# Patient Record
Sex: Female | Born: 1935 | Race: White | Hispanic: No | Smoking: Never smoker
Health system: Southern US, Community
[De-identification: ages and names within clinical notes are randomized; demographics above are authoritative.]

## PROBLEM LIST (undated history)

## (undated) DIAGNOSIS — N189 Chronic kidney disease, unspecified: Secondary | ICD-10-CM

## (undated) DIAGNOSIS — I4821 Permanent atrial fibrillation: Secondary | ICD-10-CM

## (undated) DIAGNOSIS — N184 Chronic kidney disease, stage 4 (severe): Secondary | ICD-10-CM

## (undated) DIAGNOSIS — F32A Depression, unspecified: Secondary | ICD-10-CM

## (undated) DIAGNOSIS — C439 Malignant melanoma of skin, unspecified: Secondary | ICD-10-CM

## (undated) DIAGNOSIS — J189 Pneumonia, unspecified organism: Secondary | ICD-10-CM

## (undated) DIAGNOSIS — I209 Angina pectoris, unspecified: Secondary | ICD-10-CM

## (undated) DIAGNOSIS — I10 Essential (primary) hypertension: Secondary | ICD-10-CM

## (undated) DIAGNOSIS — M109 Gout, unspecified: Secondary | ICD-10-CM

## (undated) DIAGNOSIS — I639 Cerebral infarction, unspecified: Secondary | ICD-10-CM

## (undated) DIAGNOSIS — I872 Venous insufficiency (chronic) (peripheral): Secondary | ICD-10-CM

## (undated) DIAGNOSIS — E1140 Type 2 diabetes mellitus with diabetic neuropathy, unspecified: Secondary | ICD-10-CM

## (undated) DIAGNOSIS — R32 Unspecified urinary incontinence: Secondary | ICD-10-CM

## (undated) DIAGNOSIS — I2510 Atherosclerotic heart disease of native coronary artery without angina pectoris: Secondary | ICD-10-CM

## (undated) DIAGNOSIS — R296 Repeated falls: Secondary | ICD-10-CM

## (undated) DIAGNOSIS — Z9289 Personal history of other medical treatment: Secondary | ICD-10-CM

## (undated) DIAGNOSIS — N179 Acute kidney failure, unspecified: Secondary | ICD-10-CM

## (undated) DIAGNOSIS — Z950 Presence of cardiac pacemaker: Secondary | ICD-10-CM

## (undated) DIAGNOSIS — H35342 Macular cyst, hole, or pseudohole, left eye: Secondary | ICD-10-CM

## (undated) DIAGNOSIS — I428 Other cardiomyopathies: Secondary | ICD-10-CM

## (undated) DIAGNOSIS — I509 Heart failure, unspecified: Secondary | ICD-10-CM

## (undated) DIAGNOSIS — I219 Acute myocardial infarction, unspecified: Secondary | ICD-10-CM

## (undated) DIAGNOSIS — D649 Anemia, unspecified: Secondary | ICD-10-CM

## (undated) DIAGNOSIS — R011 Cardiac murmur, unspecified: Secondary | ICD-10-CM

## (undated) DIAGNOSIS — I4891 Unspecified atrial fibrillation: Secondary | ICD-10-CM

## (undated) DIAGNOSIS — M1990 Unspecified osteoarthritis, unspecified site: Secondary | ICD-10-CM

## (undated) DIAGNOSIS — M419 Scoliosis, unspecified: Secondary | ICD-10-CM

## (undated) DIAGNOSIS — E119 Type 2 diabetes mellitus without complications: Secondary | ICD-10-CM

## (undated) DIAGNOSIS — E785 Hyperlipidemia, unspecified: Secondary | ICD-10-CM

## (undated) DIAGNOSIS — R001 Bradycardia, unspecified: Secondary | ICD-10-CM

## (undated) DIAGNOSIS — F329 Major depressive disorder, single episode, unspecified: Secondary | ICD-10-CM

## (undated) HISTORY — DX: Essential (primary) hypertension: I10

## (undated) HISTORY — DX: Major depressive disorder, single episode, unspecified: F32.9

## (undated) HISTORY — PX: TUBAL LIGATION: SHX77

## (undated) HISTORY — DX: Repeated falls: R29.6

## (undated) HISTORY — DX: Anemia, unspecified: D64.9

## (undated) HISTORY — PX: TONSILLECTOMY: SUR1361

## (undated) HISTORY — DX: Atherosclerotic heart disease of native coronary artery without angina pectoris: I25.10

## (undated) HISTORY — PX: CARDIAC CATHETERIZATION: SHX172

## (undated) HISTORY — DX: Unspecified atrial fibrillation: I48.91

## (undated) HISTORY — DX: Bradycardia, unspecified: R00.1

## (undated) HISTORY — DX: Unspecified urinary incontinence: R32

## (undated) HISTORY — DX: Venous insufficiency (chronic) (peripheral): I87.2

## (undated) HISTORY — PX: BLADDER SUSPENSION: SHX72

## (undated) HISTORY — DX: Heart failure, unspecified: I50.9

## (undated) HISTORY — DX: Chronic kidney disease, unspecified: N18.9

## (undated) HISTORY — DX: Scoliosis, unspecified: M41.9

## (undated) HISTORY — DX: Other cardiomyopathies: I42.8

## (undated) HISTORY — PX: PARS PLANA VITRECTOMY W/ REPAIR OF MACULAR HOLE: SHX2170

## (undated) HISTORY — DX: Unspecified osteoarthritis, unspecified site: M19.90

## (undated) HISTORY — DX: Hyperlipidemia, unspecified: E78.5

## (undated) HISTORY — DX: Depression, unspecified: F32.A

## (undated) HISTORY — DX: Permanent atrial fibrillation: I48.21

## (undated) HISTORY — PX: INSERT / REPLACE / REMOVE PACEMAKER: SUR710

## (undated) HISTORY — PX: CATARACT EXTRACTION W/ INTRAOCULAR LENS  IMPLANT, BILATERAL: SHX1307

## (undated) HISTORY — PX: TOTAL ABDOMINAL HYSTERECTOMY: SHX209

## (undated) HISTORY — DX: Macular cyst, hole, or pseudohole, left eye: H35.342

## (undated) HISTORY — PX: MELANOMA EXCISION: SHX5266

## (undated) HISTORY — PX: APPENDECTOMY: SHX54

## (undated) HISTORY — PX: ANKLE FRACTURE SURGERY: SHX122

## (undated) HISTORY — DX: Type 2 diabetes mellitus without complications: E11.9

---

## 2001-07-28 ENCOUNTER — Encounter: Admission: RE | Admit: 2001-07-28 | Discharge: 2001-07-28 | Payer: Self-pay | Admitting: Family Medicine

## 2001-08-12 ENCOUNTER — Encounter: Admission: RE | Admit: 2001-08-12 | Discharge: 2001-08-12 | Payer: Self-pay | Admitting: Family Medicine

## 2001-08-18 ENCOUNTER — Encounter: Admission: RE | Admit: 2001-08-18 | Discharge: 2001-08-18 | Payer: Self-pay | Admitting: Sports Medicine

## 2001-08-26 ENCOUNTER — Encounter: Admission: RE | Admit: 2001-08-26 | Discharge: 2001-08-26 | Payer: Self-pay | Admitting: Family Medicine

## 2001-08-26 ENCOUNTER — Encounter: Payer: Self-pay | Admitting: Family Medicine

## 2001-09-08 ENCOUNTER — Encounter: Admission: RE | Admit: 2001-09-08 | Discharge: 2001-09-08 | Payer: Self-pay | Admitting: Family Medicine

## 2001-09-08 ENCOUNTER — Ambulatory Visit (HOSPITAL_COMMUNITY): Admission: RE | Admit: 2001-09-08 | Discharge: 2001-09-08 | Payer: Self-pay | Admitting: Family Medicine

## 2001-09-29 ENCOUNTER — Encounter: Admission: RE | Admit: 2001-09-29 | Discharge: 2001-09-29 | Payer: Self-pay | Admitting: Family Medicine

## 2001-11-07 ENCOUNTER — Encounter: Admission: RE | Admit: 2001-11-07 | Discharge: 2001-11-07 | Payer: Self-pay | Admitting: Family Medicine

## 2001-11-14 ENCOUNTER — Encounter: Admission: RE | Admit: 2001-11-14 | Discharge: 2001-11-14 | Payer: Self-pay | Admitting: Family Medicine

## 2001-11-19 ENCOUNTER — Inpatient Hospital Stay (HOSPITAL_COMMUNITY): Admission: EM | Admit: 2001-11-19 | Discharge: 2001-11-20 | Payer: Self-pay | Admitting: Emergency Medicine

## 2001-11-19 ENCOUNTER — Encounter: Payer: Self-pay | Admitting: Emergency Medicine

## 2001-11-20 ENCOUNTER — Encounter: Payer: Self-pay | Admitting: Internal Medicine

## 2001-11-24 ENCOUNTER — Encounter: Admission: RE | Admit: 2001-11-24 | Discharge: 2001-11-24 | Payer: Self-pay | Admitting: Family Medicine

## 2001-12-02 ENCOUNTER — Encounter: Admission: RE | Admit: 2001-12-02 | Discharge: 2001-12-02 | Payer: Self-pay | Admitting: Family Medicine

## 2001-12-08 ENCOUNTER — Encounter: Admission: RE | Admit: 2001-12-08 | Discharge: 2001-12-08 | Payer: Self-pay | Admitting: Family Medicine

## 2001-12-22 ENCOUNTER — Encounter: Admission: RE | Admit: 2001-12-22 | Discharge: 2001-12-22 | Payer: Self-pay | Admitting: Family Medicine

## 2002-01-06 ENCOUNTER — Encounter: Admission: RE | Admit: 2002-01-06 | Discharge: 2002-01-06 | Payer: Self-pay | Admitting: Family Medicine

## 2002-01-19 ENCOUNTER — Encounter: Admission: RE | Admit: 2002-01-19 | Discharge: 2002-01-19 | Payer: Self-pay | Admitting: Family Medicine

## 2002-02-16 ENCOUNTER — Encounter: Admission: RE | Admit: 2002-02-16 | Discharge: 2002-02-16 | Payer: Self-pay | Admitting: Family Medicine

## 2002-03-19 ENCOUNTER — Encounter: Admission: RE | Admit: 2002-03-19 | Discharge: 2002-03-19 | Payer: Self-pay | Admitting: Family Medicine

## 2002-03-27 ENCOUNTER — Encounter: Admission: RE | Admit: 2002-03-27 | Discharge: 2002-03-27 | Payer: Self-pay | Admitting: Family Medicine

## 2002-04-09 ENCOUNTER — Encounter: Admission: RE | Admit: 2002-04-09 | Discharge: 2002-04-09 | Payer: Self-pay | Admitting: Family Medicine

## 2002-04-20 ENCOUNTER — Encounter: Admission: RE | Admit: 2002-04-20 | Discharge: 2002-04-20 | Payer: Self-pay | Admitting: Family Medicine

## 2002-05-08 ENCOUNTER — Encounter: Admission: RE | Admit: 2002-05-08 | Discharge: 2002-05-08 | Payer: Self-pay | Admitting: Family Medicine

## 2002-06-08 ENCOUNTER — Encounter: Admission: RE | Admit: 2002-06-08 | Discharge: 2002-06-08 | Payer: Self-pay | Admitting: Family Medicine

## 2002-06-24 ENCOUNTER — Encounter: Admission: RE | Admit: 2002-06-24 | Discharge: 2002-06-24 | Payer: Self-pay | Admitting: Family Medicine

## 2002-07-09 ENCOUNTER — Encounter: Admission: RE | Admit: 2002-07-09 | Discharge: 2002-07-09 | Payer: Self-pay | Admitting: Family Medicine

## 2002-07-14 ENCOUNTER — Encounter: Payer: Self-pay | Admitting: Emergency Medicine

## 2002-07-14 ENCOUNTER — Inpatient Hospital Stay (HOSPITAL_COMMUNITY): Admission: EM | Admit: 2002-07-14 | Discharge: 2002-07-15 | Payer: Self-pay | Admitting: Emergency Medicine

## 2002-07-14 ENCOUNTER — Encounter: Payer: Self-pay | Admitting: Cardiology

## 2002-07-23 ENCOUNTER — Encounter: Admission: RE | Admit: 2002-07-23 | Discharge: 2002-07-23 | Payer: Self-pay | Admitting: Family Medicine

## 2002-07-30 ENCOUNTER — Encounter: Admission: RE | Admit: 2002-07-30 | Discharge: 2002-07-30 | Payer: Self-pay | Admitting: Family Medicine

## 2002-08-28 ENCOUNTER — Encounter: Admission: RE | Admit: 2002-08-28 | Discharge: 2002-08-28 | Payer: Self-pay | Admitting: Family Medicine

## 2002-09-11 ENCOUNTER — Encounter: Admission: RE | Admit: 2002-09-11 | Discharge: 2002-09-11 | Payer: Self-pay | Admitting: Family Medicine

## 2002-09-28 ENCOUNTER — Encounter: Admission: RE | Admit: 2002-09-28 | Discharge: 2002-09-28 | Payer: Self-pay | Admitting: Family Medicine

## 2002-10-13 ENCOUNTER — Encounter: Payer: Self-pay | Admitting: Sports Medicine

## 2002-10-13 ENCOUNTER — Encounter: Admission: RE | Admit: 2002-10-13 | Discharge: 2002-10-13 | Payer: Self-pay | Admitting: Family Medicine

## 2002-10-13 ENCOUNTER — Encounter: Admission: RE | Admit: 2002-10-13 | Discharge: 2002-10-13 | Payer: Self-pay | Admitting: Sports Medicine

## 2002-10-26 ENCOUNTER — Encounter: Admission: RE | Admit: 2002-10-26 | Discharge: 2002-10-26 | Payer: Self-pay | Admitting: Family Medicine

## 2002-11-02 ENCOUNTER — Encounter: Admission: RE | Admit: 2002-11-02 | Discharge: 2002-11-02 | Payer: Self-pay | Admitting: Family Medicine

## 2002-11-12 ENCOUNTER — Encounter: Admission: RE | Admit: 2002-11-12 | Discharge: 2002-11-12 | Payer: Self-pay | Admitting: Sports Medicine

## 2002-12-21 ENCOUNTER — Encounter: Admission: RE | Admit: 2002-12-21 | Discharge: 2002-12-21 | Payer: Self-pay | Admitting: Sports Medicine

## 2002-12-28 ENCOUNTER — Encounter: Admission: RE | Admit: 2002-12-28 | Discharge: 2002-12-28 | Payer: Self-pay | Admitting: Family Medicine

## 2003-01-04 ENCOUNTER — Encounter: Admission: RE | Admit: 2003-01-04 | Discharge: 2003-01-04 | Payer: Self-pay | Admitting: Family Medicine

## 2003-01-18 ENCOUNTER — Encounter: Admission: RE | Admit: 2003-01-18 | Discharge: 2003-01-18 | Payer: Self-pay | Admitting: Family Medicine

## 2003-02-03 ENCOUNTER — Encounter: Admission: RE | Admit: 2003-02-03 | Discharge: 2003-02-03 | Payer: Self-pay | Admitting: Sports Medicine

## 2003-02-03 ENCOUNTER — Encounter: Admission: RE | Admit: 2003-02-03 | Discharge: 2003-02-03 | Payer: Self-pay | Admitting: Family Medicine

## 2003-02-17 ENCOUNTER — Encounter: Admission: RE | Admit: 2003-02-17 | Discharge: 2003-02-17 | Payer: Self-pay | Admitting: Family Medicine

## 2003-03-01 ENCOUNTER — Encounter: Admission: RE | Admit: 2003-03-01 | Discharge: 2003-03-01 | Payer: Self-pay | Admitting: Family Medicine

## 2003-03-03 ENCOUNTER — Encounter: Admission: RE | Admit: 2003-03-03 | Discharge: 2003-03-03 | Payer: Self-pay | Admitting: Family Medicine

## 2003-03-12 ENCOUNTER — Encounter: Admission: RE | Admit: 2003-03-12 | Discharge: 2003-03-12 | Payer: Self-pay | Admitting: Family Medicine

## 2003-03-19 ENCOUNTER — Encounter: Admission: RE | Admit: 2003-03-19 | Discharge: 2003-03-19 | Payer: Self-pay | Admitting: Family Medicine

## 2003-04-02 ENCOUNTER — Encounter: Admission: RE | Admit: 2003-04-02 | Discharge: 2003-04-02 | Payer: Self-pay | Admitting: Family Medicine

## 2003-04-16 ENCOUNTER — Encounter: Admission: RE | Admit: 2003-04-16 | Discharge: 2003-04-16 | Payer: Self-pay | Admitting: Family Medicine

## 2003-04-30 ENCOUNTER — Encounter: Admission: RE | Admit: 2003-04-30 | Discharge: 2003-04-30 | Payer: Self-pay | Admitting: Sports Medicine

## 2003-05-14 ENCOUNTER — Encounter: Admission: RE | Admit: 2003-05-14 | Discharge: 2003-05-14 | Payer: Self-pay | Admitting: Family Medicine

## 2003-06-14 ENCOUNTER — Encounter: Admission: RE | Admit: 2003-06-14 | Discharge: 2003-06-14 | Payer: Self-pay | Admitting: Family Medicine

## 2003-06-18 ENCOUNTER — Encounter: Admission: RE | Admit: 2003-06-18 | Discharge: 2003-06-18 | Payer: Self-pay | Admitting: Family Medicine

## 2003-07-07 ENCOUNTER — Inpatient Hospital Stay (HOSPITAL_COMMUNITY): Admission: EM | Admit: 2003-07-07 | Discharge: 2003-07-08 | Payer: Self-pay | Admitting: Emergency Medicine

## 2003-07-19 ENCOUNTER — Encounter: Admission: RE | Admit: 2003-07-19 | Discharge: 2003-07-19 | Payer: Self-pay | Admitting: Sports Medicine

## 2003-08-02 ENCOUNTER — Encounter: Admission: RE | Admit: 2003-08-02 | Discharge: 2003-08-02 | Payer: Self-pay | Admitting: Family Medicine

## 2003-08-17 ENCOUNTER — Encounter: Admission: RE | Admit: 2003-08-17 | Discharge: 2003-08-17 | Payer: Self-pay | Admitting: Sports Medicine

## 2003-08-27 ENCOUNTER — Encounter: Admission: RE | Admit: 2003-08-27 | Discharge: 2003-08-27 | Payer: Self-pay | Admitting: Family Medicine

## 2003-09-10 ENCOUNTER — Encounter: Admission: RE | Admit: 2003-09-10 | Discharge: 2003-09-10 | Payer: Self-pay | Admitting: Family Medicine

## 2003-09-24 ENCOUNTER — Encounter: Admission: RE | Admit: 2003-09-24 | Discharge: 2003-09-24 | Payer: Self-pay | Admitting: Family Medicine

## 2003-10-08 ENCOUNTER — Encounter: Admission: RE | Admit: 2003-10-08 | Discharge: 2003-10-08 | Payer: Self-pay | Admitting: Family Medicine

## 2003-10-22 ENCOUNTER — Ambulatory Visit: Payer: Self-pay | Admitting: Family Medicine

## 2003-11-01 ENCOUNTER — Ambulatory Visit: Payer: Self-pay | Admitting: Family Medicine

## 2003-11-22 ENCOUNTER — Ambulatory Visit: Payer: Self-pay | Admitting: Sports Medicine

## 2003-11-29 ENCOUNTER — Ambulatory Visit: Payer: Self-pay | Admitting: Family Medicine

## 2003-12-06 ENCOUNTER — Ambulatory Visit: Payer: Self-pay | Admitting: Family Medicine

## 2003-12-13 ENCOUNTER — Ambulatory Visit: Payer: Self-pay | Admitting: Family Medicine

## 2003-12-27 ENCOUNTER — Ambulatory Visit: Payer: Self-pay | Admitting: Family Medicine

## 2004-01-10 ENCOUNTER — Ambulatory Visit: Payer: Self-pay | Admitting: Family Medicine

## 2004-01-12 ENCOUNTER — Ambulatory Visit: Payer: Self-pay

## 2004-01-19 ENCOUNTER — Ambulatory Visit: Payer: Self-pay | Admitting: Family Medicine

## 2004-01-19 ENCOUNTER — Ambulatory Visit: Payer: Self-pay | Admitting: Internal Medicine

## 2004-02-01 ENCOUNTER — Ambulatory Visit: Payer: Self-pay | Admitting: Family Medicine

## 2004-02-15 ENCOUNTER — Ambulatory Visit: Payer: Self-pay | Admitting: Family Medicine

## 2004-03-03 ENCOUNTER — Ambulatory Visit: Payer: Self-pay | Admitting: Sports Medicine

## 2004-03-21 ENCOUNTER — Ambulatory Visit: Payer: Self-pay | Admitting: Cardiology

## 2004-03-22 ENCOUNTER — Ambulatory Visit: Payer: Self-pay | Admitting: Cardiology

## 2004-04-05 ENCOUNTER — Ambulatory Visit: Payer: Self-pay | Admitting: Sports Medicine

## 2004-05-05 ENCOUNTER — Ambulatory Visit: Payer: Self-pay | Admitting: Family Medicine

## 2004-05-08 ENCOUNTER — Ambulatory Visit: Payer: Self-pay | Admitting: Cardiology

## 2004-05-23 ENCOUNTER — Encounter: Admission: RE | Admit: 2004-05-23 | Discharge: 2004-05-23 | Payer: Self-pay | Admitting: Family Medicine

## 2004-06-02 ENCOUNTER — Ambulatory Visit: Payer: Self-pay | Admitting: Cardiology

## 2004-06-05 ENCOUNTER — Ambulatory Visit: Payer: Self-pay | Admitting: Family Medicine

## 2004-06-19 ENCOUNTER — Ambulatory Visit: Payer: Self-pay | Admitting: Family Medicine

## 2004-07-03 ENCOUNTER — Ambulatory Visit: Payer: Self-pay | Admitting: Family Medicine

## 2004-07-17 ENCOUNTER — Ambulatory Visit: Payer: Self-pay | Admitting: Sports Medicine

## 2004-07-18 ENCOUNTER — Ambulatory Visit: Payer: Self-pay | Admitting: Cardiology

## 2004-07-31 ENCOUNTER — Ambulatory Visit: Payer: Self-pay | Admitting: Sports Medicine

## 2004-08-11 ENCOUNTER — Ambulatory Visit: Payer: Self-pay | Admitting: Cardiology

## 2004-08-14 ENCOUNTER — Encounter: Admission: RE | Admit: 2004-08-14 | Discharge: 2004-08-14 | Payer: Self-pay | Admitting: Family Medicine

## 2004-08-14 ENCOUNTER — Ambulatory Visit: Payer: Self-pay | Admitting: Family Medicine

## 2004-08-21 ENCOUNTER — Ambulatory Visit: Payer: Self-pay

## 2004-09-11 ENCOUNTER — Ambulatory Visit: Payer: Self-pay | Admitting: Sports Medicine

## 2004-09-13 ENCOUNTER — Ambulatory Visit: Payer: Self-pay | Admitting: Cardiology

## 2004-09-25 ENCOUNTER — Ambulatory Visit: Payer: Self-pay | Admitting: Family Medicine

## 2004-10-09 ENCOUNTER — Ambulatory Visit: Payer: Self-pay | Admitting: Sports Medicine

## 2004-10-18 ENCOUNTER — Ambulatory Visit: Payer: Self-pay | Admitting: Cardiology

## 2004-10-30 ENCOUNTER — Ambulatory Visit: Payer: Self-pay | Admitting: Sports Medicine

## 2004-11-10 ENCOUNTER — Ambulatory Visit: Payer: Self-pay | Admitting: Family Medicine

## 2004-11-15 ENCOUNTER — Ambulatory Visit: Payer: Self-pay | Admitting: Cardiology

## 2004-12-07 ENCOUNTER — Ambulatory Visit: Payer: Self-pay | Admitting: Family Medicine

## 2004-12-14 ENCOUNTER — Ambulatory Visit: Payer: Self-pay | Admitting: Cardiology

## 2004-12-22 ENCOUNTER — Ambulatory Visit: Payer: Self-pay | Admitting: Family Medicine

## 2005-01-16 ENCOUNTER — Ambulatory Visit: Payer: Self-pay | Admitting: Cardiology

## 2005-01-16 ENCOUNTER — Ambulatory Visit: Payer: Self-pay | Admitting: Sports Medicine

## 2005-01-30 ENCOUNTER — Ambulatory Visit: Payer: Self-pay | Admitting: Sports Medicine

## 2005-02-19 ENCOUNTER — Ambulatory Visit: Payer: Self-pay | Admitting: Cardiology

## 2005-02-27 ENCOUNTER — Ambulatory Visit: Payer: Self-pay | Admitting: Family Medicine

## 2005-03-13 ENCOUNTER — Ambulatory Visit: Payer: Self-pay | Admitting: Sports Medicine

## 2005-03-27 ENCOUNTER — Ambulatory Visit: Payer: Self-pay | Admitting: Cardiology

## 2005-04-10 ENCOUNTER — Ambulatory Visit: Payer: Self-pay | Admitting: Sports Medicine

## 2005-04-25 ENCOUNTER — Ambulatory Visit: Payer: Self-pay | Admitting: Cardiology

## 2005-04-25 ENCOUNTER — Ambulatory Visit: Payer: Self-pay

## 2005-04-25 ENCOUNTER — Inpatient Hospital Stay (HOSPITAL_COMMUNITY): Admission: AD | Admit: 2005-04-25 | Discharge: 2005-04-28 | Payer: Self-pay | Admitting: Cardiology

## 2005-05-09 ENCOUNTER — Ambulatory Visit: Payer: Self-pay

## 2005-05-09 ENCOUNTER — Ambulatory Visit: Payer: Self-pay | Admitting: Cardiology

## 2005-05-21 ENCOUNTER — Ambulatory Visit: Payer: Self-pay | Admitting: Family Medicine

## 2005-06-25 ENCOUNTER — Ambulatory Visit: Payer: Self-pay | Admitting: Sports Medicine

## 2005-07-10 ENCOUNTER — Ambulatory Visit: Payer: Self-pay | Admitting: Sports Medicine

## 2005-07-24 ENCOUNTER — Ambulatory Visit: Payer: Self-pay | Admitting: Sports Medicine

## 2005-07-27 ENCOUNTER — Inpatient Hospital Stay (HOSPITAL_COMMUNITY): Admission: EM | Admit: 2005-07-27 | Discharge: 2005-08-03 | Payer: Self-pay | Admitting: *Deleted

## 2005-07-27 ENCOUNTER — Ambulatory Visit: Payer: Self-pay | Admitting: Cardiovascular Disease

## 2005-07-31 ENCOUNTER — Encounter (INDEPENDENT_AMBULATORY_CARE_PROVIDER_SITE_OTHER): Payer: Self-pay | Admitting: *Deleted

## 2005-08-06 ENCOUNTER — Ambulatory Visit: Payer: Self-pay | Admitting: Cardiology

## 2005-08-08 ENCOUNTER — Ambulatory Visit: Payer: Self-pay | Admitting: Cardiology

## 2005-08-10 ENCOUNTER — Ambulatory Visit: Payer: Self-pay | Admitting: Family Medicine

## 2005-08-14 ENCOUNTER — Ambulatory Visit: Payer: Self-pay | Admitting: Sports Medicine

## 2005-08-17 ENCOUNTER — Ambulatory Visit: Payer: Self-pay | Admitting: Family Medicine

## 2005-08-24 ENCOUNTER — Ambulatory Visit: Payer: Self-pay | Admitting: Family Medicine

## 2005-09-07 ENCOUNTER — Ambulatory Visit: Payer: Self-pay | Admitting: Cardiology

## 2005-09-14 ENCOUNTER — Ambulatory Visit: Payer: Self-pay | Admitting: Internal Medicine

## 2005-09-14 ENCOUNTER — Ambulatory Visit: Payer: Self-pay | Admitting: Cardiology

## 2005-09-14 ENCOUNTER — Ambulatory Visit: Payer: Self-pay | Admitting: Sports Medicine

## 2005-09-17 ENCOUNTER — Ambulatory Visit: Payer: Self-pay | Admitting: Family Medicine

## 2005-09-24 ENCOUNTER — Ambulatory Visit: Payer: Self-pay | Admitting: Family Medicine

## 2005-10-02 ENCOUNTER — Ambulatory Visit: Payer: Self-pay | Admitting: Sports Medicine

## 2005-10-03 ENCOUNTER — Ambulatory Visit: Payer: Self-pay | Admitting: Family Medicine

## 2005-10-03 ENCOUNTER — Encounter: Admission: RE | Admit: 2005-10-03 | Discharge: 2005-10-03 | Payer: Self-pay | Admitting: Sports Medicine

## 2005-10-05 ENCOUNTER — Ambulatory Visit: Payer: Self-pay | Admitting: Cardiology

## 2005-10-26 ENCOUNTER — Ambulatory Visit: Payer: Self-pay | Admitting: Family Medicine

## 2005-11-09 ENCOUNTER — Ambulatory Visit: Payer: Self-pay | Admitting: Family Medicine

## 2005-11-23 ENCOUNTER — Ambulatory Visit: Payer: Self-pay | Admitting: Family Medicine

## 2005-11-30 ENCOUNTER — Ambulatory Visit: Payer: Self-pay | Admitting: Family Medicine

## 2005-12-10 ENCOUNTER — Ambulatory Visit: Payer: Self-pay | Admitting: Cardiology

## 2005-12-10 ENCOUNTER — Encounter: Admission: RE | Admit: 2005-12-10 | Discharge: 2005-12-10 | Payer: Self-pay | Admitting: Sports Medicine

## 2005-12-10 ENCOUNTER — Ambulatory Visit: Payer: Self-pay | Admitting: Family Medicine

## 2005-12-11 ENCOUNTER — Ambulatory Visit: Payer: Self-pay | Admitting: Family Medicine

## 2005-12-24 ENCOUNTER — Ambulatory Visit: Payer: Self-pay | Admitting: Family Medicine

## 2005-12-27 ENCOUNTER — Ambulatory Visit: Payer: Self-pay | Admitting: Sports Medicine

## 2006-01-02 ENCOUNTER — Ambulatory Visit: Payer: Self-pay | Admitting: Family Medicine

## 2006-01-07 ENCOUNTER — Ambulatory Visit: Payer: Self-pay | Admitting: Family Medicine

## 2006-01-11 ENCOUNTER — Ambulatory Visit: Payer: Self-pay | Admitting: Internal Medicine

## 2006-01-16 ENCOUNTER — Ambulatory Visit: Payer: Self-pay | Admitting: Family Medicine

## 2006-01-30 ENCOUNTER — Ambulatory Visit: Payer: Self-pay | Admitting: Family Medicine

## 2006-02-13 ENCOUNTER — Ambulatory Visit: Payer: Self-pay | Admitting: Sports Medicine

## 2006-02-27 ENCOUNTER — Ambulatory Visit: Payer: Self-pay | Admitting: Sports Medicine

## 2006-03-04 ENCOUNTER — Ambulatory Visit: Payer: Self-pay | Admitting: Cardiology

## 2006-03-13 ENCOUNTER — Ambulatory Visit: Payer: Self-pay | Admitting: Family Medicine

## 2006-03-27 ENCOUNTER — Ambulatory Visit: Payer: Self-pay | Admitting: Family Medicine

## 2006-04-03 ENCOUNTER — Ambulatory Visit: Payer: Self-pay | Admitting: Family Medicine

## 2006-04-11 DIAGNOSIS — I8310 Varicose veins of unspecified lower extremity with inflammation: Secondary | ICD-10-CM

## 2006-04-11 DIAGNOSIS — R32 Unspecified urinary incontinence: Secondary | ICD-10-CM

## 2006-04-11 DIAGNOSIS — E7800 Pure hypercholesterolemia, unspecified: Secondary | ICD-10-CM

## 2006-04-11 DIAGNOSIS — I252 Old myocardial infarction: Secondary | ICD-10-CM

## 2006-04-11 DIAGNOSIS — I5022 Chronic systolic (congestive) heart failure: Secondary | ICD-10-CM

## 2006-04-11 DIAGNOSIS — I6789 Other cerebrovascular disease: Secondary | ICD-10-CM

## 2006-04-11 DIAGNOSIS — M1990 Unspecified osteoarthritis, unspecified site: Secondary | ICD-10-CM

## 2006-04-11 DIAGNOSIS — I10 Essential (primary) hypertension: Secondary | ICD-10-CM | POA: Insufficient documentation

## 2006-04-11 DIAGNOSIS — I2510 Atherosclerotic heart disease of native coronary artery without angina pectoris: Secondary | ICD-10-CM | POA: Insufficient documentation

## 2006-04-11 DIAGNOSIS — E118 Type 2 diabetes mellitus with unspecified complications: Secondary | ICD-10-CM | POA: Insufficient documentation

## 2006-04-11 DIAGNOSIS — E781 Pure hyperglyceridemia: Secondary | ICD-10-CM

## 2006-04-11 DIAGNOSIS — F329 Major depressive disorder, single episode, unspecified: Secondary | ICD-10-CM

## 2006-04-18 ENCOUNTER — Ambulatory Visit: Payer: Self-pay | Admitting: Family Medicine

## 2006-04-18 DIAGNOSIS — I4891 Unspecified atrial fibrillation: Secondary | ICD-10-CM | POA: Insufficient documentation

## 2006-05-02 ENCOUNTER — Ambulatory Visit: Payer: Self-pay | Admitting: Family Medicine

## 2006-05-02 LAB — CONVERTED CEMR LAB
INR: 2.8
Prothrombin Time: 34.1 s

## 2006-05-17 ENCOUNTER — Ambulatory Visit: Payer: Self-pay | Admitting: Family Medicine

## 2006-05-17 DIAGNOSIS — M5137 Other intervertebral disc degeneration, lumbosacral region: Secondary | ICD-10-CM

## 2006-05-17 DIAGNOSIS — M4120 Other idiopathic scoliosis, site unspecified: Secondary | ICD-10-CM | POA: Insufficient documentation

## 2006-05-17 LAB — CONVERTED CEMR LAB
BUN: 20 mg/dL (ref 6–23)
Calcium: 9.4 mg/dL (ref 8.4–10.5)
Glucose, Bld: 88 mg/dL (ref 70–99)
Hgb A1c MFr Bld: 6.2 %
INR: 2.9
Potassium: 3.4 meq/L — ABNORMAL LOW (ref 3.5–5.3)
Sodium: 145 meq/L (ref 135–145)

## 2006-05-27 ENCOUNTER — Ambulatory Visit: Payer: Self-pay | Admitting: Cardiology

## 2006-05-28 ENCOUNTER — Telehealth (INDEPENDENT_AMBULATORY_CARE_PROVIDER_SITE_OTHER): Payer: Self-pay | Admitting: *Deleted

## 2006-06-13 ENCOUNTER — Encounter: Payer: Self-pay | Admitting: Family Medicine

## 2006-06-13 ENCOUNTER — Ambulatory Visit: Payer: Self-pay | Admitting: Sports Medicine

## 2006-06-13 DIAGNOSIS — D638 Anemia in other chronic diseases classified elsewhere: Secondary | ICD-10-CM

## 2006-06-14 ENCOUNTER — Ambulatory Visit: Payer: Self-pay | Admitting: Cardiology

## 2006-06-14 ENCOUNTER — Ambulatory Visit (HOSPITAL_COMMUNITY): Admission: RE | Admit: 2006-06-14 | Discharge: 2006-06-14 | Payer: Self-pay | Admitting: Family Medicine

## 2006-06-14 ENCOUNTER — Telehealth: Payer: Self-pay | Admitting: Family Medicine

## 2006-06-14 ENCOUNTER — Encounter (INDEPENDENT_AMBULATORY_CARE_PROVIDER_SITE_OTHER): Payer: Self-pay | Admitting: *Deleted

## 2006-06-14 LAB — CONVERTED CEMR LAB
BUN: 21 mg/dL (ref 6–23)
CO2: 19 meq/L (ref 19–32)
Calcium: 8.8 mg/dL (ref 8.4–10.5)
Digitoxin Lvl: 1.5 ng/mL (ref 0.8–2.0)
Hemoglobin: 8.6 g/dL — ABNORMAL LOW (ref 12.0–15.0)
MCHC: 30 g/dL (ref 30.0–36.0)
MCV: 88.3 fL (ref 78.0–100.0)
Platelets: 200 10*3/uL (ref 150–400)
Potassium: 3.9 meq/L (ref 3.5–5.3)
RBC: 3.25 M/uL — ABNORMAL LOW (ref 3.87–5.11)
RDW: 16.1 % — ABNORMAL HIGH (ref 11.5–14.0)
Sodium: 144 meq/L (ref 135–145)

## 2006-06-18 ENCOUNTER — Ambulatory Visit: Payer: Self-pay

## 2006-06-18 ENCOUNTER — Ambulatory Visit: Payer: Self-pay | Admitting: Cardiology

## 2006-06-18 ENCOUNTER — Encounter: Payer: Self-pay | Admitting: Internal Medicine

## 2006-06-18 LAB — CONVERTED CEMR LAB
CO2: 27 meq/L (ref 19–32)
Creatinine, Ser: 1.5 mg/dL — ABNORMAL HIGH (ref 0.4–1.2)
GFR calc non Af Amer: 36 mL/min
Sodium: 142 meq/L (ref 135–145)

## 2006-06-24 ENCOUNTER — Encounter: Payer: Self-pay | Admitting: Family Medicine

## 2006-06-24 ENCOUNTER — Ambulatory Visit: Payer: Self-pay | Admitting: Family Medicine

## 2006-07-01 ENCOUNTER — Telehealth: Payer: Self-pay | Admitting: Family Medicine

## 2006-07-10 ENCOUNTER — Ambulatory Visit: Payer: Self-pay | Admitting: Cardiology

## 2006-07-10 ENCOUNTER — Telehealth: Payer: Self-pay | Admitting: Family Medicine

## 2006-07-15 ENCOUNTER — Telehealth: Payer: Self-pay | Admitting: Family Medicine

## 2006-07-17 ENCOUNTER — Ambulatory Visit: Payer: Self-pay | Admitting: Family Medicine

## 2006-07-17 LAB — CONVERTED CEMR LAB
Chloride: 101 meq/L (ref 96–112)
Creatinine, Ser: 1.5 mg/dL — ABNORMAL HIGH (ref 0.40–1.20)
Hemoglobin: 9.9 g/dL
Sodium: 140 meq/L (ref 135–145)

## 2006-07-22 ENCOUNTER — Telehealth: Payer: Self-pay | Admitting: *Deleted

## 2006-07-31 ENCOUNTER — Ambulatory Visit: Payer: Self-pay | Admitting: Family Medicine

## 2006-08-07 ENCOUNTER — Ambulatory Visit: Payer: Self-pay | Admitting: Family Medicine

## 2006-08-07 LAB — CONVERTED CEMR LAB: INR: 3

## 2006-08-21 ENCOUNTER — Ambulatory Visit: Payer: Self-pay | Admitting: Family Medicine

## 2006-09-04 ENCOUNTER — Ambulatory Visit: Payer: Self-pay | Admitting: Family Medicine

## 2006-09-04 LAB — CONVERTED CEMR LAB: INR: 2

## 2006-09-18 ENCOUNTER — Ambulatory Visit: Payer: Self-pay | Admitting: Family Medicine

## 2006-09-18 LAB — CONVERTED CEMR LAB: INR: 2.1

## 2006-10-07 ENCOUNTER — Ambulatory Visit: Payer: Self-pay | Admitting: Cardiology

## 2006-10-21 ENCOUNTER — Ambulatory Visit: Payer: Self-pay | Admitting: Family Medicine

## 2006-10-21 DIAGNOSIS — Z85820 Personal history of malignant melanoma of skin: Secondary | ICD-10-CM

## 2006-10-21 LAB — CONVERTED CEMR LAB
Hgb A1c MFr Bld: 6.3 %
INR: 3.1

## 2006-10-22 LAB — CONVERTED CEMR LAB
ALT: 13 units/L (ref 0–35)
Albumin: 4.2 g/dL (ref 3.5–5.2)
CO2: 23 meq/L (ref 19–32)
Chloride: 102 meq/L (ref 96–112)
Cholesterol: 218 mg/dL — ABNORMAL HIGH (ref 0–200)
MCV: 89.3 fL (ref 78.0–100.0)
Platelets: 266 10*3/uL (ref 150–400)
Potassium: 3.7 meq/L (ref 3.5–5.3)
RBC: 3.83 M/uL — ABNORMAL LOW (ref 3.87–5.11)
Sodium: 140 meq/L (ref 135–145)
Total Bilirubin: 0.5 mg/dL (ref 0.3–1.2)
Total Protein: 7.4 g/dL (ref 6.0–8.3)
WBC: 6.6 10*3/uL (ref 4.0–10.5)

## 2006-11-06 ENCOUNTER — Ambulatory Visit: Payer: Self-pay | Admitting: Family Medicine

## 2006-11-13 ENCOUNTER — Encounter: Payer: Self-pay | Admitting: Family Medicine

## 2006-11-14 ENCOUNTER — Telehealth: Payer: Self-pay | Admitting: *Deleted

## 2006-11-20 ENCOUNTER — Ambulatory Visit: Payer: Self-pay | Admitting: Cardiology

## 2006-11-29 ENCOUNTER — Encounter (HOSPITAL_BASED_OUTPATIENT_CLINIC_OR_DEPARTMENT_OTHER): Payer: Self-pay | Admitting: General Surgery

## 2006-11-29 ENCOUNTER — Ambulatory Visit (HOSPITAL_BASED_OUTPATIENT_CLINIC_OR_DEPARTMENT_OTHER): Admission: RE | Admit: 2006-11-29 | Discharge: 2006-11-29 | Payer: Self-pay | Admitting: General Surgery

## 2006-12-09 ENCOUNTER — Ambulatory Visit: Payer: Self-pay | Admitting: Sports Medicine

## 2006-12-09 LAB — CONVERTED CEMR LAB: INR: 1.7

## 2006-12-13 ENCOUNTER — Encounter: Payer: Self-pay | Admitting: Family Medicine

## 2006-12-30 ENCOUNTER — Ambulatory Visit: Payer: Self-pay | Admitting: Cardiology

## 2007-01-02 ENCOUNTER — Telehealth: Payer: Self-pay | Admitting: Family Medicine

## 2007-01-20 ENCOUNTER — Ambulatory Visit: Payer: Self-pay | Admitting: Family Medicine

## 2007-01-20 DIAGNOSIS — H9190 Unspecified hearing loss, unspecified ear: Secondary | ICD-10-CM | POA: Insufficient documentation

## 2007-01-20 LAB — CONVERTED CEMR LAB
Hgb A1c MFr Bld: 6.5 %
INR: 3.4

## 2007-02-04 ENCOUNTER — Ambulatory Visit: Payer: Self-pay | Admitting: Sports Medicine

## 2007-02-04 ENCOUNTER — Encounter: Payer: Self-pay | Admitting: Family Medicine

## 2007-02-04 LAB — CONVERTED CEMR LAB
BUN: 22 mg/dL (ref 6–23)
Calcium: 8.8 mg/dL (ref 8.4–10.5)
Glucose, Bld: 109 mg/dL — ABNORMAL HIGH (ref 70–99)
Sodium: 145 meq/L (ref 135–145)
Triglycerides: 217 mg/dL — ABNORMAL HIGH (ref <150)

## 2007-02-07 ENCOUNTER — Telehealth: Payer: Self-pay | Admitting: Family Medicine

## 2007-02-26 ENCOUNTER — Ambulatory Visit: Payer: Self-pay | Admitting: Family Medicine

## 2007-03-10 ENCOUNTER — Ambulatory Visit: Payer: Self-pay | Admitting: Family Medicine

## 2007-03-10 ENCOUNTER — Telehealth: Payer: Self-pay | Admitting: Family Medicine

## 2007-03-10 DIAGNOSIS — E1149 Type 2 diabetes mellitus with other diabetic neurological complication: Secondary | ICD-10-CM

## 2007-03-10 LAB — CONVERTED CEMR LAB: INR: 2.1

## 2007-03-24 ENCOUNTER — Encounter: Payer: Self-pay | Admitting: Family Medicine

## 2007-03-27 ENCOUNTER — Ambulatory Visit: Payer: Self-pay | Admitting: Family Medicine

## 2007-04-01 ENCOUNTER — Ambulatory Visit: Payer: Self-pay | Admitting: Cardiology

## 2007-04-02 ENCOUNTER — Telehealth: Payer: Self-pay | Admitting: *Deleted

## 2007-04-03 ENCOUNTER — Encounter: Admission: RE | Admit: 2007-04-03 | Discharge: 2007-04-03 | Payer: Self-pay | Admitting: Family Medicine

## 2007-04-03 ENCOUNTER — Ambulatory Visit: Payer: Self-pay | Admitting: Family Medicine

## 2007-04-10 ENCOUNTER — Encounter: Payer: Self-pay | Admitting: Family Medicine

## 2007-04-28 ENCOUNTER — Ambulatory Visit: Payer: Self-pay | Admitting: Family Medicine

## 2007-04-28 LAB — CONVERTED CEMR LAB
BUN: 23 mg/dL (ref 6–23)
Calcium: 9.4 mg/dL (ref 8.4–10.5)
Creatinine, Ser: 1.66 mg/dL — ABNORMAL HIGH (ref 0.40–1.20)
HCT: 29.4 % — ABNORMAL LOW (ref 36.0–46.0)
Hemoglobin: 8.8 g/dL — ABNORMAL LOW (ref 12.0–15.0)
MCHC: 29.9 g/dL — ABNORMAL LOW (ref 30.0–36.0)
RDW: 16.1 % — ABNORMAL HIGH (ref 11.5–15.5)

## 2007-04-29 ENCOUNTER — Telehealth: Payer: Self-pay | Admitting: Family Medicine

## 2007-05-26 ENCOUNTER — Encounter: Payer: Self-pay | Admitting: Family Medicine

## 2007-05-27 ENCOUNTER — Encounter: Payer: Self-pay | Admitting: Family Medicine

## 2007-05-27 ENCOUNTER — Encounter: Admission: RE | Admit: 2007-05-27 | Discharge: 2007-05-27 | Payer: Self-pay | Admitting: *Deleted

## 2007-05-27 ENCOUNTER — Encounter (INDEPENDENT_AMBULATORY_CARE_PROVIDER_SITE_OTHER): Payer: Self-pay | Admitting: *Deleted

## 2007-05-27 ENCOUNTER — Ambulatory Visit: Payer: Self-pay | Admitting: Family Medicine

## 2007-05-27 LAB — CONVERTED CEMR LAB
Albumin: 3.9 g/dL (ref 3.5–5.2)
Alkaline Phosphatase: 95 units/L (ref 39–117)
CO2: 22 meq/L (ref 19–32)
Glucose, Bld: 144 mg/dL — ABNORMAL HIGH (ref 70–99)
Potassium: 4.1 meq/L (ref 3.5–5.3)
Pro B Natriuretic peptide (BNP): 939 pg/mL — ABNORMAL HIGH (ref 0.0–100.0)
RBC: 2.98 M/uL — ABNORMAL LOW (ref 3.87–5.11)
Sodium: 143 meq/L (ref 135–145)
Total Protein: 7.4 g/dL (ref 6.0–8.3)
WBC: 12.1 10*3/uL — ABNORMAL HIGH (ref 4.0–10.5)

## 2007-05-29 ENCOUNTER — Telehealth: Payer: Self-pay | Admitting: *Deleted

## 2007-05-30 ENCOUNTER — Inpatient Hospital Stay (HOSPITAL_COMMUNITY): Admission: AD | Admit: 2007-05-30 | Discharge: 2007-06-04 | Payer: Self-pay | Admitting: Family Medicine

## 2007-05-30 ENCOUNTER — Ambulatory Visit: Payer: Self-pay | Admitting: Family Medicine

## 2007-06-06 ENCOUNTER — Ambulatory Visit: Payer: Self-pay | Admitting: Family Medicine

## 2007-06-06 DIAGNOSIS — N183 Chronic kidney disease, stage 3 (moderate): Secondary | ICD-10-CM

## 2007-06-06 LAB — CONVERTED CEMR LAB
CO2: 25 meq/L (ref 19–32)
Calcium: 9.2 mg/dL (ref 8.4–10.5)
MCV: 87.2 fL (ref 78.0–100.0)
Platelets: 236 10*3/uL (ref 150–400)
Sodium: 143 meq/L (ref 135–145)
WBC: 7.6 10*3/uL (ref 4.0–10.5)

## 2007-07-01 ENCOUNTER — Ambulatory Visit: Payer: Self-pay | Admitting: Cardiology

## 2007-07-04 ENCOUNTER — Ambulatory Visit: Payer: Self-pay | Admitting: Family Medicine

## 2007-07-11 ENCOUNTER — Ambulatory Visit: Payer: Self-pay | Admitting: Family Medicine

## 2007-07-11 LAB — CONVERTED CEMR LAB
BUN: 25 mg/dL — ABNORMAL HIGH (ref 6–23)
CO2: 25 meq/L (ref 19–32)
Chloride: 105 meq/L (ref 96–112)
Glucose, Bld: 113 mg/dL — ABNORMAL HIGH (ref 70–99)
Hemoglobin: 10.7 g/dL — ABNORMAL LOW (ref 12.0–15.0)
MCHC: 31.7 g/dL (ref 30.0–36.0)
Potassium: 4.6 meq/L (ref 3.5–5.3)
RBC: 3.95 M/uL (ref 3.87–5.11)
WBC: 8.4 10*3/uL (ref 4.0–10.5)

## 2007-09-17 ENCOUNTER — Ambulatory Visit: Payer: Self-pay | Admitting: Family Medicine

## 2007-09-17 LAB — CONVERTED CEMR LAB
BUN: 26 mg/dL — ABNORMAL HIGH (ref 6–23)
Calcium: 9.1 mg/dL (ref 8.4–10.5)
Creatinine, Ser: 1.96 mg/dL — ABNORMAL HIGH (ref 0.40–1.20)
Direct LDL: 61 mg/dL
HCT: 30.6 % — ABNORMAL LOW (ref 36.0–46.0)
Hgb A1c MFr Bld: 6 %
MCHC: 31 g/dL (ref 30.0–36.0)
MCV: 95 fL (ref 78.0–100.0)
Platelets: 254 10*3/uL (ref 150–400)
RDW: 15.8 % — ABNORMAL HIGH (ref 11.5–15.5)

## 2007-10-13 ENCOUNTER — Ambulatory Visit: Payer: Self-pay | Admitting: Cardiology

## 2007-10-14 ENCOUNTER — Ambulatory Visit: Payer: Self-pay | Admitting: Cardiology

## 2007-11-03 ENCOUNTER — Ambulatory Visit: Payer: Self-pay | Admitting: Cardiology

## 2007-11-12 ENCOUNTER — Ambulatory Visit: Payer: Self-pay | Admitting: Family Medicine

## 2007-12-01 ENCOUNTER — Encounter: Payer: Self-pay | Admitting: *Deleted

## 2007-12-01 ENCOUNTER — Ambulatory Visit: Payer: Self-pay | Admitting: Family Medicine

## 2007-12-02 LAB — CONVERTED CEMR LAB
ALT: 10 units/L (ref 0–35)
AST: 14 units/L (ref 0–37)
Albumin: 4.2 g/dL (ref 3.5–5.2)
Alkaline Phosphatase: 82 units/L (ref 39–117)
Calcium: 9.6 mg/dL (ref 8.4–10.5)
Chloride: 97 meq/L (ref 96–112)
Creatinine, Ser: 1.76 mg/dL — ABNORMAL HIGH (ref 0.40–1.20)
HDL: 35 mg/dL — ABNORMAL LOW (ref >39)
MCHC: 31.3 g/dL (ref 30.0–36.0)
Platelets: 213 10*3/uL (ref 150–400)
Potassium: 4.1 meq/L (ref 3.5–5.3)
RDW: 15.3 % (ref 11.5–15.5)

## 2007-12-04 ENCOUNTER — Ambulatory Visit: Payer: Self-pay | Admitting: Internal Medicine

## 2007-12-10 ENCOUNTER — Telehealth: Payer: Self-pay | Admitting: Family Medicine

## 2007-12-17 ENCOUNTER — Telehealth: Payer: Self-pay | Admitting: Internal Medicine

## 2007-12-18 ENCOUNTER — Ambulatory Visit: Payer: Self-pay | Admitting: Internal Medicine

## 2007-12-18 DIAGNOSIS — K5730 Diverticulosis of large intestine without perforation or abscess without bleeding: Secondary | ICD-10-CM | POA: Insufficient documentation

## 2008-02-02 ENCOUNTER — Ambulatory Visit: Payer: Self-pay | Admitting: Cardiology

## 2008-04-12 ENCOUNTER — Telehealth: Payer: Self-pay | Admitting: Family Medicine

## 2008-04-30 ENCOUNTER — Encounter: Payer: Self-pay | Admitting: Family Medicine

## 2008-05-03 ENCOUNTER — Ambulatory Visit: Payer: Self-pay | Admitting: Cardiology

## 2008-05-07 ENCOUNTER — Ambulatory Visit: Payer: Self-pay | Admitting: Family Medicine

## 2008-05-07 LAB — CONVERTED CEMR LAB
Alkaline Phosphatase: 87 units/L (ref 39–117)
BUN: 49 mg/dL — ABNORMAL HIGH (ref 6–23)
Glucose, Bld: 149 mg/dL — ABNORMAL HIGH (ref 70–99)
Hemoglobin: 12.2 g/dL (ref 12.0–15.0)
MCHC: 34 g/dL (ref 30.0–36.0)
MCV: 90.9 fL (ref 78.0–100.0)
RBC: 3.95 M/uL (ref 3.87–5.11)
Sodium: 138 meq/L (ref 135–145)
Total Bilirubin: 0.5 mg/dL (ref 0.3–1.2)

## 2008-05-17 ENCOUNTER — Telehealth: Payer: Self-pay | Admitting: Family Medicine

## 2008-06-04 ENCOUNTER — Encounter (INDEPENDENT_AMBULATORY_CARE_PROVIDER_SITE_OTHER): Payer: Self-pay | Admitting: Radiology

## 2008-06-10 ENCOUNTER — Encounter: Payer: Self-pay | Admitting: Family Medicine

## 2008-07-27 ENCOUNTER — Telehealth: Payer: Self-pay | Admitting: Family Medicine

## 2008-07-29 ENCOUNTER — Ambulatory Visit: Payer: Self-pay | Admitting: Family Medicine

## 2008-07-29 ENCOUNTER — Encounter: Admission: RE | Admit: 2008-07-29 | Discharge: 2008-07-29 | Payer: Self-pay | Admitting: Family Medicine

## 2008-07-29 ENCOUNTER — Encounter: Payer: Self-pay | Admitting: Family Medicine

## 2008-07-29 DIAGNOSIS — M542 Cervicalgia: Secondary | ICD-10-CM

## 2008-07-29 DIAGNOSIS — R252 Cramp and spasm: Secondary | ICD-10-CM | POA: Insufficient documentation

## 2008-07-29 LAB — CONVERTED CEMR LAB
CO2: 30 meq/L (ref 19–32)
Glucose, Bld: 208 mg/dL — ABNORMAL HIGH (ref 70–99)
Potassium: 3.8 meq/L (ref 3.5–5.3)
Sodium: 142 meq/L (ref 135–145)

## 2008-07-30 ENCOUNTER — Encounter: Payer: Self-pay | Admitting: Family Medicine

## 2008-08-02 ENCOUNTER — Ambulatory Visit: Payer: Self-pay | Admitting: Cardiology

## 2008-08-09 ENCOUNTER — Telehealth (INDEPENDENT_AMBULATORY_CARE_PROVIDER_SITE_OTHER): Payer: Self-pay | Admitting: *Deleted

## 2008-08-09 ENCOUNTER — Telehealth: Payer: Self-pay | Admitting: Cardiology

## 2008-08-10 ENCOUNTER — Telehealth (INDEPENDENT_AMBULATORY_CARE_PROVIDER_SITE_OTHER): Payer: Self-pay | Admitting: *Deleted

## 2008-08-11 ENCOUNTER — Encounter: Payer: Self-pay | Admitting: Cardiology

## 2008-08-11 ENCOUNTER — Ambulatory Visit: Payer: Self-pay

## 2008-08-11 ENCOUNTER — Ambulatory Visit: Payer: Self-pay | Admitting: Internal Medicine

## 2008-08-18 ENCOUNTER — Ambulatory Visit: Payer: Self-pay | Admitting: Family Medicine

## 2008-08-18 LAB — CONVERTED CEMR LAB
BUN: 80 mg/dL — ABNORMAL HIGH (ref 6–23)
Chloride: 94 meq/L — ABNORMAL LOW (ref 96–112)
Glucose, Bld: 274 mg/dL — ABNORMAL HIGH (ref 70–99)
Potassium: 3.8 meq/L (ref 3.5–5.3)

## 2008-09-22 ENCOUNTER — Ambulatory Visit: Payer: Self-pay | Admitting: Family Medicine

## 2008-09-22 DIAGNOSIS — H3530 Unspecified macular degeneration: Secondary | ICD-10-CM | POA: Insufficient documentation

## 2008-09-23 LAB — CONVERTED CEMR LAB
BUN: 38 mg/dL — ABNORMAL HIGH (ref 6–23)
Calcium: 9.5 mg/dL (ref 8.4–10.5)
Glucose, Bld: 170 mg/dL — ABNORMAL HIGH (ref 70–99)
Magnesium: 2.2 mg/dL (ref 1.5–2.5)

## 2008-10-14 DIAGNOSIS — Z950 Presence of cardiac pacemaker: Secondary | ICD-10-CM

## 2008-10-14 DIAGNOSIS — I5032 Chronic diastolic (congestive) heart failure: Secondary | ICD-10-CM | POA: Insufficient documentation

## 2008-10-15 ENCOUNTER — Ambulatory Visit: Payer: Self-pay | Admitting: Cardiology

## 2008-10-15 ENCOUNTER — Encounter: Payer: Self-pay | Admitting: Family Medicine

## 2008-10-22 ENCOUNTER — Ambulatory Visit (HOSPITAL_COMMUNITY): Admission: RE | Admit: 2008-10-22 | Discharge: 2008-10-22 | Payer: Self-pay | Admitting: Family Medicine

## 2008-10-22 ENCOUNTER — Ambulatory Visit: Payer: Self-pay | Admitting: Internal Medicine

## 2008-10-22 ENCOUNTER — Ambulatory Visit: Payer: Self-pay | Admitting: Family Medicine

## 2008-10-22 ENCOUNTER — Inpatient Hospital Stay (HOSPITAL_COMMUNITY): Admission: AD | Admit: 2008-10-22 | Discharge: 2008-10-26 | Payer: Self-pay | Admitting: Family Medicine

## 2008-10-23 ENCOUNTER — Encounter: Payer: Self-pay | Admitting: Internal Medicine

## 2008-10-23 ENCOUNTER — Encounter: Payer: Self-pay | Admitting: Family Medicine

## 2008-10-23 ENCOUNTER — Ambulatory Visit: Payer: Self-pay | Admitting: Family Medicine

## 2008-10-25 ENCOUNTER — Encounter: Payer: Self-pay | Admitting: Family Medicine

## 2008-10-25 ENCOUNTER — Ambulatory Visit: Payer: Self-pay | Admitting: Vascular Surgery

## 2008-10-29 ENCOUNTER — Ambulatory Visit: Payer: Self-pay | Admitting: Family Medicine

## 2008-11-01 ENCOUNTER — Ambulatory Visit: Payer: Self-pay | Admitting: Cardiology

## 2008-11-01 LAB — CONVERTED CEMR LAB
BUN: 57 mg/dL — ABNORMAL HIGH (ref 6–23)
Calcium: 9.1 mg/dL (ref 8.4–10.5)
Hemoglobin: 11.3 g/dL — ABNORMAL LOW (ref 12.0–15.0)
MCHC: 31.5 g/dL (ref 30.0–36.0)
Potassium: 4.1 meq/L (ref 3.5–5.3)
RDW: 14.4 % (ref 11.5–15.5)

## 2008-11-04 ENCOUNTER — Telehealth: Payer: Self-pay | Admitting: Family Medicine

## 2008-11-15 ENCOUNTER — Telehealth: Payer: Self-pay | Admitting: Family Medicine

## 2008-11-22 ENCOUNTER — Telehealth: Payer: Self-pay | Admitting: Family Medicine

## 2008-11-25 ENCOUNTER — Ambulatory Visit: Payer: Self-pay | Admitting: Family Medicine

## 2008-11-25 LAB — CONVERTED CEMR LAB: INR: 1.7

## 2008-12-08 ENCOUNTER — Encounter: Payer: Self-pay | Admitting: Family Medicine

## 2008-12-08 ENCOUNTER — Ambulatory Visit: Payer: Self-pay | Admitting: Family Medicine

## 2008-12-08 LAB — CONVERTED CEMR LAB: INR: 3

## 2008-12-17 ENCOUNTER — Ambulatory Visit: Payer: Self-pay | Admitting: Family Medicine

## 2008-12-20 LAB — CONVERTED CEMR LAB
BUN: 58 mg/dL — ABNORMAL HIGH (ref 6–23)
Cholesterol: 254 mg/dL — ABNORMAL HIGH (ref 0–200)
HDL: 37 mg/dL — ABNORMAL LOW (ref >39)
Potassium: 4.5 meq/L (ref 3.5–5.3)
Triglycerides: 715 mg/dL — ABNORMAL HIGH (ref <150)

## 2009-01-12 ENCOUNTER — Ambulatory Visit: Payer: Self-pay | Admitting: Family Medicine

## 2009-01-20 ENCOUNTER — Ambulatory Visit: Payer: Self-pay | Admitting: Family Medicine

## 2009-01-20 LAB — CONVERTED CEMR LAB: INR: 2.3

## 2009-01-31 ENCOUNTER — Ambulatory Visit: Payer: Self-pay | Admitting: Cardiology

## 2009-02-03 ENCOUNTER — Ambulatory Visit: Payer: Self-pay | Admitting: Family Medicine

## 2009-02-03 LAB — CONVERTED CEMR LAB: INR: 4

## 2009-02-10 ENCOUNTER — Ambulatory Visit: Payer: Self-pay | Admitting: Family Medicine

## 2009-03-18 ENCOUNTER — Encounter: Payer: Self-pay | Admitting: Family Medicine

## 2009-03-28 ENCOUNTER — Telehealth: Payer: Self-pay | Admitting: Family Medicine

## 2009-04-06 ENCOUNTER — Ambulatory Visit: Payer: Self-pay | Admitting: Family Medicine

## 2009-04-06 LAB — CONVERTED CEMR LAB: INR: 2.6

## 2009-04-13 ENCOUNTER — Telehealth: Payer: Self-pay | Admitting: Family Medicine

## 2009-05-02 ENCOUNTER — Ambulatory Visit: Payer: Self-pay | Admitting: Cardiology

## 2009-05-18 ENCOUNTER — Telehealth: Payer: Self-pay | Admitting: Family Medicine

## 2009-06-01 ENCOUNTER — Ambulatory Visit: Payer: Self-pay | Admitting: Cardiology

## 2009-06-01 DIAGNOSIS — R55 Syncope and collapse: Secondary | ICD-10-CM

## 2009-06-01 LAB — CONVERTED CEMR LAB
Basophils Relative: 0.2 % (ref 0.0–3.0)
CO2: 29 meq/L (ref 19–32)
Calcium: 9.2 mg/dL (ref 8.4–10.5)
GFR calc non Af Amer: 17.42 mL/min (ref >60)
HCT: 33.7 % — ABNORMAL LOW (ref 36.0–46.0)
Hemoglobin: 11.4 g/dL — ABNORMAL LOW (ref 12.0–15.0)
INR: 1.2 — ABNORMAL HIGH (ref 0.8–1.0)
Lymphocytes Relative: 15.2 % (ref 12.0–46.0)
Monocytes Relative: 5.3 % (ref 3.0–12.0)
Neutro Abs: 6.7 10*3/uL (ref 1.4–7.7)
Potassium: 3.2 meq/L — ABNORMAL LOW (ref 3.5–5.1)
RBC: 3.77 M/uL — ABNORMAL LOW (ref 3.87–5.11)
Sodium: 144 meq/L (ref 135–145)

## 2009-07-23 ENCOUNTER — Emergency Department (HOSPITAL_COMMUNITY): Admission: EM | Admit: 2009-07-23 | Discharge: 2009-07-23 | Payer: Self-pay | Admitting: Emergency Medicine

## 2009-07-27 ENCOUNTER — Ambulatory Visit: Payer: Self-pay | Admitting: Family Medicine

## 2009-07-27 LAB — CONVERTED CEMR LAB
Hgb A1c MFr Bld: 9.4 %
INR: 1.1

## 2009-07-28 LAB — CONVERTED CEMR LAB
Albumin: 3.8 g/dL (ref 3.5–5.2)
Alkaline Phosphatase: 81 units/L (ref 39–117)
CO2: 27 meq/L (ref 19–32)
Chloride: 92 meq/L — ABNORMAL LOW (ref 96–112)
Glucose, Bld: 411 mg/dL — ABNORMAL HIGH (ref 70–99)
MCHC: 32.8 g/dL (ref 30.0–36.0)
Potassium: 4.3 meq/L (ref 3.5–5.3)
RBC: 3.56 M/uL — ABNORMAL LOW (ref 3.87–5.11)
Sodium: 136 meq/L (ref 135–145)
Total Protein: 7.5 g/dL (ref 6.0–8.3)
WBC: 8.8 10*3/uL (ref 4.0–10.5)

## 2009-08-01 ENCOUNTER — Ambulatory Visit: Payer: Self-pay | Admitting: Cardiology

## 2009-08-01 ENCOUNTER — Telehealth: Payer: Self-pay | Admitting: Family Medicine

## 2009-08-10 ENCOUNTER — Ambulatory Visit: Payer: Self-pay | Admitting: Family Medicine

## 2009-08-10 LAB — CONVERTED CEMR LAB: INR: 1.4

## 2009-08-31 ENCOUNTER — Ambulatory Visit: Payer: Self-pay | Admitting: Family Medicine

## 2009-09-21 ENCOUNTER — Ambulatory Visit: Payer: Self-pay | Admitting: Family Medicine

## 2009-09-28 ENCOUNTER — Encounter: Payer: Self-pay | Admitting: Family Medicine

## 2009-09-28 ENCOUNTER — Ambulatory Visit: Payer: Self-pay | Admitting: Family Medicine

## 2009-09-28 LAB — CONVERTED CEMR LAB: Prothrombin Time: 36.9 s — ABNORMAL HIGH (ref 11.6–15.2)

## 2009-10-05 ENCOUNTER — Inpatient Hospital Stay (HOSPITAL_COMMUNITY)
Admission: EM | Admit: 2009-10-05 | Discharge: 2009-10-10 | Payer: Self-pay | Source: Home / Self Care | Admitting: Emergency Medicine

## 2009-10-05 ENCOUNTER — Encounter: Payer: Self-pay | Admitting: Family Medicine

## 2009-10-06 ENCOUNTER — Ambulatory Visit: Payer: Self-pay | Admitting: Cardiology

## 2009-10-06 ENCOUNTER — Ambulatory Visit: Payer: Self-pay | Admitting: Family Medicine

## 2009-10-18 ENCOUNTER — Ambulatory Visit: Payer: Self-pay | Admitting: Family Medicine

## 2009-10-18 ENCOUNTER — Encounter: Payer: Self-pay | Admitting: Family Medicine

## 2009-10-19 LAB — CONVERTED CEMR LAB
Glucose, Bld: 99 mg/dL (ref 70–99)
Potassium: 5.7 meq/L — ABNORMAL HIGH (ref 3.5–5.3)
Sodium: 140 meq/L (ref 135–145)

## 2009-10-25 ENCOUNTER — Ambulatory Visit: Payer: Self-pay | Admitting: Family Medicine

## 2009-10-25 ENCOUNTER — Encounter: Payer: Self-pay | Admitting: Family Medicine

## 2009-10-25 LAB — CONVERTED CEMR LAB: INR: 1.8

## 2009-10-28 LAB — CONVERTED CEMR LAB
CO2: 20 meq/L (ref 19–32)
Chloride: 108 meq/L (ref 96–112)
Creatinine, Ser: 1.78 mg/dL — ABNORMAL HIGH (ref 0.40–1.20)
Sodium: 140 meq/L (ref 135–145)

## 2009-10-31 ENCOUNTER — Ambulatory Visit: Payer: Self-pay | Admitting: Cardiology

## 2009-11-15 ENCOUNTER — Ambulatory Visit: Payer: Self-pay | Admitting: Family Medicine

## 2009-12-08 ENCOUNTER — Encounter: Payer: Self-pay | Admitting: Cardiology

## 2009-12-08 ENCOUNTER — Ambulatory Visit: Payer: Self-pay | Admitting: Family Medicine

## 2009-12-08 ENCOUNTER — Encounter: Payer: Self-pay | Admitting: Family Medicine

## 2009-12-08 ENCOUNTER — Ambulatory Visit: Payer: Self-pay | Admitting: Cardiology

## 2009-12-09 ENCOUNTER — Encounter (INDEPENDENT_AMBULATORY_CARE_PROVIDER_SITE_OTHER): Payer: Self-pay | Admitting: *Deleted

## 2009-12-09 LAB — CONVERTED CEMR LAB
CO2: 25 meq/L (ref 19–32)
Chloride: 98 meq/L (ref 96–112)
Glucose, Bld: 309 mg/dL — ABNORMAL HIGH (ref 70–99)
Potassium: 3.7 meq/L (ref 3.5–5.3)
Sodium: 135 meq/L (ref 135–145)

## 2009-12-28 ENCOUNTER — Ambulatory Visit: Payer: Self-pay | Admitting: Family Medicine

## 2010-01-11 ENCOUNTER — Ambulatory Visit: Payer: Self-pay | Admitting: Family Medicine

## 2010-01-27 ENCOUNTER — Ambulatory Visit: Payer: Self-pay | Admitting: Cardiology

## 2010-01-30 ENCOUNTER — Encounter: Payer: Self-pay | Admitting: Cardiology

## 2010-02-01 ENCOUNTER — Ambulatory Visit: Payer: Self-pay | Admitting: Family Medicine

## 2010-02-01 LAB — CONVERTED CEMR LAB: INR: 2.6

## 2010-02-22 ENCOUNTER — Ambulatory Visit: Admit: 2010-02-22 | Payer: Self-pay

## 2010-02-22 ENCOUNTER — Ambulatory Visit
Admission: RE | Admit: 2010-02-22 | Discharge: 2010-02-22 | Payer: Self-pay | Source: Home / Self Care | Attending: Family Medicine | Admitting: Family Medicine

## 2010-02-22 ENCOUNTER — Encounter: Payer: Self-pay | Admitting: Family Medicine

## 2010-02-22 ENCOUNTER — Inpatient Hospital Stay (HOSPITAL_COMMUNITY)
Admission: AD | Admit: 2010-02-22 | Discharge: 2010-02-27 | Payer: Self-pay | Source: Home / Self Care | Attending: Family Medicine | Admitting: Family Medicine

## 2010-02-27 LAB — BASIC METABOLIC PANEL
BUN: 29 mg/dL — ABNORMAL HIGH (ref 6–23)
BUN: 32 mg/dL — ABNORMAL HIGH (ref 6–23)
BUN: 34 mg/dL — ABNORMAL HIGH (ref 6–23)
BUN: 39 mg/dL — ABNORMAL HIGH (ref 6–23)
BUN: 44 mg/dL — ABNORMAL HIGH (ref 6–23)
CO2: 28 mEq/L (ref 19–32)
CO2: 29 mEq/L (ref 19–32)
CO2: 29 mEq/L (ref 19–32)
CO2: 31 mEq/L (ref 19–32)
CO2: 29 mEq/L (ref 19–32)
Calcium: 8.8 mg/dL (ref 8.4–10.5)
Calcium: 8.6 mg/dL (ref 8.4–10.5)
Calcium: 9.2 mg/dL (ref 8.4–10.5)
Calcium: 9.2 mg/dL (ref 8.4–10.5)
Calcium: 9.2 mg/dL (ref 8.4–10.5)
Chloride: 101 mEq/L (ref 96–112)
Chloride: 103 mEq/L (ref 96–112)
Chloride: 104 mEq/L (ref 96–112)
Chloride: 102 mEq/L (ref 96–112)
Chloride: 101 mEq/L (ref 96–112)
Creatinine, Ser: 1.8 mg/dL — ABNORMAL HIGH (ref 0.4–1.2)
Creatinine, Ser: 1.89 mg/dL — ABNORMAL HIGH (ref 0.4–1.2)
Creatinine, Ser: 2 mg/dL — ABNORMAL HIGH (ref 0.4–1.2)
Creatinine, Ser: 2.19 mg/dL — ABNORMAL HIGH (ref 0.4–1.2)
Creatinine, Ser: 2.2 mg/dL — ABNORMAL HIGH (ref 0.4–1.2)
GFR calc Af Amer: 33 mL/min — ABNORMAL LOW (ref >60)
GFR calc Af Amer: 31 mL/min — ABNORMAL LOW (ref >60)
GFR calc Af Amer: 29 mL/min — ABNORMAL LOW (ref >60)
GFR calc Af Amer: 26 mL/min — ABNORMAL LOW (ref >60)
GFR calc Af Amer: 26 mL/min — ABNORMAL LOW (ref >60)
GFR calc non Af Amer: 27 mL/min — ABNORMAL LOW (ref >60)
GFR calc non Af Amer: 26 mL/min — ABNORMAL LOW (ref >60)
GFR calc non Af Amer: 24 mL/min — ABNORMAL LOW (ref >60)
GFR calc non Af Amer: 22 mL/min — ABNORMAL LOW (ref >60)
GFR calc non Af Amer: 22 mL/min — ABNORMAL LOW (ref >60)
Glucose, Bld: 79 mg/dL (ref 70–99)
Glucose, Bld: 105 mg/dL — ABNORMAL HIGH (ref 70–99)
Glucose, Bld: 129 mg/dL — ABNORMAL HIGH (ref 70–99)
Glucose, Bld: 160 mg/dL — ABNORMAL HIGH (ref 70–99)
Glucose, Bld: 195 mg/dL — ABNORMAL HIGH (ref 70–99)
Potassium: 3 mEq/L — ABNORMAL LOW (ref 3.5–5.1)
Potassium: 3.2 mEq/L — ABNORMAL LOW (ref 3.5–5.1)
Potassium: 4.6 mEq/L (ref 3.5–5.1)
Potassium: 3.9 mEq/L (ref 3.5–5.1)
Potassium: 4.1 mEq/L (ref 3.5–5.1)
Sodium: 140 mEq/L (ref 135–145)
Sodium: 142 mEq/L (ref 135–145)
Sodium: 143 mEq/L (ref 135–145)
Sodium: 142 mEq/L (ref 135–145)
Sodium: 141 mEq/L (ref 135–145)

## 2010-02-27 LAB — CBC
HCT: 28.7 % — ABNORMAL LOW (ref 36.0–46.0)
HCT: 28.1 % — ABNORMAL LOW (ref 36.0–46.0)
HCT: 31.7 % — ABNORMAL LOW (ref 36.0–46.0)
Hemoglobin: 9 g/dL — ABNORMAL LOW (ref 12.0–15.0)
Hemoglobin: 8.7 g/dL — ABNORMAL LOW (ref 12.0–15.0)
Hemoglobin: 9.6 g/dL — ABNORMAL LOW (ref 12.0–15.0)
MCH: 28.2 pg (ref 26.0–34.0)
MCH: 27.8 pg (ref 26.0–34.0)
MCH: 27.9 pg (ref 26.0–34.0)
MCHC: 31.4 g/dL (ref 30.0–36.0)
MCHC: 31 g/dL (ref 30.0–36.0)
MCHC: 30.3 g/dL (ref 30.0–36.0)
MCV: 90 fL (ref 78.0–100.0)
MCV: 89.8 fL (ref 78.0–100.0)
MCV: 92.2 fL (ref 78.0–100.0)
Platelets: 202 10*3/uL (ref 150–400)
Platelets: 171 10*3/uL (ref 150–400)
Platelets: 196 10*3/uL (ref 150–400)
RBC: 3.19 MIL/uL — ABNORMAL LOW (ref 3.87–5.11)
RBC: 3.13 MIL/uL — ABNORMAL LOW (ref 3.87–5.11)
RBC: 3.44 MIL/uL — ABNORMAL LOW (ref 3.87–5.11)
RDW: 16.3 % — ABNORMAL HIGH (ref 11.5–15.5)
RDW: 16.5 % — ABNORMAL HIGH (ref 11.5–15.5)
RDW: 16.7 % — ABNORMAL HIGH (ref 11.5–15.5)
WBC: 7.8 10*3/uL (ref 4.0–10.5)
WBC: 5.7 10*3/uL (ref 4.0–10.5)
WBC: 7.2 10*3/uL (ref 4.0–10.5)

## 2010-02-27 LAB — DIFFERENTIAL
Basophils Absolute: 0 10*3/uL (ref 0.0–0.1)
Basophils Absolute: 0 10*3/uL (ref 0.0–0.1)
Basophils Relative: 0 % (ref 0–1)
Basophils Relative: 0 % (ref 0–1)
Eosinophils Absolute: 0.3 10*3/uL (ref 0.0–0.7)
Eosinophils Absolute: 0.3 10*3/uL (ref 0.0–0.7)
Eosinophils Relative: 4 % (ref 0–5)
Eosinophils Relative: 5 % (ref 0–5)
Lymphocytes Relative: 12 % (ref 12–46)
Lymphocytes Relative: 14 % (ref 12–46)
Lymphs Abs: 0.9 10*3/uL (ref 0.7–4.0)
Lymphs Abs: 0.8 10*3/uL (ref 0.7–4.0)
Monocytes Absolute: 0.5 10*3/uL (ref 0.1–1.0)
Monocytes Absolute: 0.4 10*3/uL (ref 0.1–1.0)
Monocytes Relative: 6 % (ref 3–12)
Monocytes Relative: 6 % (ref 3–12)
Neutro Abs: 6.1 10*3/uL (ref 1.7–7.7)
Neutro Abs: 4.3 10*3/uL (ref 1.7–7.7)
Neutrophils Relative %: 78 % — ABNORMAL HIGH (ref 43–77)
Neutrophils Relative %: 75 % (ref 43–77)

## 2010-02-27 LAB — URINALYSIS, MICROSCOPIC ONLY
Bilirubin Urine: NEGATIVE
Ketones, ur: NEGATIVE mg/dL
Nitrite: NEGATIVE
Protein, ur: NEGATIVE mg/dL
Specific Gravity, Urine: 1.007 (ref 1.005–1.030)
Urine Glucose, Fasting: NEGATIVE mg/dL
Urobilinogen, UA: 0.2 mg/dL (ref 0.0–1.0)
pH: 7 (ref 5.0–8.0)

## 2010-02-27 LAB — PROTIME-INR
INR: 2.14 — ABNORMAL HIGH (ref 0.00–1.49)
INR: 2.23 — ABNORMAL HIGH (ref 0.00–1.49)
INR: 2.03 — ABNORMAL HIGH (ref 0.00–1.49)
INR: 2.01 — ABNORMAL HIGH (ref 0.00–1.49)
INR: 1.96 — ABNORMAL HIGH (ref 0.00–1.49)
Prothrombin Time: 24.1 seconds — ABNORMAL HIGH (ref 11.6–15.2)
Prothrombin Time: 24.8 seconds — ABNORMAL HIGH (ref 11.6–15.2)
Prothrombin Time: 23.1 seconds — ABNORMAL HIGH (ref 11.6–15.2)
Prothrombin Time: 22.9 seconds — ABNORMAL HIGH (ref 11.6–15.2)
Prothrombin Time: 22.5 seconds — ABNORMAL HIGH (ref 11.6–15.2)

## 2010-02-27 LAB — URINALYSIS, DIPSTICK ONLY
Bilirubin Urine: NEGATIVE
Ketones, ur: NEGATIVE mg/dL
Nitrite: NEGATIVE
Protein, ur: NEGATIVE mg/dL
Specific Gravity, Urine: 1.01 (ref 1.005–1.030)
Urine Glucose, Fasting: NEGATIVE mg/dL
Urobilinogen, UA: 0.2 mg/dL (ref 0.0–1.0)
pH: 7 (ref 5.0–8.0)

## 2010-02-27 LAB — CARDIAC PANEL(CRET KIN+CKTOT+MB+TROPI)
CK, MB: 3 ng/mL (ref 0.3–4.0)
CK, MB: 2.4 ng/mL (ref 0.3–4.0)
CK, MB: 2.1 ng/mL (ref 0.3–4.0)
Relative Index: INVALID (ref 0.0–2.5)
Relative Index: INVALID (ref 0.0–2.5)
Relative Index: INVALID (ref 0.0–2.5)
Total CK: 73 U/L (ref 7–177)
Total CK: 61 U/L (ref 7–177)
Total CK: 54 U/L (ref 7–177)
Troponin I: 0.03 ng/mL (ref 0.00–0.06)
Troponin I: 0.03 ng/mL (ref 0.00–0.06)
Troponin I: 0.01 ng/mL (ref 0.00–0.06)

## 2010-02-27 LAB — URINE CULTURE
Colony Count: >=100000
Culture  Setup Time: 201201120022

## 2010-02-27 LAB — GLUCOSE, CAPILLARY
Glucose-Capillary: 91 mg/dL (ref 70–99)
Glucose-Capillary: 167 mg/dL — ABNORMAL HIGH (ref 70–99)
Glucose-Capillary: 137 mg/dL — ABNORMAL HIGH (ref 70–99)

## 2010-02-27 LAB — COMPREHENSIVE METABOLIC PANEL
ALT: 16 U/L (ref 0–35)
AST: 15 U/L (ref 0–37)
Albumin: 3.3 g/dL — ABNORMAL LOW (ref 3.5–5.2)
Alkaline Phosphatase: 93 U/L (ref 39–117)
BUN: 32 mg/dL — ABNORMAL HIGH (ref 6–23)
CO2: 27 mEq/L (ref 19–32)
Calcium: 9.2 mg/dL (ref 8.4–10.5)
Chloride: 105 mEq/L (ref 96–112)
Creatinine, Ser: 1.91 mg/dL — ABNORMAL HIGH (ref 0.4–1.2)
GFR calc Af Amer: 31 mL/min — ABNORMAL LOW (ref >60)
GFR calc non Af Amer: 25 mL/min — ABNORMAL LOW (ref >60)
Glucose, Bld: 86 mg/dL (ref 70–99)
Potassium: 3.4 mEq/L — ABNORMAL LOW (ref 3.5–5.1)
Sodium: 144 mEq/L (ref 135–145)
Total Bilirubin: 0.9 mg/dL (ref 0.3–1.2)
Total Protein: 7.4 g/dL (ref 6.0–8.3)

## 2010-02-27 LAB — HEMOGLOBIN A1C
Hgb A1c MFr Bld: 7.2 % — ABNORMAL HIGH (ref <5.7)
Mean Plasma Glucose: 160 mg/dL — ABNORMAL HIGH (ref <117)

## 2010-02-27 LAB — MAGNESIUM: Magnesium: 2.2 mg/dL (ref 1.5–2.5)

## 2010-02-27 LAB — BRAIN NATRIURETIC PEPTIDE: Pro B Natriuretic peptide (BNP): 489 pg/mL — ABNORMAL HIGH (ref 0.0–100.0)

## 2010-03-01 LAB — CULTURE, BLOOD (SINGLE)
Culture  Setup Time: 201201112238
Culture: NO GROWTH

## 2010-03-01 LAB — BASIC METABOLIC PANEL
BUN: 49 mg/dL — ABNORMAL HIGH (ref 6–23)
CO2: 30 mEq/L (ref 19–32)
Calcium: 9.2 mg/dL (ref 8.4–10.5)
Chloride: 101 mEq/L (ref 96–112)
Creatinine, Ser: 2.04 mg/dL — ABNORMAL HIGH (ref 0.4–1.2)
GFR calc Af Amer: 29 mL/min — ABNORMAL LOW (ref >60)
GFR calc non Af Amer: 24 mL/min — ABNORMAL LOW (ref >60)
Glucose, Bld: 108 mg/dL — ABNORMAL HIGH (ref 70–99)
Potassium: 4.1 mEq/L (ref 3.5–5.1)
Sodium: 140 mEq/L (ref 135–145)

## 2010-03-01 LAB — PROTIME-INR
INR: 1.74 — ABNORMAL HIGH (ref 0.00–1.49)
Prothrombin Time: 20.5 seconds — ABNORMAL HIGH (ref 11.6–15.2)

## 2010-03-01 LAB — CULTURE, BLOOD (ROUTINE X 2)
Culture  Setup Time: 201201112238
Culture: NO GROWTH

## 2010-03-03 NOTE — Discharge Summary (Signed)
[name], [name]                 ACCOUNT NO.:  [id number]  MEDICAL RECORD NO.:  [id number]          PATIENT TYPE:  INP  LOCATION:  6739                         FACILITY:  [facility]  PHYSICIAN:  Wayne A. Sheffield Slider, M.D.    DATE OF BIRTH:  March 02, 1930  DATE OF ADMISSION:  02/22/2010 DATE OF DISCHARGE:  02/27/2010                              DISCHARGE SUMMARY   PRIMARY CARE PROVIDER:  [name] A. [name], MD, [facility]  REASON FOR HOSPITALIZATION:  Dyspnea, orthopnea.  DISCHARGE DIAGNOSES: 1. Congestive heart failure exacerbation. 2. Pneumonia.  MEDICATIONS: NEW MEDICATIONS: Amoxicillin 500mg  po BID x7days Diltiazem 180mg  CD/ER 180mg  po daily Metoprolol 75mg  po twice daily Miralax 17mg  po twice daily Torsemide 100mg  po daily  CONTINUE HOME MEDICATIONS: Aspirin 81mg  po every evening Benazepril 20mg  po twice daily Ferrous sulfate 325mg  po at bedtime Glimepiride 4mg  po every morning Omeprazole 20mg  po every morning Simvastatin 28mg  po at bedtime Warfarin 2mg  po daily   DISCONTINUED MEDICATIONS: Coreg 12.5mg  po twice daily Torsemide 50mg  po every morning  CONSULTATIONS:  None.  PROCEDURES:  None.  IMAGES:  Chest x-ray on admission showed: 1. An increased patchy and streaky right basilar opacity. 2. No definite pulmonary edema. 3. Small bilateral pleural effusions versus chronic scarring.  PERTINENT LABORATORY DATA:  White blood count on admission 7.8, creatinine on admission 1.91, and creatinine on the day of discharge 2.04.  INR on admission 2.14.  INR on the day of discharge 1.74.  BNP 489.  A1c 7.2.  Cardiac enzymes negative x3.  Urinalysis positive for greater than 100,000 E-coli, pansensitive.  Weight on admission 70.1 kg. Weight at the time of discharge 69.6 kg.  Estimated dry weight 67.3 kg.  BRIEF HOSPITAL COURSE:  This is a 77 year old female with a history of chronic diastolic congestive heart failure thought to be secondary  to hypertrophic cardiomyopathy, chronic atrial fibrillation status post pacemaker on warfarin, and hypertension who was admitted for CHF exacerbation and community-acquired pneumonia. 1. CHF exacerbation.  The patient was complaining of dyspnea and     orthopnea on admission and she had a recent 10-pound weight gain.     Her BMP on admission was moderately elevated at 489.  Chest x-ray     showed possible small pleural effusions.  The patient was admitted     to the [facility] Medicine Teaching Service and observed on telemetry.     On the first day, the patient received 120 mg of IV Lasix.  Her     home torsemide 50 mg daily was held.  On hospital day #2, the Lasix     was discontinued and the patient was given torsemide 100 b.i.d.     The patient lost fluid weight appropriately on the diuretic     regimen.  She was around her estimated dry weight of 67.3 kg at the     time of discharge weight when she was 69.6 kg.  The patient was     discharged home with torsemide 100 mg daily which was twice her     previous home dose.  The patient was still  requiring oxygen at the     time of discharge.  The patient was saturating well on 2 L of     oxygen.  The patient was discharged home with home oxygen. 2. Right lower lobe community-acquired pneumonia.  Chest x-ray showed     a right basilar infiltrate.  The patient was started on     azithromycin and ceftriaxone initially.  The patient was kept on     these antibiotics for 5 days and finished her course of antibiotics     while in the hospital.  The patient remained afebrile through her     hospitalization and had decreased dyspnea through her course.  At     the time of discharge, the patient was saturating in the upper 90s     on 2 L, had no increased work of breathing, and was not complaining     of any dyspnea.  The patient was discharged home on the 2 L oxygen. 3. E-coli urinary tract infection.  The patient was complaining of     dysuria at  the time of admission and her urine culture was positive     for greater than 100,000 E-coli and was pansensitive.  The UTI was     treated with ceftriaxone in the hospital.  The patient was     discharged home with a 7-day course of amoxicillin.  Her creatinine     on admission was 1.91.  Her baseline creatinine is about 1.7-1.8.     Her creatinine did elevate slightly with the increased diuretics.     Her creatinine at the time of discharge was 2.0. 4. History of atrial fibrillation status post pacemaker, on warfarin.     The patient was therapeutic on admission.  At the time of     discharge, the patient's INR was on the low side of 1.74.  The     patient was treated with warfarin per Pharmacy and an appointment     was made for the patient to get her INR checked as an outpatient at     the Warfarin Clinic on March 01, 2010.  FOLLOWUP APPOINTMENTS:  An appointment was made for the patient to follow up with Dr. [name] (patient's PCP) this week. She will also follow-up at the Coumadin clinic at [facility] on 01/18.   FOLLOWUP ISSUES: 1. INR check. 2. The patient was sent home on oxygen.   DISCHARGE CONDITION:  The patient was discharged home in stable medical condition.    ______________________________ Priscella Mann, MD   ______________________________ Arnette Norris Sheffield Slider, M.D.    AO/MEDQ  D:  02/27/2010  T:  02/28/2010  Job:  [id number]  Electronically Signed by Priscella Mann MD on 02/28/2010 01:47:22 PM Electronically Signed by [name] M.D. on 03/03/2010 09:08:48 PM

## 2010-03-04 ENCOUNTER — Encounter: Payer: Self-pay | Admitting: Internal Medicine

## 2010-03-09 ENCOUNTER — Ambulatory Visit: Admission: RE | Admit: 2010-03-09 | Discharge: 2010-03-09 | Payer: Self-pay | Source: Home / Self Care

## 2010-03-09 ENCOUNTER — Encounter: Payer: Self-pay | Admitting: Family Medicine

## 2010-03-09 LAB — CONVERTED CEMR LAB
BUN: 47 mg/dL — ABNORMAL HIGH (ref 6–23)
Calcium: 9.4 mg/dL (ref 8.4–10.5)
Direct LDL: 79 mg/dL
Glucose, Bld: 144 mg/dL — ABNORMAL HIGH (ref 70–99)
INR: 2.8
Potassium: 3.7 meq/L (ref 3.5–5.3)

## 2010-03-14 NOTE — Assessment & Plan Note (Signed)
Summary: pacer check   Medications Added VICODIN 5-500 MG TABS (HYDROCODONE-ACETAMINOPHEN) as needed      Allergies Added:   Visit Type:  Follow-up Primary Provider:  Doralee Albino MD  CC:  pt feel at home yesterday-pt having sob and chest pain.  History of Present Illness: The patient is 77 years old and return for management of hypertrophic heart myopathy in atrial fibrillation and her pacemaker. She has a severe hypertrophic cardiopathy with a septal thickness of 17 mm. 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  Yesterday while she was at home she got up and went to the kitchen and then she apparently blacked out. Doesn't remember feeling bad prior to blacking out. She hit her eye and has a bruised from the fall.  She has had no recent chest pain or palpitations. She does have difficulty ambulating and is unsteady with her gait and has had previous falls.  She is [place]  Score 5. Dr. [name] has her on low-dose Coumadin as a compromise and she is a borderline on Coumadin candidate.  Preventive Screening-Counseling & Management  Alcohol-Tobacco     Smoking Status: always  Current Medications (verified): 1)  Bayer Childrens Aspirin 81 Mg Chew (Aspirin) .... Take 1 Tablet By Mouth Once A Day 2)  Benazepril Hcl 20 Mg  Tabs (Benazepril Hcl) .Marland Kitchen.. 1 By Mouth Daily 3)  Glimepiride 4 Mg  Tabs (Glimepiride) .... One Tab By Mouth Daily 4)  Lanoxin 0.125 Mg Tabs (Digoxin) .... Take 1 Tablet By Mouth Once A Day 5)  Simvastatin 20 Mg Tabs (Simvastatin) .Marland Kitchen.. 1 By Mouth At Bedtime 6)  Norvasc 10 Mg Tabs (Amlodipine Besylate) .... One Tab Daily 7)  Klor-Con M20 20 Meq  Tbcr (Potassium Chloride Crys Cr) .... One By Mouth Daily 8)  Ferrous Sulfate 325 (65 Fe) Mg  Tbec (Ferrous Sulfate) .... One Tab Daily 9)  Carvedilol 25 Mg  Tabs (Carvedilol) .... 1/2 Pill By Mouth 2 Times Daily 10)  Fluticasone Propionate 50 Mcg/act  Susp (Fluticasone Propionate)  .... 2 Sprays Each Nostril Once Daily Disp One Device 11)  Torsemide 100 Mg Tabs (Torsemide) .... Once Daily 12)  Omeprazole 20 Mg Cpdr (Omeprazole) .... Once Daily 13)  Hydrochlorothiazide 25 Mg  Tabs (Hydrochlorothiazide) .... Take 1 Tab By Mouth Every Morning 14)  Warfarin Sodium 2 Mg Tabs (Warfarin Sodium) .Marland Kitchen.. 1 By Mouth Once Daily As Directed 15)  Lofibra 134 Mg Caps (Fenofibrate Micronized) .... One By Mouth Daily For High Triglycerides 16)  Miralax   Powd (Polyethylene Glycol 3350) .Marland Kitchen.. 17g By Mouth Once Daily For Constipation Disp One Month Supply 17)  Vicodin 5-500 Mg Tabs (Hydrocodone-Acetaminophen) .... As Needed  Allergies (verified): 1)  ! Tramadol Hcl (Tramadol Hcl) 2)  Codeine Phosphate (Codeine Phosphate) 3)  Paxil (Paroxetine Hcl) 4)  Hydrocodone  Past History:  Past Medical History: Reviewed history from 10/14/2008 and no changes required. encephalomalcia (old CVA on CT scan) 11/07  cardiac pacemaker, - Dr. Audree Camel cards macular hole left eye - told will lose sight needs endocarditis prophylaxis nonischemic cardiomyopathy Afib -OFF  anticoagulation presumed 2 to anemia Coronary atherosclerosis and old MI and hx CVA  Depression Dysphagia DM - neuropathy CRI - Cr baseline 1.5-1.6 HTN, HLD Incontinence 1. Status post [employer] VVI pacemaker implantation. 2. Chronic atrial fibrillation. 3. Hypertrophic cardiomyopathy with normal left ventricular systolic     function. septal thickness 1.4cm 2009 4. History of congestive heart failure related  to diastolic     dysfunction 5. Nonobstructive coronary artery disease by catheterization. 6. Hypertension. 7. Hyperlipidemia. 8. History of previous stroke. 9. Chronic anemia.  Osteoarthritis, degeneration lumbar sacral area scolosis Hx falls Stasis dermatitis  Hearing loss anemia CHF: Cardiac Cath EF=25-30% - 08/10/2005, cardiolyte, EF=51% - 07/27/2002, echocardiogram EF=35-40% - 08/23/2004,  May 2008 EF 60%  mod to severe MR, LA and RA markedly dilated, RV sys pressure reduced, dilated IVC, moderate TR  Social History: Smoking Status:  always  Review of Systems       ROS is negative except as outlined in HPI.   Vital Signs:  Patient profile:   77 year old female Height:      65 inches Weight:      145 pounds BMI:     24.22 Pulse rate:   50 / minute Pulse (ortho):   60 / minute BP sitting:   119 / 69  (left arm) BP standing:   110 / 58 Cuff size:   regular  Vitals Entered By: Burnett Kanaris, CNA (June 01, 2009 10:11 AM)  Serial Vital Signs/Assessments:  Time      Position  BP       Pulse  Resp  Temp     By 10:30     Lying LA  132/58   60                    Sherri Rad, RN, BSN 10:30     Sitting   122/58   60                    Sherri Rad, Charity fundraiser, BSN 10:30     Standing  110/58   60                    Sherri Rad, RN, BSN  Comments: 10:30 Standing (2 min)- 110/60 HR- 60 Standing (5 min)- 118/60 HR- 60 By: Sherri Rad, RN, BSN    Physical Exam  Additional Exam:  Gen. Well-nourished, in no distress   Neck: No JVD, thyroid not enlarged, no carotid bruits Lungs: No tachypnea, clear without rales, rhonchi or wheezes Cardiovascular: Rhythm regular, PMI not displaced,  heart sounds  normal, no murmurs or gallops, no peripheral edema, pulses normal in all 4 extremities. Abdomen: BS normal, abdomen soft and non-tender without masses or organomegaly, no hepatosplenomegaly. MS: No deformities, no cyanosis or clubbing   Neuro:  No focal sns, her gait was somewhat wide-based and unsteady   Skin:  no lesions    PPM Specifications Following MD:  Everardo Beals. Juanda Chance, MD     PPM Vendor:  [employer] Jude     PPM Model Number:  [id number]     PPM Serial Number:  [id number] PPM DOI:  04/27/2005     PPM Implanting MD:  Everardo Beals. Juanda Chance, MD  Lead 1    Location: RA     DOI: 09/17/1995     Model #: [id number]     Serial #: [id number]     Status: active Lead 2    Location: RV     DOI: 09/17/1995     Model #:  [id number]     Serial #: [id number]     Status: active  Magnet Response Rate:  BOL 98.6 ERI 86.3  Indications:  HOCM   PPM Follow Up Remote Check?  No Battery Voltage:  2.78 V     Battery Est. Longevity:  10 years  Pacer Dependent:  No     Right Ventricle  Amplitude: 5.2 mV, Impedance: 410 ohms, Threshold: 0.625 V at 0.5 msec  Episodes Coumadin:  No Ventricular Pacing:  88%  Parameters Mode:  VVIR     Lower Rate Limit:  50     Upper Rate Limit:  120 Tech Comments:  No parameter changes.  Device function normal.  TTM's with [employer].  ROV 6 months.  Ms. Regner had a fall recently and she's not sure if she passed out but doesn't remember falling.   Altha Harm, LPN  June 01, 2009 10:31 AM   Impression & Recommendations:  Problem # 1:  SYNCOPE AND COLLAPSE (ICD-780.2)  She had a syncopal episode yesterday. On interrogation of her pacemaker there are no high rate episodes and the pacer function is good so it does not appear that this is related to an arrhythmia. It could be related to postural hypotension and we will plan to get lying sitting and standing blood pressures today. It could be that this was a fall and not true syncope and the memory of the events may be clouded by hitting her head. She is on Coumadin. We'll check an INR today. If her INR is elevated then we may want to consider a CT scan since he had trauma to the head. Her updated medication list for this problem includes:    Bayer Childrens Aspirin 81 Mg Chew (Aspirin) .Marland Kitchen... Take 1 tablet by mouth once a day    Benazepril Hcl 20 Mg Tabs (Benazepril hcl) .Marland Kitchen... 1 by mouth daily    Norvasc 10 Mg Tabs (Amlodipine besylate) ..... One tab daily    Carvedilol 25 Mg Tabs (Carvedilol) .Marland Kitchen... 1/2 pill by mouth 2 times daily    Warfarin Sodium 2 Mg Tabs (Warfarin sodium) .Marland Kitchen... 1 by mouth once daily as directed  Orders: TLB-BMP (Basic Metabolic Panel-BMET) (80048-METABOL) TLB-CBC Platelet - w/Differential (85025-CBCD) TLB-TSH (Thyroid  Stimulating Hormone) (84443-TSH) TLB-PT (Protime) (85610-PTP)  Problem # 2:  HOCM / IHSS (ICD-425.1) She has a severe hypertrophic cardio myopathy with a thick septum. She also has atrial fibrillation. She is not very active and I think her exercise tolerance is reduced for many reasons. It is possible that she could be hypovolemic and this might contribute to her recent fall. The hypertrophic heart myopathy could aggravate that. Plan evaluate that as above. Her updated medication list for this problem includes:    Bayer Childrens Aspirin 81 Mg Chew (Aspirin) .Marland Kitchen... Take 1 tablet by mouth once a day    Benazepril Hcl 20 Mg Tabs (Benazepril hcl) .Marland Kitchen... 1 by mouth daily    Lanoxin 0.125 Mg Tabs (Digoxin) .Marland Kitchen... Take 1 tablet by mouth once a day    Norvasc 10 Mg Tabs (Amlodipine besylate) ..... One tab daily    Carvedilol 25 Mg Tabs (Carvedilol) .Marland Kitchen... 1/2 pill by mouth 2 times daily    Torsemide 100 Mg Tabs (Torsemide) ..... Once daily    Hydrochlorothiazide 25 Mg Tabs (Hydrochlorothiazide) .Marland Kitchen... Take 1 tab by mouth every morning    Warfarin Sodium 2 Mg Tabs (Warfarin sodium) .Marland Kitchen... 1 by mouth once daily as directed  Problem # 3:  PACEMAKER (ICD-V45.Marland Kitchen01) We interrogated her pacemaker today. She is in chronic fibrillation and she is sensing about 10% of time and pacing about 90% of the time. There were no high rate episodes. Her updated medication list for this problem includes:    Bayer Childrens Aspirin 81 Mg Chew (Aspirin) .Marland Kitchen... Take 1 tablet  by mouth once a day    Benazepril Hcl 20 Mg Tabs (Benazepril hcl) .Marland Kitchen... 1 by mouth daily    Lanoxin 0.125 Mg Tabs (Digoxin) .Marland Kitchen... Take 1 tablet by mouth once a day    Norvasc 10 Mg Tabs (Amlodipine besylate) ..... One tab daily    Carvedilol 25 Mg Tabs (Carvedilol) .Marland Kitchen... 1/2 pill by mouth 2 times daily    Torsemide 100 Mg Tabs (Torsemide) ..... Once daily    Hydrochlorothiazide 25 Mg Tabs (Hydrochlorothiazide) .Marland Kitchen... Take 1 tab by mouth every morning     Warfarin Sodium 2 Mg Tabs (Warfarin sodium) .Marland Kitchen... 1 by mouth once daily as directed  Her updated medication list for this problem includes:    Bayer Childrens Aspirin 81 Mg Chew (Aspirin) .Marland Kitchen... Take 1 tablet by mouth once a day    Benazepril Hcl 20 Mg Tabs (Benazepril hcl) .Marland Kitchen... 1 by mouth daily    Lanoxin 0.125 Mg Tabs (Digoxin) .Marland Kitchen... Take 1 tablet by mouth once a day    Norvasc 10 Mg Tabs (Amlodipine besylate) ..... One tab daily    Carvedilol 25 Mg Tabs (Carvedilol) .Marland Kitchen... 1/2 pill by mouth 2 times daily    Torsemide 100 Mg Tabs (Torsemide) ..... Once daily    Hydrochlorothiazide 25 Mg Tabs (Hydrochlorothiazide) .Marland Kitchen... Take 1 tab by mouth every morning    Warfarin Sodium 2 Mg Tabs (Warfarin sodium) .Marland Kitchen... 1 by mouth once daily as directed  Problem # 4:  ATRIAL FIBRILLATION (ICD-427.31) She has chronic atrial fibrillation. The rate appears well controlled. She is not a very good Coumadin candidate because of frequent falls even though she is at high risk for systemic emboli. Dr. [name] has her on low dose Coumadin. Her updated medication list for this problem includes:    Bayer Childrens Aspirin 81 Mg Chew (Aspirin) .Marland Kitchen... Take 1 tablet by mouth once a day    Lanoxin 0.125 Mg Tabs (Digoxin) .Marland Kitchen... Take 1 tablet by mouth once a day    Carvedilol 25 Mg Tabs (Carvedilol) .Marland Kitchen... 1/2 pill by mouth 2 times daily    Warfarin Sodium 2 Mg Tabs (Warfarin sodium) .Marland Kitchen... 1 by mouth once daily as directed  Orders: TLB-BMP (Basic Metabolic Panel-BMET) (80048-METABOL) TLB-CBC Platelet - w/Differential (85025-CBCD) TLB-TSH (Thyroid Stimulating Hormone) (84443-TSH) TLB-PT (Protime) (85610-PTP) EKG w/ Interpretation (93000)  Patient Instructions: 1)  Your physician recommends that you have lab work today: bmet/cbc/pt/tsh (427.31) 2)  Your physician recommends that you continue on your current medications as directed. Please refer to the Current Medication list given to you today. 3)  Your physician wants you  to follow-up in: 6 months.  You will receive a reminder letter in the mail two months in advance. If you don't receive a letter, please call our office to schedule the follow-up appointment.

## 2010-03-14 NOTE — Progress Notes (Signed)
Summary: meds prob   Phone Note Call from Patient Call back at Home Phone [phone]   Caller: Patient Summary of Call: pt states that she cannot take the Hydrocodone - makes her sick on her stomach and needs something different  Dr [name] is out of town Initial call taken by: De Nurse,  April 13, 2009 9:39 AM  Follow-up for Phone Call        [facility] in [place]. she took one pill & was nauseous. states she will not take another. says she is allergic to codiene. to md to change meds. has never tried tramadol before  pcp is away this week. sent to Dr. [place] to change. pt states she is going to [facility] tomorrow & would like to be able to pick it up then Follow-up by: Golden Circle RN,  April 13, 2009 9:54 AM  Additional Follow-up for Phone Call Additional follow up Details #1::        Pt could try taking hydrocodone with food - this may help with the nausea.  Per her allergy list, she gets a rash and itching with tramadol.  She could try the tylenol again,  1000 mg every 8 hours for pain.  I know that he switched her to the hydrocodone because the tylenol was not working, but it may help her some.  She should NOT take the tylenol with the hydrocodone pills as they have tylenol in them, too. Additional Follow-up by: Sarah Swaziland MD,  April 13, 2009 10:54 AM   New Allergies: HYDROCODONE Additional Follow-up for Phone Call Additional follow up Details #2::    gave her above options. she will take they tylenol only every 8 hours. told her I will pass this to pcp who may have other options when he returns Follow-up by: Golden Circle RN,  April 13, 2009 10:58 AM  Additional Follow-up for Phone Call Additional follow up Details #3:: Details for Additional Follow-up Action Taken: Called.  Using aleve for pain with some relief.  Told needs coumadin check with this new med.  She will see me in about one month to follow up depression Additional Follow-up by: Doralee Albino MD,  April 18, 2009 10:10 AM  New Allergies: HYDROCODONE

## 2010-03-14 NOTE — Progress Notes (Signed)
  Phone Note Outgoing Call   Call placed by: Doralee Albino MD,  February 07, 2007 9:44 AM Summary of Call: Called to give lab work.  Double checked diuretic dose.  She is on toresemide 100 mg daily.  States having some back abd/pain.  No fever.  Eating OK.  No change in stools.  Not peeing as much as normal.  Marked increase in leg swelling.  Will increase toersimide to 200 mg daily for three days only.  If still pain and swelling, needs to be seen next week.  Patient agrees to plan.  Initial call taken by: Doralee Albino MD,  February 07, 2007 9:46 AM

## 2010-03-14 NOTE — Cardiovascular Report (Signed)
Summary: TTM  TTM   Imported By: Roderic Ovens 09/01/2008 13:52:06  _____________________________________________________________________  External Attachment:    Type:   Image     Comment:   External Document

## 2010-03-14 NOTE — Miscellaneous (Signed)
Summary: dx correction  Clinical Lists Changes  Problems: Changed problem from PACEMAKER (ICD-V45..01) to PACEMAKER, PERMANENT (ICD-V45.01)  changed the incorrect dx code to correct dx code 

## 2010-03-14 NOTE — Cardiovascular Report (Signed)
Summary: TTM   TTM   Imported By: Roderic Ovens 08/26/2009 15:56:05  _____________________________________________________________________  External Attachment:    Type:   Image     Comment:   External Document

## 2010-03-14 NOTE — Miscellaneous (Signed)
Summary: med changes  Clinical Lists Changes  Medications: Changed medication from BENAZEPRIL HCL 20 MG  TABS (BENAZEPRIL HCL) 1 by mouth daily to BENAZEPRIL HCL 20 MG  TABS (BENAZEPRIL HCL) take 1/2 tab by mouth once daily Changed medication from TORSEMIDE 100 MG TABS (TORSEMIDE) once daily to TORSEMIDE 100 MG TABS (TORSEMIDE) take 1/2 tab by mouth once daily Removed medication of HYDROCHLOROTHIAZIDE 25 MG  TABS (HYDROCHLOROTHIAZIDE) Take 1 tab by mouth every morning
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
## 2010-03-14 NOTE — Progress Notes (Signed)
Summary: triage   Phone Note Call from Patient Call back at Home Phone [phone]   Caller: Patient Summary of Call: Sugar has been over 500 and have eaten in 2 days. Initial call taken by: Clydell Hakim,  March 28, 2009 1:38 PM  Follow-up for Phone Call        397 after eating banana.  had jello yesterday & oatmeal the day before.  c/o nausea & HA. off balance x 3 days.  sounded confused.  lives with spouse. states she takes cbgs daily. unable to tell me any numbers. told her to go to ED now. we have no appt & she needs prompt attention. she agreed. made appt with pcp to f/u on her diabetes 04/06/09 Follow-up by: Golden Circle RN,  March 28, 2009 1:57 PM  Additional Follow-up for Phone Call Additional follow up Details #1::        noted and agree. Additional Follow-up by: Doralee Albino MD,  March 28, 2009 2:08 PM

## 2010-03-14 NOTE — Cardiovascular Report (Signed)
Summary: Office Visit   Office Visit   Imported By: Roderic Ovens 06/01/2009 16:42:11  _____________________________________________________________________  External Attachment:    Type:   Image     Comment:   External Document

## 2010-03-14 NOTE — Assessment & Plan Note (Signed)
Summary: f/u eo   Vital Signs:  Patient profile:   77 year old female Height:      68 inches Weight:      151 pounds BMI:     23.04 Temp:     99.1 degrees F oral Pulse rate:   71 / minute Pulse rhythm:   regular BP sitting:   147 / 70  (left arm) Cuff size:   regular  Vitals Entered By: Loralee Pacas CMA (August 31, 2009 1:43 PM)  Serial Vital Signs/Assessments:  Time      Position  BP       Pulse  Resp  Temp     By 1:46 PM                      56                    Loralee Pacas CMA    Primary Care Provider:  Doralee Albino MD   History of Present Illness: C/O chronic cough with some mucous production.  No fever, no change in color.  Still has lightheadedness on standing despite BP being better.  She actually feels worse than last month.  Forgot and has been taking lanoxin daily rather than 3 days a week.  She will make that change.  Habits & Providers  Alcohol-Tobacco-Diet     Alcohol drinks/day: <1  Current Medications (verified): 1)  Bayer Childrens Aspirin 81 Mg Chew (Aspirin) .... Take 1 Tablet By Mouth Once A Day 2)  Benazepril Hcl 20 Mg  Tabs (Benazepril Hcl) .... Take 1/2 Tab By Mouth Once Daily 3)  Glimepiride 4 Mg  Tabs (Glimepiride) .... One Tab By Mouth Daily 4)  Lanoxin 0.125 Mg Tabs (Digoxin) .... Take 1 Tablet By Mouth Every Monday, Weds and Friday (Three Days Per Week.) 5)  Simvastatin 20 Mg Tabs (Simvastatin) .Marland Kitchen.. 1 By Mouth At Bedtime 6)  Klor-Con M20 20 Meq  Tbcr (Potassium Chloride Crys Cr) .... One By Mouth Daily 7)  Ferrous Sulfate 325 (65 Fe) Mg  Tbec (Ferrous Sulfate) .... One Tab Daily 8)  Carvedilol 25 Mg  Tabs (Carvedilol) .... 1/2 Pill By Mouth 2 Times Daily 9)  Fluticasone Propionate 50 Mcg/act  Susp (Fluticasone Propionate) .... 2 Sprays Each Nostril Once Daily Disp One Device 10)  Torsemide 100 Mg Tabs (Torsemide) .... Take 1/2 Tab By Mouth Once Daily 11)  Omeprazole 20 Mg Cpdr (Omeprazole) .... Once Daily 12)  Warfarin Sodium 2 Mg Tabs  (Warfarin Sodium) .Marland Kitchen.. 1 By Mouth Once Daily As Directed 13)  Lofibra 134 Mg Caps (Fenofibrate Micronized) .... One By Mouth Daily For High Triglycerides 14)  Miralax   Powd (Polyethylene Glycol 3350) .Marland Kitchen.. 17g By Mouth Once Daily For Constipation Disp One Month Supply  Allergies (verified): 1)  ! Tramadol Hcl (Tramadol Hcl) 2)  ! Hydrocodone 3)  Codeine Phosphate (Codeine Phosphate) 4)  Paxil (Paroxetine Hcl)  Past History:  Past medical, surgical, family and social histories (including risk factors) reviewed, and no changes noted (except as noted below).  Past Medical History: Reviewed history from 10/14/2008 and no changes required. encephalomalcia (old CVA on CT scan) 11/07  cardiac pacemaker, - Dr. Audree Camel cards macular hole left eye - told will lose sight needs endocarditis prophylaxis nonischemic cardiomyopathy Afib -OFF  anticoagulation presumed 2 to anemia Coronary atherosclerosis and old MI and hx CVA  Depression Dysphagia DM - neuropathy CRI - Cr baseline 1.5-1.6 HTN, HLD Incontinence  1. Status post [employer]. Jude VVI pacemaker implantation. 2. Chronic atrial fibrillation. 3. Hypertrophic cardiomyopathy with normal left ventricular systolic     function. septal thickness 1.4cm 2009 4. History of congestive heart failure related to diastolic     dysfunction 5. Nonobstructive coronary artery disease by catheterization. 6. Hypertension. 7. Hyperlipidemia. 8. History of previous stroke. 9. Chronic anemia.  Osteoarthritis, degeneration lumbar sacral area scolosis Hx falls Stasis dermatitis  Hearing loss anemia CHF: Cardiac Cath EF=25-30% - 08/10/2005, cardiolyte, EF=51% - 07/27/2002, echocardiogram EF=35-40% - 08/23/2004,  May 2008 EF 60% mod to severe MR, LA and RA markedly dilated, RV sys pressure reduced, dilated IVC, moderate TR  Past Surgical History: Reviewed history from 05/30/2007 and no changes required. bilateral foot surg - 08/19/2001 bladder tack -  08/19/2001,   Hysterectomy & BSO - 08/19/2001,  melanoma removed - 08/19/2001,  S/P - 09/09/2001 Pacemaker placement x 2   Family History: Reviewed history from 04/11/2006 and no changes required. MI, Ca, (colon) CVA, DM  Social History: Reviewed history from 05/30/2007 and no changes required. Lives in [place]; non smoker; non drinker. Lives with husband of 55 years. 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   Review of Systems  The patient denies chest pain, syncope, and dyspnea on exertion.    Physical Exam  General:  Well-developed,well-nourished,in no acute distress; alert,appropriate and cooperative throughout examination Lungs:  Normal respiratory effort, chest expands symmetrically. Lungs are clear to auscultation, no crackles or wheezes. Heart:  Normal rate and regular rhythm. S1 and S2 normal without gallop, murmur, click, rub or other extra sounds. Extremities:  trace bilateral edema  Diabetes Management Exam:    Foot Exam (with socks and/or shoes not present):       Sensory-Pinprick/Light touch:          Left medial foot (L-4): normal          Left dorsal foot (L-5): normal          Left lateral foot (S-1): normal          Right medial foot (L-4): normal          Right dorsal foot (L-5): normal          Right lateral foot (S-1): normal       Sensory-Monofilament:          Left foot: normal          Right foot: normal       Inspection:          Left foot: normal          Right foot: normal       Nails:          Left foot: normal          Right foot: normal   Impression & Recommendations:  Problem # 1:  HYPERTENSION, BENIGN SYSTEMIC (ICD-401.1)  Given her dizziness, I want her BP to drift a bit higher. Her updated medication list for this problem includes:    Benazepril Hcl 20 Mg Tabs (Benazepril hcl) .Marland Kitchen... Take 1/2 tab by mouth once daily    Carvedilol 25 Mg Tabs (Carvedilol) .Marland Kitchen... 1/2 pill by mouth 2 times daily    Torsemide 100 Mg Tabs (Torsemide) .Marland Kitchen... Take 1/2  tab by mouth once daily  Orders: [facility]- Est  Level 4 (99214)  BP today: 147/70 Prior BP: 85/45 (07/27/2009)  Labs Reviewed: K+: 4.3 (07/27/2009) Creat: : 2.22 (07/27/2009)   Chol: 254 (12/17/2008)   HDL: 37 (12/17/2008)  LDL: See Comment mg/dL (81/19/1478)   TG: 295 (12/17/2008)  Problem # 2:  DIABETES MELLITUS, II, COMPLICATIONS (ICD-250.92)  If her A1C does not improve over next three months, begin on insulin Her updated medication list for this problem includes:    Bayer Childrens Aspirin 81 Mg Chew (Aspirin) .Marland Kitchen... Take 1 tablet by mouth once a day    Benazepril Hcl 20 Mg Tabs (Benazepril hcl) .Marland Kitchen... Take 1/2 tab by mouth once daily    Glimepiride 4 Mg Tabs (Glimepiride) ..... One tab by mouth daily  Orders: [facility]- Est  Level 4 (62130)  Labs Reviewed: Creat: 2.22 (07/27/2009)     Last Eye Exam: Results: Normal.  Date is arbitrary.  Patient reports seen around this time and normal exam.  (12/08/2008) Reviewed HgBA1c results: 9.4 (07/27/2009)  8.1 (01/12/2009)  Problem # 3:  AFTERCARE, LONG-TERM USE, ANTICOAGULANTS (ICD-V58.61)  Orders: [facility]R/PT-[facility] (86578) [facility]- Est  Level 4 (46962)  Problem # 4:  CHF - EJECTION FRACTION < 50% (ICD-428.22) Assessment: Unchanged  Her updated medication list for this problem includes:    Bayer Childrens Aspirin 81 Mg Chew (Aspirin) .Marland Kitchen... Take 1 tablet by mouth once a day    Benazepril Hcl 20 Mg Tabs (Benazepril hcl) .Marland Kitchen... Take 1/2 tab by mouth once daily    Lanoxin 0.125 Mg Tabs (Digoxin) .Marland Kitchen... Take 1 tablet by mouth every monday, weds and friday (three days per week.)    Carvedilol 25 Mg Tabs (Carvedilol) .Marland Kitchen... 1/2 pill by mouth 2 times daily    Torsemide 100 Mg Tabs (Torsemide) .Marland Kitchen... Take 1/2 tab by mouth once daily    Warfarin Sodium 2 Mg Tabs (Warfarin sodium) .Marland Kitchen... 1 by mouth once daily as directed  Echocardiogram:   SUMMARY   -  Overall left ventricular systolic function was normal. Left         ventricular ejection fraction was  estimated to be 60 %. There         were no left ventricular regional wall motion abnormalities.   -  The aortic valve was mildly to moderately calcified.   -  There was moderate to severe mitral valvular regurgitation.   -  The left atrium was markedly dilated.   -  The pulmonary veins were grossly normal.   -  Right ventricular systolic function was mild to moderately         reduced. There was the appearance of a catheter or pacing         wire in the right ventricle.   -  The estimated peak pulmonary artery systolic pressure was         moderately increased.   -  There was moderate tricuspid valvular regurgitation.   -  The right atrium was markedly dilated.   -  The inferior vena cava was dilated. Respirophasic inferior vena         cava changes were blunted (less than 50% variation).    ---------------------------------------------------------------   Prepared and Electronically Authenticated by   Nicholes Mango MD (06/18/2006)  Orders: [facility]- Est  Level 4 (95284)  Complete Medication List: 1)  Bayer Childrens Aspirin 81 Mg Chew (Aspirin) .... Take 1 tablet by mouth once a day 2)  Benazepril Hcl 20 Mg Tabs (Benazepril hcl) .... Take 1/2 tab by mouth once daily 3)  Glimepiride 4 Mg Tabs (Glimepiride) .... One tab by mouth daily 4)  Lanoxin 0.125 Mg Tabs (Digoxin) .... Take 1 tablet by mouth every monday, weds and friday (three days  per week.) 5)  Simvastatin 20 Mg Tabs (Simvastatin) .Marland Kitchen.. 1 by mouth at bedtime 6)  Klor-con M20 20 Meq Tbcr (Potassium chloride crys cr) .... One by mouth daily 7)  Ferrous Sulfate 325 (65 Fe) Mg Tbec (Ferrous sulfate) .... One tab daily 8)  Carvedilol 25 Mg Tabs (Carvedilol) .... 1/2 pill by mouth 2 times daily 9)  Fluticasone Propionate 50 Mcg/act Susp (Fluticasone propionate) .... 2 sprays each nostril once daily disp one device 10)  Torsemide 100 Mg Tabs (Torsemide) .... Take 1/2 tab by mouth once daily 11)  Omeprazole 20 Mg Cpdr (Omeprazole) ....  Once daily 12)  Warfarin Sodium 2 Mg Tabs (Warfarin sodium) .Marland Kitchen.. 1 by mouth once daily as directed 13)  Lofibra 134 Mg Caps (Fenofibrate micronized) .... One by mouth daily for high triglycerides 14)  Miralax Powd (Polyethylene glycol 3350) .Marland Kitchen.. 17g by mouth once daily for constipation disp one month supply  Patient Instructions: 1)  Make sure you take you digoxin every other day, not daily. 2)  See me in two months.  Unless diabetes is better, I will put you on insulin.     ANTICOAGULATION RECORD PREVIOUS REGIMEN & LAB RESULTS Anticoagulation Diagnosis:  Deep venous thrombosis,arteriosclerosis on  05/17/2006 Previous INR Goal Range:  2 - 3 on  05/17/2006 Previous INR:  1.4 on  08/10/2009 Previous Coumadin Dose(mg):  2 mg tablets on  04/06/2009 Previous Regimen:  2mg  M,W,F; 5mg  other days on  08/10/2009 Previous Coagulation Comments:  Pt was given two options for taking her warfarin based on tablet size (subject to getting new prescription for 2 mg tablets).  Called pt on 02-15-09 to find out which protocol she was using in order to make her return appt.  Pt's husband had not been able to get warfarin from the pharmacy - not sure why.  Pt has not taken any warfarin since leaving here on 02-10-09.  Restarted 2.5 mg on 02-15-09 and told pt I would call again after discussing with Dr. [name].   Per Dr. [name], pt to take 2.5 mg every day  except Tues & Fri take none.  Recheck next week.  on  02/10/2009  NEW REGIMEN & LAB RESULTS Current INR: 2.9 Regimen: continue same:  2 mg - M, W, F;  5 mg - other days  Provider: Hensel Repeat testing in: 3 weeks  09-21-09 Other Comments: ...............test performed by......Marland KitchenBonnie A. [place], MLS (ASCP)cm   Dose has been reviewed with patient or caretaker during this visit. Reviewed by: Dr. [name]  Anticoagulation Visit Questionnaire Coumadin dose missed/changed:  No Abnormal Bleeding Symptoms:  No  Any diet changes including alcohol intake,  vegetables or greens since the last visit:  No Any illnesses or hospitalizations since the last visit:  No Any signs of clotting since the last visit (including chest discomfort, dizziness, shortness of breath, arm tingling, slurred speech, swelling or redness in leg):  No  MEDICATIONS [employer] CHIL[employer]ENS ASPIRIN 81 MG CHEW (ASPIRIN) Take 1 tablet by mouth once a day BENAZEPRIL HCL 20 MG  TABS (BENAZEPRIL HCL) take 1/2 tab by mouth once daily GLIMEPIRIDE 4 MG  TABS (GLIMEPIRIDE) one tab by mouth daily LANOXIN 0.125 MG TABS (DIGOXIN) Take 1 tablet by mouth every Monday, Weds and Friday (three days per week.) SIMVASTATIN 20 MG TABS (SIMVASTATIN) 1 by mouth at bedtime KLOR-CON M20 20 MEQ  TBCR (POTASSIUM CHLORIDE CRYS CR) one by mouth daily FERROUS SULFATE 325 (65 FE) MG  TBEC (FERROUS SULFATE) one tab daily CARVEDILOL 25  MG  TABS (CARVEDILOL) 1/2 pill by mouth 2 times daily FLUTICASONE PROPIONATE 50 MCG/ACT  SUSP (FLUTICASONE PROPIONATE) 2 sprays each nostril once daily Disp one device TORSEMIDE 100 MG TABS (TORSEMIDE) take 1/2 tab by mouth once daily OMEPRAZOLE 20 MG CPDR (OMEPRAZOLE) once daily WARFARIN SODIUM 2 MG TABS (WARFARIN SODIUM) 1 by mouth once daily as directed LOFIBRA 134 MG CAPS (FENOFIBRATE MICRONIZED) one by mouth daily for high triglycerides MIRALAX   POWD (POLYETHYLENE GLYCOL 3350) 17g by mouth once daily for constipation Disp one month supply    Prevention & Chronic Care Immunizations   Influenza vaccine: Fluvax 3+  (12/17/2008)   Influenza vaccine due: 11/11/2008    Tetanus booster: 08/12/2001: Done.   Tetanus booster due: 08/13/2011    Pneumococcal vaccine: Done.  (12/14/1999)   Pneumococcal vaccine due: None    H. zoster vaccine: Not documented  Colorectal Screening   Hemoccult: Done.  (10/13/2005)   Hemoccult due: Not Indicated    Colonoscopy: Location:  [facility].    (12/18/2007)   Colonoscopy due: 12/17/2017  Other Screening   Pap smear:  Not documented   Pap smear due: Not Indicated    Mammogram: Done.  (05/13/2004)   Mammogram action/deferral: Not indicated  (10/29/2008)   Mammogram due: 05/13/2005    DXA bone density scan: Done.  (09/13/2002)   DXA bone density action/deferral: Not indicated  (10/29/2008)   DXA scan due: None    Smoking status: never  (07/27/2009)  Diabetes Mellitus   HgbA1C: 9.4  (07/27/2009)   HgbA1C action/deferral: Ordered  (12/17/2008)   Hemoglobin A1C due: 03/02/2008    Eye exam: Results: Normal.  Date is arbitrary.  Patient reports seen around this time and normal exam.   (12/08/2008)   Diabetic eye exam action/deferral: Ophthalmology referral  (10/29/2008)   Eye exam due: 12/2009    Foot exam: yes  (08/31/2009)   Foot exam action/deferral: Do today   High risk foot: No  (09/22/2008)   Foot care education: Done  (09/22/2008)    Urine microalbumin/creatinine ratio: Not documented   Urine microalbumin action/deferral: Not indicated    Diabetes flowsheet reviewed?: Yes   Progress toward A1C goal: Unchanged  Lipids   Total Cholesterol: 254  (12/17/2008)   Lipid panel action/deferral: Lipid Panel ordered   LDL: See Comment mg/dL  (04/54/0981)   LDL Direct: 61  (09/17/2007)   HDL: 37  (12/17/2008)   Triglycerides: 715  (12/17/2008)    SGOT (AST): 16  (07/27/2009)   SGPT (ALT): 16  (07/27/2009)   Alkaline phosphatase: 81  (07/27/2009)   Total bilirubin: 0.4  (07/27/2009)    Lipid flowsheet reviewed?: Yes   Progress toward LDL goal: Unchanged  Hypertension   Last Blood Pressure: 147 / 70  (08/31/2009)   Serum creatinine: 2.22  (07/27/2009)   Serum potassium 4.3  (07/27/2009)    Hypertension flowsheet reviewed?: Yes   Progress toward BP goal: At goal  Self-Management Support :   Personal Goals (by the next clinic visit) :     Personal A1C goal: 7  (10/29/2008)     Personal blood pressure goal: 130/80  (10/29/2008)     Personal LDL goal: 100  (10/29/2008)    Diabetes  self-management support: Written self-care plan  (04/06/2009)    Hypertension self-management support: Written self-care plan  (04/06/2009)    Lipid self-management support: Written self-care plan  (04/06/2009)

## 2010-03-14 NOTE — Cardiovascular Report (Signed)
Summary: TTM   TTM   Imported By: Roderic Ovens 03/22/2009 16:03:14  _____________________________________________________________________  External Attachment:    Type:   Image     Comment:   External Document

## 2010-03-14 NOTE — Cardiovascular Report (Signed)
Summary: TTM   TTM   Imported By: Roderic Ovens 08/26/2009 15:53:20  _____________________________________________________________________  External Attachment:    Type:   Image     Comment:   External Document

## 2010-03-14 NOTE — Assessment & Plan Note (Signed)
Summary: flu shot,df   Nurse Visit   Vital Signs:  Patient profile:   77 year old female Temp:     98.2 degrees F  Vitals Entered By: Theresia Lo RN (December 08, 2009 9:47 AM)  Allergies: 1)  ! Tramadol Hcl (Tramadol Hcl) 2)  ! Hydrocodone 3)  Codeine Phosphate (Codeine Phosphate) 4)  Paxil (Paroxetine Hcl)  Immunizations Administered:  Influenza Vaccine # 1:    Vaccine Type: Fluvax MCR    Site: right deltoid    Mfr: GlaxoSmithKline    Dose: 0.5 ml    Route: IM    Given by: Theresia Lo RN    Exp. Date: 08/09/2010    Lot #: [id number]    VIS given: 09/06/09 version given December 08, 2009.  Flu Vaccine Consent Questions:    Do you have a history of severe allergic reactions to this vaccine? no    Any prior history of allergic reactions to egg and/or gelatin? no    Do you have a sensitivity to the preservative Thimersol? no    Do you have a past history of Guillan-Barre Syndrome? no    Do you currently have an acute febrile illness? no    Have you ever had a severe reaction to latex? no    Vaccine information given and explained to patient? yes    Are you currently pregnant? no  Orders Added: 1)  Influenza Vaccine MCR [00025] 2)  Administration Flu vaccine - MCR [G0008]     Vital Signs:  Patient profile:   77 year old female Temp:     98.2 degrees F  Vitals Entered By: Theresia Lo RN (December 08, 2009 9:47 AM)

## 2010-03-14 NOTE — Progress Notes (Signed)
Summary: Rx Prob  Medications Added TRAMADOL HCL 50 MG  TABS (TRAMADOL HCL) one by mouth q8h as needed pain       Phone Note Call from Patient Call back at Home Phone [phone]   Caller: Patient Summary of Call: pt states that she can not take the pain meds Dr. [name] perscribed due to it making her itch.  Not sure if there is anything else she can take. Initial call taken by: Clydell Hakim,  August 01, 2009 10:49 AM  Follow-up for Phone Call        spoke with patient and she took pain med Friday and itched all day Sat and Sunday. has not taken any more. will send message to MD. Follow-up by: Patricia Smith RN,  August 01, 2009 2:04 PM  Additional Follow-up for Phone Call Additional follow up Details #1::        Called and discussed.  Itching with hydrocodone.  Will try tramadol. Additional Follow-up by: William Hensel MD,  August 01, 2009 2:40 PM   New Allergies: ! HYDROCODONE New/Updated Medications: TRAMADOL HCL 50 MG  TABS (TRAMADOL HCL) one by mouth q8h as needed pain New Allergies: ! HYDROCODONEPrescriptions: TRAMADOL HCL 50 MG  TABS (TRAMADOL HCL) one by mouth q8h as needed pain  #60 x 3   Entered and Authorized by:   William Hensel MD   Signed by:   William Hensel MD on 08/01/2009   Method used:   Electronically to        [facility]  [address] (retail)       [address]       [place], [place]  [address]       Ph: [phone]       Fax: [phone]   RxID:   [id number]

## 2010-03-14 NOTE — Progress Notes (Signed)
Summary: triage   Phone Note Call from Patient Call back at Home Phone [phone]   Reason for Call: Talk to Nurse Summary of Call: pain in ankle, thinks its from the stent that was put in Initial call taken by: Knox Royalty,  May 18, 2009 1:52 PM  Follow-up for Phone Call        L ankle hurts. started this am. has not taken any meds. thinks she broke her toes months ago. md said it was not broken. Cannot walk on it. no color changes. still warm. states children do not help. no ride. states she will either call back when she can get a ride or call 911 & go to ED.  Follow-up by: Golden Circle RN,  May 18, 2009 2:38 PM  Additional Follow-up for Phone Call Additional follow up Details #1::        noted and agree Additional Follow-up by: Doralee Albino MD,  May 18, 2009 3:32 PM

## 2010-03-14 NOTE — Cardiovascular Report (Signed)
Summary: TTM   TTM   Imported By: Roderic Ovens 08/26/2009 15:52:12  _____________________________________________________________________  External Attachment:    Type:   Image     Comment:   External Document

## 2010-03-14 NOTE — Initial Assessments (Signed)
Summary: History and Physical   Vital Signs:  Patient profile:   77 year old female O2 Sat:      94 % on 2 L/min Temp:     103.3 degrees F Pulse rate:   75 / minute Resp:     20 per minute BP supine:   141 / 76  Primary Provider:  Doralee Albino MD   History of Present Illness: CC: Shortness of Breath HPI: Pt. says she has been feeling short of breath for three weeks and that today she couldn't breathe and just could not take it anymore.  She says that lying still makes it better and nothing makes it worse.  She says she is able to lay flat but she has had difficulty sleeping because of her breathing.  She says she has been coughing up black stuff.  She denies and chest pain, numbness or tingling.  She also complains of abdominal pain that has been going on for 3 weeks as well.  She says it is in the right lower quadrant, and that it has been steady pain, not getting better or worse.  She says she has had diarrhea for two days, and that it is dark but she take iron tablets.  She denies any bright red blood in her stool.  she also says she has had some vomiting, and describes post-tussive vomiting.  She says her vomit has been black looking.  She denies any fever or chills.    She says she has had urinary frequency but when she goes to the bathroom only four drops of urine come out.  Denies pain with urination and back pain.   Allergies: 1)  ! Tramadol Hcl (Tramadol Hcl) 2)  ! Hydrocodone 3)  Codeine Phosphate (Codeine Phosphate) 4)  Paxil (Paroxetine Hcl)  Past History:  Past Medical History: Last updated: 10/14/2008 encephalomalcia (old CVA on CT scan) 11/07  cardiac pacemaker, - Dr. Audree Camel cards macular hole left eye - told will lose sight needs endocarditis prophylaxis nonischemic cardiomyopathy Afib -OFF  anticoagulation presumed 2 to anemia Coronary atherosclerosis and old MI and hx CVA  Depression Dysphagia DM - neuropathy CRI - Cr baseline 1.5-1.6 HTN,  HLD Incontinence 1. Status post St. Jude VVI pacemaker implantation. 2. Chronic atrial fibrillation. 3. Hypertrophic cardiomyopathy with normal left ventricular systolic     function. septal thickness 1.4cm 2009 4. History of congestive heart failure related to diastolic     dysfunction 5. Nonobstructive coronary artery disease by catheterization. 6. Hypertension. 7. Hyperlipidemia. 8. History of previous stroke. 9. Chronic anemia.  Osteoarthritis, degeneration lumbar sacral area scolosis Hx falls Stasis dermatitis  Hearing loss anemia CHF: Cardiac Cath EF=25-30% - 08/10/2005, cardiolyte, EF=51% - 07/27/2002, echocardiogram EF=35-40% - 08/23/2004,  May 2008 EF 60% mod to severe MR, LA and RA markedly dilated, RV sys pressure reduced, dilated IVC, moderate TR  Past Surgical History: Last updated: 05/30/2007 bilateral foot surg - 08/19/2001 bladder tack - 08/19/2001,   Hysterectomy & BSO - 08/19/2001,  melanoma removed - 08/19/2001,  S/P - 09/09/2001 Pacemaker placement x 2   Family History: Last updated: 04/11/2006 MI, Ca, (colon) CVA, DM  Social History: Last updated: 05/30/2007 Lives in [place]; non smoker; non drinker. Lives with husband of 55 years. 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   Physical Exam  General:  Well-developed,well-nourished,in no acute distress; alert,appropriate and cooperative throughout examination Head:  Normocephalic and atraumatic without obvious abnormalities. Eyes:  No corneal or conjunctival inflammation noted. EOMI. Perrla.  Nose:  External nasal examination shows no deformity or inflammation.  Mouth:  Oral mucosa and oropharynx without lesions or exudates.   Neck:  no JVD and no cervical lymphadenopathy.   Lungs:  Bibasilar crackles.   Heart:  irregularly irregular rhythm, normal rate.  Abdomen:  soft, Right lower quadrant tenderness to palpation.  + BS Pulses:  1+ radial pulses, 1+ dorsalis pedis pulses.  Extremities:  No LE edema.      Complete Medication List: 1)  Bayer Childrens Aspirin 81 Mg Chew (Aspirin) .... Take 1 tablet by mouth once a day 2)  Benazepril Hcl 20 Mg Tabs (Benazepril hcl) .... Take 1/2 tab by mouth once daily 3)  Glimepiride 4 Mg Tabs (Glimepiride) .... One tab by mouth daily 4)  Lanoxin 0.125 Mg Tabs (Digoxin) .... Take 1 tablet by mouth every monday, weds and friday (three days per week.) 5)  Simvastatin 20 Mg Tabs (Simvastatin) .Marland Kitchen.. 1 by mouth at bedtime 6)  Klor-con M20 20 Meq Tbcr (Potassium chloride crys cr) .... One by mouth daily 7)  Ferrous Sulfate 325 (65 Fe) Mg Tbec (Ferrous sulfate) .... One tab daily 8)  Carvedilol 25 Mg Tabs (Carvedilol) .... 1/2 pill by mouth 2 times daily 9)  Fluticasone Propionate 50 Mcg/act Susp (Fluticasone propionate) .... 2 sprays each nostril once daily disp one device 10)  Torsemide 100 Mg Tabs (Torsemide) .... Take 1/2 tab by mouth once daily 11)  Omeprazole 20 Mg Cpdr (Omeprazole) .... Once daily 12)  Warfarin Sodium 2 Mg Tabs (Warfarin sodium) .Marland Kitchen.. 1 by mouth once daily as directed 13)  Lofibra 134 Mg Caps (Fenofibrate micronized) .... One by mouth daily for high triglycerides 14)  Miralax Powd (Polyethylene glycol 3350) .Marland Kitchen.. 17g by mouth once daily for constipation disp one month supply  Labs:  CBC: 15.2>9.5/29.5<126 CMP: 135/4.2/106/19/23/2.01<261, T bili2.0, Alk P 105, AST 93, ALT 47, T pro 7.0, Alb 3.1, Ca 8.8 Troponin 0.16 CK 83, CK, MB 1.9 BNP 1441 Pro Time 19.6 INR 1.64 Digoxin Level 1.2 UA: Cloudy, glucose 250, protein >300, Small leukocytes Micro: 21-50 WBC, 3-6 RBC, rare bacteria.   Radiology: CXR Pa & La:  Cardiac enlargement with mild vascular congestion.  Small pleural   effusion.  Negative for edema. CT Abd/Pelvis wo:  1.  No clear acute abdominal or pelvic process.   2.  Small bilateral pleural effusions.   3.  Enlarged heart.   4.  Stable hypodensity in the liver.   5.  Gas within the bladder presumably related to the  catheterization.   6.  Right-sided bladder diverticulum.   7.  Sigmoid diverticulosis without acute diverticulitis  77 year old female with MMP who presents with SOB and abdominal pain, likely with CHF and right heart strain and a UTI: 1) SOB- CXR shows vascular congestion and BNP 1441, this is most likely a CHF exacerbation. Pt. recieved 40 mg of IV Lasix in ED with adequate response, we will place her in a telemetry bed, on oxygen and home Toresemide 50 mg by mouth daily. Monitor In's and Outs.   2) Troponin of .16- likely due to right heart strain from CHF exacerbation and chronic renal insufficiency.  Will start Heparin drip per pharmacy and cycle cardiac enzymes.   3) A-fib- pt. with pacer in place and currently rate controlled.  Continue Coreg and Digoxin. Pt on coumadin but INR 1.64, however pt. will be anticoagulated with heparin drip.    4)Leukocytosis and Fever, most likely due to UTI- Pt  recieved one dose of CTX in the ED.  Urine Culture sent. Continue Ceftriaxone until sensitivities come back on Urine culture.   5) Abdominal pain- unclear etiology at this time, but CT negative for acute abnormality.  Possibly due to UTI, however her LFT's are mildly elevated.  We will obtain an abdominal US to look for gall-bladder disease.   6) DM- SSI, continue home glimiprizide.   7) HLD- continue Lofibra, LFT's mildly elevated, will hold simvistatin. 8)HTN- continue Coreg and Digoxin.    9) Chronic Renal Failure- creatinine actually improved since last labs in June, we will continue to monitor.   10) FEN/GI- Will make pt. NPO until morning and SLIV.  11)Prophylaxis- on heparin drip.   12) Dispo- pending clinical improvement.    Appended Document: R2 addendum CC:  trouble breathing and abd pain  HPI:  77 yo w A. Fib s/p pacemaker, HTN, DM, Hx of CVA and MI presentes with 3-4 weeks of dyspnea and abdominal pain and general malaise.  Abdominal pain moderate located in left and right lower  abdomen.  Not asociated with meals or activity.  patient unable to describe abd pain well.   Nonproductive cough, no fever chills, n/v.  Does note diarrhea times 2 days black but this is chronic due to iron supplement.  Also notes some post-tussive emesis, dysuria.  Denies PND, edema, chest pain  In ER was 85% on RA.  Received Lasix 40 mg with good UOP.    PE:  Gen:  chronically ill appearing female in NAD HEENT:  OM dry.  EOMI, PERRLA, no LAD CV:  Irr irr, no m/r/g.  no JVD lungs:  crackles bases bilaterally abd:  + BS, soft.  tender to palpation in all 4 quadrants ext: no LE edema, Dp/PT pulses appreciated Neuro:  CN 2-12 grossly intact.  No focal findings.    Labs:  See Dr. Melina Modena notes above for labs and CT/xray findings.  EKG:  A fib with paced rhythm.    A/P: 77 yo w A. Fib s/p pacemaker, HTN, DM, Hx of CVA and MI presentes with 3-4 weeks of dyspnea and abdominal pain and general malaise.    1.)  Dyspnea:  Lung exam and CXR suggests some pulmonary edema consistant with elevated BNP with likely etiology being CHF with history of diastolic dysfunction.  got 40 mg lasix in ER with good response.  Will continue with Torsemide 50 mg daily at home dose and follow cr carefully- usually takes three days a week. Will admit to telemetry, cycle cardiac enzymes. Consider ECHO in AM.  2.  Elevated cardiac enzymes:  likely due to strain from CHF and reduced clearance given acute on chronic CKD.  EKG showing paced rhythm.  No chest pain.  Given risk factors will start  heparin drip and will follow CE.  If further elevations, will call cardiology.  3.  Dysuria:  U/A suggests urinary tract infection. Will continue ctx given in ER until cultures return.    4.  leukocytosis:  likely due to UTI.  She is also having diarrhea.   Will test for C. Diff  and fecal lactoferrin.  Will follow trend.    5.  CV:  A. fib with pace, HTN, CAD, Hx of MI.  Will hold benazepril in setting of acute renal failure.   Will continue lanoxin,  carvedilol   5.  DM:  will continue glipizide and add SSI.  A1C 9.5 July 27, 2009.  6. HLD:   hold fibrate  and statin for now, monitor LFT's.  obtain FLP  7. subtherapeutic INR:  On for A. fib.  Subtherapeutic on coumadin.  8.  Chronic renal failure:  Today 2.01.  This appears to be new baseline.  Cr 2.22 Jun2 2011.  2.8 in April 2011 .  9.  FEN/GI:  SLIV, NPO overnight, continue home omperazole  10.  Prophylaxis:  Heparin gtt  11.  Anemia:  of chronic disease 9.5.  10.4 in June 2011 at last check,  Will follow.  Continue iron supplements.  12.  Elevated LFT's:  physical exam not consistant with biliary etiology.  Will consider ordering RUQ [place] in AM.  Hold Statin for now.  13.  Dispo:  DNR/DNI.  Pending clinical improvement.    Appended Document: History and Physical     Clinical Lists Changes  Orders: Added new Test order of Basic Met-[facility] [phone]) - Signed Added new Test order of INR/PT-[facility] (14782) - Signed

## 2010-03-14 NOTE — Cardiovascular Report (Signed)
Summary: Office Visit   Office Visit   Imported By: Roderic Ovens 12/14/2009 16:19:02  _____________________________________________________________________  External Attachment:    Type:   Image     Comment:   External Document

## 2010-03-14 NOTE — Cardiovascular Report (Signed)
Summary: TTM   TTM   Imported By: Roderic Ovens 11/10/2009 16:17:49  _____________________________________________________________________  External Attachment:    Type:   Image     Comment:   External Document

## 2010-03-14 NOTE — Assessment & Plan Note (Signed)
Summary: f/u,df   Vital Signs:  Patient profile:   77 year old female Height:      65 inches Weight:      147.5 pounds BMI:     24.63 Temp:     97.8 degrees F oral Pulse rate:   56 / minute BP sitting:   85 / 45  (left arm) Cuff size:   regular  Vitals Entered By: Gladstone Pih (July 27, 2009 1:37 PM) CC: F/U Is Patient Diabetic? Yes Did you bring your meter with you today? No Pain Assessment Patient in pain? no        Primary Care Provider:  Doralee Albino MD  CC:  F/U.  History of Present Illness: C/O nocturia multiple times each night. Wants a walker CO lightheadedness on standing. States never got phone call from Dr.Brodie Still on HCTZ and full dose toresemide.  I changed as requested.  Habits & Providers  Alcohol-Tobacco-Diet     Tobacco Status: never  Current Medications (verified): 1)  Bayer Childrens Aspirin 81 Mg Chew (Aspirin) .... Take 1 Tablet By Mouth Once A Day 2)  Benazepril Hcl 20 Mg  Tabs (Benazepril Hcl) .... Take 1/2 Tab By Mouth Once Daily 3)  Glimepiride 4 Mg  Tabs (Glimepiride) .... One Tab By Mouth Daily 4)  Lanoxin 0.125 Mg Tabs (Digoxin) .... Take 1 Tablet By Mouth Once A Day 5)  Simvastatin 20 Mg Tabs (Simvastatin) .Marland Kitchen.. 1 By Mouth At Bedtime 6)  Klor-Con M20 20 Meq  Tbcr (Potassium Chloride Crys Cr) .... One By Mouth Daily 7)  Ferrous Sulfate 325 (65 Fe) Mg  Tbec (Ferrous Sulfate) .... One Tab Daily 8)  Carvedilol 25 Mg  Tabs (Carvedilol) .... 1/2 Pill By Mouth 2 Times Daily 9)  Fluticasone Propionate 50 Mcg/act  Susp (Fluticasone Propionate) .... 2 Sprays Each Nostril Once Daily Disp One Device 10)  Torsemide 100 Mg Tabs (Torsemide) .... Take 1/2 Tab By Mouth Once Daily 11)  Omeprazole 20 Mg Cpdr (Omeprazole) .... Once Daily 12)  Warfarin Sodium 2 Mg Tabs (Warfarin Sodium) .Marland Kitchen.. 1 By Mouth Once Daily As Directed 13)  Lofibra 134 Mg Caps (Fenofibrate Micronized) .... One By Mouth Daily For High Triglycerides 14)  Miralax   Powd  (Polyethylene Glycol 3350) .Marland Kitchen.. 17g By Mouth Once Daily For Constipation Disp One Month Supply 15)  Vicodin 5-500 Mg Tabs (Hydrocodone-Acetaminophen) .... One Po Bid As Needed Arthritis Pain  Allergies (verified): 1)  ! Tramadol Hcl (Tramadol Hcl) 2)  Codeine Phosphate (Codeine Phosphate) 3)  Paxil (Paroxetine Hcl) 4)  Hydrocodone  Past History:  Past medical, surgical, family and social histories (including risk factors) reviewed, and no changes noted (except as noted below).  Past Medical History: Reviewed history from 10/14/2008 and no changes required. encephalomalcia (old CVA on CT scan) 11/07  cardiac pacemaker, - Dr. Audree Camel cards macular hole left eye - told will lose sight needs endocarditis prophylaxis nonischemic cardiomyopathy Afib -OFF  anticoagulation presumed 2 to anemia Coronary atherosclerosis and old MI and hx CVA  Depression Dysphagia DM - neuropathy CRI - Cr baseline 1.5-1.6 HTN, HLD Incontinence 1. Status post St. Jude VVI pacemaker implantation. 2. Chronic atrial fibrillation. 3. Hypertrophic cardiomyopathy with normal left ventricular systolic     function. septal thickness 1.4cm 2009 4. History of congestive heart failure related to diastolic     dysfunction 5. Nonobstructive coronary artery disease by catheterization. 6. Hypertension. 7. Hyperlipidemia. 8. History of previous stroke. 9. Chronic anemia.  Osteoarthritis, degeneration lumbar sacral  area scolosis Hx falls Stasis dermatitis  Hearing loss anemia CHF: Cardiac Cath EF=25-30% - 08/10/2005, cardiolyte, EF=51% - 07/27/2002, echocardiogram EF=35-40% - 08/23/2004,  May 2008 EF 60% mod to severe MR, LA and RA markedly dilated, RV sys pressure reduced, dilated IVC, moderate TR  Past Surgical History: Reviewed history from 05/30/2007 and no changes required. bilateral foot surg - 08/19/2001 bladder tack - 08/19/2001,   Hysterectomy & BSO - 08/19/2001,  melanoma removed - 08/19/2001,  S/P -  09/09/2001 Pacemaker placement x 2   Family History: Reviewed history from 04/11/2006 and no changes required. MI, Ca, (colon) CVA, DM  Social History: Reviewed history from 05/30/2007 and no changes required. Lives in [place]; non smoker; non drinker. Lives with husband of 55 years. 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 Lungs:  Normal respiratory effort, chest expands symmetrically. Lungs are clear to auscultation, no crackles or wheezes. Heart:  Normal rate and regular rhythm. S1 and S2 normal without gallop, murmur, click, rub or other extra sounds. Extremities:  no edema   Impression & Recommendations:  Problem # 1:  CHRONIC KIDNEY DISEASE STAGE III (MODERATE) (ICD-585.3)  Decrease diuretic   Orders: [facility]- Est  Level 4 (16109)  Problem # 2:  SYNCOPE AND COLLAPSE (ICD-780.2) Concerned about overmedication.  Will discontinue norvasc  Problem # 3:  HYPERTENSION, BENIGN SYSTEMIC (ICD-401.1)  overtreated The following medications were removed from the medication list:    Norvasc 10 Mg Tabs (Amlodipine besylate) ..... One tab daily Her updated medication list for this problem includes:    Benazepril Hcl 20 Mg Tabs (Benazepril hcl) .Marland Kitchen... Take 1/2 tab by mouth once daily    Carvedilol 25 Mg Tabs (Carvedilol) .Marland Kitchen... 1/2 pill by mouth 2 times daily    Torsemide 100 Mg Tabs (Torsemide) .Marland Kitchen... Take 1/2 tab by mouth once daily  Orders: Comp Met-[facility] (60454-09811) [facility]- Est  Level 4 (91478)  Problem # 4:  ANEMIA NEC (ICD-285.8)  Her updated medication list for this problem includes:    Ferrous Sulfate 325 (65 Fe) Mg Tbec (Ferrous sulfate) ..... One tab daily  Orders: CBC-[facility] (29562) [facility]- Est  Level 4 (13086)  Problem # 5:  ATRIAL FIBRILLATION (ICD-427.31)  The following  medications were removed from the medication list:    Norvasc 10 Mg Tabs (Amlodipine besylate) ..... One tab daily Her updated medication list for this problem includes:    Bayer Childrens Aspirin 81 Mg Chew (Aspirin) .Marland Kitchen... Take 1 tablet by mouth once a day    Lanoxin 0.125 Mg Tabs (Digoxin) .Marland Kitchen... Take 1 tablet by mouth once a day    Carvedilol 25 Mg Tabs (Carvedilol) .Marland Kitchen... 1/2 pill by mouth 2 times daily    Warfarin Sodium 2 Mg Tabs (Warfarin sodium) .Marland Kitchen... 1 by mouth once daily as directed  Orders: Digoxin-[facility] (57846-96295) INR/PT-[facility] (28413) [facility]- Est  Level 4 (24401)  Problem # 6:  DIABETES MELLITUS, II, COMPLICATIONS (ICD-250.92)  Not at goal.  Can't take metformen.  Next step would be insulin.  Hold off for now. Her updated medication list for this problem includes:    Bayer Childrens Aspirin 81 Mg Chew (Aspirin) .Marland Kitchen... Take 1 tablet by mouth once a day    Benazepril Hcl 20 Mg Tabs (Benazepril hcl) .Marland Kitchen... Take 1/2 tab by mouth once daily    Glimepiride 4 Mg Tabs (Glimepiride) ..... One  tab by mouth daily  Orders: A1C-[facility] (16109) [facility]- Est  Level 4 (60454)  Labs Reviewed: Creat: 2.8 (06/01/2009)     Last Eye Exam: Results: Normal.  Date is arbitrary.  Patient reports seen around this time and normal exam.  (12/08/2008) Reviewed HgBA1c results: 9.4 (07/27/2009)  8.1 (01/12/2009)  Complete Medication List: 1)  Bayer Childrens Aspirin 81 Mg Chew (Aspirin) .... Take 1 tablet by mouth once a day 2)  Benazepril Hcl 20 Mg Tabs (Benazepril hcl) .... Take 1/2 tab by mouth once daily 3)  Glimepiride 4 Mg Tabs (Glimepiride) .... One tab by mouth daily 4)  Lanoxin 0.125 Mg Tabs (Digoxin) .... Take 1 tablet by mouth once a day 5)  Simvastatin 20 Mg Tabs (Simvastatin) .Marland Kitchen.. 1 by mouth at bedtime 6)  Klor-con M20 20 Meq Tbcr (Potassium chloride crys cr) .... One by mouth daily 7)  Ferrous Sulfate 325 (65 Fe) Mg Tbec (Ferrous sulfate) .... One tab daily 8)  Carvedilol 25 Mg Tabs (Carvedilol)  .... 1/2 pill by mouth 2 times daily 9)  Fluticasone Propionate 50 Mcg/act Susp (Fluticasone propionate) .... 2 sprays each nostril once daily disp one device 10)  Torsemide 100 Mg Tabs (Torsemide) .... Take 1/2 tab by mouth once daily 11)  Omeprazole 20 Mg Cpdr (Omeprazole) .... Once daily 12)  Warfarin Sodium 2 Mg Tabs (Warfarin sodium) .Marland Kitchen.. 1 by mouth once daily as directed 13)  Lofibra 134 Mg Caps (Fenofibrate micronized) .... One by mouth daily for high triglycerides 14)  Miralax Powd (Polyethylene glycol 3350) .Marland Kitchen.. 17g by mouth once daily for constipation disp one month supply 15)  Vicodin 5-500 Mg Tabs (Hydrocodone-acetaminophen) .... One po bid as needed arthritis pain  Patient Instructions: 1)  See me in one month.   2)  Get PT done in 2 weeks.  New dose of warfarin is 5 mg on Monday and Thursday and 2 mg other days. 3)  I will call with lab results Prescriptions: VICODIN 5-500 MG TABS (HYDROCODONE-ACETAMINOPHEN) one po bid as needed arthritis pain  #60 x 3   Entered and Authorized by:   Doralee Albino MD   Signed by:   Doralee Albino MD on 07/27/2009   Method used:   Handwritten   RxID:   [id number]   Laboratory Results   Blood Tests   Date/Time Received: July 27, 2009 1:45 PM  Date/Time Reported: July 27, 2009 2:23 PM   HGBA1C: 9.4%   (Normal Range: Non-Diabetic - 3-6%   Control Diabetic - 6-8%)  INR: 1.1   (Normal Range: 0.88-1.12   Therap INR: 2.0-3.5) Comments: ...........test performed by...........Marland KitchenTerese Door, CMA        ANTICOAGULATION RECORD PREVIOUS REGIMEN & LAB RESULTS Anticoagulation Diagnosis:  Deep venous thrombosis,arteriosclerosis on  05/17/2006 Previous INR Goal Range:  2 - 3 on  05/17/2006 Previous INR:  1.2 ratio on  06/01/2009 Previous Coumadin Dose(mg):  2 mg tablets on  04/06/2009 Previous Regimen:  continue:  2 mg daily on  04/06/2009 Previous Coagulation Comments:  Pt was given two options for taking her warfarin based on tablet  size (subject to getting new prescription for 2 mg tablets).  Called pt on 02-15-09 to find out which protocol she was using in order to make her return appt.  Pt's husband had not been able to get warfarin from the pharmacy - not sure why.  Pt has not taken any warfarin since leaving here on 02-10-09.  Restarted 2.5 mg on 02-15-09 and told pt I would  call again after discussing with Dr. [name].   Per Dr. [name], pt to take 2.5 mg every day  except Tues & Fri take none.  Recheck next week.  on  02/10/2009  NEW REGIMEN & LAB RESULTS Current INR: 1.1 Regimen: 5mg  Mon & Thurs; 2mg  other days  Provider: Dr. [name] Repeat testing in: 2 weeks Other Comments: ...........test performed by...........Marland KitchenTerese Door, CMA    Dose has been reviewed with patient or caretaker during this visit. Reviewed by: Dr. [name]

## 2010-03-14 NOTE — Assessment & Plan Note (Signed)
Summary: rov  Medications Added DEMADEX 100 MG TABS (TORSEMIDE) 1/2 by mouth daily DILTIAZEM HCL ER BEADS 180 MG XR24H-CAP (DILTIAZEM HCL ER BEADS) Take one capsule by mouth daily      Allergies Added:   Primary Provider:  Doralee Albino MD   History of Present Illness: The patient is 77 years old and return for management of hypertrophic heart myopathy in atrial fibrillation and her pacemaker. She has a severe hypertrophic cardiopathy with a septal thickness of 17 mm. 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  She has had no recent chest pain or palpitations. no complaints of SOB of edema since her hospital dischrge in 8/11 with CHF.   She is [place]  Score 5. Dr. [name] has her on low-dose Coumadin as a compromise and she is a borderline on Coumadin candidate. her INR is 1.8 today.   Current Medications (verified): 1)  Bayer Childrens Aspirin 81 Mg Chew (Aspirin) .... Take 1 Tablet By Mouth Once A Day 2)  Benazepril Hcl 20 Mg  Tabs (Benazepril Hcl) .... Take 1/2 Tab By Mouth Once Daily 3)  Glimepiride 4 Mg  Tabs (Glimepiride) .... One Tab By Mouth Daily 4)  Simvastatin 20 Mg Tabs (Simvastatin) .Marland Kitchen.. 1 By Mouth At Bedtime 5)  Ferrous Sulfate 325 (65 Fe) Mg  Tbec (Ferrous Sulfate) .... One Tab Daily 6)  Carvedilol 12.5 Mg Tabs (Carvedilol) .... One By Mouth Two Times A Day 7)  Demadex 100 Mg Tabs (Torsemide) .... 1/2 By Mouth Daily 8)  Omeprazole 20 Mg Cpdr (Omeprazole) .... Once Daily 9)  Warfarin Sodium 2 Mg Tabs (Warfarin Sodium) .Marland Kitchen.. 1 By Mouth Once Daily As Directed 10)  Lofibra 134 Mg Caps (Fenofibrate Micronized) .... One By Mouth Daily For High Triglycerides 11)  Miralax   Powd (Polyethylene Glycol 3350) .Marland Kitchen.. 17g By Mouth Once Daily For Constipation Disp One Month Supply 12)  Diltiazem Hcl Er Beads 180 Mg Xr24h-Cap (Diltiazem Hcl Er Beads) .... Take One Capsule By Mouth Daily  Allergies (verified): 1)  ! Tramadol Hcl (Tramadol  Hcl) 2)  ! Hydrocodone 3)  Codeine Phosphate (Codeine Phosphate) 4)  Paxil (Paroxetine Hcl)  Past History:  Past Medical History: Last updated: 10/14/2008 encephalomalcia (old CVA on CT scan) 11/07  cardiac pacemaker, - Dr. Audree Camel cards macular hole left eye - told will lose sight needs endocarditis prophylaxis nonischemic cardiomyopathy Afib -OFF  anticoagulation presumed 2 to anemia Coronary atherosclerosis and old MI and hx CVA  Depression Dysphagia DM - neuropathy CRI - Cr baseline 1.5-1.6 HTN, HLD Incontinence 1. Status post [employer] Jude VVI pacemaker implantation. 2. Chronic atrial fibrillation. 3. Hypertrophic cardiomyopathy with normal left ventricular systolic     function. septal thickness 1.4cm 2009 4. History of congestive heart failure related to diastolic     dysfunction 5. Nonobstructive coronary artery disease by catheterization. 6. Hypertension. 7. Hyperlipidemia. 8. History of previous stroke. 9. Chronic anemia.  Osteoarthritis, degeneration lumbar sacral area scolosis Hx falls Stasis dermatitis  Hearing loss anemia CHF: Cardiac Cath EF=25-30% - 08/10/2005, cardiolyte, EF=51% - 07/27/2002, echocardiogram EF=35-40% - 08/23/2004,  May 2008 EF 60% mod to severe MR, LA and RA markedly dilated, RV sys pressure reduced, dilated IVC, moderate TR  Review of Systems       As per HPI  Vital Signs:  Patient profile:   77 year old female Height:      68 inches Weight:      148 pounds BMI:  22.58 Pulse rate:   61 / minute Resp:     16 per minute BP sitting:   192 / 96  (left arm)  Vitals Entered By: Marrion Coy, CNA (December 08, 2009 10:51 AM)  Physical Exam  General:  Well-developed,well-nourished,in no acute distress; alert,appropriate and cooperative throughout examination Neck:  no JVD and no cervical lymphadenopathy.   Lungs:  Bibasilar crackles.   Heart:  irregularly irregular rhythm, normal rate. SEM at LLSB Abdomen:  soft, Right lower  quadrant tenderness to palpation.  + BS Pulses:  1+ radial pulses, 1+ dorsalis pedis pulses.  Extremities:  No LE edema.   Neurologic:  non focal   PPM Specifications Following MD:  Everardo Beals. Juanda Chance, MD     PPM Vendor:  [employer] Jude     PPM Model Number:  [id number]     PPM Serial Number:  [id number] PPM DOI:  04/27/2005     PPM Implanting MD:  Everardo Beals. Juanda Chance, MD  Lead 1    Location: RA     DOI: 09/17/1995     Model #: [id number]     Serial #: [id number]     Status: active Lead 2    Location: RV     DOI: 09/17/1995     Model #: [id number]     Serial #: [id number]     Status: active  Magnet Response Rate:  BOL 98.6 ERI 86.3  Indications:  HOCM   PPM Follow Up Battery Voltage:  2.78 V     Battery Est. Longevity:  5.25-7 yrs     Pacer Dependent:  No     Right Ventricle  Amplitude: 9.7 mV, Impedance: 403 ohms, Threshold: 0.625 V at 0.5 msec  Episodes MS Episodes:  0     Coumadin:  No Ventricular High Rate:  0     Ventricular Pacing:  35%  Parameters Mode:  VVIR     Lower Rate Limit:  50     Upper Rate Limit:  120 Next Cardiology Appt Due:  06/13/2010 Tech Comments:  NORMAL DEVICE FUNCTION.  NO EPISODES RECORDED.  NO CHANGES MADE. ROV IN 6 MTHS W/DEVICE CLINIC. Vella Kohler  December 08, 2009 11:20 AM  Impression & Recommendations:  Problem # 1:  CHF - EJECTION FRACTION < 50% (ICD-428.22) Assessment Comment Only Fluid status at baseline.    The following medications were removed from the medication list:    Lanoxin 0.125 Mg Tabs (Digoxin) .Marland Kitchen... Take 1 tablet by mouth every monday, weds and friday (three days per week.) Her updated medication list for this problem includes:    Bayer Childrens Aspirin 81 Mg Chew (Aspirin) .Marland Kitchen... Take 1 tablet by mouth once a day    Benazepril Hcl 20 Mg Tabs (Benazepril hcl) .Marland Kitchen... Take 1/2 tab by mouth once daily    Carvedilol 12.5 Mg Tabs (Carvedilol) ..... One by mouth two times a day    Demadex 100 Mg Tabs (Torsemide) .Marland Kitchen... 1/2 by mouth daily    Warfarin Sodium 2 Mg Tabs  (Warfarin sodium) .Marland Kitchen... 1 by mouth once daily as directed    Diltiazem Hcl Er Beads 180 Mg Xr24h-cap (Diltiazem hcl er beads) .Marland Kitchen... Take one capsule by mouth daily  Problem # 2:  HYPERTENSION, BENIGN SYSTEMIC (ICD-401.1) Assessment: Deteriorated BP elevated today, will d/c dig, and start cardizem at 180mg , and will titrate up for optimal BP control.   Her updated medication list for this problem includes:    Bayer Childrens Aspirin 81 Mg Chew (Aspirin) .Marland KitchenMarland KitchenMarland KitchenMarland Kitchen  Take 1 tablet by mouth once a day    Benazepril Hcl 20 Mg Tabs (Benazepril hcl) .Marland Kitchen... Take 1/2 tab by mouth once daily    Carvedilol 12.5 Mg Tabs (Carvedilol) ..... One by mouth two times a day    Demadex 100 Mg Tabs (Torsemide) .Marland Kitchen... 1/2 by mouth daily  Her updated medication list for this problem includes:    Bayer Childrens Aspirin 81 Mg Chew (Aspirin) .Marland Kitchen... Take 1 tablet by mouth once a day    Benazepril Hcl 20 Mg Tabs (Benazepril hcl) .Marland Kitchen... Take 1/2 tab by mouth once daily    Carvedilol 12.5 Mg Tabs (Carvedilol) ..... One by mouth two times a day    Demadex 100 Mg Tabs (Torsemide) .Marland Kitchen... 1/2 by mouth daily  Problem # 3:  PACEMAKER (ICD-V45.Marland Kitchen01) Assessment: Comment Only We interrogated her pacemaker today. She is in chronic fibrillation her rate is mainly in 50-70 and she is sensing about 10% of time and pacing about 30% of the time. There were no high rate episodes.  Her updated medication list for this problem includes:    Bayer Childrens Aspirin 81 Mg Chew (Aspirin) .Marland Kitchen... Take 1 tablet by mouth once a day    Benazepril Hcl 20 Mg Tabs (Benazepril hcl) .Marland Kitchen... 1 by mouth daily    Lanoxin 0.125 Mg Tabs (Digoxin) .Marland Kitchen... Take 1 tablet by mouth once a day    Norvasc 10 Mg Tabs (Amlodipine besylate) ..... One tab daily    Carvedilol 25 Mg Tabs (Carvedilol) .Marland Kitchen... 1/2 pill by mouth 2 times daily    Torsemide 100 Mg Tabs (Torsemide) ..... Once daily    Hydrochlorothiazide 25 Mg Tabs (Hydrochlorothiazide) .Marland Kitchen... Take 1 tab by mouth every  morning    Warfarin Sodium 2 Mg Tabs (Warfarin sodium) .Marland Kitchen... 1 by mouth once daily as directed  Problem # 4:  DIABETES MELLITUS, II, COMPLICATIONS (ICD-250.92) Assessment: Comment Only managed by pcp.  Her updated medication list for this problem includes:    Bayer Childrens Aspirin 81 Mg Chew (Aspirin) .Marland Kitchen... Take 1 tablet by mouth once a day    Benazepril Hcl 20 Mg Tabs (Benazepril hcl) .Marland Kitchen... Take 1/2 tab by mouth once daily    Glimepiride 4 Mg Tabs (Glimepiride) ..... One tab by mouth daily  Problem # 5:  ATRIAL FIBRILLATION (ICD-427.31) Assessment: Comment Only on coumadin, inr 1.8, she is rate controlled, will change dig with dilt today for better BP control.  The following medications were removed from the medication list:    Lanoxin 0.125 Mg Tabs (Digoxin) .Marland Kitchen... Take 1 tablet by mouth every monday, weds and friday (three days per week.) Her updated medication list for this problem includes:    Bayer Childrens Aspirin 81 Mg Chew (Aspirin) .Marland Kitchen... Take 1 tablet by mouth once a day    Carvedilol 12.5 Mg Tabs (Carvedilol) ..... One by mouth two times a day    Warfarin Sodium 2 Mg Tabs (Warfarin sodium) .Marland Kitchen... 1 by mouth once daily as directed  Patient Instructions: 1)  Stop lanoxin. 2)  Start Diltiazem ER (cardizem) 180mg  once daily. 3)  Your physician wants you to follow-up in: 6 months with Dr. Shirlee Latch.  You will receive a reminder letter in the mail two months in advance. If you don't receive a letter, please call our office to schedule the follow-up appointment. Prescriptions: DILTIAZEM HCL ER BEADS 180 MG XR24H-CAP (DILTIAZEM HCL ER BEADS) Take one capsule by mouth daily  #30 x 6   Entered by:   Sherri Rad,  RN, BSN   Authorized by:   Lenoria Farrier, MD, [facility]   Signed by:   Sherri Rad, RN, BSN on 12/08/2009   Method used:   Electronically to        [facility] (retail)       [address]       [place], [place]  [address]        Ph: [phone]       Fax: [phone]   RxID:   [id number]

## 2010-03-14 NOTE — Miscellaneous (Signed)
Summary: Eye doctor   Clinical Lists Changes Called patient to follow up optho referral.  Was seen several months ago and reportedly OK.  Will try to get release of information next visit.  Entered into [facility] form. Observations: Added new observation of DMEYEEXAMNXT: 12/2009 (03/18/2009 11:09) Added new observation of DIAB EYE EX: Results: Normal.  Date is arbitrary.  Patient reports seen around this time and normal exam.  (12/08/2008 11:12)      Prevention & Chronic Care Immunizations   Influenza vaccine: Fluvax 3+  (12/17/2008)   Influenza vaccine due: 11/11/2008    Tetanus booster: 08/12/2001: Done.   Tetanus booster due: 08/13/2011    Pneumococcal vaccine: Done.  (12/14/1999)   Pneumococcal vaccine due: None    H. zoster vaccine: Not documented  Colorectal Screening   Hemoccult: Done.  (10/13/2005)   Hemoccult due: Not Indicated    Colonoscopy: Location:  [facility].    (12/18/2007)   Colonoscopy due: 12/17/2017  Other Screening   Pap smear: Not documented   Pap smear due: Not Indicated    Mammogram: Done.  (05/13/2004)   Mammogram action/deferral: Not indicated  (10/29/2008)   Mammogram due: 05/13/2005    DXA bone density scan: Done.  (09/13/2002)   DXA bone density action/deferral: Not indicated  (10/29/2008)   DXA scan due: None    Smoking status: never  (12/08/2008)  Diabetes Mellitus   HgbA1C: 8.1  (01/12/2009)   HgbA1C action/deferral: Ordered  (12/17/2008)   Hemoglobin A1C due: 03/02/2008    Eye exam: Results: Normal.  Date is arbitrary.  Patient reports seen around this time and normal exam.   (12/08/2008)   Diabetic eye exam action/deferral: Ophthalmology referral  (10/29/2008)   Eye exam due: 12/2009    Foot exam: yes  (09/22/2008)   Foot exam action/deferral: Do today   High risk foot: No  (09/22/2008)   Foot care education: Done  (09/22/2008)    Urine microalbumin/creatinine ratio: Not documented   Urine microalbumin  action/deferral: Not indicated  Lipids   Total Cholesterol: 254  (12/17/2008)   Lipid panel action/deferral: Lipid Panel ordered   LDL: See Comment mg/dL  (04/54/0981)   LDL Direct: 61  (09/17/2007)   HDL: 37  (12/17/2008)   Triglycerides: 715  (12/17/2008)    SGOT (AST): 19  (05/07/2008)   SGPT (ALT): 17  (05/07/2008)   Alkaline phosphatase: 87  (05/07/2008)   Total bilirubin: 0.5  (05/07/2008)  Hypertension   Last Blood Pressure: 108 / 64  (12/17/2008)   Serum creatinine: 1.77  (12/17/2008)   Serum potassium 4.5  (12/17/2008)  Self-Management Support :   Personal Goals (by the next clinic visit) :     Personal A1C goal: 7  (10/29/2008)     Personal blood pressure goal: 130/80  (10/29/2008)     Personal LDL goal: 100  (10/29/2008)    Diabetes self-management support: Written self-care plan  (12/17/2008)    Hypertension self-management support: Written self-care plan  (12/17/2008)    Lipid self-management support: Written self-care plan  (12/17/2008)     Allergies: 1)  ! Tramadol Hcl (Tramadol Hcl) 2)  Codeine Phosphate (Codeine Phosphate) 3)  Paxil (Paroxetine Hcl)    Ophthalmology Exam  Procedure date:  12/08/2008  Findings:      Results: Normal.  Date is arbitrary.  Patient reports seen around this time and normal exam.   Procedures Next Due Date:    Ophthalmology Exam: 12/2009   Ophthalmology Exam  Procedure date:  12/08/2008  Findings:      Results: Normal.  Date is arbitrary.  Patient reports seen around this time and normal exam.   Procedures Next Due Date:    Ophthalmology Exam: 12/2009

## 2010-03-16 NOTE — Assessment & Plan Note (Addendum)
Summary: hfu,df   Vital Signs:  Patient profile:   77 year old female Height:      68 inches Weight:      152 pounds BMI:     23.20 O2 Sat:      96 % on 2 L/min Temp:     98.6 degrees F oral Pulse rate:   66 / minute BP sitting:   142 / 68  Vitals Entered By: Tessie Fass CMA (March 09, 2010 10:22 AM)  Serial Vital Signs/Assessments:  Time      Position  BP       Pulse  Resp  Temp     By                     142/68                         Majel Homer MD                                PEF    PreRx  PostRx Time      O2 Sat  O2 Type     L/min  L/min  L/min   By           86  %   Room air                          Majel Homer MD   History of Present Illness: Pt presents today for hospital followup for CHF, UTI, and PNA.  CHF:  Pt reports weighing herself daily.  Thinks she has gained about 5lbs, although her weight today is about the same as at hospital discharge.  Dry weight is felt to be 67.3kg.  No leg swelling, some cough that is nonproductive.  Still on 2l O2 [place].  UTI:  No dysuria, fevers, or chills.  Pt finished abx yesterday.  Does have problems with frequency, however this is a longstanding problem and has not changed recently.  PNA:  Continued SOB with exertion, continues on o2.  Cough as previously noted.  No fevers/chills, cough is nonproductive.  Current Medications (verified): 1)  Bayer Childrens Aspirin 81 Mg Chew (Aspirin) .... Take 1 Tablet By Mouth Once A Day 2)  Benazepril Hcl 20 Mg  Tabs (Benazepril Hcl) .... Take 1/2 Tab By Mouth Once Daily 3)  Glimepiride 4 Mg  Tabs (Glimepiride) .... One Tab By Mouth Daily 4)  Simvastatin 20 Mg Tabs (Simvastatin) .Marland Kitchen.. 1 By Mouth At Bedtime 5)  Ferrous Sulfate 325 (65 Fe) Mg  Tbec (Ferrous Sulfate) .... One Tab Daily 6)  Demadex 100 Mg Tabs (Torsemide) .Marland Kitchen.. 1 Tab By Mouth Daily For Fluid Retention 7)  Omeprazole 20 Mg Cpdr (Omeprazole) .... Once Daily 8)  Warfarin Sodium 2 Mg Tabs (Warfarin Sodium) .Marland Kitchen.. 1 By Mouth Once Daily  As Directed 9)  Lofibra 134 Mg Caps (Fenofibrate Micronized) .... One By Mouth Daily For High Triglycerides 10)  Miralax   Powd (Polyethylene Glycol 3350) .Marland Kitchen.. 17g By Mouth Once Daily For Constipation Disp One Month Supply 11)  Diltiazem Hcl Er Beads 180 Mg Xr24h-Cap (Diltiazem Hcl Er Beads) .... Take One Capsule By Mouth Daily 12)  Metoprolol Tartrate 25 Mg Tabs (Metoprolol Tartrate) .... Take 75mg  By Mouth Two Times A Day For Blood Pressure 13)  Colace 100 Mg Caps (Docusate Sodium) .Marland KitchenMarland KitchenMarland Kitchen  1 By Mouth Two Times A Day As Needed Constipation 14)  Small Portable Oxygen .... 2 Liters, Dx 428.22 86% On Ra 15)  Oxygen Concentrator .... Dx 428.22  Allergies (verified): 1)  ! Tramadol Hcl (Tramadol Hcl) 2)  ! Hydrocodone 3)  Codeine Phosphate (Codeine Phosphate) 4)  Paxil (Paroxetine Hcl)  Past History:  Past medical, surgical, family and social histories (including risk factors) reviewed for relevance to current acute and chronic problems.  Past Medical History: Reviewed history from 10/14/2008 and no changes required. encephalomalcia (old CVA on CT scan) 11/07  cardiac pacemaker, - Dr. Audree Camel cards macular hole left eye - told will lose sight needs endocarditis prophylaxis nonischemic cardiomyopathy Afib -OFF  anticoagulation presumed 2 to anemia Coronary atherosclerosis and old MI and hx CVA  Depression Dysphagia DM - neuropathy CRI - Cr baseline 1.5-1.6 HTN, HLD Incontinence 1. Status post St. Jude VVI pacemaker implantation. 2. Chronic atrial fibrillation. 3. Hypertrophic cardiomyopathy with normal left ventricular systolic     function. septal thickness 1.4cm 2009 4. History of congestive heart failure related to diastolic     dysfunction 5. Nonobstructive coronary artery disease by catheterization. 6. Hypertension. 7. Hyperlipidemia. 8. History of previous stroke. 9. Chronic anemia.  Osteoarthritis, degeneration lumbar sacral area scolosis Hx falls Stasis  dermatitis  Hearing loss anemia CHF: Cardiac Cath EF=25-30% - 08/10/2005, cardiolyte, EF=51% - 07/27/2002, echocardiogram EF=35-40% - 08/23/2004,  May 2008 EF 60% mod to severe MR, LA and RA markedly dilated, RV sys pressure reduced, dilated IVC, moderate TR  Past Surgical History: Reviewed history from 05/30/2007 and no changes required. bilateral foot surg - 08/19/2001 bladder tack - 08/19/2001,   Hysterectomy & BSO - 08/19/2001,  melanoma removed - 08/19/2001,  S/P - 09/09/2001 Pacemaker placement x 2   Family History: Reviewed history from 04/11/2006 and no changes required. MI, Ca, (colon) CVA, DM  Social History: Reviewed history from 05/30/2007 and no changes required. Lives in [place]; non smoker; non drinker. Lives with husband of 55 years. 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   Review of Systems       The patient complains of weight gain and dyspnea on exertion.  The patient denies anorexia, fever, weight loss, vision loss, decreased hearing, hoarseness, chest pain, syncope, peripheral edema, prolonged cough, headaches, hemoptysis, abdominal pain, melena, hematochezia, severe indigestion/heartburn, hematuria, and incontinence.    Physical Exam  General:  Vitals reviewed, NAD, on o2 by [place], alert and cooperative Head:  Normocephalic and atraumatic without obvious abnormalities. Neck:  large neck girth, no JVD, no visible JVD Lungs:  CTAB, no wheezes or crackles.  No accessory muscle use.  Heart:  irregularly irregular rhythm, no murmur Abdomen:  Obese, NT, + bowels sounds Pulses:  2+ radial and posterior tibial bilaterally Extremities:  trace left pedal edema, left pretibial edema, trace right pedal edema, and right pretibial edema.   Neurologic:  alert & oriented X3 and cranial nerves II-XII intact.   Psych:  Oriented X3, memory intact for recent and remote, normally interactive, and good eye contact.     Impression & Recommendations:  Problem # 1:  CHF - EJECTION FRACTION <  50% (ICD-428.22) Weight at baseline.  Will continue daily weights with patient instructed to call for change greater than 5lbs.  Dry weight at discharge felt to be 67.3kg, weight today is 69kg.  It is unclear why the patient was changed from coreg to metoprolol while in the hospital.  At some point in the future it would  probably be appropriate to switch back, especially considering that the patient has a new o2 requirement.  The following medications were removed from the medication list:    Carvedilol 12.5 Mg Tabs (Carvedilol) ..... One by mouth two times a day Her updated medication list for this problem includes:    Bayer Childrens Aspirin 81 Mg Chew (Aspirin) .Marland Kitchen... Take 1 tablet by mouth once a day    Benazepril Hcl 20 Mg Tabs (Benazepril hcl) .Marland Kitchen... Take 1/2 tab by mouth once daily    Demadex 100 Mg Tabs (Torsemide) .Marland Kitchen... 1 tab by mouth daily for fluid retention    Warfarin Sodium 2 Mg Tabs (Warfarin sodium) .Marland Kitchen... 1 by mouth once daily as directed    Metoprolol Tartrate 25 Mg Tabs (Metoprolol tartrate) .Marland Kitchen... Take 75mg  by mouth two times a day for blood pressure  Problem # 2:  DIABETES MELLITUS, II, COMPLICATIONS (ICD-250.92) Assessment: Improved A1C 6.7% continue current meds.  Her updated medication list for this problem includes:    Bayer Childrens Aspirin 81 Mg Chew (Aspirin) .Marland Kitchen... Take 1 tablet by mouth once a day    Benazepril Hcl 20 Mg Tabs (Benazepril hcl) .Marland Kitchen... Take 1/2 tab by mouth once daily    Glimepiride 4 Mg Tabs (Glimepiride) ..... One tab by mouth daily  Labs Reviewed: Creat: 1.72 (12/08/2009)     Last Eye Exam: Results: Normal.  Date is arbitrary.  Patient reports seen around this time and normal exam.  (12/08/2008) Reviewed HgBA1c results: 9.4 (07/27/2009)  8.1 (01/12/2009)  Problem # 3:  HYPERTENSION, BENIGN SYSTEMIC (ICD-401.1) BP OK today.  Will continue to monitor and continue all home medications.  The following medications were removed from the medication  list:    Carvedilol 12.5 Mg Tabs (Carvedilol) ..... One by mouth two times a day Her updated medication list for this problem includes:    Benazepril Hcl 20 Mg Tabs (Benazepril hcl) .Marland Kitchen... Take 1/2 tab by mouth once daily    Demadex 100 Mg Tabs (Torsemide) .Marland Kitchen... 1 tab by mouth daily for fluid retention    Diltiazem Hcl Er Beads 180 Mg Xr24h-cap (Diltiazem hcl er beads) .Marland Kitchen... Take one capsule by mouth daily    Metoprolol Tartrate 25 Mg Tabs (Metoprolol tartrate) .Marland Kitchen... Take 75mg  by mouth two times a day for blood pressure  Problem # 4:  ATRIAL FIBRILLATION (ICD-427.31) In afib today.  INR is therapeutic.  Will continue current medications.  The following medications were removed from the medication list:    Carvedilol 12.5 Mg Tabs (Carvedilol) ..... One by mouth two times a day Her updated medication list for this problem includes:    Bayer Childrens Aspirin 81 Mg Chew (Aspirin) .Marland Kitchen... Take 1 tablet by mouth once a day    Warfarin Sodium 2 Mg Tabs (Warfarin sodium) .Marland Kitchen... 1 by mouth once daily as directed    Diltiazem Hcl Er Beads 180 Mg Xr24h-cap (Diltiazem hcl er beads) .Marland Kitchen... Take one capsule by mouth daily    Metoprolol Tartrate 25 Mg Tabs (Metoprolol tartrate) .Marland Kitchen... Take 75mg  by mouth two times a day for blood pressure  Problem # 5:  BACTERIAL PNEUMONIA (ICD-482.9) Pt still has an o2 requirement when exerted.  Will write for a more portable o2 source and recheck need at next visit.  Pt did not have any o2 requirement prior to hospitalization so we are hopeful that the patient will recover lung function with time.  Complete Medication List: 1)  Bayer Childrens Aspirin 81 Mg Chew (Aspirin) .... Take 1  tablet by mouth once a day 2)  Benazepril Hcl 20 Mg Tabs (Benazepril hcl) .... Take 1/2 tab by mouth once daily 3)  Glimepiride 4 Mg Tabs (Glimepiride) .... One tab by mouth daily 4)  Simvastatin 20 Mg Tabs (Simvastatin) .Marland Kitchen.. 1 by mouth at bedtime 5)  Ferrous Sulfate 325 (65 Fe) Mg Tbec (Ferrous  sulfate) .... One tab daily 6)  Demadex 100 Mg Tabs (Torsemide) .Marland Kitchen.. 1 tab by mouth daily for fluid retention 7)  Omeprazole 20 Mg Cpdr (Omeprazole) .... Once daily 8)  Warfarin Sodium 2 Mg Tabs (Warfarin sodium) .Marland Kitchen.. 1 by mouth once daily as directed 9)  Lofibra 134 Mg Caps (Fenofibrate micronized) .... One by mouth daily for high triglycerides 10)  Miralax Powd (Polyethylene glycol 3350) .Marland Kitchen.. 17g by mouth once daily for constipation disp one month supply 11)  Diltiazem Hcl Er Beads 180 Mg Xr24h-cap (Diltiazem hcl er beads) .... Take one capsule by mouth daily 12)  Metoprolol Tartrate 25 Mg Tabs (Metoprolol tartrate) .... Take 75mg  by mouth two times a day for blood pressure 13)  Colace 100 Mg Caps (Docusate sodium) .Marland Kitchen.. 1 by mouth two times a day as needed constipation 14)  Small Portable Oxygen  .... 2 liters, dx 428.22 86% on ra 15)  Oxygen Concentrator  .... Dx 045.40  Patient Instructions: 1)  It was great to see you today! 2)  We will try to get you a smaller oxygen container so it will be easier to get around.  Keep on using the oxygen to avoid having problems breathing when you are walking. 3)  Keep on weighing yourself daily.  If your weight changes by more than 5 lbs, give [place] a call.  We may need to adjust your fluid pill or get you in to check your weight again. 4)  We are not making any changes to your medications. 5)  Make an appointment to see Dr. [name] in about 3 weeks. Prescriptions: OXYGEN CONCENTRATOR Dx 428.22  #1 x 0   Entered and Authorized by:   Majel Homer MD   Signed by:   Majel Homer MD on 03/09/2010   Method used:   Print then Give to Patient   RxID:   [id number] SMALL PORTABLE OXYGEN 2 liters, Dx 428.22 86% on RA  #1 x 3   Entered and Authorized by:   Majel Homer MD   Signed by:   Majel Homer MD on 03/09/2010   Method used:   Print then Give to Patient   RxID:   [id number]   Appended Document: Orders Update     Clinical Lists  Changes  Orders: Added new Test order of [facility]  Level 4 (13244) - Signed

## 2010-03-16 NOTE — Assessment & Plan Note (Signed)
Summary: cough/congestion,df   Vital Signs:  Patient profile:   77 year old female Weight:      160 pounds O2 Sat:      80 % on Room air Temp:     98.5 degrees F oral Pulse rate:   90 / minute Pulse rhythm:   regular BP sitting:   156 / 74  (left arm) Cuff size:   regular  Vitals Entered By: Loralee Pacas CMA (February 22, 2010 2:53 PM)  O2 Flow:  Room air CC: cough and congestion   Primary Care Provider:  Doralee Albino MD  CC:  cough and congestion.  History of Present Illness: Patient with 2 weeks of productive cough, congestion, subjective fever.  Unable to lay flat.  Spouse is also ill with a respiratory illness.  Current Medications (verified): 1)  Bayer Childrens Aspirin 81 Mg Chew (Aspirin) .... Take 1 Tablet By Mouth Once A Day 2)  Benazepril Hcl 20 Mg  Tabs (Benazepril Hcl) .... Take 1/2 Tab By Mouth Once Daily 3)  Glimepiride 4 Mg  Tabs (Glimepiride) .... One Tab By Mouth Daily 4)  Simvastatin 20 Mg Tabs (Simvastatin) .Marland Kitchen.. 1 By Mouth At Bedtime 5)  Ferrous Sulfate 325 (65 Fe) Mg  Tbec (Ferrous Sulfate) .... One Tab Daily 6)  Carvedilol 12.5 Mg Tabs (Carvedilol) .... One By Mouth Two Times A Day 7)  Demadex 100 Mg Tabs (Torsemide) .... 1/2 By Mouth Daily 8)  Omeprazole 20 Mg Cpdr (Omeprazole) .... Once Daily 9)  Warfarin Sodium 2 Mg Tabs (Warfarin Sodium) .Marland Kitchen.. 1 By Mouth Once Daily As Directed 10)  Lofibra 134 Mg Caps (Fenofibrate Micronized) .... One By Mouth Daily For High Triglycerides 11)  Miralax   Powd (Polyethylene Glycol 3350) .Marland Kitchen.. 17g By Mouth Once Daily For Constipation Disp One Month Supply 12)  Diltiazem Hcl Er Beads 180 Mg Xr24h-Cap (Diltiazem Hcl Er Beads) .... Take One Capsule By Mouth Daily  Allergies (verified): 1)  ! Tramadol Hcl (Tramadol Hcl) 2)  ! Hydrocodone 3)  Codeine Phosphate (Codeine Phosphate) 4)  Paxil (Paroxetine Hcl)  Past History:  Past medical, surgical, family and social histories (including risk factors) reviewed, and no  changes noted (except as noted below).  Past Medical History: Reviewed history from 10/14/2008 and no changes required. encephalomalcia (old CVA on CT scan) 11/07  cardiac pacemaker, - Dr. Audree Camel cards macular hole left eye - told will lose sight needs endocarditis prophylaxis nonischemic cardiomyopathy Afib -OFF  anticoagulation presumed 2 to anemia Coronary atherosclerosis and old MI and hx CVA  Depression Dysphagia DM - neuropathy CRI - Cr baseline 1.5-1.6 HTN, HLD Incontinence 1. Status post St. Jude VVI pacemaker implantation. 2. Chronic atrial fibrillation. 3. Hypertrophic cardiomyopathy with normal left ventricular systolic     function. septal thickness 1.4cm 2009 4. History of congestive heart failure related to diastolic     dysfunction 5. Nonobstructive coronary artery disease by catheterization. 6. Hypertension. 7. Hyperlipidemia. 8. History of previous stroke. 9. Chronic anemia.  Osteoarthritis, degeneration lumbar sacral area scolosis Hx falls Stasis dermatitis  Hearing loss anemia CHF: Cardiac Cath EF=25-30% - 08/10/2005, cardiolyte, EF=51% - 07/27/2002, echocardiogram EF=35-40% - 08/23/2004,  May 2008 EF 60% mod to severe MR, LA and RA markedly dilated, RV sys pressure reduced, dilated IVC, moderate TR  Past Surgical History: Reviewed history from 05/30/2007 and no changes required. bilateral foot surg - 08/19/2001 bladder tack - 08/19/2001,   Hysterectomy & BSO - 08/19/2001,  melanoma removed - 08/19/2001,  S/P -  09/09/2001 Pacemaker placement x 2   Family History: Reviewed history from 04/11/2006 and no changes required. MI, Ca, (colon) CVA, DM  Social History: Reviewed history from 05/30/2007 and no changes required. Lives in [place]; non smoker; non drinker. Lives with husband of 55 years. 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   Physical Exam  General:  Mild resp distress at rest.  Vitals including 12 lb wt gain noted.  Pulse ox = 80% improved to  86% with deep breaths. Neck:  No deformities, masses, or tenderness noted. Lungs:  Bibasilar crackles. Heart:  Irreg rhythym, normal rate Extremities:  2+ edema   Impression & Recommendations:  Problem # 1:  CHF - EJECTION FRACTION < 50% (ICD-428.22)  Acute on chronic CHF with likely superimposed pneumonia. Her updated medication list for this problem includes:    Bayer Childrens Aspirin 81 Mg Chew (Aspirin) .Marland Kitchen... Take 1 tablet by mouth once a day    Benazepril Hcl 20 Mg Tabs (Benazepril hcl) .Marland Kitchen... Take 1/2 tab by mouth once daily    Carvedilol 12.5 Mg Tabs (Carvedilol) ..... One by mouth two times a day    Demadex 100 Mg Tabs (Torsemide) .Marland Kitchen... 1/2 by mouth daily    Warfarin Sodium 2 Mg Tabs (Warfarin sodium) .Marland Kitchen... 1 by mouth once daily as directed  Orders: Hospital Admit-[facility] (00000)  Echocardiogram:  Study Conclusions    1. Left ventricle: Wall thickness was increased in a pattern of      severe LVH. There was severe asymmetric hypertrophy of the      septum. Systolic function was normal. The estimated ejection      fraction was in the range of 55% to 60%.   2. Mitral valve: Mild regurgitation.   3. Left atrium: The atrium was moderately dilated.   4. Right atrium: The atrium was mildly dilated.   5. Pulmonary arteries: PA peak pressure: 53mm Hg (S).   6. Pericardium, extracardiac: A trivial pericardial effusion was      identified.   Transthoracic echocardiography. M-mode, complete 2D, spectral   Doppler, and color Doppler. Height: Height: 165.1cm. Height: 65in.   Weight: Weight: 75.5kg. Weight: 166.1lb. Body mass index: BMI:   27.7kg/m^2. Body surface area: BSA: 1.64m^2. Patient status:   Inpatient. Location: Echo laboratory. (10/25/2008)  Complete Medication List: 1)  Bayer Childrens Aspirin 81 Mg Chew (Aspirin) .... Take 1 tablet by mouth once a day 2)  Benazepril Hcl 20 Mg Tabs (Benazepril hcl) .... Take 1/2 tab by mouth once daily 3)  Glimepiride 4 Mg Tabs  (Glimepiride) .... One tab by mouth daily 4)  Simvastatin 20 Mg Tabs (Simvastatin) .Marland Kitchen.. 1 by mouth at bedtime 5)  Ferrous Sulfate 325 (65 Fe) Mg Tbec (Ferrous sulfate) .... One tab daily 6)  Carvedilol 12.5 Mg Tabs (Carvedilol) .... One by mouth two times a day 7)  Demadex 100 Mg Tabs (Torsemide) .... 1/2 by mouth daily 8)  Omeprazole 20 Mg Cpdr (Omeprazole) .... Once daily 9)  Warfarin Sodium 2 Mg Tabs (Warfarin sodium) .Marland Kitchen.. 1 by mouth once daily as directed 10)  Lofibra 134 Mg Caps (Fenofibrate micronized) .... One by mouth daily for high triglycerides 11)  Miralax Powd (Polyethylene glycol 3350) .Marland Kitchen.. 17g by mouth once daily for constipation disp one month supply 12)  Diltiazem Hcl Er Beads 180 Mg Xr24h-cap (Diltiazem hcl er beads) .... Take one capsule by mouth daily   Orders Added: 1)  Hospital Admit-[facility] [00000]

## 2010-03-16 NOTE — Miscellaneous (Signed)
Summary: Labs   Clinical Lists Changes  Problems: Added new problem of COUMADIN THERAPY (ICD-V58.61) Orders: Added new Test order of Basic Met-[facility] [phone]) - Signed Added new Test order of INR/PT-[facility] (29562) - Signed Added new Test order of A1C-[facility] (13086) - Signed Added new Test order of Direct LDL-[facility] ([id number]) - Signed

## 2010-03-16 NOTE — Cardiovascular Report (Signed)
Summary: TTM   TTM   Imported By: Roderic Ovens 02/28/2010 12:12:36  _____________________________________________________________________  External Attachment:    Type:   Image     Comment:   External Document

## 2010-03-16 NOTE — Assessment & Plan Note (Signed)
Summary: Admission H&P: Increased WOB   Vital Signs:  Patient profile:   77 year old female O2 Sat:      94 % on 2 L/min Temp:     97.7 degrees F Pulse rate:   94 / minute BP supine:   190 / 70  O2 Flow:  2 L/min  Primary Care Provider:  Doralee Albino MD   History of Present Illness: 6 YOF with PMhx/o CHF s/p pacemaker with 2-3 week hx/o progressive dyspnea and increased WOB. Pt states that she was in baseline state of health prior this. Pt denies any increased sodium intake or missing medication. Pt states that she has noticed increased swelling in legs as well as worsening cough. No sputum production. Pt states that she usually sleeps in a chair, however has noticed increased WOB even while sitting. No CP per pt. Pt has reported intermittent chest pressure over this time period. Pt also reports dysuria over this time period. No increased frequency. No abd/flank pain. No N/V/D. No fevers per pt. No blood in urine. Pt reports hx/o UTIs in the past.   Allergies: 1)  ! Tramadol Hcl (Tramadol Hcl) 2)  ! Hydrocodone 3)  Codeine Phosphate (Codeine Phosphate) 4)  Paxil (Paroxetine Hcl)  Past History:  Past Medical History: Last updated: 10/14/2008 encephalomalcia (old CVA on CT scan) 11/07  cardiac pacemaker, - Dr. Audree Camel cards macular hole left eye - told will lose sight needs endocarditis prophylaxis nonischemic cardiomyopathy Afib -OFF  anticoagulation presumed 2 to anemia Coronary atherosclerosis and old MI and hx CVA  Depression Dysphagia DM - neuropathy CRI - Cr baseline 1.5-1.6 HTN, HLD Incontinence 1. Status post St. Jude VVI pacemaker implantation. 2. Chronic atrial fibrillation. 3. Hypertrophic cardiomyopathy with normal left ventricular systolic     function. septal thickness 1.4cm 2009 4. History of congestive heart failure related to diastolic     dysfunction 5. Nonobstructive coronary artery disease by catheterization. 6. Hypertension. 7.  Hyperlipidemia. 8. History of previous stroke. 9. Chronic anemia.  Osteoarthritis, degeneration lumbar sacral area scolosis Hx falls Stasis dermatitis  Hearing loss anemia CHF: Cardiac Cath EF=25-30% - 08/10/2005, cardiolyte, EF=51% - 07/27/2002, echocardiogram EF=35-40% - 08/23/2004,  May 2008 EF 60% mod to severe MR, LA and RA markedly dilated, RV sys pressure reduced, dilated IVC, moderate TR  Past Surgical History: Last updated: 05/30/2007 bilateral foot surg - 08/19/2001 bladder tack - 08/19/2001,   Hysterectomy & BSO - 08/19/2001,  melanoma removed - 08/19/2001,  S/P - 09/09/2001 Pacemaker placement x 2   Family History: Last updated: 04/11/2006 MI, Ca, (colon) CVA, DM  Social History: Last updated: 05/30/2007 Lives in [place]; non smoker; non drinker. Lives with husband of 55 years. 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   Risk Factors: Alcohol Use: <1 (08/31/2009)  Risk Factors: Smoking Status: never (07/27/2009)  Review of Systems       Negative except as noted above in HPI  Physical Exam  General:  In bed, in minimal distress secondary to increased WOB,  Eyes:  PERRL Mouth:  moist mucus membranes  Neck:  large neck girth, no JVD, no visible JVD Lungs:  CTAB, diffuse expiratory wheezes, no crackles  Heart:  irregularly irregular rhythm, no murmur Abdomen:  Obese, NT, + bowels sounds, no suprapubic tenderness.  Extremities:  2-3+ pitting edema bilaterally  Neurologic:  alert & oriented X3.  grossly normal neuro exam.    Impression & Recommendations:  Problem # 1:  Cardiopulmonary Pt with likely CHF exacerbation.  Noted approx 10 lb weight gain since last clinical visit. Will obtain BNP, CXR, strict I&Os, daily weights. Will place pt on lasix IV for diuresis. Will obtain EKG. Will consider transition to IV demadex if there is minimal diuresis over the next 24 hours. Will cycle cardiac enzymes. Will obtain CXR to rule out PNA. Will continue coumadin in setting of hx/o  afib. WIll place on telemetry bed. Will hold ACEis/ARBs and NSAIDs while diuresing. Will continue home coreg and diltiazem.   Problem # 2:  Infectious Disease WIll check UA as well as CXR to rule out PNA and UTI. Will obtain CBC. Afebrile on presentation. Will hold on starting abx pending labs and imaging.   Problem # 3:  FEN/GI HLIVF. low sodium diet.  PPI  Problem # 4:  ENDO Cont home glimeperide. Will place on SSI. Will also obtain A1C.   Problem # 5:  PPX On coumadin. Will check INR. PPI  Problem # 6:  DISPO Pending further evaluation.   Complete Medication List: 1)  Bayer Childrens Aspirin 81 Mg Chew (Aspirin) .... Take 1 tablet by mouth once a day 2)  Benazepril Hcl 20 Mg Tabs (Benazepril hcl) .... Take 1/2 tab by mouth once daily 3)  Glimepiride 4 Mg Tabs (Glimepiride) .... One tab by mouth daily 4)  Simvastatin 20 Mg Tabs (Simvastatin) .Marland Kitchen.. 1 by mouth at bedtime 5)  Ferrous Sulfate 325 (65 Fe) Mg Tbec (Ferrous sulfate) .... One tab daily 6)  Carvedilol 12.5 Mg Tabs (Carvedilol) .... One by mouth two times a day 7)  Demadex 100 Mg Tabs (Torsemide) .... 1/2 by mouth daily 8)  Omeprazole 20 Mg Cpdr (Omeprazole) .... Once daily 9)  Warfarin Sodium 2 Mg Tabs (Warfarin sodium) .Marland Kitchen.. 1 by mouth once daily as directed 10)  Lofibra 134 Mg Caps (Fenofibrate micronized) .... One by mouth daily for high triglycerides 11)  Miralax Powd (Polyethylene glycol 3350) .Marland Kitchen.. 17g by mouth once daily for constipation disp one month supply 12)  Diltiazem Hcl Er Beads 180 Mg Xr24h-cap (Diltiazem hcl er beads) .... Take one capsule by mouth daily  Appended Document: Admission H&P: Increased WOB BP Correction: 170/90 Heme: Baseline hgb 10-11. WIll check CBC  Appended Document: Admission H&P: Increased WOB Dict # [id number]

## 2010-03-23 ENCOUNTER — Telehealth: Payer: Self-pay | Admitting: Family Medicine

## 2010-03-24 ENCOUNTER — Telehealth: Payer: Self-pay | Admitting: Family Medicine

## 2010-03-24 NOTE — Telephone Encounter (Signed)
I am not available on 2/16.  If she can change to Friday 2/17, I would be willing to see her.  OK to double book.

## 2010-03-24 NOTE — Telephone Encounter (Signed)
Created encounter in error

## 2010-03-24 NOTE — Telephone Encounter (Signed)
Erin Dr. [name] wrote:  I am not available on 2/16.  If she can change to Friday 2/17, I would be willing to see her.  OK to double book.  Can you please schedule this pt :-)  ~T

## 2010-03-25 ENCOUNTER — Encounter: Payer: Self-pay | Admitting: Family Medicine

## 2010-03-25 DIAGNOSIS — I2510 Atherosclerotic heart disease of native coronary artery without angina pectoris: Secondary | ICD-10-CM

## 2010-03-25 DIAGNOSIS — Z7901 Long term (current) use of anticoagulants: Secondary | ICD-10-CM | POA: Insufficient documentation

## 2010-03-25 DIAGNOSIS — I4891 Unspecified atrial fibrillation: Secondary | ICD-10-CM

## 2010-03-28 NOTE — Telephone Encounter (Signed)
Pt could not come on the 17th but did schedule for the 29th.

## 2010-03-29 NOTE — H&P (Signed)
[name], [name]                 ACCOUNT NO.:  [id number]  MEDICAL RECORD NO.:  [id number]          PATIENT TYPE:  INP  LOCATION:  6739                         FACILITY:  [facility]  PHYSICIAN:  Pearlean Brownie, M.D.DATE OF BIRTH:  [birth year]  DATE OF ADMISSION:  02/22/2010 DATE OF DISCHARGE:                             HISTORY & PHYSICAL   PRIMARY CARE PROIVDER:  Santiago Bumpers. [name], M.D. at [facility].  CHIEF COMPLAINT:  Increased work of breathing.  HISTORY OF PRESENT ILLNESS:  This is a 77 year old female with past medical history of CHF, status post pacemaker with 2 to 3-week history of progressive dyspnea and increased work of breathing.  The patient states she was in otherwise baseline state of health prior to this episode, being able to ambulate up to 50 feet with minimal exertion, dyspnea.  Prior to this, the patient denies any increased sodium intake or missing out medications.  The patient states that she has increased swelling in legs as well as worsening cough, no sputum production. Patient states she usually sits upright in chair; however, the patient is also noticing of increased work of breathing even while sitting upright.  The patient denies any chest pain.  The patient has reported intermittent chest pressure at this time.  The patient also reports dysuria over this time.  No increased frequency.  No abdominal or flank pain.  No nausea, vomiting, or diarrhea.  No fevers per the patient.  No blood in urine.  The patient does report history of UTI in the past.  Of note, the patient did come in to clinic today to be evaluated at [facility].  The patient is having a needle oxygen requirement.  The patient is satting in the mid 80s on room air.  The patient being placed on 2 liters of nasal cannula to increase O2 saturations to the mid 90s.  The patient does not require oxygen now.  ALLERGIES: 1. TRAMADOL. 2.  HYDROCODONE. 3. CODEINE. 4. PAXIL.  PAST MEDICAL HISTORY: 1. Atrial fibrillation. 2. CAD, status post MI. 3. CVA. 4. Depression. 5. Dysphagia. 6. Diabetic neuropathy. 7. CKD with baseline creatinine 1.5-1.6. 8. Hypertension. 9. Hyperlipidemia. 10.History of incontinence. 11.Hypertrophic cardiomyopathy, status post St. Jude VVI pacemaker in     the left phase. 12.Chronic anemia with baseline hemoglobin around 10-11.  MEDICATIONS: 1. Aspirin 81 mg p.o. every day. 2. Lisinopril 10 mg p.o. every day. 3. Glimepiride 4 mg p.o. every day. 4. Simvastatin 20 mg p.o. every day. 5. Iron 325 mg p.o. every day. 6. Coreg 12.5 mg p.o. b.i.d. 7. Demadex 15 mg p.o. every day. 8. Omeprazole once daily. 9. Coumadin 2 mg once daily. 10.Lofibra 134 mg p.o. every day for high triglycerides. 11.MiraLax 1 cap p.o. daily. 12.Diltiazem XL 180 mg p.o. every day.  SURGERIES: 1. Bilateral foot surgery in 2003, bladder tack 2003, hysterectomy and     BSO in 2003. 2. Melanoma removal in 2003. 3. Pacemaker placements x2 in 2003.  FAMILY HISTORY:  Positive for MI, colon cancer, CVA and diabetes.  SOCIAL HISTORY:  The patient lives with her  husband of over 55 years. Nonsmoker, nondrinker.  Denies illicit drug use per the patient.  REVIEW OF SYSTEMS:  Negative except as noted HPI.  PHYSICAL EXAMINATION:  GENERAL:  The patient is in bed, in minimal amount of distress secondary to increased work of breathing. HEENT:  Normocephalic, atraumatic.  Extraocular movements intact. Pupils are equally round and reactive to light. CARDIOVASCULAR:  Irregular rate.  No murmurs auscultated. LUNGS:  Good overall air movement.  No coarse breath sounds or crackles. Diffuse expiratory wheeze noted on exam. ABDOMEN:  Obese, nontender.  Positive bowel sounds.  No significant tenderness. EXTREMITIES:  Pitting edema 2-3+ bilaterally. NEURO:  Alert, oriented x3, grossly normal on neuro exam.  LABS STUDIES:  Including  EKG, CBC, BMET, UA as well as chest x-ray are all pending as the patient has just been admitted to floor.  ASSESSMENT:  This is a 77 year old female with past medical history of congestive heart failure, presenting with increased work of breathing with concerning for congestive heart failure exacerbation, possible pneumonia/urinary tract infection.  PLAN: 1. Cardiopulmonary:  The patient does present with likely CHF     exacerbation.  The patient is known to have approximately 10-pound     weight gain since last time.  We will obtain BMP, check strict I's,     O's and daily weights.  We will place the patient on IV Lasix     diuresis.  We will also obtain EKG.  We will consider transitionto torsemide over the next 24 hours.  We will cycle cardiac     enzymes.  We will obtain chest x-ray to rule out pneumonia.  We     will also continue Coumadin and is having history of AFib.  We will     place the patient on telemetry bed.  We will hold a ACEis/ARBs as well as     NSAIDs while diuresing.  We will also continue on Coreg and     diltiazem and we will titrate up if needed for elevated blood     pressures. 2. Infectious Disease.  We will check UA as well as chest x-ray to     rule out pneumonia and urinary tract infection.  We will also     obtain CBC.  The patient is currently afebrile.  We will hold on     starting antibiotics.  Pending labs and imaging. 3. Hematology:  The patient is noted baseline hemoglobin of 10-11.     Currently on iron.  Check CBC to correlates as well as evaluate for     any signs of leukocytosis. 4. Endocrine:  We will continue the patient on glimepiride and the     patient on SSI.  We will also obtain A1C. 5. Prophylaxis.  The patient currently already anticoagulated.  We     will check an INR.  We will continue the patient on Plavix. 6. This will be pending for evaluation.     Doree Albee, MD   ______________________________ Pearlean Brownie,  M.D.    SN/MEDQ  D:  02/23/2010  T:  02/23/2010  Job:  [id number]  Electronically Signed by Doree Albee  on 03/17/2010 81:19:14 PM Electronically Signed by Pearlean Brownie M.D. on 03/29/2010 04:55:50 PM

## 2010-03-30 ENCOUNTER — Other Ambulatory Visit: Payer: Self-pay

## 2010-04-12 ENCOUNTER — Ambulatory Visit (INDEPENDENT_AMBULATORY_CARE_PROVIDER_SITE_OTHER): Payer: Medicare Other | Admitting: Family Medicine

## 2010-04-12 VITALS — BP 147/74 | HR 71 | Temp 98.4°F | Wt 163.6 lb

## 2010-04-12 DIAGNOSIS — Z515 Encounter for palliative care: Secondary | ICD-10-CM

## 2010-04-12 DIAGNOSIS — I4891 Unspecified atrial fibrillation: Secondary | ICD-10-CM

## 2010-04-12 DIAGNOSIS — I5022 Chronic systolic (congestive) heart failure: Secondary | ICD-10-CM

## 2010-04-12 LAB — COMPREHENSIVE METABOLIC PANEL
AST: 14 U/L (ref 0–37)
BUN: 37 mg/dL — ABNORMAL HIGH (ref 6–23)
Calcium: 9.4 mg/dL (ref 8.4–10.5)
Chloride: 100 mEq/L (ref 96–112)
Creat: 1.8 mg/dL — ABNORMAL HIGH (ref 0.40–1.20)
Total Bilirubin: 0.7 mg/dL (ref 0.3–1.2)

## 2010-04-12 LAB — CONVERTED CEMR LAB
ALT: 15 units/L (ref 0–35)
AST: 14 units/L (ref 0–37)
Albumin: 4.2 g/dL (ref 3.5–5.2)
Calcium: 9.4 mg/dL (ref 8.4–10.5)
Creatinine, Ser: 1.8 mg/dL — ABNORMAL HIGH (ref 0.40–1.20)
Glucose, Bld: 111 mg/dL — ABNORMAL HIGH (ref 70–99)
Hemoglobin: 9 g/dL — ABNORMAL LOW (ref 12.0–15.0)
MCHC: 29.4 g/dL — ABNORMAL LOW (ref 30.0–36.0)
MCV: 91.9 fL (ref 78.0–100.0)
Pro B Natriuretic peptide (BNP): 520.8 pg/mL — ABNORMAL HIGH (ref 0.0–100.0)
RBC: 3.33 M/uL — ABNORMAL LOW (ref 3.87–5.11)
RDW: 15.8 % — ABNORMAL HIGH (ref 11.5–15.5)
Sodium: 140 meq/L (ref 135–145)
Total Protein: 7.7 g/dL (ref 6.0–8.3)

## 2010-04-12 LAB — CBC
HCT: 30.6 % — ABNORMAL LOW (ref 36.0–46.0)
Hemoglobin: 9 g/dL — ABNORMAL LOW (ref 12.0–15.0)
MCHC: 29.4 g/dL — ABNORMAL LOW (ref 30.0–36.0)
MCV: 91.9 fL (ref 78.0–100.0)

## 2010-04-12 MED ORDER — WARFARIN SODIUM 2 MG PO TABS: 2.0000 mg | ORAL_TABLET | Freq: Every day | ORAL | Status: DC

## 2010-04-12 NOTE — Patient Instructions (Signed)
Weigh every morning. When your weight is 145-155 take one 100 mg toresemide tab If your weight is below 145, do not take any toresemide If your weight is above 155, take two toresemide tabs

## 2010-04-12 NOTE — Assessment & Plan Note (Signed)
Wt seems up and may have some central edema.  See patient instructions for CHF action plan

## 2010-04-12 NOTE — Progress Notes (Signed)
  Subjective:    Patient ID: [name], female    DOB: Jan 24, 1931, 77 y.o.   MRN: [id number]  HPI  Still O2 dependant.   Has dyspnea on exertion.  No complaints of leg swelling.  Does have some abd swelling. Weighs every morning and her dry weight seems to be 150 lbs Depressed - feels isolated.  Not supported by husband and not visited by children.  Wants to remain in her home.  No plans to go to a nursing facility.  Frankly says she is ready to die.  Has living will.  DNR and DNI.  No plans for suicide and does continue to take medications.      Review of Systems     Objective:   Physical Exam Note wt up   Lungs bibasilar crackles.   Cardiac nl rate, irregular/irregular rhythm No peripheral edema    Assessment & Plan:

## 2010-04-12 NOTE — Assessment & Plan Note (Signed)
Depressed today - but that is chronic.  She states that she does not want any aggressive care.  No plans to hasten death.

## 2010-04-12 NOTE — Assessment & Plan Note (Signed)
Check labs to avoid over diuresis.  Does not want dialysis.

## 2010-04-13 ENCOUNTER — Telehealth: Payer: Self-pay | Admitting: Family Medicine

## 2010-04-13 ENCOUNTER — Other Ambulatory Visit: Payer: Self-pay | Admitting: Family Medicine

## 2010-04-13 LAB — BRAIN NATRIURETIC PEPTIDE: Brain Natriuretic Peptide: 520.8 pg/mL — ABNORMAL HIGH (ref 0.0–100.0)

## 2010-04-13 NOTE — Telephone Encounter (Signed)
Called and informed that blood work is OK and that the plan is unchanged from yesterday's visit.

## 2010-04-13 NOTE — Telephone Encounter (Signed)
Refill request

## 2010-04-25 ENCOUNTER — Encounter: Payer: Self-pay | Admitting: Home Health Services

## 2010-04-27 ENCOUNTER — Other Ambulatory Visit: Payer: Self-pay | Admitting: Family Medicine

## 2010-04-27 LAB — BASIC METABOLIC PANEL
BUN: 35 mg/dL — ABNORMAL HIGH (ref 6–23)
BUN: 51 mg/dL — ABNORMAL HIGH (ref 6–23)
CO2: 26 mEq/L (ref 19–32)
CO2: 28 mEq/L (ref 19–32)
CO2: 31 mEq/L (ref 19–32)
Calcium: 8.8 mg/dL (ref 8.4–10.5)
Calcium: 8.9 mg/dL (ref 8.4–10.5)
Calcium: 9 mg/dL (ref 8.4–10.5)
Chloride: 94 mEq/L — ABNORMAL LOW (ref 96–112)
Creatinine, Ser: 2.12 mg/dL — ABNORMAL HIGH (ref 0.4–1.2)
Creatinine, Ser: 2.25 mg/dL — ABNORMAL HIGH (ref 0.4–1.2)
GFR calc Af Amer: 26 mL/min — ABNORMAL LOW (ref >60)
GFR calc Af Amer: 27 mL/min — ABNORMAL LOW (ref >60)
GFR calc Af Amer: 28 mL/min — ABNORMAL LOW (ref >60)
GFR calc non Af Amer: 21 mL/min — ABNORMAL LOW (ref >60)
GFR calc non Af Amer: 23 mL/min — ABNORMAL LOW (ref >60)
GFR calc non Af Amer: 23 mL/min — ABNORMAL LOW (ref >60)
Glucose, Bld: 121 mg/dL — ABNORMAL HIGH (ref 70–99)
Glucose, Bld: 155 mg/dL — ABNORMAL HIGH (ref 70–99)
Potassium: 3.8 mEq/L (ref 3.5–5.1)
Sodium: 135 mEq/L (ref 135–145)
Sodium: 135 mEq/L (ref 135–145)
Sodium: 140 mEq/L (ref 135–145)

## 2010-04-27 LAB — URINALYSIS, ROUTINE W REFLEX MICROSCOPIC
Glucose, UA: 250 mg/dL — AB
Specific Gravity, Urine: 1.017 (ref 1.005–1.030)
pH: 5.5 (ref 5.0–8.0)

## 2010-04-27 LAB — CBC
HCT: 28 % — ABNORMAL LOW (ref 36.0–46.0)
HCT: 28.6 % — ABNORMAL LOW (ref 36.0–46.0)
HCT: 30.6 % — ABNORMAL LOW (ref 36.0–46.0)
Hemoglobin: 10.4 g/dL — ABNORMAL LOW (ref 12.0–15.0)
Hemoglobin: 8.9 g/dL — ABNORMAL LOW (ref 12.0–15.0)
Hemoglobin: 9 g/dL — ABNORMAL LOW (ref 12.0–15.0)
Hemoglobin: 9.1 g/dL — ABNORMAL LOW (ref 12.0–15.0)
Hemoglobin: 9.5 g/dL — ABNORMAL LOW (ref 12.0–15.0)
Hemoglobin: 9.9 g/dL — ABNORMAL LOW (ref 12.0–15.0)
MCH: 28.8 pg (ref 26.0–34.0)
MCH: 29 pg (ref 26.0–34.0)
MCH: 29.2 pg (ref 26.0–34.0)
MCHC: 31.1 g/dL (ref 30.0–36.0)
MCHC: 32.2 g/dL (ref 30.0–36.0)
MCHC: 32.5 g/dL (ref 30.0–36.0)
MCV: 89 fL (ref 78.0–100.0)
MCV: 89.5 fL (ref 78.0–100.0)
Platelets: 124 10*3/uL — ABNORMAL LOW (ref 150–400)
Platelets: 126 10*3/uL — ABNORMAL LOW (ref 150–400)
Platelets: 138 10*3/uL — ABNORMAL LOW (ref 150–400)
RBC: 3.12 MIL/uL — ABNORMAL LOW (ref 3.87–5.11)
RBC: 3.13 MIL/uL — ABNORMAL LOW (ref 3.87–5.11)
RBC: 3.44 MIL/uL — ABNORMAL LOW (ref 3.87–5.11)
RBC: 3.66 MIL/uL — ABNORMAL LOW (ref 3.87–5.11)
RDW: 15.9 % — ABNORMAL HIGH (ref 11.5–15.5)
RDW: 15.9 % — ABNORMAL HIGH (ref 11.5–15.5)
WBC: 11.4 10*3/uL — ABNORMAL HIGH (ref 4.0–10.5)
WBC: 5.2 10*3/uL (ref 4.0–10.5)
WBC: 6.1 10*3/uL (ref 4.0–10.5)
WBC: 7.6 10*3/uL (ref 4.0–10.5)

## 2010-04-27 LAB — GLUCOSE, CAPILLARY
Glucose-Capillary: 122 mg/dL — ABNORMAL HIGH (ref 70–99)
Glucose-Capillary: 127 mg/dL — ABNORMAL HIGH (ref 70–99)
Glucose-Capillary: 138 mg/dL — ABNORMAL HIGH (ref 70–99)
Glucose-Capillary: 147 mg/dL — ABNORMAL HIGH (ref 70–99)
Glucose-Capillary: 150 mg/dL — ABNORMAL HIGH (ref 70–99)
Glucose-Capillary: 156 mg/dL — ABNORMAL HIGH (ref 70–99)
Glucose-Capillary: 156 mg/dL — ABNORMAL HIGH (ref 70–99)
Glucose-Capillary: 181 mg/dL — ABNORMAL HIGH (ref 70–99)
Glucose-Capillary: 191 mg/dL — ABNORMAL HIGH (ref 70–99)
Glucose-Capillary: 220 mg/dL — ABNORMAL HIGH (ref 70–99)
Glucose-Capillary: 234 mg/dL — ABNORMAL HIGH (ref 70–99)
Glucose-Capillary: 241 mg/dL — ABNORMAL HIGH (ref 70–99)
Glucose-Capillary: 289 mg/dL — ABNORMAL HIGH (ref 70–99)
Glucose-Capillary: 67 mg/dL — ABNORMAL LOW (ref 70–99)

## 2010-04-27 LAB — COMPREHENSIVE METABOLIC PANEL
AST: 66 U/L — ABNORMAL HIGH (ref 0–37)
AST: 93 U/L — ABNORMAL HIGH (ref 0–37)
Albumin: 3.1 g/dL — ABNORMAL LOW (ref 3.5–5.2)
Alkaline Phosphatase: 105 U/L (ref 39–117)
CO2: 19 mEq/L (ref 19–32)
CO2: 24 mEq/L (ref 19–32)
Calcium: 8.9 mg/dL (ref 8.4–10.5)
Chloride: 106 mEq/L (ref 96–112)
Creatinine, Ser: 1.93 mg/dL — ABNORMAL HIGH (ref 0.4–1.2)
GFR calc Af Amer: 29 mL/min — ABNORMAL LOW (ref >60)
GFR calc Af Amer: 30 mL/min — ABNORMAL LOW (ref >60)
GFR calc non Af Amer: 25 mL/min — ABNORMAL LOW (ref >60)
Potassium: 4.2 mEq/L (ref 3.5–5.1)
Total Bilirubin: 2 mg/dL — ABNORMAL HIGH (ref 0.3–1.2)

## 2010-04-27 LAB — BRAIN NATRIURETIC PEPTIDE: Pro B Natriuretic peptide (BNP): 1441 pg/mL — ABNORMAL HIGH (ref 0.0–100.0)

## 2010-04-27 LAB — URINE MICROSCOPIC-ADD ON

## 2010-04-27 LAB — CARDIAC PANEL(CRET KIN+CKTOT+MB+TROPI)
Relative Index: INVALID (ref 0.0–2.5)
Relative Index: INVALID (ref 0.0–2.5)
Relative Index: INVALID (ref 0.0–2.5)
Total CK: 74 U/L (ref 7–177)
Total CK: 92 U/L (ref 7–177)
Troponin I: 0.05 ng/mL (ref 0.00–0.06)

## 2010-04-27 LAB — HEPARIN LEVEL (UNFRACTIONATED): Heparin Unfractionated: <0.1 IU/mL — ABNORMAL LOW (ref 0.30–0.70)

## 2010-04-27 LAB — CULTURE, BLOOD (ROUTINE X 2)
Culture: NO GROWTH
Culture: NO GROWTH

## 2010-04-27 LAB — DIFFERENTIAL
Basophils Relative: 0 % (ref 0–1)
Lymphs Abs: 0.5 10*3/uL — ABNORMAL LOW (ref 0.7–4.0)
Monocytes Absolute: 0.5 10*3/uL (ref 0.1–1.0)
Monocytes Relative: 7 % (ref 3–12)
Neutro Abs: 6 10*3/uL (ref 1.7–7.7)

## 2010-04-27 LAB — URINE CULTURE: Colony Count: >=100000

## 2010-04-27 LAB — HEMOGLOBIN A1C
Hgb A1c MFr Bld: 8.5 % — ABNORMAL HIGH (ref <5.7)
Mean Plasma Glucose: 197 mg/dL — ABNORMAL HIGH (ref <117)

## 2010-04-27 LAB — APTT: aPTT: 36 seconds (ref 24–37)

## 2010-04-27 LAB — PROTIME-INR
INR: 1.27 (ref 0.00–1.49)
INR: 1.61 — ABNORMAL HIGH (ref 0.00–1.49)
Prothrombin Time: 16.1 seconds — ABNORMAL HIGH (ref 11.6–15.2)
Prothrombin Time: 16.3 seconds — ABNORMAL HIGH (ref 11.6–15.2)
Prothrombin Time: 19.3 seconds — ABNORMAL HIGH (ref 11.6–15.2)

## 2010-04-27 LAB — LIPID PANEL
Cholesterol: 105 mg/dL (ref 0–200)
LDL Cholesterol: 36 mg/dL (ref 0–99)
Total CHOL/HDL Ratio: 3.1 RATIO

## 2010-04-27 LAB — CK TOTAL AND CKMB (NOT AT ARMC): CK, MB: 1.9 ng/mL (ref 0.3–4.0)

## 2010-04-27 MED ORDER — GLIMEPIRIDE 4 MG PO TABS: 4.0000 mg | ORAL_TABLET | Freq: Every day | ORAL | Status: DC

## 2010-05-01 ENCOUNTER — Encounter: Payer: Self-pay | Admitting: Internal Medicine

## 2010-05-01 DIAGNOSIS — I495 Sick sinus syndrome: Secondary | ICD-10-CM

## 2010-05-01 LAB — GLUCOSE, CAPILLARY: Glucose-Capillary: 312 mg/dL — ABNORMAL HIGH (ref 70–99)

## 2010-05-03 LAB — GLUCOSE, CAPILLARY: Glucose-Capillary: 149 mg/dL — ABNORMAL HIGH (ref 70–99)

## 2010-05-15 ENCOUNTER — Other Ambulatory Visit: Payer: Self-pay | Admitting: Family Medicine

## 2010-05-15 NOTE — Telephone Encounter (Signed)
Refill request

## 2010-05-15 NOTE — Telephone Encounter (Signed)
refill request

## 2010-05-19 LAB — CBC
HCT: 26.4 % — ABNORMAL LOW (ref 36.0–46.0)
Hemoglobin: 10 g/dL — ABNORMAL LOW (ref 12.0–15.0)
Hemoglobin: 10.1 g/dL — ABNORMAL LOW (ref 12.0–15.0)
Hemoglobin: 9.1 g/dL — ABNORMAL LOW (ref 12.0–15.0)
MCHC: 33.9 g/dL (ref 30.0–36.0)
MCHC: 34.1 g/dL (ref 30.0–36.0)
MCHC: 34.4 g/dL (ref 30.0–36.0)
MCV: 95.7 fL (ref 78.0–100.0)
MCV: 95.9 fL (ref 78.0–100.0)
RBC: 3.1 MIL/uL — ABNORMAL LOW (ref 3.87–5.11)
RDW: 14.1 % (ref 11.5–15.5)
RDW: 14.3 % (ref 11.5–15.5)

## 2010-05-19 LAB — CARDIAC PANEL(CRET KIN+CKTOT+MB+TROPI)
CK, MB: 1.3 ng/mL (ref 0.3–4.0)
Relative Index: 1.1 (ref 0.0–2.5)
Relative Index: INVALID (ref 0.0–2.5)
Total CK: 100 U/L (ref 7–177)
Troponin I: 0.07 ng/mL — ABNORMAL HIGH (ref 0.00–0.06)
Troponin I: 0.36 ng/mL — ABNORMAL HIGH (ref 0.00–0.06)

## 2010-05-19 LAB — BASIC METABOLIC PANEL
CO2: 29 mEq/L (ref 19–32)
CO2: 33 mEq/L — ABNORMAL HIGH (ref 19–32)
Calcium: 8.4 mg/dL (ref 8.4–10.5)
Chloride: 93 mEq/L — ABNORMAL LOW (ref 96–112)
Chloride: 95 mEq/L — ABNORMAL LOW (ref 96–112)
Creatinine, Ser: 2.11 mg/dL — ABNORMAL HIGH (ref 0.4–1.2)
Glucose, Bld: 141 mg/dL — ABNORMAL HIGH (ref 70–99)
Glucose, Bld: 289 mg/dL — ABNORMAL HIGH (ref 70–99)
Potassium: 3.1 mEq/L — ABNORMAL LOW (ref 3.5–5.1)
Sodium: 136 mEq/L (ref 135–145)
Sodium: 137 mEq/L (ref 135–145)

## 2010-05-19 LAB — GLUCOSE, CAPILLARY
Glucose-Capillary: 137 mg/dL — ABNORMAL HIGH (ref 70–99)
Glucose-Capillary: 149 mg/dL — ABNORMAL HIGH (ref 70–99)
Glucose-Capillary: 176 mg/dL — ABNORMAL HIGH (ref 70–99)
Glucose-Capillary: 178 mg/dL — ABNORMAL HIGH (ref 70–99)
Glucose-Capillary: 221 mg/dL — ABNORMAL HIGH (ref 70–99)
Glucose-Capillary: 258 mg/dL — ABNORMAL HIGH (ref 70–99)
Glucose-Capillary: 322 mg/dL — ABNORMAL HIGH (ref 70–99)

## 2010-05-19 LAB — COMPREHENSIVE METABOLIC PANEL
CO2: 27 mEq/L (ref 19–32)
Calcium: 9.3 mg/dL (ref 8.4–10.5)
Creatinine, Ser: 1.93 mg/dL — ABNORMAL HIGH (ref 0.4–1.2)
GFR calc Af Amer: 31 mL/min — ABNORMAL LOW (ref >60)
GFR calc non Af Amer: 25 mL/min — ABNORMAL LOW (ref >60)
Glucose, Bld: 213 mg/dL — ABNORMAL HIGH (ref 70–99)

## 2010-05-19 LAB — DIGOXIN LEVEL: Digoxin Level: 1.9 ng/mL (ref 0.8–2.0)

## 2010-05-19 LAB — LIPID PANEL
Cholesterol: 136 mg/dL (ref 0–200)
Total CHOL/HDL Ratio: 7.2 RATIO

## 2010-05-19 LAB — BRAIN NATRIURETIC PEPTIDE
Pro B Natriuretic peptide (BNP): 531 pg/mL — ABNORMAL HIGH (ref 0.0–100.0)
Pro B Natriuretic peptide (BNP): 787 pg/mL — ABNORMAL HIGH (ref 0.0–100.0)

## 2010-05-19 LAB — DIFFERENTIAL
Lymphocytes Relative: 7 % — ABNORMAL LOW (ref 12–46)
Lymphs Abs: 0.6 10*3/uL — ABNORMAL LOW (ref 0.7–4.0)
Neutrophils Relative %: 85 % — ABNORMAL HIGH (ref 43–77)

## 2010-05-21 LAB — GLUCOSE, CAPILLARY: Glucose-Capillary: 351 mg/dL — ABNORMAL HIGH (ref 70–99)

## 2010-06-14 ENCOUNTER — Other Ambulatory Visit: Payer: Self-pay | Admitting: Family Medicine

## 2010-06-14 NOTE — Telephone Encounter (Signed)
Refill request

## 2010-06-27 NOTE — Assessment & Plan Note (Signed)
[facility]                            CARDIOLOGY OFFICE NOTE   NAME:[name], [name]                        MRN:          [id number]  DATE:06/14/2006                            DOB:          07-22-1930    PRIMARY CARE PHYSICIAN:  [name]   HISTORY OF PRESENT ILLNESS:  [name] is a 77 year old female patient  with history of hypertrophic non-ischemic cardiomyopathy with an EF of  30 to 40%, minimal amount of obstructive coronary disease by cardiac  catheterization January 2007, sick sinus syndrome with chronic atrial  fibrillation, status post pacemaker implantation who presents to the  office today for followup at the request of the [facility].  The patient presented to them yesterday with  complaints of shortness of breath times about 2 weeks.  She notes  shortness of breath most of the time.  She actually notices shortness of  breath at rest.  She notes her breathing is worse with activity.  She  notes some atypical left sided chest discomfort as well as a pain in the  back of her neck.  Her chest discomfort is not brought on by exertion.  It lasts only seconds.  There is no associated nausea, diaphoresis,  syncope, near syncope.  She does note some palpitations from time to  time when she is short of breath.  She is sleeping on one pillow and she  does note some paroxysmal nocturnal dyspnea from time to time.  She says  her weight is up about 8 to 9 pounds over the last 2 to 3 weeks.   CURRENT MEDICATIONS:  1. Coumadin as directed.  2. Aspirin 81 mg daily.  3. Lipitor 20 mg daily.  4. Benazepril 40 mg daily.  5. Coreg 25 mg twice daily. (She actually has a 25 mg tablet and a      12.5 mg tablet of Coreg and she says that she is taking both of      these twice a day.)  6. Metformin 1 g 1/2 tablet b.i.d.  7. Klor-Con 20 mEq daily.  8. Amlodipine 10 mg daily.  9. Digitek 0.125 mg daily.  10.Glimepiride 2 mg  daily.  11.Furosemide 20 mg 2 tablets twice daily for 3 days (This was the      prescribed therapy given to her by the [facility]      yesterday).  Then she will go back to 2 tablets once daily.   ALLERGIES:  No known drug allergies.   SOCIAL HISTORY:  She denies any tobacco abuse.   REVIEW OF SYSTEMS:  As per HPI.  She denies any melena, hematochezia,  hematuria, dysuria.  She has had some diarrhea for the last couple of  days, there has been no hematochezia.  She does note a cough that is  nonproductive without blood.  The rest of the review of systems are  negative.   PHYSICAL EXAMINATION:  She is a well-nourished, well-developed female in  no distress.  Blood pressure is 130/70, pulse 50, weight 156.8 pounds.  HEENT:  Unremarkable.  NECK:  Without JVD.  CARDIAC:  S2, S2.  Irregularly irregular rhythm.  LUNGS:  Decreased breath sounds bilaterally, no wheezing or rales.  ABDOMEN:  Soft, nontender.  EXTREMITIES:  With 1 to 2+ tight edema bilaterally, chronic stasis  changes.  SKIN:  Is warm and dry.  NEUROLOGIC:  She is alert and oriented x3. Cranial nerves II through XII  grossly intact.   Electrocardiogram reveals underlying atrial fibrillation with a  ventricular rate of 50 V-pacing.   DATA BASE:  Laboratory data obtained from [name] office yesterday  revealed a hemoglobin of 8.6, hematocrit 28.7, MCV 88.3, white count  7,700, platelet count 200,000, sodium 144, potassium 3.9, glucose 117,  BUN 21, creatinine 1.27, digoxin level within normal range at 1.5 and  BNP elevated at 1,177.   IMPRESSION:  1. Dyspnea, concerning for acute on chronic systolic congestive heart      failure.  2. Hypertrophic nonischemic cardiomyopathy with an ejection fraction      of 30% to 40%.      a.     Ejection fraction of 25% to 30% by recent cardiac       catheterization.  3. Non-obstructive coronary disease by cardiac catheterization June      2007.      a.      Results:  20% left main, left anterior descending proximal       in mid 20% to 30%.  First diagonal branch 30%, circumflex okay.       Right coronary artery 40% osteal in the posterior descending       artery.  4. Sick sinus syndrome.      a.     Status post permanent pacemaker implantation.  5. Chronic atrial fibrillation.      a.     Coumadin therapy.  6. Hypertension.  7. Hyperlipidemia.  8. Diabetes mellitus type 2.  9. History of previous embolic stroke.  10.Normocytic anemia.      a.     Suspect contributing to no. 1   PLAN:  The patient presents to the office today with increasing  shortness of breath.  She seems mildly volume overload on exam. Her BNP  is actually quite elevated at 1,177.  This was obtained yesterday at Dr.  Cyndia [name] office.  She also has significant anemia noted.  I question  whether or not this may be contributing some to her shortness of breath.  I will need to get her back to [name] as soon as possible for  followup on her anemia.  I think leaving her at the increased dose of  Lasix for the next few days is a good idea and we will bring her back in  close followup with [name].  We will make sure she gets a BMET and a  BNP level on Monday.  We will also set her up with a 2-D echocardiogram  to reassess her LV function, as it has been a couple of years since her  last echocardiogram.  I will be in touch with [name] office to  make sure that she gets back in followup with them regarding her anemia.  She knows to go to the emergency room if she has any worsening or  unstable symptoms.   Addendum:  I spoke with [name].  She has been referred to GI for her  anemia.      Lindsey Newcomer, PA-C  Electronically Signed      Arlys John  S. Jens Som, MD, [facility]  Electronically Signed   SW/MedQ  DD: 06/14/2006  DT: 06/14/2006  Job #: [id number]   cc:   William A. Leveda [name], M.D. Luretha Murphy, NP

## 2010-06-27 NOTE — Discharge Summary (Signed)
NAMEHALSTON, FAIRCLOUGH                 ACCOUNT NO.:  [id number]   MEDICAL RECORD NO.:  [id number]          PATIENT TYPE:  INP   LOCATION:  4706                         FACILITY:  [facility]   PHYSICIAN:  Nestor Ramp, MD        DATE OF BIRTH:  06/25/1930   DATE OF ADMISSION:  05/30/2007  DATE OF DISCHARGE:  06/04/2007                               DISCHARGE SUMMARY   PRIMARY CARE Gao Mitnick:  [name] A. Hensel, M.D. at [facility].   CHIEF COMPLAINT:  Hemoglobin of 7.6 and feeling weak.   DISCHARGE DIAGNOSES:  1. Iron-deficiency anemia.  2. Acute on chronic renal insufficiency.  3. Possible pneumonia.  4. Acute exacerbation of congestive heart failure.  5. Diabetes.  6. Osteoarthritis.  7. Hypercholesterolemia.  8. Hypertension.  9. Congestive heart failure.  10.Atrial fibrillation.  11.Bradycardia.   DISCHARGE MEDICATIONS:  1. Norvasc 10 mg p.o. daily.  2. Aspirin 81 mg p.o. daily.  3. Lipitor 20 mg p.o. daily.  4. Digoxin 0.125 mg p.o. daily.  5. Demadex 100 mg p.o. daily.  6. Avelox 400 mg p.o. daily x6 days.  7. Coumadin 40 mg p.o. daily.  8. Benazepril 20 mg p.o. daily.   The following medications were stopped:  Coreg, glimepiride, and  metformin.   Following medication dose was changed:  Benazepril.   HOSPITAL COURSE:  1. Anemia:  The patient is anemic at baseline with a hemoglobin of 10,      however, on 7.6 was significantly low for her.  She was typed and      crossed and transfused 2 units of packed red blood cells.  Her      Coumadin and aspirin was held in order to prevent further bleeding.      The patient was Hemoccult negative.  By the day of discharge, the      patient's hemoglobin remained stable.  Given iron-deficiency anemia      of undetermined etiology, the patient may require more in-depth      outpatient GI workup if this has not already been completed.  2. Acute renal insufficiency:  The patient with a baseline creatinine      of 1.5,  which was elevated to 0.1 on admission is likely secondary      to decreased oral intake as well as her anemia.  Her diuretic      medications were held and her ACE inhibitor was held as well.  Her      creatinine quickly improved.  Her baseline prior to discharge and      her medications were restarted.  3. Acute exacerbation of congestive heart failure:  On admission, the      patient's diuretic medications were shelved.  However, over the      course of her hospital stay, the patient continued to have      difficulty weaning completely off from supplemental oxygen.  This      may have been secondary to an exacerbation of her congestive heart      failure.  This improved with  restart of her diuretics.  The patient      transmitted and taken off of her Coreg secondary to bradycardia and      will need to have this readdressed as an outpatient.  4. Possible pneumonia:  The patient has been recently seen by Dr.      Sung Amabile and diagnosed with pneumonia, started on Avelox.  This was      continued during his hospitalization.  The patient was afebrile      during her hospital stay and did not have elevated white count.      However, she will be discharged to complete remainder of her      antibiotic course which is 6 days.  Of note, the patient had      ambulating O2 saturation prior to discharge of 88-89% without      supplemental oxygen.  However, she declined home O2 and despite      being counseled on how it may improve her symptoms and oxygen      levels.  5. Diabetes:  The patient has recently had decreased oral intake.      During her hospital stay, her CBGs slightly improved.  Therefore,      her glimepiride was added.  Subsequently, the patient had several      episodes of hypoglycemia and this was stopped again.  She even      continued to have some hypoglycemia off medications completely.      Therefore, her medicines are not being restarted at discharge.  We      will recommend  followup with her outpatient Coralyn Roselli for a      determination of need of hyperglycemic medications.  6. Hypertension:  Due to elevated creatinine, the patient's ACE      inhibitor was stopped.  Since her creatinine returned to baseline      prior to discharge and her blood pressures were not extremely      elevated, her ACE inhibitor was restarted at half of her previous      dose.  We recommend further titration as an outpatient.  7. Bradycardia:  On admission, the patient was found to be bradycardic      to the 50s.  Therefore, her beta-blocker was stopped.  Given her      congestive heart failure and coronary artery disease, she would      likely have this restarted as an outpatient.  We will defer to her      primary care Mette Southgate depending upon the patient's heart rate a      followup.   CONDITION ON DISCHARGE:  Improved.   DISCHARGE LABS:  Sodium 141, potassium 3.5, BUN 19, creatinine 1.52,  hemoglobin 10.7, total iron binding capacity 331, INR 17, and percent  saturation of 5.   DISPOSITION:  The patient is discharged to home.  The patient again  declined home O2 despite having ambulating oxygen saturation of 88-89%.  The patient also declined home health physical therapy and occupational  therapy despite the recommendations of the occupational therapist and  physical therapist in the hospital.   FOLLOWUP ISSUES:  1. Further titration of benazepril dose.  2. Potential restart of beta-blocker.  3. Questionable need of oral diabetic medications given hypoglycemia      in the hospital.  4. Question of home O2 requirement with ambulation.   FOLLOWUP:  The patient is to follow up with Dr. [name] at the [facility] on June 06, 2007, at 8:45 a.m.       Lauro Franklin, MD  Electronically Signed      Nestor Ramp, MD  Electronically Signed    TCB/MEDQ  D:  06/06/2007  T:  06/07/2007  Job:  [id number]

## 2010-06-27 NOTE — Op Note (Signed)
[name], [name]                 ACCOUNT NO.:  [id number]   MEDICAL RECORD NO.:  [id number]          PATIENT TYPE:  AMB   LOCATION:  DSC                          FACILITY:  [facility]   PHYSICIAN:  Leonie Man, M.D.   DATE OF BIRTH:  01/01/1931   DATE OF PROCEDURE:  11/29/2006  DATE OF DISCHARGE:  11/29/2006                               OPERATIVE REPORT   PREOPERATIVE DIAGNOSIS:  Atypical nevus instep left foot.   POSTOPERATIVE DIAGNOSIS:  Atypical nevus instep left foot.   PROCEDURE:  Excision of atypical nevus left foot.   SURGEON:  Leonie Man, M.D.   ASSISTANT:  None.   ANESTHESIA:  Local 1% lidocaine.   INDICATIONS:  Ms. Saylor is a 77 year old lady who has had previous  melanomas excised.  She presented with a new nevus of her left foot  instep.  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  The  patient returns now for a wider excision of this area.   PROCEDURE IN DETAIL:  The patient is positioned supinely and the left  foot is prepped and draped to be included in a sterile operative field.  The area around the nevus was infiltrated with 1% lidocaine.  An  elliptical incision is made around the area of the nevus and it is  excised in its entirety.  The anterior border is marked with a Prolene  suture and the specimen was forwarded for pathologic evaluation.  The  incision is then closed with interrupted 5-0 nylon sutures and a sterile  compressive dressing is applied.  The patient removed from the operating  room in stable condition.      Leonie Man, M.D.  Electronically Signed     PB/MEDQ  D:  11/29/2006  T:  12/01/2006  Job:  [id number]

## 2010-06-27 NOTE — Assessment & Plan Note (Signed)
[facility]                            CARDIOLOGY OFFICE NOTE   NAME:[name], [name]                        MRN:          [id number]  DATE:07/10/2006                            DOB:          January 28, 1931    PRIMARY CARE PHYSICIAN:  Dr. Doralee Albino.   CLINICAL COURSE:  Ms. [name] is 77 years old and has hypertrophic,  nonischemic cardiomyopathy with an ejection fraction of 30-40%. She also  has chronic atrial fibrillation and has a backup VVI pacemaker in place.  She has been bothered by signs and symptoms of congestive heart failure  over the last several months and she was seen last year by [name]  on May 2 and her Lasix dose was adjusted. She also had laboratory  studies drawn and a 2-D echo performed. Her BUN/creatinine was 22/1.5  and her BNP was 789. We did not do a hemoglobin but her previous  hemoglobin had been 8.6. Her 2-D echocardiogram showed improvement in  her LV systolic function and her LV systolic function was reported as  normal although we did not quantitate an ejection fraction. Her septal  thickness was 14 mm and her posterior wall thickness was 9 mm. The left  atrial and right atrial chambers were dilated. She had mild to moderate  mitral regurgitation and moderate tricuspid regurgitation.   She said she has been doing better. She thinks with the Lasix that most  of the fluid has come off. She has had no palpitations.   PAST MEDICAL HISTORY:  Significant for diabetes, hypertension and  hyperlipidemia. She also has a chronic anemia and she has a history of a  previous embolic stroke.   CURRENT MEDICATIONS:  Coumadin, aspirin, Lipitor, benazepril, Coreg,  amlodipine, Digitek, Prilosec, metformin, and torsemide.   PHYSICAL EXAMINATION:  VITAL SIGNS:  On examination today, the blood  pressure was 140/59 and the pulse 70 and regular.  NECK:  There was no vein distention. Carotid pulses were full and  without bruits.  CHEST:  Clear without rales or rhonchi.  HEART:  Rhythm was irregular. There was a short systolic murmur at the  left sternal edge.  ABDOMEN:  Soft with normal bowel sounds. There was no  hepatosplenomegaly.  EXTREMITIES:  There was no peripheral edema and pedal pulses were equal.  There were some slight stasis changes in the lower extremities.   IMPRESSION:  1. Congestive heart failure related to systolic and diastolic      dysfunction.  2. Hypertrophic nonischemic cardiomyopathy with an ejection fraction      of 30-40% improved to normal by recent echo but with persistent      abnormal septal thickness.  3. Nonobstructive coronary artery disease by catheterization.  4. Chronic atrial fibrillation status post VVI pacemaker implantation.  5. Hypertension.  6. Hyperlipidemia.  7. Type 2 diabetes.  8. History of previous embolic stroke.  9. Normocytic anemia.   RECOMMENDATIONS:  I think [name] appears to be fairly well  compensated in terms of her congestive heart failure and her left  ventricular function has improved. Her blood  pressure is still somewhat  high and we have increased her Benazepril from 40 to 60 a day. She is to  see Dr. [name] tomorrow and I will ask him to followup on the anemia and  to get a followup BNP for the change in her ACE inhibitor dosage. I will  see her back in followup here in 4 months.     Bruce Elvera Lennox Juanda Chance, MD, [facility]  Electronically Signed    BRB/MedQ  DD: 07/10/2006  DT: 07/10/2006  Job #: [id number]

## 2010-06-27 NOTE — Assessment & Plan Note (Signed)
[facility]                            CARDIOLOGY OFFICE NOTE   NAME:[name], [name]                        MRN:          [id number]  DATE:11/20/2006                            DOB:          [birth year]    PRIMARY CARE PHYSICIAN:  [name]. [name], M.D.   CLINICAL HISTORY:  Ms. [name] is 77 years old and returns for management  of her chronic atrial fibrillation and cardiomyopathy. She has a  hypertrophic non-ischemic cardiomyopathy with a previous ejection  fraction that was depressed, but was normal on last study. She also has  chronic atrial fibrillation and has a Research scientist (physical sciences) VVI pacemaker  in place. She has a history of congestive heart failure related to  diastolic dysfunction.   She says she has been doing quite well recently with no chest pain,  shortness of breath or palpitations.   PAST MEDICAL HISTORY:  1. Diabetes.  2. Hypertension.  3. Hyperlipidemia.  4. Chronic anemia.  5. Also has a history of previous embolic stroke.   CURRENT MEDICATIONS:  1. Coumadin.  2. Aspirin.  3. Lipitor.  4. Coreg.  5. Amlodipine.  6. Digitek.  7. Prilosec.  8. Metformin.  9. Torsemide.  10.Benazepril.   PHYSICAL EXAMINATION:  Blood pressure 144/78, pulse 59 and regular.  There was no venous distention. The carotid pulses were full without  bruits.  CHEST: Clear.  CARDIAC: Rhythm was regular. There is a short systolic murmur at the  left sternal edge.  ABDOMEN: Soft and with normal bowel sounds.  Peripheral pulses were full and there is no peripheral edema.   Her electrocardiogram showed VVI pacing. Interrogation of her pacemaker  shows she has good threshold on the ventricular lead. She has  ventricular pacing about 28% of the time.   IMPRESSION:  1. Chronic atrial fibrillation status post [facility] VVI      pacemaker with good pacer function, not pacer dependent.  2. Hypertrophic cardiomyopathy with ejection fraction  previously of 30-      40%, now normal by last echocardiogram.  3. History of congestive heart failure related to systolic and      diastolic dysfunction, now euvolemic and compensated.  4. Non-obstructive coronary disease by catheterization.  5. Hypertension.  6. Hyperlipidemia.  7. Diabetes.  8. History of previous embolic stroke.  9. History of normocytic anemia.   RECOMMENDATIONS:  I think Ms. [name] is stable from a cardiac  standpoint. Will plan to continue her same medications and will plan to  see her back in followup in a year.     Bruce Elvera Lennox Juanda Chance, MD, [facility]  Electronically Signed    BRB/MedQ  DD: 11/20/2006  DT: 11/20/2006  Job #: [id number]

## 2010-06-27 NOTE — H&P (Signed)
[name], [name]                 ACCOUNT NO.:  [id number]   MEDICAL RECORD NO.:  [id number]          PATIENT TYPE:  INP   LOCATION:  4706                         FACILITY:  [facility]   PHYSICIAN:  [name], [name].DATE OF BIRTH:  03/22/1930   DATE OF ADMISSION:  05/30/2007  DATE OF DISCHARGE:                              HISTORY & PHYSICAL   PRIMARY CARE PHYSICIAN:  Lindsey Noa A. Hensel, [name]   CHIEF COMPLAINT:  Hemoglobin of 7.6 and feeling weak.   HISTORY OF PRESENT ILLNESS:  The patient is a 77 year old Caucasian  female with a history of multiple medical problems including CHF, AFib,  chronic anticoagulation, chronic renal insufficiency, hypertension, and  diabetes who had blood work drawn on May 27, 2007, showing hemoglobin  of 7.6 and creatinine of around 1.9.  She was asked to return to clinic  today and then admitted for transfusion by Dr. [name] due to her low  hemoglobin and her weakness.  Per Dr. [name], she presented to clinic  complaining of severe weakness, inability to walk without help, and she  was seen earlier this week by Dr. Sung Amabile and diagnosed with pneumonia  and started on Avelox.  I believe Dr. Sung Amabile has actually increased her  torsemide dose to 110 mg daily and one of her other physicians put her  on Avelox for pneumonia seen on chest x-ray.  Currently, she reports  decreased p.o. intake for 2 weeks and some mild nosebleeds when she  blows her nose for the past 2 days.  These seem to only be mild.  She  does have a cough and shortness of breath, but they have improved from  when she was seen for pneumonia and since she has been on the  antibiotics.  Her cough has been nonproductive.  She denies fevers.  No  chest pain, no dizziness, no bright red blood per rectum, no melena, no  hematemesis, no vaginal bleeding, and no numbness.  She does feel  clumsy, but denies dizziness.  She has to hold on the things to walk.  Her last fall was 3 months ago.  She  fell on her bottom when she fell.  No other recent falls.  No pain.  Does have some constipation.   MEDICATIONS:  1. Aspirin 81 mg.  2. Benazepril 40 mg daily.  3. Coreg 25 mg b.i.d.  4. Coumadin 4 mg 3 times a week and 2 mg, I believe, rest of the week.  5. Glimepiride 4 mg daily.  6. Digoxin 0.125 mg 1 tablet daily.  7. Lipitor 20 mg daily.  8. Norvasc 10 mg daily.  9. Omeprazole 20 mg daily.  10.Torsemide 1 tablet daily 100 mg.  11.Potassium chloride 2 mEq with 2 mL solution.  She takes 1 teaspoon      by mouth daily.  12.Acetaminophen 500 mg 1 tablet p.o. p.r.n.  13.Demadex 10 mg take 1 tablet by mouth once daily for the next 5      days.  In addition to the 100 mg she was taking.  14.Avelox 400 mg daily.   ALLERGIES:  Codeine and Paxil.   PAST MEDICAL HISTORY:  Encephalomalacia; old CVA on CT scan November  2007; cardiac pacemaker Dr. Juanda Chance, [facility]; macular hole in  her left eye, told that she will lose her site, needs endocarditis  prophylaxis, nonischemic cardiomyopathy; atrial fibrillation, on  anticoagulation; coronary atherosclerosis and old MI; history of CVA;  depression; dysphagia; diabetes with peripheral neuropathy; chronic  renal insufficiency with the baseline of 1.5 to 1.6; hypertension;  hyperlipidemia; incontinence; osteoarthritis; degeneration of the lumbar  sacral area; scoliosis; history of fall; stasis dermatitis; hearing  loss; and anemia.   Her CHF at one point had an EF of 25% to 30% in 2007 on the  catheterization.  In May 2008, her EF was 60% with moderate to severe  MR.  Left atrium and right atrium markedly dilated.  Right ventricular  systolic pressure reduced.  Dilated inferior vena cava, and moderate  tricuspid regurgitation.   PAST SURGICAL HISTORY:  Foot surgery 2003, bladder tack 2003,  hysterectomy and BSO 2003, melanoma removed in 2003, pacemaker placement  x2.   FAMILY HISTORY:  Positive for MI, colon cancer, CVA, and  diabetes.   SOCIAL HISTORY:  The patient is a nonsmoker and nondrinker, does not use  drugs.  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   PHYSICAL EXAMINATION:  VITAL SIGNS:  Heart rate in clinic 49, blood  pressure in clinic was 109/48.  In the hospital; temperature 97.8, heart  rate 50 to 59, respiratory rate 18, and blood pressure 112 to 115/52.  GENERAL:  She is alert, no acute distress, and lying in bed.  HEENT:  Head normocephalic and atraumatic.  Eyes, pupils are equal,  round, and reactive to light.  Conjunctivae pale.  Extraocular muscles  intact.  Nose, no obvious bleeding externally.  Mouth, pharynx pink and  moist.  NECK:  Supple.  No cervical lymphadenopathy.  LUNGS:  Normal respiratory effort.  Crackles in the left lower base.  Decreased breath sounds in the right.  HEART:  Bradycardic.  Regular rate rhythm.  No murmurs, rubs, or  gallops.  ABDOMEN:  Soft and nondistended.  Normoactive bowel sounds.  No  distention.  No masses.  No hepatosplenomegaly.  RECTAL:  Hard small pieces of stool noted involved.  No gross blood.  Hemoccult done and sent to lab.  MUSCULOSKELETAL:  A 5/5 upper and lower extremity strength.  Pulses 2+  radial and 2+ dorsalis pedis.  EXTREMITIES:  A 1-2+ edema bilaterally.  NEUROLOGIC:  Alert and oriented x3.  Cranial nerves II through XII  intact, and strength is normal in all extremities.  PSYCHIATRIC:  Oriented x3.  Normally interactive, good eye contact.  Not  anxious and not depressed appearing.   LABORATORY DATA:  Sodium 140, potassium 3.5, chloride 104, bicarb 23,  glucose 83, BUN 36, creatinine 2.16, total bilirubin 0.5, ALP 83, AST  24, ALT 24, total protein 6.6, albumin 3.0, and calcium 8.3.  INR 2.4.  PT 26.7.  White blood cell count 6.2, hemoglobin 7.2, hematocrit 22.6,  platelets 243, neutrophil 76%, and MCV 81.4.   ASSESSMENT AND PLAN:  The patient is a 77 year old  Caucasian female with  a history of congestive heart failure, atrial fibrillation, chronic  anticoagulation, chronic renal insufficiency, hypertension, and  diabetes; here today with anemia, hemoglobin of 7.6.  1. Anemia.  The patient's baseline anemia, her hemoglobin is around      10,  but now this is much lower, reason unclear source of bleeding.      Nose bleeds per patient were only mild.  CBC was rechecked here.      We will type and cross and then transfuse 2 units of packed red      blood cells.  Stop her Coumadin and her aspirin to prevent further      bleeding.  Her INR is currently not supratherapeutic as expected.      We will check iron studies prior to transfusion to make sure we      determine the etiology, anemia of chronic disease versus blood loss      anemia.  We will recheck an INR in the morning along with PTT.      Hemoccult is currently pending.  We will heme check stools x3,      Protonix 40 mg b.i.d. just in case of a gastrointestinal is origin      of bleed.  2. Acute renal insufficiency.  This is acute on chronic.  Her baseline      creatinine is 1.5, but her creatinine currently is 2.1, this may be      due to dehydration since she is having decreased p.o. intake and      likely blood loss as well.  We will hold all diuretics including      the torsemide, stop potassium and enalapril as well, stopping all      probably because of her kidneys.  A blood transfusion should help      with her hydration as well as with her acute renal insufficiency.      We will watch closely to make sure she does not go into congestive      heart failure.  We will not give Lasix between units unless seems      to be more fluid overloaded.  3. Pneumonia.  It seems to be improved symptomatically.  We will      continue treating with Avelox, but renally dose and we will check a      chest x-ray to see how her lungs look.  4. Congestive heart failure.  History of an ejection fraction  less      than 50%.  Her last imaging study showed a baby EF of 60%.      Currently, we will hold all her congestive heart failure      medications at this point due to her low fluid status and her      elevated creatinine and see how she does.  We will like to restart      most of these once she is better hydrated.  Strict I's and O's and      low sodium diet.  5. Diabetes.  Since the sliding scale insulin, she is not eating much.      If CBGs are high, we will restart a glimepiride.  6. Osteoarthritis.  Tylenol 500 mg p.o. q.4 h. p.r.n. pain.  7. Hypercholesterolemia.  Continue her Lipitor.  8. Hypertension.  Holding all blood pressure medicines due to her      anemia and dehydration.  9. Prophylaxis.  We will start her on Protonix.  We will not give      Lovenox due to her current bleed of unknown etiology.   DISPOSITION:  We will have PT and OT evaluate for weakness.  Her  weakness likely is due to her anemia.      Alanda Amass,  [name].  Electronically Signed      Lindsey Bumpers. [name], [name].  Electronically Signed    JH/MEDQ  D:  05/31/2007  T:  05/31/2007  Job:  [id number]

## 2010-06-27 NOTE — Discharge Summary (Signed)
[name], [name]                 ACCOUNT NO.:  [id number]   MEDICAL RECORD NO.:  [id number]          PATIENT TYPE:  INP   LOCATION:  4706                         FACILITY:  [facility]   PHYSICIAN:  Lauro Franklin, MDDATE OF BIRTH:  [birth year]   DATE OF ADMISSION:  05/30/2007  DATE OF DISCHARGE:  06/04/2007                               DISCHARGE SUMMARY   ATTENDING PHYSICIAN:  Dr. Denny Levy   ADDENDUM   DISCHARGE MEDICATIONS:  1. Norvasc 10 mg p.o. daily.  2. Aspirin 81 p.o. daily.  3. Lipitor 20 mg p.o. daily.  4. Digoxin 0.125 mg p.o. daily.  5. Demadex 100 mg p.o. daily.  6. Avelox 400 mg p.o. daily x6 days.  7. Coumadin 4 mg p.o. daily.  8. Benazepril 20 mg p.o. daily.  9. Omeprazole 20 mg p.o. daily.  10.Tylenol 500 mg p.o. q.i.d.      Lauro Franklin, MD  Electronically Signed     TCB/MEDQ  D:  06/30/2007  T:  07/01/2007  Job:  [id number]

## 2010-06-27 NOTE — Assessment & Plan Note (Signed)
[facility]                            CARDIOLOGY OFFICE NOTE   NAME:Vandekamp, FOLASADE MOOTY                        MRN:          [id number]  DATE:10/13/2007                            DOB:          03-23-1930    PRIMARY CARE PHYSICIAN:  Santiago Bumpers. Hensel, MD   CLINICAL HISTORY:  Ms. Vanvalkenburgh is 77 years old who returned for followup  management of her chronic atrial fibrillation and hypertrophic  cardiomyopathy.  She has a St. Jude VVI pacemaker implanted for chronic  atrial fibrillation.  She also has a hypertrophic cardiomyopathy with  reduced ejection fraction in the past, but at echocardiogram last year,  her LV systolic function was normal and the septal thickness was 14 mm  and a posterior wall thickness was 9 mm.   She says she has been feeling well and has had no chest pain, shortness  of breath, or palpitations.   Her past medical history is significant for:  1. Diabetes.  2. Hypertension.  3. Hyperlipidemia.  4. Chronic anemia.  5. Previous embolic stroke.  6. She was hospitalized in April 2009 with iron deficiency anemia.  I      do not know the results of any GI workup from that.   Her current medications include simvastatin, Kaochlor, iron, benazepril  20 mg daily, hydrochlorothiazide 12.5 mg daily, and acetaminophen.   On examination today, the blood pressure is 132/79 and the pulse is 50  and regular.  There was no venous distention.  The carotid pulses were  full without bruits.  Chest was clear.  Heart rhythm was regular.  There  was a short systolic murmur at the left sternal edge.  The abdomen was  soft with normal bowel sounds.  There was some stasis changes in lower  extremities but only trace edema and the pedal pulses were equal.   IMPRESSION:  1. Status post St. Jude VVI pacemaker implantation.  2. Chronic atrial fibrillation.  3. Hypertrophic cardiomyopathy with normal left ventricular systolic      function.  4. History of  congestive heart failure related to diastolic      dysfunction, now euvolemic.  5. Nonobstructive coronary artery disease by catheterization.  6. Hypertension.  7. Hyperlipidemia.  8. History of previous stroke.  9. Chronic anemia.   RECOMMENDATIONS:  I think Ms. Duffy Rhody is doing well.  We will plan to  interrogate her pacemaker today.  I will plan to see her back in  followup in a year and we will not make any changes in her medications.     Bruce Elvera Lennox Juanda Chance, MD, [facility]  Electronically Signed    BRB/MedQ  DD: 10/13/2007  DT: 10/14/2007  Job #: [id number]

## 2010-06-30 NOTE — H&P (Signed)
NAMEKATHARIN, [name]                 ACCOUNT NO.:  [id number]   MEDICAL RECORD NO.:  [id number]          PATIENT TYPE:  INP   LOCATION:  2006                         FACILITY:  [facility]   PHYSICIAN:  Charlton Haws, M.D.     DATE OF BIRTH:  December 19, 1930   DATE OF ADMISSION:  07/27/2005  DATE OF DISCHARGE:                                HISTORY & PHYSICAL   PRIMARY CARDIOLOGIST:  Dr. Charlies Constable.   PATIENT PROFILE:  A 77 year old white female with a history of nonischemic  cardiomyopathy, sick sinus syndrome and A fib who presents with a 9-month  history of progressive angina.   PROBLEM LIST:  1.  Unstable angina.      1.  Status post cardiac catheterization in 1996 with reportedly normal          coronary arteries.      2.  On July 16, 2002 adenosine Myoview inferior infarct of questionable          minor peri-infarct ischemia,ejection fraction 51%.  2.  Nonischemic cardiomyopathy ejection fraction 40-50%.  3.  Sick sinus syndrome status post permanent pacemaker placement in 1997      with upgrade on April 30, 2005 to St. Jude Victory XL DR DDDR dual-      chamber permanent pacemaker.  4.  Hypertension.  5.  Hyperlipidemia.  6.  Type 2 diabetes mellitus.  7.  Chronic atrial fibrillation on chronic anticoagulation.  8.  History of embolic stroke.  9.  Status post total abdominal hysterectomy/bilateral salpingo-      oophorectomy.  10. History of right ankle fracture.   HISTORY OF PRESENT ILLNESS:  A 77 year old white female with a history of  nonischemic cardiomyopathy, sick sinus syndrome and A fib who presents today  with a 48-month history of progressive now daily 8/10 exertional left chest  pressure associated with shortness of breath, lasting 10-15 minutes and  relieved with rest.  The symptoms are more severe, frequent and occurring  with minimal activity x2 days prompting her to present to the ED today.  She  has never had any resting symptoms.  Cardiac markers are negative x1  and ECG  is paced with underlying A Fib without any acute ST-T changes   ALLERGIES:  CODEINE.   HOME MEDICATIONS:  1.  Metformin 1000 mg b.i.d.  2.  Coumadin as directed.  3.  Benazepril 20 mg daily.  4.  Diltiazem XT 240 mg daily.  5.  Lanoxin 0.125 mg daily.  6.  Lopressor 25 mg b.i.d.  7.  Lasix 40 mg daily.  8.  Lipitor 20 mg daily.  9.  Aspirin 81 mg daily.   FAMILY HISTORY:  Mother died of colon cancer at age 38.  Father died of an  MI at age 27.  She has four brothers and sisters and there is CAD,  hypertension, hyperlipidemia, diabetes and cancer in her siblings.   SOCIAL HISTORY:  [place].  She  is retired from [employer]. She is married with seven grown  children.  She denies any tobacco, alcohol or drugs and she does not  routinely exercise or follow a specific diet.   REVIEW OF SYSTEMS:  Positive for chest pain, shortness of breath, dyspnea on  exertion, early satiety and infrequent PND.  She denies any orthopnea,  dizziness, syncope or edema.  Positive for polyuria as well as diabetes.  All other systems reviewed and negative.   PHYSICAL EXAM:  VITAL SIGNS:  Temperature 97.1, heart rate 69, respirations  20, blood pressure 154/81.  GENERAL:  Pleasant white female in no acute distress.  Awake, alert and  oriented x3.  NECK:  Normal carotid upstrokes, no bruits or JVD.  LUNGS:  Respirations regular and unlabored, clear to auscultation.  CARDIAC:  Regular, S1, S2 with no S3, S4 or murmurs. She has a rocking PMI.  ABDOMEN:  Round, soft, nontender, nondistended. Bowel sounds present x4.  EXTREMITIES:  Warm, dry and pink. No cyanosis, clubbing or edema with  chronic venous stasis changes of bilateral lower extremities. No femoral  bruits are noted. Dorsalis pedis and posterior tibial pulses 1+ and equal  bilaterally.   Chest x-ray is pending.  EKG shows 100% pacing captured with underlying A  Fib and a rate of  69.   LABORATORY DATA:  Hemoglobin 11.4, hematocrit 34.5, WBC 7.3, platelets 293.  Sodium 138, potassium 4.5, chloride 105, CO2 26.3, BUN 30, creatinine 1.5,  glucose 92, D-dimer less than 0.22.  CK-MB 2.9, troponin I is less than 0.5,  PTT 37, PT 25.2, INR 2.3.   ASSESSMENT:  1.  Unstable angina.  The patient presents with a 42-month history of      exertional angina that is resolved with rest which is now worse and more      frequent. Cardiac markers are negative x1 and ECG is paced/A Fib without      any acute changes. Plan to admit, cycle enzymes, and hold Coumadin      (heparin when INR less than 2.0), with plans for cardiac catheterization      on Monday.  Continue aspirin, statin, beta blocker, and ACE inhibitor.      Hold metformin and hydrate pre cath.  2.  Hypertension. Blood pressures elevated, titrate beta blocker.  3.  Hyperlipidemia.  Check lipids and LFTs. Continue statin.  4.  Type 2 diabetes mellitus.  Hold metformin.  Add sliding scale insulin.  5.  Atrial fibrillation.  She is paced.  Continue diltiazem, beta blocker,      digoxin.  Hold Coumadin.      Ok Anis, NP    ______________________________  Charlton Haws, M.D.    CRB/MEDQ  D:  07/27/2005  T:  07/27/2005  Job:  [id number]

## 2010-06-30 NOTE — Discharge Summary (Signed)
NAMEMARRION, [name]                 ACCOUNT NO.:  [id number]   MEDICAL RECORD NO.:  [id number]          PATIENT TYPE:  INP   LOCATION:  2028                         FACILITY:  [facility]   PHYSICIAN:  Olga Millers, M.D. LHCDATE OF BIRTH:  08/10/1930   DATE OF ADMISSION:  04/25/2005  DATE OF DISCHARGE:  04/28/2005                                 DISCHARGE SUMMARY   PROCEDURES:  1.  EEG.  2.  2-D echocardiogram.  3.  CT of the head.  4.  Explantation of Trilogy pulse generator and insertion of a St. Jude      [employer] generator, VVI mode.   TIME OF DISCHARGE:  37 minutes.   HOSPITAL COURSE:  [name] is a 77 year old female with a history of  hypertrophic cardiomyopathy, nonischemic and an EF of 35-40%. She has  chronic atrial fibrillation and had a Trilogy pacemaker in 1997. Her Tell-a-  Trace showed that she was reaching elective replacement indices for  pacemaker. She stated that she had been falling and when she came to the  hospital for this she was noted to have contusions to her back and  shoulders. He states that some of falls are of the bed and that she has had  problems with hallucinations and says that she is moving over to make room  for other people in her bed and that is when she falls out. She does not  actually remember the falls themselves. She was admitted for further  evaluation and treatment.   Dr. Juanda Chance felt that a neurologic consult was indicated and she was seen by  Dr. Nash Shearer. An EEG and head CT were performed. The EEG showed mild slowing  with no seizure activity noted. There were no acute problems. Dr. Nash Shearer  felt like it was cardiac syncope and agreed with generator change. The head  CT showed stable atrophy and old infarcts but no acute disease.   Her cardiac enzymes were negative. She had a generator change on April 27, 2005 and her old Trilogy generator was replaced with of a Victory DDD pacer.  It was programmed VVI mode. The lead parameters  were within normal limits.   A chest x-ray on admission showed an ill-defined density in the left mid-  lung laterally. A 2-view follow-up was recommended. She also needed a chest  x-ray after her generator change.   The repeat chest x-ray showed no significant abnormality and no  pneumothorax. Her INR at discharge was 2.1. She was evaluated by Dr.  Jens Som and considered stable for discharge on April 28, 2005 with  outpatient follow-up arranged.   DISCHARGE INSTRUCTIONS:  Her activity level is to be increased gradually and  her arm movement is to be increased per directions. She is to call our  office for any problems with the incision. She is to follow up with a wound  check in 10-14 days and pick up an event monitor as well. She is to follow  up with Dr. Juanda Chance in a few weeks. She is not to drive for now. She is to  see  Dr. [name] next week as scheduled and get a pro time at that time.   DISCHARGE MEDICATIONS:  1.  Coumadin 4 milligrams tonight, 2 milligrams tomorrow night and then      resume her previous schedule.  2.  Aspirin 81 milligrams a day.  3.  Cardizem 240 milligrams a day.  4.  Lopressor 25 milligrams b.i.d.  5.  Lanoxin 0.125 daily.  6.  Lasix 40 milligrams a day.  7.  Lipitor 20 milligrams a day.  8.  Benazepril 20 milligrams a day.  9.  Glucophage 1000 milligrams b.i.d.  10. Imipramine dose unclear, 1 tablet a.m. and 1-1/2 tablet p.m.      Theodore Demark, P.A. [facility]    ______________________________  Olga Millers, M.D. [facility]    RB/MEDQ  D:  04/28/2005  T:  04/30/2005  Job:  [id number]   cc:   William A. [name], M.D.  Fax: [phone]

## 2010-06-30 NOTE — Consult Note (Signed)
[name], [name]                 ACCOUNT NO.:  [id number]   MEDICAL RECORD NO.:  [id number]          PATIENT TYPE:  INP   LOCATION:  2006                         FACILITY:  [facility]   PHYSICIAN:  Doylene Canning. Ladona Ridgel, M.D.  DATE OF BIRTH:  1930-12-13   DATE OF CONSULTATION:  08/01/2005  DATE OF DISCHARGE:                                   CONSULTATION   [name] is a very pleasant 77 year old woman with a history of  nonischemic cardiomyopathy, sick sinus syndrome, chronic atrial  fibrillation, status post pacemaker insertion initially approximately 12  years ago with pacemaker generator change back in March.  The patient has  noted increasing dyspnea with exertion and left-sided substernal chest pain  for the last several weeks and because her symptoms continued to worsen, she  is admitted to the hospital.  She underwent catheterization demonstrating  non-obstructive coronary disease but severe LV dysfunction with an EF of  25%.  She has been ventricular paced almost exclusively on the monitor with  heart rates in the 70s.  She has chronic underlying atrial fibrillation.  She has been on multiple medications for ventricular rate control including  Cardizem, metoprolol and digoxin.  It appears that she paces most of the  time.  The patient has had severe fatigue during this time and states that  when she gets up from her bed to go into the kitchen, she is winded and  cannot continue and has to sit down.  She has had no frank syncope and she  denies significant palpitations.   PAST MEDICAL HISTORY:  Notable for hypertension, diabetes and dyslipidemia.  She had previously preserved LV function with an echo back in June 2004  demonstrating an EF of 50%.   MEDICATIONS ON ADMISSION:  Included aspirin, Cardizem, Lasix, digoxin,  Zocor, Lotensin, metoprolol and Coumadin.   SOCIAL HISTORY:  The patient is married and lives in [place].  She is a  retired Air[employer] pilot.  She denies  tobacco or ethanol use.   FAMILY HISTORY:  Notable for her mother dying from complications of diabetes  and cancer and father died of an MI at age 27.   REVIEW OF SYSTEMS:  Notable for decrease in weight from 200 pounds to 140  pounds in the last six years. She denies headache, vision or hearing  problems, skin rashes or lesions.  She has chronic shortness of breath,  dyspnea on exertion, and pleuritic chest pain.  She has had no significant  peripheral edema since she began Lasix.  She has rare palpitations.   PHYSICAL EXAMINATION:  GENERAL:  She is a pleasant elderly appearing woman in no acute distress.  VITAL SIGNS:  Blood pressure 146/98, the pulse was 78 and irregular,  respirations were 20, temperature is 100, oxygen saturation 96%.  HEENT:  Normocephalic and atraumatic.  Pupils equal and round.  Oropharynx  is moist.  Sclerae are anicteric.  NECK:  Revealed no jugular distension and no thyromegaly.  Trachea is  midline.  Carotids are of 2+ and symmetric.  CARDIAC EXAM:  Regular rate and rhythm with  normal S1 and S2.  There are no  obvious murmurs present.  LUNGS:  Clear bilaterally to auscultation with no wheezes, rales or rhonchi.  ABDOMINAL EXAM:  Soft, nontender, nondistended.  No organomegaly.  There is  no rebound or guarding.  EXTREMITIES:  Demonstrated no cyanosis, clubbing or edema.  There are no  rashes or lesions.  NEUROLOGIC:  Alert and oriented x3 with cranial nerves intact.  Strength 5/5  and symmetric.   The EKG demonstrates underlying A-fib with ventricular pacing and a QRS  duration of 180 to 200 milliseconds.   IMPRESSION:  1.  Atypical chest pain with no obstructive coronary disease.  2.  Congestive heart failure presently class III despite fairly maximal      medicines.  3.  Nonobstructive coronary disease.  4.  History of sick sinus syndrome status post permanent pacemaker      insertion, now with chronic atrial fibrillation.   DISCUSSION:  The  etiology the patient's LV dysfunction and heart failure is  unclear.  Her QRS duration, however, is quite wide.  Resynchronization of  the heart is going to be very important in this particular patient.  The two  options for this would be to decrease her ventricular pacing rate and allow  her intrinsic conduction to come in.  If she does not have significant  evidence of dyssynchrony based on her EKG, then this alone should be enough  for resynchronization.  Alternatively, if she does not have an escape or her  escape rhythm shows evidence of syncope, then consideration perhaps as an  outpatient for a BiV device upgrade would be warranted.  Of note, because of  her most recent generator change, doing this immediately may not be as  advantageous.           ______________________________  Doylene Canning. Ladona Ridgel, M.D.     GWT/MEDQ  D:  08/01/2005  T:  08/01/2005  Job:  [id number]   cc:   Charlies Constable, M.D. [facility]  [address]  [address]  [place]  [place] [address]   Santiago Bumpers. [name], M.D.  Fax: [phone]

## 2010-06-30 NOTE — Discharge Summary (Signed)
[name], [name]                 ACCOUNT NO.:  [id number]   MEDICAL RECORD NO.:  [id number]          PATIENT TYPE:  INP   LOCATION:  2006                         FACILITY:  [facility]   PHYSICIAN:  Charlies Constable, M.D. [facility] DATE OF BIRTH:  05/09/1930   DATE OF ADMISSION:  07/27/2005  DATE OF DISCHARGE:  08/03/2005                                 DISCHARGE SUMMARY   PRIMARY CARDIOLOGIST:  Dr. Juanda Chance.   PRIMARY CARE PHYSICIAN:  Dr. [name].   PRINCIPAL DIAGNOSIS:  Nonischemic cardiomyopathy.   OTHER DIAGNOSES:  1.  Chest pain.  2.  History of sick sinus syndrome status post permanent pacemaker placement      in 1997 with upgrade on April 30, 2005 to St. Jude Victory XL DR DDDR      dual-chamber permanent pacemaker.  3.  Hypertension.  4.  Hyperlipidemia.  5.  Type 2 diabetes mellitus.  6.  Chronic atrial fibrillation on chronic anticoagulation.  7.  History of embolic stroke.  8.  Status post total abdominal hysterectomy with bilateral salpingo-      oophorectomy.  9.  History of right ankle fracture.  10. Abdominal pain.   ALLERGIES:  CODEINE.   PROCEDURE:  Left heart cardiac catheterization.  CT of the chest, abdomen  and pelvis.   HISTORY OF PRESENT ILLNESS:  A 76 year old white female with prior history  of nonischemic cardiomyopathy, sick sinus syndrome and atrial fibrillation  as outlined above who presented to [facility] ED on July 27, 2005 with a  60-month history progressive daily 8/10 exertional left chest pressure  associated with shortness of breath lasting 10 or 15 minutes and resolving  with rest.  Symptoms were worse over the previous 2 days, and in the ER,  cardiac markers were negative, and ECG was without acute changes.  Decision  was made to admit her for further evaluation.   HOSPITAL COURSE:  Following admission, her INR was 2.3 secondary to chronic  Coumadin anticoagulation, and this was placed on hold.  Arrangements were  made for left heart cardiac  catheterization which took place on July 30, 2005, revealing nonobstructive coronary artery disease with an EF of 25-30%  with global hypokinesis.  Postcatheterization, she continued to complain of  atypical chest pain as well as left groin soreness.  To further evaluate  chest pain, she underwent a CT of the chest which was negative for pulmonary  embolism.  She then began to complain of vague abdominal discomfort,  underwent a CT of the abdomen and pelvis which showed no masses or fluid  collections and otherwise no acute disease.  Because of the low EF with  history of heart failure and nonischemic cardiomyopathy, electrophysiology  was consulted for consideration of ICD placement.  After evaluation by Dr.  Ladona Ridgel, he felt that she may be experiencing pacemaker syndrome and would  benefit from discontinuation of the majority of her AV nodal blocking agents  and switch her beta-blocker to Coreg to allow for more intrinsic heart  activity rather than sustained pacing.  Her pacemaker was reprogrammed to  VVI,  and her digoxin and diltiazem were discontinued, and as stated, her  Lopressor was switched to Coreg.  She has been maintaining her heart rates,  in atrial fibrillation, between 60 and 70 and today has been feeling well.  She has been ambulating without significant limitations or dyspnea on  exertion.  She has been seen by case management as she will be  reanticoagulated with Coumadin and with Lovenox bridge given history of  embolic stroke.  She has been taught how to use Lovenox and has given  herself an injection prior to discharge.  She will be discharged home this  afternoon in satisfactory condition.   DISCHARGE LABORATORY DATA:  Hemoglobin 10.2, hematocrit 29.8, WBC 7.0,  platelets 181, MCV 90.0.  Sodium 142, potassium 3.7, chloride 106, CO2 27,  BUN 15, creatinine 1.6, glucose 182.  PT 14.7, INR 1.1.  Total bilirubin  0.8, alkaline phosphatase 77, AST 22, ALT 23, albumin 3.6,  CK 55, MB 2.4,  troponin I 0.06, total cholesterol 205, triglycerides 360, HDL 38, LDL 95,  calcium 9.3, magnesium 2.2, ferritin 67.  Hemoglobin A1c 7.0.  Serum iron  29.  TIBC 304.  Digoxin 1.4.  TSH 1.132.  D-dimer was negative.   DISPOSITION:  The patient is being discharged home today in good condition.   FOLLOW-UP PLAN AND APPOINTMENTS:  She has a follow-up appointment with Dr.  Juanda Chance on June 27 at 10:30 a.m.  She is asked to follow-up with her primary  care physician, Dr. [name], in the next week.  She has follow-up in the  [facility] on June 25 at 2:15 p.m.   DISCHARGE MEDICATIONS:  1.  Coumadin as previously prescribed.  2.  Lovenox 65 mg subcu daily.  3.  Coreg 12.5 mg b.i.d.  4.  Norvasc 10 mg q.d.  5.  Aspirin 81 mg q.d.  6.  Lasix 40 mg q.d.  7.  Lipitor 20 mg q.h.s.  8.  Benazepril 40 mg q.d.  9.  K-Dur 20 mEq q.d.  10. Glucophage 1000 mg b.i.d.   OUTSTANDING LABORATORY STUDIES:  None.   DURATION OF DISCHARGE ENCOUNTER:  45 minutes including physician time.      Ok Anis, NP    ______________________________  Charlies Constable, M.D. [facility]    CRB/MEDQ  D:  08/03/2005  T:  08/03/2005  Job:  [id number]   cc:   William A. [name], M.D.  Fax: [phone]

## 2010-06-30 NOTE — Consult Note (Signed)
[name], [name]                 ACCOUNT NO.:  [id number]   MEDICAL RECORD NO.:  [id number]          PATIENT TYPE:  INP   LOCATION:  2028                         FACILITY:  [facility]   PHYSICIAN:  Bevelyn Buckles. Champey, M.D.DATE OF BIRTH:  08/27/1930   DATE OF CONSULTATION:  DATE OF DISCHARGE:                                   CONSULTATION   REQUESTING PHYSICIAN:  Charlies Constable, M.D.   REASON FOR CONSULTATION:  Syncope.   HISTORY OF PRESENT ILLNESS:  [name] is a 77 year old Caucasian female  with multiple medical problems who has had multiple episodes over the past  week of being found on the floor after a fall and syncopal event.  The  patient has no warning to the episode, does lose consciousness and falls to  the floor.  She is usually found on the floor by her husband with no memory  of falling.  She denies any tongue-biting, incontinence, shaking, or  soreness after the event.  Afterwards, she is back to normal without any  confusion or headache.  Her pacemaker was interrogated and was found to not  be optimally functioning.  She had a low magnetic rate, indicating elective  replacement was necessary.  She denies any weakness, numbness, visual  changes, speech changes, swallowing problems, chewing problems, dizziness,  or vertigo.  The patient states that occasionally she will feel unsteady on  her feet as well.   PAST MEDICAL HISTORY:  1.  Hypertension.  2.  Hyperlipidemia.  3.  Diabetes.  4.  Strokes.  5.  Hypertrophic cardiomyopathy, status post pacemaker.   CURRENT MEDICATIONS:  Include Coumadin, aspirin, Lipitor, Lasix, metoprolol,  Diltiazem, Benazepril, Lanoxin, Metformin, and imipramine.   ALLERGIES:  The patient has drug allergies to CODEINE.   SOCIAL HISTORY:  The patient currently lives with her husband.  Denies any  alcohol or smoking.   FAMILY HISTORY:  Positive for heart disease and strokes.   REVIEW OF SYSTEMS:  Positive as in HPI and also positive for  depression.  Review of systems is negative, as in HPI, in greater than seven of the organ  systems.   PHYSICAL EXAMINATION:  VITALS:  Temperature is 97.7, blood pressure 117/66,  heart rate 63, respirations 18.  Blood sugar is 121.  O2 sat is 94% on room  air.  HEENT:  Normocephalic.  Patient does have ecchymosis around her eyes  secondary to a fall.  Extraocular muscles are intact.  Pupils are equal,  round and reactive to light.  NECK:  Supple.  LUNGS:  Clear.  HEART:  Irregular.  ABDOMEN:  Soft and nontender.  EXTREMITIES:  Good pulses.  NEUROLOGIC:  Patient is awake, alert and oriented x3.  Language is fluent.  Memory and knowledge are within normal limits.  Cranial nerves II-XII are  grossly intact.  Motor examination reveals 4-4+/5 strength.  Normal tone in  all four extremities.  No drift is noted.  Sensory examination is within  normal limits to light touch.  Reflexes are 1-2+.  Cerebellar function is  within normal limits to finger to nose.  Gait  is slightly wide-based.   STUDIES:  Labs:  WBC is 5.9, hemoglobin 10.7, hematocrit 32.1, platelets  264.  PT is 35.4, INR 3.5, PTT 58.  Sodium is 143, potassium 4.3, chloride  102, CO2 31, BUN 37, creatinine 2, glucose 110.  LFTs are within normal  limits.  Calcium is 9.4.  CK is 36.  MB is 1.2.  Troponin is 0.03.  TSH is  0.867.   The patient did have a CT of the head in 2005 that showed atrophy, small  vessel disease, bilateral middle cerebral artery infarcts.   IMPRESSION:  [name] is a 77 year old Caucasian female with syncopal  events, needing replacement of her pacemaker.  Her events could be secondary  to her pacemaker needing replacement and cardiac syncope.  I would get a CT  of the head, especially with the patient's history of cerebrovascular  accident and her falls for any underlying structure or vascular lesions.  I  would also order an EEG to rule out any seizure activity, especially with  the patient's history  of prior cortical strokes.  We will also check carotid  Dopplers at the time.  Will consider doing transcranial Dopplers if there  are any abnormalities noted.  I agree with obtaining a 2D echo for  evaluation.  I also question medications the patient is on, especially the  imipramine, which can cause fluctuation in blood pressure, some orthostatic  syncope.  The patient states that she has been on this medication for a long  time.  We will order these tests and follow up with the patient while she is  in the hospital.      Bevelyn Buckles. Nash Shearer, M.D.  Electronically Signed     DRC/MEDQ  D:  04/26/2005  T:  04/27/2005  Job:  [id number]

## 2010-06-30 NOTE — Assessment & Plan Note (Signed)
[facility]                              CARDIOLOGY OFFICE NOTE   NAME:[name], [name]                        MRN:          [id number]  DATE:10/05/2005                            DOB:          17-May-1930    PRIMARY CARE PHYSICIAN:  Santiago Bumpers. Leveda [name], M.D.  [facility].   [name] is 77 years old and had a hypertrophic/non-ischemic  cardiomyopathy with an ejection fraction of about 30 to 35%.  Her septal  thickness was about 30 mm.  She also has chronic atrial fibrillation and has  a Trilogy DDD pacemaker implanted in 1997 with a generator change in 2007  with a [employer] Victory VVIR generator.   Dr. Leveda [name] and I have been managing her congestive  heart failure over the  past number of weeks.  She has had shortness of breath and edema.  He  increased her Lasix to three 40-mg tablets a day, two days ago for a week.  Overall she feels that she is doing a little bit better __________ Lasix  when I saw her about four weeks ago and this seemed to help some but she  still had persistent volume overload.  She has had no chest pain.  She gets  short of breath when she does minimal activity.   PAST MEDICAL HISTORY:  1. Hypertension.  2. Hyperlipidemia.  3. Diabetes.  4. Previous embolic strokes.   CURRENT MEDICATIONS:  1. Coumadin.  2. Aspirin.  3. Lipitor.  4. Benazepril.  5. Coreg.  6. Lanoxin.  7. Metformin.  8. Potassium.  9. Amlodipine.  10.Lasix 40 mg 3 tablets daily.   EXAMINATION:  VITAL SIGNS:  Blood pressure is 143/71. The pulse 68 and  regular.  NECK:  There was no venous distention.  Carotid pulses were full without  bruits. The jugular venous pulsation could be seen up to the jaw.  CHEST:  Clear.  CARDIAC:  Cardiac rhythm was irregular.  There was a short systolic murmur  at the left sternal edge.  ABDOMEN:  Soft with normal bowel sounds.  There was 1+ peripheral edema.   IMPRESSION:  1. Class III congestive heart  failure with some persistent volume      overload.  2. Hypertrophic non-ischemic cardiomyopathy with ejection fraction of 30      to 40%.  3. Non-obstructive coronary disease.  4. Sick sinus syndrome with chronic atrial fibrillation.  5. Status post recent generator change with a Victory VVIR pacemaker.  6. Hypertension.  7. Hyperlipidemia.  8. Diabetes.  9. History of previous embolic stroke.   RECOMMENDATIONS:  1. I agree with Dr. Cyndia Skeeters adjustments to increase [name]'s Lasix      dose.  He is going to see her back in about two weeks.  2. I will not make any other change in her medications.  I will see her      back in about two months.  3. We had previously considered the possibility of upgrading her pacer to      a BiV pacer but we decided to  try and allow her intrinsic conduction to      be present more of the time.  We adjusted her beta-blockers when we      interrogated her pacemaker last time.  She was intrinsic about 83% of      the time.  Her rates were still fairly well controlled with rates that      range from 70 to 90 to 100.                               Bruce Elvera Lennox Juanda Chance, MD, [facility]    BRB/MedQ  DD:  10/05/2005  DT:  10/05/2005  Job #:  [id number]   cc:   Lindsey A. [name], MD

## 2010-06-30 NOTE — Discharge Summary (Signed)
NAMESANTITA, [name]                           ACCOUNT NO.:  [id number]   MEDICAL RECORD NO.:  [id number]                   PATIENT TYPE:  INP   LOCATION:  5502                                 FACILITY:  [facility]   PHYSICIAN:  [name], M.D.                 DATE OF BIRTH:  31-Jul-1930   DATE OF ADMISSION:  11/19/2001  DATE OF DISCHARGE:  11/20/2001                                 DISCHARGE SUMMARY   RESIDENT PHYSICIAN:  [name], M.D.   DISCHARGE DIAGNOSES:  1. Syncope.  2. Hypertension.  3. Coronary artery disease.  4. History of cerebral vascular accident.  5. Atrial fibrillation, on Coumadin therapy.  6. Hypercholesterolemia.   DISCHARGE MEDICATIONS:  1. Diltiazem SR 240 mg q.d.  2. Lotensin 10 mg q.d.  3. Metoprolol 50 mg b.i.d.  4. Lanoxin 0.125 mg q.d.  5. Lipitor 10 mg q.h.s.  6. Glucophage XR 500 mg b.i.d.  7. Potassium chloride 20 mEq b.i.d.  8. Zoloft 50 mg q.d.  9. Commadin 4 mg q.d.   DISPOSITION AND FOLLOWUP:  The patient is to follow up with Dr. Leveda [name] at  the [facility] on Monday, October 13, at 2 o'clock.  Also, the  patient received a 2D echocardiogram, and the results were not available on  day of discharge.  This needs to be followed up as an outpatient.   PROCEDURES:  1. 2D echocardiogram, results pending.  2. Consults:  None.   BRIEF HISTORY AND PHYSICAL:  This is a 77 year old white female with history  of coronary artery disease, hypertension, and history of CVA who presented  to [facility] ER with complaint of loss of consciousness.  That afternoon,  she was on the toilet and after micturition, so she tried to stand up and  just remembers falling over onto the floor.  No bowel or bladder  incontinence.  She remembers waking up and calling her husband, who came  into the bathroom, and she says she was trying to talk to him and words came  out but had no meaning.  She did sustain a bruise to her right eye, but no  other  injuries, and she also reported that her blood pressure had been  elevated.  She had had a headache for 3 to 4 days, and could not remember  the last time she took her blood pressure medicine, diltiazem.   PERTINENT LABORATORY DATA:  Glucose 174, INR 1.3, PT 16.1, total CK 96, CK-  MB 2.7, troponin-I 0.03.  EKG showed atrial fibrillation with flipped T  waves in 2-3 AVF, leads V3 through V6.  Compared to an EKG in 1997, that was  not significant for change.  Also, CT of the head showed no acute changes.  Significant for an old middle cerebral artery infarct, left greater than  right, and a left cerebellar old infarct versus a cyst.  HOSPITAL COURSE:  1. Syncope:  This episode was thought to be secondary to vasovagal episode     versus a TIA.  The CT of the head was negative for any acute changes, so     micturition syncope was most likely.  Her EKG remained stable, showing     atrial fibrillation, and her next 2 sets of cardiac enzymes ruled her out     for an acute MI or any other lethal arrhythmia.  The patient had no other     episodes while in the hospital, and she will be seen for followup with     Dr. Leveda [name], and it will be decided at that time if she needs any further     workup for this episode.   1. Atrial fibrillation:  Upon admission, it was found that patient was not     therapeutic on Coumadin, with an INR of 1.3.  Coumadin was dosed per     pharmacy, and on day of discharge patient had an INR of 1.4, still was     not at her goal of 2 to 3.  She is discharged on a home regimen of 4 mg     q.d., and this should be followed up as an outpatient.   1. Diabetes mellitus type 2:  The patient had stable blood sugars throughout     hospitalization, and she was continued on her own medicines of Glucophage     XR 500 mg b.i.d. upon discharge.   1. Hypertension:  The patient's blood pressure was high on admission at     195/101 secondary to running out of her medicine, diltiazem.   All of her     home medicines were restarted, and her blood pressure was still elevated     to 190/86-99 on day of discharge.  She will continue her current regimen     and will follow up on her blood pressure as an outpatient, with possible     titration of medications.   1. Coronary artery disease:  The patient was ruled out by EKG and cardiac     enzymes for acute myocardial infarction.  Medical management was     continued upon discharge with all of her home medications.  An     echocardiogram was done on the day of discharge, and the results are     still pending, and should be followed up as an outpatient.                                               [name], M.D.    AS/MEDQ  D:  11/23/2001  T:  11/25/2001  Job:  [id number]   cc:   [name], M.D.  [address] [place]  [place] [address]  Fax: [phone]

## 2010-06-30 NOTE — Cardiovascular Report (Signed)
NAMEMCKYNZIE, LIWANAG NO.:  [id number]   MEDICAL RECORD NO.:  [id number]          PATIENT TYPE:  INP   LOCATION:  2006                         FACILITY:  [facility]   PHYSICIAN:  Charlton Haws, M.D.     DATE OF BIRTH:  Sep 03, 1930   DATE OF PROCEDURE:  07/30/2005  DATE OF DISCHARGE:                              CARDIAC CATHETERIZATION    Ms. [name] is a 77 year old patient of [name].  She has a history of  sick sinus syndrome with atrial fibrillation on Coumadin.  She has a  pacemaker.  She has a history of apparently nonischemic cardiomyopathy with  an EF in the 35% range; so she was admitted to the hospital with increasing  chest pressure.  Catheterization was done, to rule out coronary disease.   We initially attempted to get up the right femoral artery, but we were  unable to.  There may been a sheath dissection.  We were able to get up the  left femoral artery without difficulty.  At the end of the case, distal  aortography showed no critical disease, with free flow antegrade down the  femoral arteries.  We, therefore, AngioSealed both arteries with good  hemostasis.   FINDINGS:  1.  Left main coronary had a 20% discrete stenosis.  2.  The coronary arteries were tortuous.  Left anterior descending artery      had 20-30% __________ lesions in proximal and midportion.  The first      diagonal branch had 30% __________ lesions.  3.  Circumflex coronary artery was large but nondominant.  There was no      critical disease.  The right coronary artery was large; and there was a      40% ostial stenosis in the PDA without other critical disease.  4.  Aortic pressure was 168/83, LV pressure was 160/14.  5.  RAO ventriculography showed an EF of 25-30%.  There was global      hypokinesis, worse in the anterior apex and mid inferior wall.   IMPRESSION:  Patient with continue to appear to have nonischemic  cardiomyopathy.  I talked to [name] regarding possible  upgrade of her  pacemaker to a BiV-ICD; however, I believe her pacemaker generator was just  changed a few months ago for end of life   We will titrate up her medications including her ACE inhibitors since her  blood pressure is somewhat high.  Her EDP was not that elevated.  She  tolerated the procedure well despite having to go into both groins.   We will hold her Coumadin, at least until tomorrow, when we can make sure  that the groins have not bled.           ______________________________  Charlton Haws, M.D.     PN/MEDQ  D:  07/30/2005  T:  07/31/2005  Job:  [id number]

## 2010-06-30 NOTE — H&P (Signed)
[name], [name]                           ACCOUNT NO.:  [id number]   MEDICAL RECORD NO.:  [id number]                   PATIENT TYPE:  INP   LOCATION:  5502                                 FACILITY:  [facility]   PHYSICIAN:  Santiago Bumpers. Hensel, M.D.             DATE OF BIRTH:  1930-10-14   DATE OF ADMISSION:  11/19/2001  DATE OF DISCHARGE:  11/20/2001                                HISTORY & PHYSICAL   CHIEF COMPLAINT:  Loss of consciousness.   HISTORY OF PRESENT ILLNESS:  The patient is a 77 year-old white female with  a history of coronary artery disease, hypertension and a history of stroke  who presents with complaint of loss of consciousness.  She was on the toilet  at approximately 1:00 in the afternoon on the date of admission.  She had  just eaten lunch and she only remembers trying to get up from the toilet  after micturition and just remembers falling over onto the floor.  The  patient reports she did lose consciousness, but no bowel or bladder  incontinence. She did hit her right head and stated when she woke up she  called her husband who came into the bathroom and she said she was trying to  talk to her husband, but the words coming out of her mouth were not what she  was trying to say.  The patient's son was then called who came over and he  reports when he got to the home she was back to normal, speaking complete  sentences and did not appear changed at all.  The patient reports a headache  times three to four days prior to this episode all over her head.  No  associated nausea and vomiting.  No photophobia or phonophobia.  The patient  reports that she forgot to refill her blood pressure medication which is  Diltiazem when she saw Dr. [name] in clinic and cannot remember the last day  she took it.  She denies any feelings of weakness or difficulty moving her  limbs or difficulty speaking at this time.  She does complain of pain over  her right eye where she sustained a  bruise from the fall.   REVIEW OF SYMPTOMS:  Per history of present illness as well as positive for  shortness of breath that she said she had had for a long time.  Positive for  constipation off and on.  Positive for past stroke.  Positive for glasses  without blurry vision.  Positive for easy bruising but no active bleeding or  petechiae.  Negative for current weight loss or chest pain.  No history of  seizures.  No weakness or numbness.  No cough.   PAST MEDICAL HISTORY:  1. Cerebrovascular accident eight years ago.  2. Coronary artery disease status post cardiac pacemaker placement.  3. History of myocardial infarction; the patient reports she has had several  in the past.  4. Diabetes mellitus type 2.  5. Stasis dermatitis.  6. Hypertension.  7. Hypercholesterolemia.  8. History of atrial fibrillation.  The patient is currently on Coumadin.   ALLERGIES:  CODEINE.   MEDICATIONS:  1. Coumadin 4 mg q.d.  2. Diltiazem SR 240 mg q.d. - the patient ran out and does not know the last     day she took it.  3. Glucophage XR 500 mg b.i.d.  4. Lanoxin 0.125 mg q.d.  5. Lasix 40 mg b.i.d.  6. Lipitor 10 mg q.h.s.  7. Lotensin 10 mg q.d.  8. Metoprolol 50 mg b.i.d.  9. Potassium chloride 20 mEq b.i.d.  10.      Zoloft 50 mg q.d.   SOCIAL HISTORY:  The patient lives in [place] with her husband. She has two  sons. She is a non-smoker and a non-drinker.   PAST SURGICAL HISTORY:  1. Hysterectomy and bilateral salpingo-oophorectomy.  2. Bladder tack.  3. Removal of melanoma.  4. Bilateral foot surgery.   FAMILY HISTORY:  Significant for myocardial infarction, colon cancer,  cerebrovascular accident and diabetes.   PHYSICAL EXAMINATION:  VITAL SIGNS:  Temperature 97.2, respirations 20,  pulse 105, blood pressure 195/101.  GENERAL:  The patient is lying on a stretcher in the hallway.  She appears  in no acute distress.  She is alert and oriented times three.  Her two sons  are  present with her.  HEENT:  Positive for ecchymosis on right eyelid.  Sclerae are non-icteric  and no hemorrhages are present. Pupils equal, round and reactive to light.  Nares are clear.  Gums are pink.  Dentures are present. Oropharynx is  without erythema or exudate.  The neck is supple.  No carotid bruits.  No  thyromegaly.  No lymphadenopathy.  LUNGS:  Clear to auscultation bilaterally.  Good respiratory effort.  CARDIOVASCULAR:  Irregularly irregular without murmurs, rubs or gallops.  PMI is non-displaced.  EXTREMITIES:  No cyanosis, clubbing or edema. Capillary refill is less than  two seconds.  ABDOMEN:  Soft, non-tender and non-distended with normal active bowel  sounds.  No hepatosplenomegaly.  SKIN:  Venous stasis changes on bilateral lower extremities.  NEUROLOGIC:  Cranial nerves II-XII are grossly intact.  Sensation is intact  bilaterally.  Strength is 5/5 bilaterally.  Rapid alternating movements  within normal limits.  RECTAL: Deferred.   LABORATORY DATA:  Sodium 142, potassium 4.2, chloride 102, bicarbonate 29,  BUN 21, creatinine 1.3, glucose 174, calcium 9.7.  Total protein 7.6,  albumin 3.4, AST 23, ALT 18, alkaline phosphatase 83, total bilirubin 0.9.  INR 1.3, PT 16.1.  Total CK 96, CK MB 2.7, Troponin I 0.03.   EKG shows an irregular rhythm with flipped T waves in 2, 3, AVF, V3 through  V6.  Compared to an EKG in 1997 this is significant for change.   CT of the head shows no acute changes, significant for an old middle  cerebral artery infarct left greater than right and a left cerebellar old  infarct versus cyst.   ASSESSMENT AND PLAN:  The patient is a 77 year-old white female with a  history of myocardial infarction, cerebrovascular accident, hypertension and  coronary artery disease who presents with syncope versus transient ischemic  attack. 1. Syncope versus transient ischemic attack. History of loss of     consciousness makes syncope most likely, but  the patient was also with     dysarthria per history and a history  of old cerebrovascular accident.  CT     of the head is negative for acute infarct or hemorrhage, but blood     pressure was elevated for a few days since the patient ran out of     medication.  The patient with a history of atrial fibrillation and not     therapeutic on Coumadin, cannot rule out arrhythmia versus vasovagal     causing a syncopal episode. We will admit and monitor.  2. Electrocardiogram changes when compared to an electrocardiogram from     1997.  We will obtain two more cardiac enzymes and repeat     electrocardiogram to rule out an acute myocardial infarction.  The     patient had her pacemaker checked yesterday with her cardiologist, Dr.     Juanda Chance, and was normal.  3. Atrial fibrillation not therapeutic on Coumadin. We will restart Coumadin     and dose per pharmacy.  4. Hypertension, poorly controlled.  The patient ran out of her Diltiazem.     We will start all home medications and monitor.  5. Diabetes mellitus type 2.  Blood sugar is stable on laboratories.     Continue Glucophage and check CBG's.  6. Coronary artery disease.  We will rule out acute myocardial infarction     and continue medical management.     Billey Gosling, M.D.                       William A. [name], M.D.    AS/MEDQ  D:  11/20/2001  T:  11/23/2001  Job:  [id number]

## 2010-06-30 NOTE — H&P (Signed)
[name], [name] NO.:  [id number]   MEDICAL RECORD NO.:  [id number]            PATIENT TYPE:   LOCATION:                                 FACILITY:   PHYSICIAN:  Charlies Constable, M.D. [facility] [address]      DATE OF BIRTH:   DATE OF ADMISSION:  04/25/2005  DATE OF DISCHARGE:                                HISTORY & PHYSICAL   CLINICAL HISTORY:  Ms. Scinto is 77 years old and has a hypertrophic  cardiomyopathy with septal thickening by echocardiogram and a non-ischemic  cardiomyopathy with a low ejection fraction of 35-40% by echocardiogram in  July of 2006.  She also has a chronic atrial fibrillation and has a Trilogy  DV pacemaker which was implanted in 1997 and is programmed to VVI mode.  She  also has diabetes.   She was called in today when her teletrace showed that her magnet rate had  decreased indicating elective replacement indices for her pacemaker.  When  she came here she gave a history that over the past five days she has had  falling spells at home with contusions to her back and shoulders.  She says  that she has no recollection of the spells, but finds herself on the floor.  She says her husband finds her and she cannot figure out how she got there.  She says her husband has not witnessed any of these spells.  On two  occasions she indicated she did have a bowel movement with the spells.   She has had no associated chest pain and no aching indicative of postictal  seizure activity and she does not appear like she has been confused after  she wakes up.   PAST MEDICAL HISTORY:  1.  Hypertension.  2.  Hyperlipidemia.  3.  Diabetes.  4.  Previous embolic stroke.   CURRENT MEDICATIONS:  1.  Coumadin 4 mg one tablet alternating with a half tablet every other day.  2.  Aspirin 81 mg.  3.  Lipitor 20 mg.  4.  Furosemide 40 mg.  5.  Metoprolol 50 mg one and a half tablets b.i.d.  6.  Diltiazem XR 240 mg daily.  7.  Benazepril 20 mg daily.  8.  Lanoxin 0.125  mg daily.  9.  Metformin 1000 b.i.d.  10. Imipramine one in the morning and one-half at night.   SOCIAL HISTORY:  She lives at home with her husband.  She does not smoke.   FAMILY HISTORY:  Please see old records.   REVIEW OF SYSTEMS:  Positive for muscle aches related to her falls.   PHYSICAL EXAMINATION:  VITAL SIGNS:  Blood pressure 120/70, pulse 64 and  regular.  NECK:  There was no venous distention.  The carotid pulses were full and I  could hear no bruits.  CHEST:  Clear without rales or rhonchi.  CARDIAC:  Rhythm was irregular.  There was a short systolic murmur to the  left sternal edge.  Pacer site was well healed.  ABDOMEN:  Soft without hepatosplenomegaly and there were normal bowel  sounds.  EXTREMITIES:  Peripheral pulses were full.  There was trace edema  bilaterally and there were stasis changes on the left side.  NEUROLOGIC:  No focal neurological signs.   Her pacemaker interrogation showed that her battery volts were 2.31 and she  had reached ERI with a low magnet rate.  She is not pacer dependent.   IMPRESSION:  1.  Syncopal spells of uncertain etiology.  2.  Status post Trilogy DDD pacemaker reprogrammed to VVI now which has      reached ERI.  3.  Sick sinus syndrome with now chronic atrial fibrillation and Coumadin      therapy.  4.  Non-ischemic cardiomyopathy with ejection fraction 35-40% and septal      thickness by echocardiogram of 1.6 cm.  5.  Normal coronary angiography in 1996.  6.  Hypertension.  7.  History of heart failure.  8.  Hyperlipidemia.  9.  Diabetes.  10. History of prior embolic stroke.   RECOMMENDATIONS:  Will plan to admit the patient for evaluation of her  syncope and then subsequent generator change.  I will ask Dr. [name] to see  her to help with management of her diabetes.  Will watch her on the monitor.  Will get a neurology consult.  Will repeat her echocardiogram.  There are  many possibilities for the etiology of her  syncope.  She does have some left  ventricular dysfunction so she could have a serious ventricular arrhythmia  that might be the cause of her syncope.  It could be seizures since she has  had some previous stroke and since she has had some fecal incontinence with  her spells.  I doubt they are related to her pacer dysfunction since she is  not pacer dependent but this is a possibility as well.           ______________________________  Charlies Constable, M.D. [facility]     BB/MEDQ  D:  04/25/2005  T:  04/25/2005  Job:  [id number]   cc:   William A. [name], M.D.  Fax: [phone]

## 2010-06-30 NOTE — Assessment & Plan Note (Signed)
[facility]                         GASTROENTEROLOGY OFFICE NOTE   NAME:Brock, [place]                          MRN:          [id number]  DATE:01/11/2006                            DOB:          1931-01-13    CHIEF COMPLAINT:  Dysphagia.   Ms. Reali has known cardiomegaly causing dysphagia and esophageal  compression. She had done reasonably well since I last saw her at the  end of 2005 or early 2006. Recently she had some fluid overload. Was  diuresed. She has lost about 20 pounds. She had a [employer] hamburger  last week and sometime thereafter that (that was the last solid food she  was able to eat) she developed problems with some chest pain and  regurgitation of food and inability to swallow. She saw Dr. [name]. Was  told to take some Zantac. She has been on liquid since then, and she is  tolerating those without any odynophagia, regurgitation at this point.   MEDICATIONS:  Listed and reviewed in the chart.   PAST MEDICAL HISTORY:  Listed and reviewed in the chart. Hypertension,  dyslipidemia, diabetes mellitus, prior embolic stroke, severe congestive  heart failure, pacemaker, hypertrophic and nonischemic cardiomyopathy  with massive cardiomegaly causing compression of the esophagus. Sick  sinus syndrome.   PHYSICAL EXAMINATION:  VITAL SIGNS:  Weight 134 pounds, pulse 65, blood  pressure 142/70.  LUNGS:  Clear.  NECK:  Neck veins mildly distended.  LUNGS:  She is able to lie flat without dyspnea.  HEART:  S1, S2. There is sort of an enlarged PMI that is laterally  displaced. There is no murmur.  ABDOMEN:  Soft, nontender.  CHEST:  Chest wall is nontender.  GENERAL:  She is awake and alert and oriented x3. There are some scars  and bruises on the face from a recent fall.   ASSESSMENT:  Recurrent dysphagia. I think she had a food impaction that  resolved most likely. She did not have a stricture or a mass on her  previous barium swallow,  and we know she has massive cardiomegaly  causing esophageal compression.   RECOMMENDATIONS/PLAN:  Go ahead and advance diet. If she has persistent  problems she is to come back to see me. I think the weight loss was  probably from diuresis.     Iva Boop, MD,FACG  Electronically Signed    CEG/MedQ  DD: 01/11/2006  DT: 01/12/2006  Job #: [id number]   cc:   William A. [name], M.D.

## 2010-06-30 NOTE — Assessment & Plan Note (Signed)
[facility]                              CARDIOLOGY OFFICE NOTE   NAME:[name], Lindsey STRAKER                        MRN:          [id number]  DATE:09/07/2005                            DOB:          05-04-1930    PRIMARY CARE PHYSICIAN:  Santiago Bumpers. [name], MD, [facility].   CLINICAL HISTORY:  [name] is 77 years old and has a hypertrophic and  nonischemic cardiomyopathy, chronic atrial fibrillation and has a Trilogy  DDD pacemaker implanted in 1997 with a new generator change earlier this  year using a St. Jude Victory VVIR generator.   She was recently hospitalized with increasing shortness of breath and left  sided chest pain.  She underwent catheterization, which showed no evidence  of obstructive coronary disease.  She was paced and had an ejection fraction  of about 30% and we considered her for a Bi-V pacer ICD.  She was seen in  consultation by Dr. Ladona Ridgel and he wanted to try and see if he could let her  intrinsic rate come up above her paced rate so that she would have better  synchrony with the native narrow QRS complex.   She has only done fair since discharge.  She said she had a fair amount of  trouble with fluid build-up.   PAST MEDICAL HISTORY:  Significant for hypertension, hyperlipidemia,  diabetes, previous embolic strokes.   CURRENT MEDICATIONS:  Coumadin, aspirin, Lipitor, benazepril, Coreg, Lanoxin  and Metformin.  She was supposed to be off Lasix but she said she has been  taking about one a day.   EXAMINATION:  VITAL SIGNS:  Blood pressure is 133/70.  Pulse 63 and regular.  NECK:  The venous pulsation visible 3 cm above the clavicle.  Carotid pulses  full.  CHEST:  Clear.  HEART:  Rhythm was irregular.  There is 2/6 systolic murmur left sternal  edge.  ABDOMEN:  Soft without organomegaly.  EXTREMITIES:  There is 3+ peripheral edema.   Electrocardiogram showed atrial fibrillation overriding her pacemaker.  The  QRS was 86 milliseconds.  There were non-specific ST-T changes.   We interrogated her pacemaker to see how much of the time she was at her  intrinsic rhythm and what kind of rate she had with this and she was pacing  83% of the time on the ventricle.  Her rates ranged from 50 to 110.   IMPRESSION:  1.  Hypertrophic and non-ischemic cardiomyopathy with ejection fraction of      30% to 40%.  2.  Non-obstructive coronary disease.  3.  Sick sinus syndrome with now chronic atrial fibrillation.  4.  Status post recent generator change with a Victory VVIR pacemaker.  5.  Congestive heart failure class III.  6.  Hypertension.  7.  Hyperlipidemia.  8.  Diabetes.  9.  History of previous embolic stroke.   RECOMMENDATIONS:  It appears that [name]'s rate control is about like  we want it.  She is in her own intrinsic rhythm most of the time and her  rates are fairly well controlled.  However,  she has volume over load now and  we will try to improve this by increasing her Lasix from 40 once a day to 40  twice a day for 3 days and then 40 in the morning and 20 in the afternoon.  Will get a BNP today and we will repeat a BNP in a week with the higher dose  of Lasix and we will see her back in about 3 weeks.                               Bruce Elvera Lennox Juanda Chance, MD, [facility]    BRB/MedQ  DD:  09/07/2005  DT:  09/07/2005  Job #:  [id number]

## 2010-06-30 NOTE — H&P (Signed)
NAME:  [name], [name]                           ACCOUNT NO.:  [id number]   MEDICAL RECORD NO.:  [id number]                   PATIENT TYPE:  INP   LOCATION:  1823                                 FACILITY:  [facility]   PHYSICIAN:  Willa Rough, M.D.                  DATE OF BIRTH:  06/27/1930   DATE OF ADMISSION:  07/14/2002  DATE OF DISCHARGE:                                HISTORY & PHYSICAL   PRIMARY CARDIOLOGIST:  Charlies Constable, M.D.   PRIMARY CARE PHYSICIAN:  Santiago Bumpers. [name], M.D. of [facility].   CHIEF COMPLAINT:  Chest pain, shortness of breath.   HISTORY OF PRESENT ILLNESS:  A lovely 77 year old married white female with  a history of sick sinus syndrome, status post permanent pacemaker, with  chronic atrial fibrillation, on chronic Coumadin therapy with progressively  worsening chest pain and shortness of breath over the last several weeks.  She does note that she recently ran out of her medication and has not been  taking her Lasix as scheduled. Over the last several weeks she has developed  left chest and left arm discomfort with exertion with associated shortness  of breath. Yesterday her pain and shortness of breath became more constant.  Her pain was still left-sided and did continue to wax and wane throughout  the day. This morning she contacted our office with instructions to come to  the emergency room. In the ER she was still somewhat short of breath with  some mild left chest discomfort. Her chest x-ray does show mild congestion.  Her EKG reveals ventricular pacing. She has noted increased lower extremity  edema recently. She has also noted paroxysmal nocturnal dyspnea and at least  three pillow orthopnea.   PAST MEDICAL HISTORY:  1. Sick sinus syndrome.     a. Status post permanent pacemaker implantation.  2. Cardiac catheterization 1996 with normal coronary anatomy.  3. Chronic atrial fibrillation.     a. Chronic Coumadin therapy.  4. History  of congestive heart failure.  5. Asymmetrical septal hypertrophy with an EF of 45% in the past.  6. Hypertension.  7. Hyperlipidemia, treated.  8. History of embolic cerebrovascular accident.  9. Diabetes mellitus type 2.  10.      History of melanoma.  11.      Status post appendectomy.  12.      Status post total abdominal hysterectomy and bilateral salpingo-     oophorectomy.  13.      Status post right ankle fracture requiring open reduction internal     fixation.   MEDICATIONS:  (The patient is unsure of some of her dosages.)  1. Lipitor 10 mg q.h.s.  2. Metoprolol 50 mg b.i.d.  3. K-Dur 2 tablets twice daily.  4. Lasix 40 mg 2 tablets daily.  5. Glucophage 500 mg twice daily.  6. Lanoxin 0.25 mg daily.  7. Aspirin 81 mg daily.  8. Coumadin 4 mg daily with 2 mg every fourth day.  9. Diltiazem (unknown dosage) daily.   ALLERGIES:  Codeine.   SOCIAL HISTORY:  She denies a history of tobacco abuse or alcohol abuse. She  is married and has been for 49 years. She has seven children. She is a  retired Chiropodist.   FAMILY HISTORY:  Significant for coronary artery disease. Her father died of  an MI at age 75. Her mother died of colon cancer. She has one brother with  cancer. She has one sister with breast cancer.   REVIEW OF SYMPTOMS:  Please see the HPI. She denies any fevers, chills,  sweats, or weight changes. She denies any headaches or sore throats. She  denies any odynophagia or dysphagia. She denies any rash. She has noted  chest pain, shortness of breath, dyspnea on exertion, orthopnea, PND, and  edema as noted above. She has noted cough with clear sputum. No hemoptysis.  She denies syncope. She denies any urinary frequency, urgency, dysuria, or  hematuria. She does have bilateral weakness residual from her CVAs. She  denies any arthralgias. She denies any nausea, vomiting, diarrhea, bright  red blood per rectum, melena, or bowel habit changes. She does  note the  chronic constipation. She denies any skin or hair changes. The rest of the  review of systems is negative.   PHYSICAL EXAMINATION:  VITAL SIGNS:  Temperature 97.1, pulse 71,  respirations 20, blood pressure 190/98. Oxygen saturation is 98 on room air.  GENERAL:  She is a well-developed, well-nourished female in no acute  distress.  HEENT:  Normocephalic, atraumatic. PERRLA. EOMI. Sclerae are clear. She has  dentures for her upper plate. Oropharynx is without erythema or exudate.  NECK:  Supple without lymphadenopathy, thyromegaly, bruit, or JVD.  LYMPHADENOPATHY:  None.  CARDIOVASCULAR:  Irregularly irregular rhythm. Normal S1, S2 without murmur,  rub or gallop.  VASCULAR:  No carotid bruit. Bilateral femoral pulses are 2+ with no bruits.  Distal pulses intact.  LUNGS:  With decreased breath sounds bilaterally. She does have some faint  crackles in the left base.  SKIN:  With positive lower extremity discoloration secondary to venous  insufficiency.  ABDOMEN:  Soft, nontender, normal active bowel sounds. No rebound, no  guarding, no hepatosplenomegaly.  EXTREMITIES:  With trace edema bilaterally right greater than left.  MUSCULOSKELETAL:  No spine or CVA tenderness. She has no presacral edema.  NEUROLOGIC:  Grossly intact.   LABORATORY DATA:  Chest x-ray:  Positive pacemaker, cardiac enlargement,  mild congestion. EKG:  Rate 71, ventricular paced.  Labs:  White count 7200,  hemoglobin 11.5, hematocrit 33, platelet count 192,000, sodium 139,  potassium 3.8, chloride 105, CO2 27, glucose 157, BUN 17, creatinine 0.8,  total bilirubin 0.8, AST 25, ALT 25, alkaline phosphatase 78, total protein  6.9, albumin 3.6. CPK 104, CK-MB pending. Troponin I pending.   ASSESSMENT:  1. Chest pain.  2. Mild congestive heart failure.  3. Sick sinus syndrome status post permanent pacemaker.  4. Chronic atrial fibrillation. 5. Chronic Coumadin therapy.  6. Treated hyperlipidemia.  7.  Hypertension, controlled.  8. Diabetes mellitus type 2.  9. Left ventricular ejection fraction 45%.  10.      History of asymmetrical septal hypertrophy.  11.      History of embolic cerebrovascular accident.  12.      History of melanoma.   PLAN:  The patient was seen by Dr.  Myrtis Ser today as well. He formulated the  following plan.  She will be admitted. Cardiac enzymes will be cycled. She  will be continued on her metoprolol. Her aspirin will be increased to 325 mg  at this point. Her Coumadin will be continued. Nitroglycerin IV will be  started. She will be given Lasix 80 mg IV x1. Her regular dose of Lasix will  be continued. A brain natriuretic peptide level, TSH, and D. dimer will all  be checked. A 2-D echocardiogram will be checked to assess for LV function.  If her enzymes are negative we will probably send her home tomorrow with  plans for potassium follow-up and probably outpatient Cardiolite. If her  enzymes are positive she will need to stay in the hospital to be taken off  of her Coumadin and to plan a cardiac catheterization.     [name], P.A.                        Willa Rough, M.D.    SW/MEDQ  D:  07/14/2002  T:  07/14/2002  Job:  [id number]   cc:   Charlies Constable, M.D.   William A. Hensel, M.D.  [address] [place]  [place] [address]  Fax: [phone]

## 2010-06-30 NOTE — Op Note (Signed)
NAMEFLANNERY, CAVALLERO                 ACCOUNT NO.:  [id number]   MEDICAL RECORD NO.:  [id number]          PATIENT TYPE:  INP   LOCATION:  2028                         FACILITY:  [facility]   PHYSICIAN:  Charlies Constable, M.D. [facility] DATE OF BIRTH:  [birth year]   DATE OF PROCEDURE:  04/27/2005  DATE OF DISCHARGE:                                 OPERATIVE REPORT   CLINICAL HISTORY:  Ms. Dearing is 77 years old, has a nonischemic  cardiomyopathy with some hypertrophic features.  She also has sick sinus  syndrome with now chronic atrial fibrillation and had a previous Trilogy DR  pacemaker implanted, which recently reached ERI.  She also has insulin-  dependent diabetes.  She recently had several syncopal episodes and I saw  her in the office, and her pacemaker was at [facility].  We admitted her for  evaluation of syncopal episodes with plan to replace her pacemaker today.  We held her Coumadin.  Her INR today was 2.1.   PROCEDURES:  1.  Explantation of the old Trilogy DR pacemaker (model number 2360, serial      number [id number], implanted September 27, 1995), for ERI.  2.  Inspection of the old atrial and ventricular leads.  3.  Implantation of a new Victory XLDR DDDR pacemaker (model number [id number],      serial number [id number], beginning of life magnet rate 98.5 and end of      life magnet rate 86.3).   INDICATIONS:  ERI of the old pulse generator.   ANESTHESIA:  1% local Xylocaine.   ESTIMATED BLOOD LOSS:  Less than 10 mL.   COMPLICATIONS:  None.   PROCEDURE:  The procedure was performed in laboratory room #4.  The right  anterior chest was prepped and draped in the usual fashion.  The skin and  subcutaneous tissue were anesthetized with 1% local Xylocaine.  The skin and  subcutaneous tissue were anesthetized with 1% local Xylocaine.  An incision  was made over the pocket and extended down to the pocket.  The pocket was  opened and the generator was removed.  The atrial and ventricular leads were  inspected  and parameters tested as described below.  The old leads were  attached to the new generator.  The pocket was irrigated with sterile  kanamycin solution.  The generator was implanted into the pocket.  The  subcutaneous tissue was closed with running 2-0 Vicryl.  The skin was closed  with running 4-0 Vicryl.   PACING PARAMETERS:  Atrial lead is a St. Jude model number 1242T/52, serial  number [id number], date of implant September 27, 1995.  Flutter waves 0.8-2.1 mV,  resistance 433 Ohms.   Ventricular lead (model number 1236T-58 cm, serial number [id number], date of  implant September 27, 1995).  The R-wave was 10 mV.  Minimum threshold of  capture was 1.2 V and resistance 575 Ohms.   The patient tolerated the procedure well and left the laboratory in  satisfactory condition.  Although the patient is in what we think is chronic  atrial fibrillation, we elected to save the atrial  lead and program the  pacemaker to VVI.   Her neurological workup has thus far been negative.  Neurology has seen her  and she has had a CT which showed old strokes, and they do not think she has  a seizure.  Her sugars have been in good range here, and we have not seen  any arrhythmias.  We will let her go home tomorrow but not let her drive.  We will arrange for an outpatient event monitor and see her back in follow-  up.           ______________________________  Charlies Constable, M.D. [facility]     BB/MEDQ  D:  04/27/2005  T:  04/30/2005  Job:  [id number]   cc:   Cardiopulmonary Lab

## 2010-06-30 NOTE — Discharge Summary (Signed)
NAME:  [name], [name]                           ACCOUNT NO.:  [id number]   MEDICAL RECORD NO.:  [id number]                   PATIENT TYPE:  INP   LOCATION:  2039                                 FACILITY:  [facility]   PHYSICIAN:  Willa Rough, M.D.                  DATE OF BIRTH:  1930-11-27   DATE OF ADMISSION:  07/14/2002  DATE OF DISCHARGE:  07/15/2002                           DISCHARGE SUMMARY - REFERRING   PROCEDURES:  None.   HOSPITAL COURSE:  [name] is a 77 year old female with a history of sick  sinus syndrome, who is status post permanent pacemaker.  She also has  chronic atrial fibrillation and chronic anticoagulation.  She had not been  taking her Lasix as scheduled because she ran out of the medication, and she  developed left chest and left arm discomfort with exertion that was  associated with shortness of breath.  Her pain became worse on the day prior  to admission and waxed and waned.  She came to the emergency room, and a  chest x-ray showed mild congestion with an EKG showing ventricular pacing.  She was admitted to rule out MI and for further evaluation.   Her enzymes were negative for MI, and her BNP was mildly elevated at 239.  A  D-dimer was also checked; it was negative at less than 0.22.  She had an  echocardiogram performed, and it showed an EF of 40-50% with severe  hypokinesis of the inferoposterior wall.  Her estimated peak right  ventricular systolic pressure was within the upper limits of normal, and her  PA pressure was mildly elevated at 34.  There was moderate mitral  regurgitation, and her left atrium was moderately dilated.  There was  moderate fibrocalcific change at the aortic root.  [name], M.D.  evaluated the patient and the cardiogram.  He felt that she had had an out-  of-hospital MI and since her current enzymes were negative and she was pain-  free, an outpatient Cardiolite would be adequate for evaluation.  The  patient remained  pain-free.   [name] was volume overloaded when she came to the hospital, and she  received IV Lasix.  She also received additional potassium for hypokalemia.  Her respiratory status returned to baseline, and her potassium was slightly  low at 3.3, but this was again supplemented.  She still had some lower  extremity edema, but it was felt like this was resolving, and she could be  further treated as an outpatient.  [name] was considered stable for  discharge on July 15, 2002, with outpatient follow-up arranged.   LABORATORY VALUES:  Hemoglobin 11.5, hematocrit 33.0, WBC 7.2, platelets  192.  Sodium 141, potassium 3.3, chloride 106, CO2 28, BUN 17, creatinine  1.0, glucose 125.  INR at discharge 1.8.  Other CMET values within normal  limits.  Serial CK-MB and troponin I  negative for MI.  Calcium 9.2.  TSH  1.804.  Lipid profile pending at the time of dictation.   Chest x-ray:  Cardiac enlargement, mild congestion with pacemaker in  position.   DISCHARGE CONDITION:  Improved.   DISCHARGE DIAGNOSES:  1. Congestive heart failure.  2. Out-of-hospital myocardial infarction.  3. Sick sinus syndrome, status post permanent pacemaker.  4. Chronic atrial fibrillation.  5. Chronic anticoagulation.  6. Hyperlipidemia.  7. Hypertension.  8. Noninsulin-dependent diabetes mellitus.  9. History of congenital heart failure.  10.      History of asymmetric septal hypertrophy with an ejection fraction     of 45% in the past, ejection fraction 40-50% with inferoposterior     hypokinesis by echocardiogram this admission.  11.      Status post appendectomy, total abdominal hysterectomy, and     bilateral salpingo-oophorectomy.  12.      History of right ankle fracture requiring surgery.  13.      History of melanoma.  14.      History of embolic cerebrovascular accident.  15.      History of a rash secondary to CODEINE.  16.      Status post bladder tack.  17.      History of myocardial  infarction.   DISCHARGE INSTRUCTIONS:  1. Her activity level is to include no strenuous activity.  2. She is to stick to a low fat, diabetic diet and not do any caffeine or     decaf products until after the stress test.  3. She is to get her Coumadin checked 11:30 on June 3, and a stress test at     noon.  4. She is to follow up with Charlies Constable, M.D. on July 2, at 4:15 p.m. and     get a CMET at the office visit.  5. She is to follow up with [name] A. Hensel, M.D. as needed.   DISCHARGE MEDICATIONS:  1. Cardizem CV 240 mg daily.  2. Lopressor 50 mg b.i.d.  3. Lipitor 10 mg daily.  4. K-Dur 20 mEq which is two 10 mEq tabs b.i.d.  5. Lasix 80 mg daily which is two 40 mg tabs.  6. Digoxin 0.25 mg daily.  7. Aspirin 81 mg q.d.  8. Glucophage 500 mg b.i.d.  9. Coumadin 4 mg daily with 2 mg every 4th day or as directed.  10.      Nitroglycerin p.r.n.     Lavella Hammock, P.A. [facility]                  Willa Rough, M.D.    RG/MEDQ  D:  07/15/2002  T:  07/15/2002  Job:  [id number]   cc:   William A. Hensel, M.D.  [address] [place]  [place] [address]  Fax: [phone]   Charlies Constable, M.D.

## 2010-06-30 NOTE — Procedures (Signed)
EEG NUMBER:   HISTORY:  This is a 78 year old with syncope.   PROCEDURE:  This is a portable EEG.   TECHNICAL DESCRIPTION:  Throughout this routine EEG there is a posterior  dominant rhythm of 9 Hz activity at 10-20 microvolts. The background  activity is symmetric and mostly comprised of alpha and theta range activity  and occasional delta range activity at 10-30 microvolts. Also frequently  noticed throughout the background are frontally intermittent rhythmic delta  or FIRDA in the background. Photic stimulation nor hyperventilation were  performed throughout this recording. Throughout this record there is no  evidence of electrographic seizures or interictal discharge activity. One  should notice on the EKG tracing her rhythm is paced with occasional 1- to 4-  second pause with no rhythm noticed.   IMPRESSION:  This portable EEG is abnormal secondary to mild background  slowing and frontal intermittent rhythmic delta activity. The diffuse  slowing could be suggestive of a toxic/metabolic or primary neuronal  disorder. The presence of frontal intermittent rhythmic delta activity can  also be seen in deep midline structural lesions or hydrocephalus. Clinical  correlation is advised.      Bevelyn Buckles. Nash Shearer, M.D.  Electronically Signed     ZOX:WRUE  D:  04/26/2005 18:55:41  T:  04/27/2005 22:21:43  Job #:  [id number]

## 2010-06-30 NOTE — Discharge Summary (Signed)
NAME:  [name], [name]                           ACCOUNT NO.:  [id number]   MEDICAL RECORD NO.:  [id number]                   PATIENT TYPE:  INP   LOCATION:  5511                                 FACILITY:  [facility]   PHYSICIAN:  Lawerance Sabal, MD                   DATE OF BIRTH:  06/16/1930   DATE OF ADMISSION:  07/06/2003  DATE OF DISCHARGE:  07/08/2003                                 DISCHARGE SUMMARY   DISCHARGE DIAGNOSES:  1. Acute renal failure probably secondary to dehydration, resolved.  2. Hyperkalemia, resolved.  3. Diabetes mellitus.  4. Hypertension.  5. Atrial fibrillation.  6. Hyperlipidemia.  7. Chronic anticoagulation.   DISCHARGE MEDICATIONS:  1. Aspirin 81 mg p.o. daily.  2. Coumadin 2 mg daily for two days, followed by 4 mg daily for one day;     repeat cycle as instructed.  3. Diltiazem XR 240 mg p.o. daily.  4. Lanoxin 0.125 mg p.o. daily.  5. Metoprolol 50 mg p.o. b.i.d.  6. Zoloft 50 mg p.o. daily.  7. Lipitor 20 mg p.o. at bedtime.  8. Glucophage 850 mg p.o. b.i.d.   DISPOSITION:  The patient is discharged to home.   DISCHARGE INSTRUCTIONS:  1. Diet: Low fat, low salt, complex carbohydrate modified diet.  2. Activity: As tolerated.  3. Do not take Lotensin, spironolactone, or potassium supplements until     further instructions per MD.   Elenor Quinones INSTRUCTIONS:  1. Dr. Juanda Chance on May 27th as scheduled.  2. Dr. [name] on June 6th at 2:00 p.m. at the family practice center.   REVIEW OF HISTORY:  This is a 77 year old white female with multiple medical  problems who presents with vague symptoms of weakness and anorexia for one  week. This was associated with pain in both arms and legs, and a general  feeling of not feeling well. The patient also has hiccups and some shortness  of breath. No fever, cough, chest pain, nausea, vomiting, or urinary  disturbances.   PHYSICAL EXAMINATION:  On admission blood pressure was 137/64, pulse rate  75, respiratory  rate 20, temperature 97.5. O2 saturation of 96% on room air.  GENERAL: A fairly developed, fairly nourished female, alert, coherent,  oriented times three, not in distress.  HEENT: Pupils equal, round, and reactive to light.  Tympanic membranes with  normal landmarks. Dry oral mucosa. No oropharyngeal congestion.  NECK: No JVD.  CVS: Irregularly irregular rhythm, normal rate, no murmurs.  RESPIRATORY: Clear to auscultation bilaterally. No crackles or wheezes.  ABDOMEN: Normal bowel sounds, soft, no masses or tenderness.  EXTREMITIES: Stasis dermatitis changes in both lower legs  NEUROLOGIC: No focal signs.   LABORATORY DATA:  On admission hemoglobin 13.5, hematocrit 39.5, WBC 7.9,  platelet count 192,000.  Sodium 135, potassium 6.8, chloride 107, CO2 pf 19,  BUN 70, creatinine 2.2, glucose 122.  Urine creatinine 1.7, urine sodium 45.  SENA 0.7%.  Point of care markers: CK-MB 1.9, troponin less than 0.05,  myoglobin 149.   EKG showed normal sinus rhythm, left axis, left bundle branch block. CT scan  of the head normal.   HOSPITAL COURSE:  1. Acute renal failure, multifactorial in etiology including dehydration,     ACE inhibitors, and diuretics. SENA was 0.7% suggesting prerenal     etiology. The patient looked clinically dry. The patient was given NSS     500 mL bolus and started IV fluids for hydration with improvement of     serum creatinine and BUN.   1. Hyperkalemia. The patient was given Kayexalate 60 gm. Serial BMET showed     normalizing of the potassium level.   1. Diabetes mellitus. Glucophage was held and the patient was started on     sliding scale insulin with note of fairly controlled CBG.   1. PVS. The patient presented with some shortness of breath.  Point of care     cardiac enzymes were normal and there was no history of chest pain. There     were no failure signs on physical examination. MI was not strongly     considered. However, a 2-D echo was requested to  document the patient's     cardiac function. As of discharge results are still pending. Serum     digoxin levels were noted to be elevated 2.4. The patient's Lanoxin dose     was decreased 0.125 mg p.o. daily. Diltiazem and metoprolol at home dose     were continued with note of good blood pressure and good rate control.   1. Hyperlipidemia. On admission Lipitor was held and the patient responded     prior to discharge.   1. Chronic anticoagulation. The patient was maintained on Coumadin home dose     and scheduled. PT was 19.6 with an INR of 2.1.   1. Depression. The patient was maintained on Zoloft.   CONDITION ON DISCHARGE:  The patient was discharged with no symptoms, with  stable vital signs, and stable O2 saturations on room air. The patient was  ambulating without difficulty. The patient was advised to follow up with Dr.  Juanda Chance and Dr. [name] as scheduled.   DISCHARGE LABORATORY DATA:  Sodium 138, potassium 4.0, chloride 108, CO2 24,  glucose 154, BUN 36, creatinine 1.3, calcium 8.8.   Two-D echocardiogram results pending.                                                Lawerance Sabal, MD    MC/MEDQ  D:  07/08/2003  T:  07/10/2003  Job:  [id number]   cc:   William A. [name], M.D.  Fax: [phone]   Charlies Constable, M.D. [facility]

## 2010-07-19 ENCOUNTER — Other Ambulatory Visit: Payer: Self-pay | Admitting: Family Medicine

## 2010-07-19 MED ORDER — DILTIAZEM HCL ER BEADS 180 MG PO CP24: 180.0000 mg | ORAL_CAPSULE | Freq: Every day | ORAL | Status: DC

## 2010-07-19 NOTE — Telephone Encounter (Signed)
Refilled after fax request 

## 2010-07-31 ENCOUNTER — Encounter: Payer: Self-pay | Admitting: Internal Medicine

## 2010-07-31 DIAGNOSIS — I495 Sick sinus syndrome: Secondary | ICD-10-CM

## 2010-08-08 ENCOUNTER — Telehealth: Payer: Self-pay | Admitting: Family Medicine

## 2010-08-08 NOTE — Telephone Encounter (Signed)
Pt asking for home advice, says she is filled up with fluid & doesn't have a way here, wants to know what she can do at home?

## 2010-08-08 NOTE — Telephone Encounter (Signed)
Patient states that her abdomen is swelling.  Weight is currently 165 pounds.  She weighed 140 on her scales 2 months ago.  Her last weight at the clinic on 2/12 was 163.6 pounds.  She's c/o SOB, on O2 at 3 L via [place].  Says that it is difficult for her to lay down.  No LE edmea and reports that she has been taking her toresemide 100 mg daily.  Did admit that she is drinking about 3 diet cokes a day.  Advised her that soda does contain sodium.  She is unable to come into the clinic, has no one that can bring her.  Told her I would discuss with Dr. [name] and call her back.

## 2010-08-08 NOTE — Telephone Encounter (Signed)
Patient's hx is as below.  Documented 20+ lb wt increase, abd swelling and DOE.  Not short of breath at rest.  Strongly advised to be seen.  States no transportation.  Advised to see me asap and to double her dose of demedex to 200 mg per day for three days.

## 2010-08-24 ENCOUNTER — Encounter: Payer: Self-pay | Admitting: Cardiology

## 2010-08-29 ENCOUNTER — Ambulatory Visit (INDEPENDENT_AMBULATORY_CARE_PROVIDER_SITE_OTHER): Payer: Medicare Other | Admitting: Cardiology

## 2010-08-29 ENCOUNTER — Encounter: Payer: Self-pay | Admitting: Cardiology

## 2010-08-29 DIAGNOSIS — R0602 Shortness of breath: Secondary | ICD-10-CM

## 2010-08-29 DIAGNOSIS — I4891 Unspecified atrial fibrillation: Secondary | ICD-10-CM

## 2010-08-29 DIAGNOSIS — E7800 Pure hypercholesterolemia, unspecified: Secondary | ICD-10-CM

## 2010-08-29 DIAGNOSIS — I6789 Other cerebrovascular disease: Secondary | ICD-10-CM

## 2010-08-29 DIAGNOSIS — I5032 Chronic diastolic (congestive) heart failure: Secondary | ICD-10-CM

## 2010-08-29 DIAGNOSIS — I509 Heart failure, unspecified: Secondary | ICD-10-CM

## 2010-08-29 DIAGNOSIS — I10 Essential (primary) hypertension: Secondary | ICD-10-CM

## 2010-08-29 MED ORDER — METOLAZONE 2.5 MG PO TABS: ORAL_TABLET | ORAL | Status: DC

## 2010-08-29 MED ORDER — POTASSIUM CHLORIDE CRYS ER 20 MEQ PO TBCR: EXTENDED_RELEASE_TABLET | ORAL | Status: DC

## 2010-08-29 MED ORDER — CARVEDILOL 12.5 MG PO TABS: ORAL_TABLET | ORAL | Status: DC

## 2010-08-29 NOTE — Patient Instructions (Signed)
Take Metolazone 2.5mg  every other day 30 minutes before you take Torsemide(demadex).   Take two KCL (potassium) on the days that you take metolazone--this will be every other day.  Stop Lopressor(metoprolol).  Increase Coreg(carvedilol) to 18.75mg  twice a day. This will be one and one-half 12.5mg  tablets twice a day.  Lab today--BMP/BNP 428.32  427.31  Schedule an appt to see Dr Shirlee Latch in about 10 days. Schedule an appt for lab in 10 days when you see Dr McLean--BMP/BNP 428.32  427.31

## 2010-08-30 DIAGNOSIS — I4891 Unspecified atrial fibrillation: Secondary | ICD-10-CM | POA: Insufficient documentation

## 2010-08-30 DIAGNOSIS — I5032 Chronic diastolic (congestive) heart failure: Secondary | ICD-10-CM | POA: Insufficient documentation

## 2010-08-30 LAB — BASIC METABOLIC PANEL
BUN: 35 mg/dL — ABNORMAL HIGH (ref 6–23)
Calcium: 8.9 mg/dL (ref 8.4–10.5)
Chloride: 105 mEq/L (ref 96–112)
Creatinine, Ser: 1.9 mg/dL — ABNORMAL HIGH (ref 0.4–1.2)

## 2010-08-30 LAB — BRAIN NATRIURETIC PEPTIDE: Pro B Natriuretic peptide (BNP): 526 pg/mL — ABNORMAL HIGH (ref 0.0–100.0)

## 2010-08-30 NOTE — Assessment & Plan Note (Addendum)
H/o CVA.  Should be bridged if she has to come off coumadin given atrial fibrillation with history of CVA.   I spent > 40 minutes talking to patient and reviewing the chart.

## 2010-08-30 NOTE — Progress Notes (Signed)
PCP: Dr. [name]  77 yo with history of diastolic CHF in the setting of hypertrophic cardiomyopathy, chronic atrial fibrillation, and CKD presents for cardiology evaluation.  Patient has been seen by Dr. Juanda Chance in the past and is seen by me for the first time today.  She has noted a significant increase in the last few month in her chronic exertional dyspnea.  She has gained 26 lbs since her last appointment in this office.  She is not urinating as much despite taking torsemide 200 mg qam.  For the last 3 months, she has been short of breath walking around in her house and with minimal housework.  It is very hard for her to cook.  She has severe orthopnea and has to sleep sitting up.  She walks with a walker now.  No chest pain.  She has been using home oxygen for about 3 months.    ECG: atrial fibrillation at 66, anterolateral T wave inversions.   Labs (2/12): BNP 521, K 4, creatinine 1.8  PMH: 1. St. ude PCM  2. Chronic atrial fibrillation: on warfarin. 3. H/o CVA 4. Depression 5. CKD 6. DM 7. HTN 8. Hyperlipidemia 9. Hypertrophic cardiomyopathy with diastolic CHF: Echo (9/10) with EDF 55-60%, severe asymptomatic septal hypertrophy, mild MR, PA systolic pressure 53 mmHg.   10.  Nonobstructive CAD: 6/07 cath with luminal irregularities.   SH: Married, has 7 children.  Never smoked.    FH: CAD, CVA  ROS: All systems reviewed and negative except as per HPI.   Current Outpatient Prescriptions  Medication Sig Dispense Refill  . aspirin 81 MG chewable tablet Chew 81 mg by mouth daily.        . benazepril (LOTENSIN) 20 MG tablet TAKE ONE TABLET BY MOUTH EVERY DAY  90 tablet  3  . diltiazem (TIAZAC) 180 MG 24 hr capsule Take 1 capsule (180 mg total) by mouth daily.  30 capsule  12  . docusate sodium (COLACE) 100 MG capsule Take 100 mg by mouth 2 (two) times daily as needed.        . ferrous sulfate 325 (65 FE) MG tablet Take 325 mg by mouth daily.        Marland Kitchen glimepiride (AMARYL) 4 MG tablet  Take 1 tablet (4 mg total) by mouth daily.  30 tablet  12  . NON FORMULARY Oxygen; 2 L, Dx 428.22 86% on RA       . omeprazole (PRILOSEC) 20 MG capsule TAKE ONE CAPSULE BY MOUTH EVERY DAY  30 capsule  12  . polyethylene glycol (MIRALAX / GLYCOLAX) packet Take 17 g by mouth daily.        . potassium chloride SA (KLOR-CON M20) 20 MEQ tablet Take 1 tablet daily. One the days that you take metolazone take 2 tablets daily.  45 tablet  6  . simvastatin (ZOCOR) 20 MG tablet TAKE ONE TABLET BY MOUTH AT BEDTIME  30 tablet  12  . torsemide (DEMADEX) 100 MG tablet Take 2 tablets daily  30 tablet  12  . warfarin (COUMADIN) 2 MG tablet Take 1 tablet (2 mg total) by mouth daily. Dose varies currently 2 tabs three days per week and 1 tabs 4 days per week.  60 tablet  12  . carvedilol (COREG) 12.5 MG tablet Take 1 and 1/2 tablets twice a day.  90 tablet  6  . metolazone (ZAROXOLYN) 2.5 MG tablet Take 1 tablet every other day. Take 30 minutes before you take Torsemide(demadex)  15  tablet  6    BP 146/74  Pulse 66  Resp 18  Ht 5\' 5"  (1.651 m)  Wt 174 lb (78.926 kg)  BMI 28.96 kg/m2 General: NAD Neck: JVP 12 cm, no thyromegaly or thyroid nodule.  Lungs: Clear to auscultation bilaterally with normal respiratory effort. CV: Nondisplaced PMI.  Heart irregular S1/S2, no S3/S4, 1/6 systolic murmur along the sternal border.  2+ edema to knee on left, 1+ edema to knee on right.  No carotid bruit.  Pedal pulses hard to palpate with lower extremity swelling.  Abdomen: Soft, nontender, no hepatosplenomegaly, no distention.  Neurologic: Alert and oriented x 3.  Psych: Depressedaffect. Extremities: No clubbing or cyanosis.

## 2010-08-30 NOTE — Assessment & Plan Note (Addendum)
Chronic diastolic CHF in the setting of hypertrophic cardiomyopathy.  She does not seem to have significant LV outflow obstruction. She is very volume overloaded on exam with NYHA class III-IV symptoms.  24 lb weight gain since last appointment in this office. She is taking a high dose of torsemide (200 mg daily) without much response.  She has known significant CKD which complicates this picture.   - Add metolozone 2.5 mg 30 minutes before torsemide every other day.  Increase KCl to 40 mEq daily on metolozone days.  Will check BMET and BNP today and in 10 days.   - Followup in 10-14 days in the office.  If symptoms do not improve, she will need to be admitted for diuresis.

## 2010-08-30 NOTE — Assessment & Plan Note (Signed)
BP mildly elevated but will not change meds today.

## 2010-08-30 NOTE — Assessment & Plan Note (Signed)
Need to check lipids. 

## 2010-08-30 NOTE — Assessment & Plan Note (Signed)
She does not need to take both Coreg and metoprolol.  I will have her stop metoprolol and increase Coreg to 18.75 mg bid.  Continue coumadin.

## 2010-09-05 ENCOUNTER — Other Ambulatory Visit (INDEPENDENT_AMBULATORY_CARE_PROVIDER_SITE_OTHER): Payer: Medicare Other | Admitting: *Deleted

## 2010-09-05 DIAGNOSIS — I4891 Unspecified atrial fibrillation: Secondary | ICD-10-CM

## 2010-09-05 DIAGNOSIS — I5032 Chronic diastolic (congestive) heart failure: Secondary | ICD-10-CM

## 2010-09-05 DIAGNOSIS — I509 Heart failure, unspecified: Secondary | ICD-10-CM

## 2010-09-05 DIAGNOSIS — R0602 Shortness of breath: Secondary | ICD-10-CM

## 2010-09-05 LAB — BASIC METABOLIC PANEL
BUN: 54 mg/dL — ABNORMAL HIGH (ref 6–23)
CO2: 34 mEq/L — ABNORMAL HIGH (ref 19–32)
Calcium: 9.2 mg/dL (ref 8.4–10.5)
Chloride: 97 mEq/L (ref 96–112)
Creatinine, Ser: 2.2 mg/dL — ABNORMAL HIGH (ref 0.4–1.2)

## 2010-09-05 LAB — BRAIN NATRIURETIC PEPTIDE: Pro B Natriuretic peptide (BNP): 244 pg/mL — ABNORMAL HIGH (ref 0.0–100.0)

## 2010-09-06 ENCOUNTER — Encounter: Payer: Self-pay | Admitting: Cardiology

## 2010-09-06 ENCOUNTER — Other Ambulatory Visit: Payer: Medicare Other | Admitting: *Deleted

## 2010-09-06 ENCOUNTER — Ambulatory Visit (INDEPENDENT_AMBULATORY_CARE_PROVIDER_SITE_OTHER): Payer: Medicare Other | Admitting: Cardiology

## 2010-09-06 DIAGNOSIS — I4891 Unspecified atrial fibrillation: Secondary | ICD-10-CM

## 2010-09-06 DIAGNOSIS — N183 Chronic kidney disease, stage 3 unspecified: Secondary | ICD-10-CM

## 2010-09-06 DIAGNOSIS — I5032 Chronic diastolic (congestive) heart failure: Secondary | ICD-10-CM

## 2010-09-06 DIAGNOSIS — I10 Essential (primary) hypertension: Secondary | ICD-10-CM

## 2010-09-06 DIAGNOSIS — I509 Heart failure, unspecified: Secondary | ICD-10-CM

## 2010-09-06 DIAGNOSIS — I421 Obstructive hypertrophic cardiomyopathy: Secondary | ICD-10-CM

## 2010-09-06 DIAGNOSIS — I6789 Other cerebrovascular disease: Secondary | ICD-10-CM

## 2010-09-06 NOTE — Assessment & Plan Note (Signed)
Chronic diastolic CHF in the setting of hypertrophic cardiomyopathy.  She does not seem to have significant LV outflow obstruction. Volume status is much better with diuresis.  Symptoms are NYHA class III but improved.  Weight is down 12 lbs and BNP is down.  She is comfortable walking around her house.  Unfortunately, BUN/creatinine are now up.  I suspect that there is a component of cardiorenal failure.   - With rise in creatinine, cut back on metolozone to twice a week (Tuesday/Saturday). Hold off on taking another dose until next Tuesday.  Marland Kitchen

## 2010-09-06 NOTE — Assessment & Plan Note (Signed)
Suspect cardiorenal interaction.  Rise in creatinine with diuresis, will back off on metolozone (see above).  BMET next week.

## 2010-09-06 NOTE — Patient Instructions (Signed)
Decrease Metolazone to 2.5mg  on Tuesdays and Saturdays 30 minutes before you take Torsemide. Do not take one this Saturday.  Schedule an appointment for lab in 10 days--BMP/BNP 428.32  425.1  Schedule an appointment to see Dr Shirlee Latch in 4 weeks.

## 2010-09-06 NOTE — Progress Notes (Signed)
PCP: Dr. [name]  77 yo with history of diastolic CHF in the setting of hypertrophic cardiomyopathy, chronic atrial fibrillation, and CKD presents for cardiology evaluation. At last appointment, she reported a significant increase in the last few month in her chronic exertional dyspnea.  She had gained 26 lbs since her last appointment in this office.  She was not urinating much despite taking torsemide 200 mg qam.  For the last 3 months, she had been short of breath walking around in her house and with minimal housework.   She had severe orthopnea and has to sleep sitting up.   She was noted to be significantly volume overloaded on exam.  I started her on metolozone 2.5 mg every other day 30 minutes before torsemide.  She has now lost 12 lbs.  She is breathing better.  She is able to walk around her house without shortness of breath. She continues to do most of the housework with little help.  Orthopnea is improved.  She is wearing compression stockings.  Creatinine and BUN have increased mildly.    Labs (2/12): BNP 521, K 4, creatinine 1.8 Labs (7/12): BNP 526 => 244, creatinine 1.9 => 2.2, K 4.1   PMH: 1. St.Jude PCM  2. Chronic atrial fibrillation: on warfarin. 3. H/o CVA 4. Depression 5. CKD 6. DM 7. HTN 8. Hyperlipidemia 9. Hypertrophic cardiomyopathy with diastolic CHF: Echo (9/10) with EDF 55-60%, severe asymptomatic septal hypertrophy, mild MR, PA systolic pressure 53 mmHg.   10.  Nonobstructive CAD: 6/07 cath with luminal irregularities.   SH: Married, has 7 children.  Never smoked.    FH: CAD, CVA  ROS: All systems reviewed and negative except as per HPI.   Current Outpatient Prescriptions  Medication Sig Dispense Refill  . aspirin 81 MG chewable tablet Chew 81 mg by mouth daily.        . benazepril (LOTENSIN) 20 MG tablet TAKE ONE TABLET BY MOUTH EVERY DAY  90 tablet  3  . carvedilol (COREG) 12.5 MG tablet Take 1 and 1/2 tablets twice a day.  90 tablet  6  . diltiazem  (TIAZAC) 180 MG 24 hr capsule Take 1 capsule (180 mg total) by mouth daily.  30 capsule  12  . docusate sodium (COLACE) 100 MG capsule Take 100 mg by mouth 2 (two) times daily as needed.        . ferrous sulfate 325 (65 FE) MG tablet Take 325 mg by mouth daily.        Marland Kitchen glimepiride (AMARYL) 4 MG tablet Take 1 tablet (4 mg total) by mouth daily.  30 tablet  12  . NON FORMULARY Oxygen; 2 L, Dx 428.22 86% on RA       . omeprazole (PRILOSEC) 20 MG capsule TAKE ONE CAPSULE BY MOUTH EVERY DAY  30 capsule  12  . polyethylene glycol (MIRALAX / GLYCOLAX) packet Take 17 g by mouth daily.        . potassium chloride SA (KLOR-CON M20) 20 MEQ tablet Take 1 tablet daily. One the days that you take metolazone take 2 tablets daily.  45 tablet  6  . simvastatin (ZOCOR) 20 MG tablet TAKE ONE TABLET BY MOUTH AT BEDTIME  30 tablet  12  . torsemide (DEMADEX) 100 MG tablet Take 2 tablets daily  30 tablet  12  . warfarin (COUMADIN) 2 MG tablet Take 1 tablet (2 mg total) by mouth daily. Dose varies currently 2 tabs three days per week and 1 tabs 4  days per week.  60 tablet  12  . DISCONTD: metolazone (ZAROXOLYN) 2.5 MG tablet Take 1 tablet every other day. Take 30 minutes before you take Torsemide(demadex)  15 tablet  6  . DISCONTD: metolazone (ZAROXOLYN) 2.5 MG tablet every 3 (three) days. Take 1 tablet every other day. Take 30 minutes before you take Torsemide(demadex)       . metolazone (ZAROXOLYN) 2.5 MG tablet Take one tablet on Tuesdays and Saturdays 30 minutes before you take torsemide  30 tablet  11    BP 138/85  Pulse 77  Ht 5\' 5"  (1.651 m)  Wt 162 lb (73.483 kg)  BMI 26.96 kg/m2 General: NAD Neck: JVP 8-9 cm, no thyromegaly or thyroid nodule.  Lungs: Crackles left base.  CV: Nondisplaced PMI.  Heart irregular S1/S2, no S3/S4, 1/6 systolic murmur along the sternal border.  1+ ankle edema (improved). No carotid bruit.  Pedal pulses hard to palpate with lower extremity swelling.  Abdomen: Soft, nontender, no  hepatosplenomegaly, no distention.  Neurologic: Alert and oriented x 3.  Psych: Depressed affect. Extremities: No clubbing or cyanosis.

## 2010-09-06 NOTE — Assessment & Plan Note (Addendum)
BP reasonably controlled.

## 2010-09-07 NOTE — Assessment & Plan Note (Signed)
H/o CVA.  Should be bridged if she has to come off coumadin given atrial fibrillation with history of CVA.   I offered to arrange a home health RN but she refused.

## 2010-09-07 NOTE — Assessment & Plan Note (Signed)
Chronic atrial fibrillation.  Continue warfarin, diltiazem CD, Toprol XL.

## 2010-09-18 ENCOUNTER — Other Ambulatory Visit (INDEPENDENT_AMBULATORY_CARE_PROVIDER_SITE_OTHER): Payer: Medicare Other | Admitting: *Deleted

## 2010-09-18 DIAGNOSIS — I5032 Chronic diastolic (congestive) heart failure: Secondary | ICD-10-CM

## 2010-09-18 DIAGNOSIS — I421 Obstructive hypertrophic cardiomyopathy: Secondary | ICD-10-CM

## 2010-09-18 DIAGNOSIS — R0989 Other specified symptoms and signs involving the circulatory and respiratory systems: Secondary | ICD-10-CM

## 2010-09-18 DIAGNOSIS — I509 Heart failure, unspecified: Secondary | ICD-10-CM

## 2010-09-18 LAB — BASIC METABOLIC PANEL
BUN: 58 mg/dL — ABNORMAL HIGH (ref 6–23)
CO2: 33 mEq/L — ABNORMAL HIGH (ref 19–32)
Chloride: 92 mEq/L — ABNORMAL LOW (ref 96–112)
Creatinine, Ser: 2.5 mg/dL — ABNORMAL HIGH (ref 0.4–1.2)
Glucose, Bld: 169 mg/dL — ABNORMAL HIGH (ref 70–99)

## 2010-09-18 LAB — BRAIN NATRIURETIC PEPTIDE: Pro B Natriuretic peptide (BNP): 410 pg/mL — ABNORMAL HIGH (ref 0.0–100.0)

## 2010-09-20 ENCOUNTER — Other Ambulatory Visit: Payer: Self-pay | Admitting: *Deleted

## 2010-09-20 DIAGNOSIS — I509 Heart failure, unspecified: Secondary | ICD-10-CM

## 2010-09-20 DIAGNOSIS — I4891 Unspecified atrial fibrillation: Secondary | ICD-10-CM

## 2010-10-04 ENCOUNTER — Ambulatory Visit (INDEPENDENT_AMBULATORY_CARE_PROVIDER_SITE_OTHER): Payer: Medicare Other | Admitting: Cardiology

## 2010-10-04 ENCOUNTER — Other Ambulatory Visit (INDEPENDENT_AMBULATORY_CARE_PROVIDER_SITE_OTHER): Payer: Medicare Other | Admitting: *Deleted

## 2010-10-04 ENCOUNTER — Encounter: Payer: Self-pay | Admitting: Cardiology

## 2010-10-04 DIAGNOSIS — I509 Heart failure, unspecified: Secondary | ICD-10-CM

## 2010-10-04 DIAGNOSIS — I421 Obstructive hypertrophic cardiomyopathy: Secondary | ICD-10-CM

## 2010-10-04 DIAGNOSIS — I6789 Other cerebrovascular disease: Secondary | ICD-10-CM

## 2010-10-04 DIAGNOSIS — R0989 Other specified symptoms and signs involving the circulatory and respiratory systems: Secondary | ICD-10-CM

## 2010-10-04 DIAGNOSIS — I4891 Unspecified atrial fibrillation: Secondary | ICD-10-CM

## 2010-10-04 DIAGNOSIS — R0602 Shortness of breath: Secondary | ICD-10-CM

## 2010-10-04 DIAGNOSIS — I5032 Chronic diastolic (congestive) heart failure: Secondary | ICD-10-CM

## 2010-10-04 LAB — BASIC METABOLIC PANEL
BUN: 62 mg/dL — ABNORMAL HIGH (ref 6–23)
CO2: 37 mEq/L — ABNORMAL HIGH (ref 19–32)
Calcium: 9.4 mg/dL (ref 8.4–10.5)
Creatinine, Ser: 2.2 mg/dL — ABNORMAL HIGH (ref 0.4–1.2)
Glucose, Bld: 112 mg/dL — ABNORMAL HIGH (ref 70–99)

## 2010-10-04 NOTE — Assessment & Plan Note (Addendum)
Patient likely a component of cardiorenal failure.  She is also taking daily NSAIDs.  I asked her to stop all NSAIDs as these are likely contributing to her fluid retention and renal failure.  Instead, she will use Tylenol.  If this is inadequate, I would consider giving her a low dose of tramadol.

## 2010-10-04 NOTE — Assessment & Plan Note (Signed)
Chronic atrial fibrillation.  Continue warfarin, diltiazem CD, Coreg.  Good rate control. No falls.

## 2010-10-04 NOTE — Patient Instructions (Addendum)
Your physician has recommended you make the following change in your medication: Stop all Advil/Ibuprofen/anti-inflammatory medications.  Start Tylenol  325mg  one to two tabs three times per day.  Call our office if this is not helping your pain.   Your physician recommends that you schedule a follow-up appointment in: 6 weeks  Your physician recommends that you return for lab work in: BNP, BMP today (diag: 428.32, 425.1

## 2010-10-04 NOTE — Assessment & Plan Note (Signed)
H/o CVA.  Should be bridged if she has to come off coumadin given atrial fibrillation with history of CVA.   Followup in 6 weeks.

## 2010-10-04 NOTE — Progress Notes (Signed)
PCP: Dr. [name]  77 yo with history of diastolic CHF in the setting of hypertrophic cardiomyopathy, chronic atrial fibrillation, and CKD presents for cardiology evaluation. Earlier this summer, she reported an increase in her chronic exertional dyspnea.  She had gained 26 lbs.  She was not urinating much despite taking torsemide 200 mg qam.  She had been short of breath walking around in her house and with minimal housework.   She had severe orthopnea and has to sleep sitting up.   [name] has been breathing better since starting metolozone.  Her creatinine increased to as high as 2.5, so I cut back on her metolozone dosing and decreased torsemide to 100 mg daily.  She is on home oxygen.  She rarely gets out of the house.  She is able to walk around her house now with only mild shortness of breath (improved). She continues to do most of the housework with little help.  Orthopnea is improved and she says she slept on 1 pillow last night.  She has not cut back on the salt in her diet as her husband has told her he cannot eat low sodium foods.  He is also apparently opposed to her having a home health aide.  Patient also tells me today that she has been taking between 2 and 4 Advils a day for aches and pains.  She had been taking more than this a couple of months ago.    Labs (2/12): BNP 521, K 4, creatinine 1.8 Labs (7/12): BNP 526 => 244, creatinine 1.9 => 2.2, K 4.1  Labs (8/12): BNP 410, K 3.3, creatinine 2.5  PMH: 1. St.Jude PCM  2. Chronic atrial fibrillation: on warfarin. 3. H/o CVA 4. Depression 5. CKD 6. DM 7. HTN 8. Hyperlipidemia 9. Hypertrophic cardiomyopathy with diastolic CHF: Echo (9/10) with EDF 55-60%, severe asymptomatic septal hypertrophy, no significant LV outflow tract gradient, mild MR, PA systolic pressure 53 mmHg.   10.  Nonobstructive CAD: 6/07 cath with luminal irregularities.   SH: Married, has 7 children.  Never smoked.    FH: CAD, CVA  ROS: All systems reviewed  and negative except as per HPI.   Current Outpatient Prescriptions  Medication Sig Dispense Refill  . aspirin 81 MG chewable tablet Chew 81 mg by mouth daily.        . benazepril (LOTENSIN) 20 MG tablet TAKE ONE TABLET BY MOUTH EVERY DAY  90 tablet  3  . carvedilol (COREG) 12.5 MG tablet Take 1 and 1/2 tablets twice a day.  90 tablet  6  . diltiazem (TIAZAC) 180 MG 24 hr capsule Take 1 capsule (180 mg total) by mouth daily.  30 capsule  12  . docusate sodium (COLACE) 100 MG capsule Take 100 mg by mouth 2 (two) times daily as needed.        . ferrous sulfate 325 (65 FE) MG tablet Take 325 mg by mouth daily.        Marland Kitchen glimepiride (AMARYL) 4 MG tablet Take 1 tablet (4 mg total) by mouth daily.  30 tablet  12  . metolazone (ZAROXOLYN) 2.5 MG tablet Take one tablet on Tuesdays and Saturdays 30 minutes before you take torsemide  30 tablet  11  . NON FORMULARY Oxygen; 2 L, Dx 428.22 86% on RA       . omeprazole (PRILOSEC) 20 MG capsule TAKE ONE CAPSULE BY MOUTH EVERY DAY  30 capsule  12  . polyethylene glycol (MIRALAX / GLYCOLAX) packet Take 17  g by mouth daily.        . potassium chloride SA (KLOR-CON M20) 20 MEQ tablet Take 1 tablet daily. One the days that you take metolazone take 2 tablets daily.  45 tablet  6  . simvastatin (ZOCOR) 20 MG tablet TAKE ONE TABLET BY MOUTH AT BEDTIME  30 tablet  12  . warfarin (COUMADIN) 2 MG tablet Take 1 tablet (2 mg total) by mouth daily. Dose varies currently 2 tabs three days per week and 1 tabs 4 days per week.  60 tablet  12  . DISCONTD: torsemide (DEMADEX) 100 MG tablet Take 2 tablets daily  30 tablet  12  . acetaminophen (TYLENOL) 325 MG tablet One to two tablets three times daily as needed      . torsemide (DEMADEX) 100 MG tablet Take 1 tablet (100 mg total) by mouth daily.        BP 130/80  Pulse 64  Resp 20  Ht 5\' 5"  (1.651 m)  Wt 165 lb 6.4 oz (75.025 kg)  BMI 27.52 kg/m2 General: NAD Neck: JVP 8-9 cm, no thyromegaly or thyroid nodule.  Lungs:  Slight crackles at bases bilaterally.   CV: Nondisplaced PMI.  Heart irregular S1/S2, no S3/S4, 1/6 systolic murmur along the sternal border.  No edema. No carotid bruit.   Abdomen: Soft, nontender, no hepatosplenomegaly, no distention.  Neurologic: Alert and oriented x 3.  Psych: Depressed affect. Extremities: No clubbing or cyanosis.

## 2010-10-04 NOTE — Assessment & Plan Note (Signed)
Chronic diastolic CHF in the setting of hypertrophic cardiomyopathy.  She does not have significant LV outflow obstruction.  She is still volume overloaded but symptoms are improved with increased diuresis.  Unfortunately, BUN/creatinine are higher.  I have cut back on her torsemide to 100 mg daily and decreased metolozone to twice a week.  I suspect that there is a component of cardiorenal failure.   - Continue metolozone twice a week 30 minutes before torsemide and continue torsemide 100 mg daily.  - BMET/BNP today.  Marland Kitchen

## 2010-10-05 ENCOUNTER — Telehealth: Payer: Self-pay | Admitting: *Deleted

## 2010-10-05 DIAGNOSIS — E876 Hypokalemia: Secondary | ICD-10-CM

## 2010-10-05 DIAGNOSIS — I5032 Chronic diastolic (congestive) heart failure: Secondary | ICD-10-CM

## 2010-10-05 MED ORDER — POTASSIUM CHLORIDE CRYS ER 20 MEQ PO TBCR: 20.0000 meq | EXTENDED_RELEASE_TABLET | Freq: Two times a day (BID) | ORAL | Status: DC

## 2010-10-05 NOTE — Telephone Encounter (Signed)
Message copied by Jacqlyn Krauss on Thu Oct 05, 2010  3:36 PM ------      Message from: Laurey Morale      Created: Wed Oct 04, 2010  6:03 PM       Increase KCl to 40 meq daily.   BMET in 2 wks.

## 2010-10-05 NOTE — Telephone Encounter (Signed)
Notes Recorded by Jacqlyn Krauss, RN on 10/05/2010 at 3:36 PM I talked with pt. She will increase KCL to 40 mEq daily. She will return for [facility] 10/19/10. Notes Recorded by [name], MD on 10/04/2010 at 6:03 PM Increase KCl to 40 meq daily. BMET in 2 wks.

## 2010-10-19 ENCOUNTER — Telehealth: Payer: Self-pay | Admitting: *Deleted

## 2010-10-19 ENCOUNTER — Other Ambulatory Visit (INDEPENDENT_AMBULATORY_CARE_PROVIDER_SITE_OTHER): Payer: Medicare Other | Admitting: *Deleted

## 2010-10-19 DIAGNOSIS — I5032 Chronic diastolic (congestive) heart failure: Secondary | ICD-10-CM

## 2010-10-19 DIAGNOSIS — E876 Hypokalemia: Secondary | ICD-10-CM

## 2010-10-19 DIAGNOSIS — I509 Heart failure, unspecified: Secondary | ICD-10-CM

## 2010-10-19 LAB — BASIC METABOLIC PANEL
BUN: 82 mg/dL — ABNORMAL HIGH (ref 6–23)
Calcium: 9.2 mg/dL (ref 8.4–10.5)
Creatinine, Ser: 2.4 mg/dL — ABNORMAL HIGH (ref 0.4–1.2)
GFR: 21.04 mL/min — ABNORMAL LOW (ref >60.00)
Glucose, Bld: 135 mg/dL — ABNORMAL HIGH (ref 70–99)

## 2010-10-19 MED ORDER — TRAMADOL HCL 50 MG PO TABS: ORAL_TABLET | ORAL | Status: DC

## 2010-10-19 NOTE — Telephone Encounter (Signed)
Notes Recorded by Jacqlyn Krauss, RN on 10/19/2010 at 5:28 PM I reviewed with Dr Shirlee Latch. He recommended pt hold torsemide and metolazone tomorrow, decrease metolazone to 1x weekly, decrease torsemide to 80mg  daily. Repeat BMET in 10 days. I talked with pt. She has 100mg  torsemide tablet so she will decrease to 3/4 100mg  tablet daily. She also states ES Tylenol is not helping and is requesting something stronger for her neck pain. Dr Shirlee Latch recommended Tramadol 50mg  bid prn. Pt will return for BMET 10/30/10.

## 2010-10-24 ENCOUNTER — Other Ambulatory Visit: Payer: Self-pay | Admitting: Cardiology

## 2010-10-24 DIAGNOSIS — E876 Hypokalemia: Secondary | ICD-10-CM

## 2010-10-24 DIAGNOSIS — I5032 Chronic diastolic (congestive) heart failure: Secondary | ICD-10-CM

## 2010-10-24 MED ORDER — POTASSIUM CHLORIDE CRYS ER 20 MEQ PO TBCR: 20.0000 meq | EXTENDED_RELEASE_TABLET | Freq: Two times a day (BID) | ORAL | Status: DC

## 2010-10-24 NOTE — Telephone Encounter (Signed)
Pt said her torsemide was decreased and pt needs a smaller mq tablet called in.

## 2010-10-30 ENCOUNTER — Encounter: Payer: Self-pay | Admitting: Internal Medicine

## 2010-10-30 ENCOUNTER — Ambulatory Visit (INDEPENDENT_AMBULATORY_CARE_PROVIDER_SITE_OTHER): Payer: Medicare Other | Admitting: *Deleted

## 2010-10-30 ENCOUNTER — Other Ambulatory Visit (INDEPENDENT_AMBULATORY_CARE_PROVIDER_SITE_OTHER): Payer: Medicare Other | Admitting: *Deleted

## 2010-10-30 DIAGNOSIS — I509 Heart failure, unspecified: Secondary | ICD-10-CM

## 2010-10-30 DIAGNOSIS — I495 Sick sinus syndrome: Secondary | ICD-10-CM

## 2010-10-30 DIAGNOSIS — Z23 Encounter for immunization: Secondary | ICD-10-CM

## 2010-10-30 DIAGNOSIS — I5032 Chronic diastolic (congestive) heart failure: Secondary | ICD-10-CM

## 2010-10-30 LAB — BASIC METABOLIC PANEL
BUN: 43 mg/dL — ABNORMAL HIGH (ref 6–23)
CO2: 29 mEq/L (ref 19–32)
Chloride: 105 mEq/L (ref 96–112)
GFR: 22.69 mL/min — ABNORMAL LOW (ref >60.00)
Glucose, Bld: 154 mg/dL — ABNORMAL HIGH (ref 70–99)
Potassium: 4.4 mEq/L (ref 3.5–5.1)
Sodium: 141 mEq/L (ref 135–145)

## 2010-11-02 ENCOUNTER — Telehealth: Payer: Self-pay | Admitting: Cardiology

## 2010-11-02 DIAGNOSIS — I5032 Chronic diastolic (congestive) heart failure: Secondary | ICD-10-CM

## 2010-11-02 DIAGNOSIS — E876 Hypokalemia: Secondary | ICD-10-CM

## 2010-11-02 MED ORDER — POTASSIUM CHLORIDE CRYS ER 20 MEQ PO TBCR: 20.0000 meq | EXTENDED_RELEASE_TABLET | Freq: Two times a day (BID) | ORAL | Status: DC

## 2010-11-02 NOTE — Telephone Encounter (Signed)
Pt wants to talk you about rx for potassium

## 2010-11-02 NOTE — Telephone Encounter (Signed)
I talked with pt. She is requesting prescription to [facility] in [place] for KCL 20 mEq two daily.

## 2010-11-07 LAB — CROSSMATCH: Antibody Screen: NEGATIVE

## 2010-11-07 LAB — BASIC METABOLIC PANEL
BUN: 30 — ABNORMAL HIGH
CO2: 23
CO2: 25
CO2: 25
CO2: 25
Calcium: 8.3 — ABNORMAL LOW
Calcium: 8.7
Calcium: 8.8
Chloride: 105
Creatinine, Ser: 1.46 — ABNORMAL HIGH
Creatinine, Ser: 1.98 — ABNORMAL HIGH
GFR calc Af Amer: 32 — ABNORMAL LOW
GFR calc Af Amer: 40 — ABNORMAL LOW
GFR calc non Af Amer: 26 — ABNORMAL LOW
GFR calc non Af Amer: 33 — ABNORMAL LOW
Glucose, Bld: 104 — ABNORMAL HIGH
Glucose, Bld: 53 — ABNORMAL LOW
Glucose, Bld: 67 — ABNORMAL LOW
Glucose, Bld: 96
Potassium: 3.7
Sodium: 141
Sodium: 144
Sodium: 144

## 2010-11-07 LAB — CBC
HCT: 22.6 — ABNORMAL LOW
HCT: 26.4 — ABNORMAL LOW
HCT: 28.5 — ABNORMAL LOW
Hemoglobin: 7.4 — CL
Hemoglobin: 9 — ABNORMAL LOW
Hemoglobin: 9.2 — ABNORMAL LOW
MCHC: 32.8
MCHC: 33.2
MCHC: 34
MCV: 81.4
Platelets: 222
RBC: 3.36 — ABNORMAL LOW
RBC: 3.4 — ABNORMAL LOW
RDW: 17.3 — ABNORMAL HIGH
RDW: 17.6 — ABNORMAL HIGH
RDW: 17.7 — ABNORMAL HIGH
WBC: 7.7

## 2010-11-07 LAB — COMPREHENSIVE METABOLIC PANEL
Alkaline Phosphatase: 83
BUN: 36 — ABNORMAL HIGH
Chloride: 104
Creatinine, Ser: 2.16 — ABNORMAL HIGH
Glucose, Bld: 83
Potassium: 3.5
Total Bilirubin: 0.5
Total Protein: 6.6

## 2010-11-07 LAB — URINALYSIS, ROUTINE W REFLEX MICROSCOPIC
Bilirubin Urine: NEGATIVE
Glucose, UA: NEGATIVE
Hgb urine dipstick: NEGATIVE
Ketones, ur: NEGATIVE
Protein, ur: NEGATIVE

## 2010-11-07 LAB — B-NATRIURETIC PEPTIDE (CONVERTED LAB): Pro B Natriuretic peptide (BNP): 646 — ABNORMAL HIGH

## 2010-11-07 LAB — DIFFERENTIAL
Basophils Relative: 0
Eosinophils Absolute: 0.2
Eosinophils Relative: 3
Lymphs Abs: 0.8
Monocytes Relative: 8
Neutrophils Relative %: 76

## 2010-11-07 LAB — PROTIME-INR
INR: 2.4 — ABNORMAL HIGH
Prothrombin Time: 26.1 — ABNORMAL HIGH
Prothrombin Time: 26.7 — ABNORMAL HIGH

## 2010-11-07 LAB — ABO/RH: ABO/RH(D): O NEG

## 2010-11-07 LAB — IRON AND TIBC: Iron: 17 — ABNORMAL LOW

## 2010-11-07 LAB — OCCULT BLOOD X 1 CARD TO LAB, STOOL: Fecal Occult Bld: NEGATIVE

## 2010-11-13 ENCOUNTER — Ambulatory Visit (INDEPENDENT_AMBULATORY_CARE_PROVIDER_SITE_OTHER): Payer: Medicare Other | Admitting: Cardiology

## 2010-11-13 ENCOUNTER — Encounter: Payer: Self-pay | Admitting: Cardiology

## 2010-11-13 VITALS — BP 118/70 | HR 68 | Ht 65.0 in | Wt 167.0 lb

## 2010-11-13 DIAGNOSIS — I4891 Unspecified atrial fibrillation: Secondary | ICD-10-CM

## 2010-11-13 DIAGNOSIS — I5032 Chronic diastolic (congestive) heart failure: Secondary | ICD-10-CM

## 2010-11-13 DIAGNOSIS — I509 Heart failure, unspecified: Secondary | ICD-10-CM

## 2010-11-13 DIAGNOSIS — M1990 Unspecified osteoarthritis, unspecified site: Secondary | ICD-10-CM

## 2010-11-13 MED ORDER — TRAMADOL HCL 50 MG PO TABS: ORAL_TABLET | ORAL | Status: DC

## 2010-11-13 NOTE — Patient Instructions (Signed)
Continue torsemide 100mg  daily.  Continue metolazone 2.5mg  daily 30 minutes before you take torsemide on Saturdays.  Your physician recommends that you return for lab work in: 3 weeks--BMET/BNP 428.32  Your physician recommends that you schedule a follow-up appointment in: 6 weeks with Dr Shirlee Latch.

## 2010-11-13 NOTE — Assessment & Plan Note (Signed)
[name] continues to have significant orthopedic pain.  I called in a prescription for tramadol 50 mg bid prn for her to take in lieu of NSAIDS.

## 2010-11-13 NOTE — Assessment & Plan Note (Signed)
Chronic diastolic CHF in the setting of hypertrophic cardiomyopathy.  Last echo did not show significant outflow tract obstruction.  However, I think it is reasonable for her to stay off a vasodilator like benazepril given the risk of developing significant LV outflow tract obstruction with its use (and also given her elevated creatinine).  She is probably mildly volume overloaded.  However, she is improved from the past.  Balancing renal function with volume overload is going to be tricky with this patient.  At this time, I will leave her on torsemide 100 mg daily and metolozone 2.5 mg once a week.  I will get a BMET/BNP in 3 wks and see her back in the office in 6 wks.  Continuation of the negative inotropes Coreg and diltiazem will be useful in the setting of hypertrophic cardiomyopathy with asymmetric septal hypertrophy to minimize LV outflow tract gradient.

## 2010-11-13 NOTE — Assessment & Plan Note (Signed)
Chronic atrial fibrillation.  Continue warfarin, diltiazem CD, Coreg.  Good rate control. No falls.  

## 2010-11-13 NOTE — Progress Notes (Signed)
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  Creatinine was noted to rise over the last few weeks and I asked her to decrease metolozone to once a week and torsemide to 75 mg daily.  She did decrease the metolozone but is still taking the torsemide at 100 mg daily.  Weight is essentially stable.  Creatinine is down from 2.5 to 2.2 when last checked.  Breathing is stable.  She is wearing home oxygen and is able to walk around her house without exertional dyspnea.  She is able to do the cooking and most of the cleaning.  She is short of breath with walking longer distances or walking up steps or an incline.  No chest pain.  She has stopped taking Ibuprofen but never started tramadol (apparently was called into the wrong pharmacy).  She continues to have pain in her posterior neck and in her right hip.  She also has not been taking benazepril for several months now though it is on her medication list.    Labs (2/12): BNP 521, K 4, creatinine 1.8 Labs (7/12): BNP 526 => 244, creatinine 1.9 => 2.2, K 4.1  Labs (8/12): BNP 410, K 3.3, creatinine 2.5 Labs (9/12): K 4.4, creatinine 2.2  ECG: atrial fibrillation, v-paced  PMH: 1. St.Jude PCM  2. Chronic atrial fibrillation: on warfarin. 3. H/o CVA 4. Depression 5. CKD 6. DM 7. HTN 8. Hyperlipidemia 9. Hypertrophic cardiomyopathy with diastolic CHF: Echo (9/10) with EDF 55-60%, severe asymptomatic septal hypertrophy, no significant LV outflow tract gradient, mild MR, PA systolic pressure 53 mmHg.   10.  Nonobstructive CAD: 6/07 cath with luminal irregularities.   SH: Married, has 7 children.  Never smoked.    FH: CAD, CVA   Current Outpatient Prescriptions  Medication Sig Dispense Refill  . acetaminophen (TYLENOL) 325 MG tablet One to two tablets three times daily as needed      . aspirin 81 MG chewable tablet Chew 81  mg by mouth daily.        . carvedilol (COREG) 12.5 MG tablet Take 1 and 1/2 tablets twice a day.  90 tablet  6  . diltiazem (TIAZAC) 180 MG 24 hr capsule Take 1 capsule (180 mg total) by mouth daily.  30 capsule  12  . docusate sodium (COLACE) 100 MG capsule Take 100 mg by mouth 2 (two) times daily as needed.        . ferrous sulfate 325 (65 FE) MG tablet Take 325 mg by mouth daily.        Marland Kitchen glimepiride (AMARYL) 4 MG tablet Take 1 tablet (4 mg total) by mouth daily.  30 tablet  12  . metolazone (ZAROXOLYN) 2.5 MG tablet Take 1 time weekly 30 minutes before you take torsemide      . NON FORMULARY Oxygen; 2 L, Dx 428.22 86% on RA       . omeprazole (PRILOSEC) 20 MG capsule TAKE ONE CAPSULE BY MOUTH EVERY DAY  30 capsule  12  . polyethylene glycol (MIRALAX / GLYCOLAX) packet Take 17 g by mouth daily.        . potassium chloride SA (KLOR-CON M20) 20 MEQ tablet Take 1 tablet (20 mEq total) by mouth 2 (two) times daily.  60 tablet  11  . simvastatin (ZOCOR) 20 MG tablet TAKE ONE TABLET BY MOUTH AT BEDTIME  30 tablet  12  .  warfarin (COUMADIN) 2 MG tablet Take 1 tablet (2 mg total) by mouth daily. Dose varies currently 2 tabs three days per week and 1 tabs 4 days per week.  60 tablet  12  . DISCONTD: torsemide (DEMADEX) 100 MG tablet Take 3/4 tablet daily      . torsemide (DEMADEX) 100 MG tablet Take 1 tablet (100 mg total) by mouth daily.      . traMADol (ULTRAM) 50 MG tablet Take one tablet twice a day  30 tablet  0    BP 118/70  Pulse 68  Ht 5\' 5"  (1.651 m)  Wt 167 lb (75.751 kg)  BMI 27.79 kg/m2 General: NAD Neck: JVP 8 cm, no thyromegaly or thyroid nodule.  Lungs: Slight crackles at bases bilaterally.   CV: Nondisplaced PMI.  Heart irregular S1/S2, no S3/S4, 1/6 systolic murmur along the sternal border.  No edema. No carotid bruit.   Abdomen: Soft, nontender, no hepatosplenomegaly, no distention.  Neurologic: Alert and oriented x 3.  Psych: Depressed affect. Extremities: No clubbing or  cyanosis.

## 2010-11-13 NOTE — Assessment & Plan Note (Signed)
Renal function better with decrease in metolozone to once a week.  Will continue to follow.

## 2010-11-14 LAB — GLUCOSE, CAPILLARY
Glucose-Capillary: 125 — ABNORMAL HIGH
Glucose-Capillary: 155 — ABNORMAL HIGH

## 2010-12-05 ENCOUNTER — Ambulatory Visit (INDEPENDENT_AMBULATORY_CARE_PROVIDER_SITE_OTHER): Payer: Medicare Other | Admitting: *Deleted

## 2010-12-05 DIAGNOSIS — R0989 Other specified symptoms and signs involving the circulatory and respiratory systems: Secondary | ICD-10-CM

## 2010-12-05 DIAGNOSIS — E876 Hypokalemia: Secondary | ICD-10-CM

## 2010-12-05 DIAGNOSIS — I509 Heart failure, unspecified: Secondary | ICD-10-CM

## 2010-12-05 DIAGNOSIS — I5032 Chronic diastolic (congestive) heart failure: Secondary | ICD-10-CM

## 2010-12-05 DIAGNOSIS — R0609 Other forms of dyspnea: Secondary | ICD-10-CM

## 2010-12-05 LAB — BASIC METABOLIC PANEL
BUN: 49 mg/dL — ABNORMAL HIGH (ref 6–23)
CO2: 32 mEq/L (ref 19–32)
Calcium: 8.6 mg/dL (ref 8.4–10.5)
Chloride: 93 mEq/L — ABNORMAL LOW (ref 96–112)
Creatinine, Ser: 3.1 mg/dL — ABNORMAL HIGH (ref 0.4–1.2)
GFR: 15.66 mL/min — ABNORMAL LOW (ref >60.00)
Glucose, Bld: 215 mg/dL — ABNORMAL HIGH (ref 70–99)
Potassium: 3.1 mEq/L — ABNORMAL LOW (ref 3.5–5.1)
Sodium: 138 mEq/L (ref 135–145)

## 2010-12-05 LAB — BRAIN NATRIURETIC PEPTIDE: Pro B Natriuretic peptide (BNP): 244 pg/mL — ABNORMAL HIGH (ref 0.0–100.0)

## 2010-12-05 MED ORDER — POTASSIUM CHLORIDE CRYS ER 20 MEQ PO TBCR: 20.0000 meq | EXTENDED_RELEASE_TABLET | Freq: Two times a day (BID) | ORAL | Status: DC

## 2010-12-07 ENCOUNTER — Telehealth: Payer: Self-pay | Admitting: *Deleted

## 2010-12-07 NOTE — Telephone Encounter (Signed)
Notes Recorded by Jacqlyn Krauss, RN on 12/07/2010 at 11:36 AM I talked with pt. Pt did not take any medication yesterday. Pt will stop metolazone, take KCL to 20 mEq daily, and take 3/4 of a 100mg  torsemide daily. She will return for [facility] 12/13/10. Notes Recorded by [name], MD on 12/06/2010 at 8:48 PM Will adjust earlier suggestion: Stop metolozone, hold torsemide for 1 day, then decrease torsemide to 60 mg daily. She will need BMET in 1 week. Increase K by 20 mEq total daily. Notes Recorded by [name], MD on 12/06/2010 at 8:47 PM Creatinine is higher. Stop metolozone. Hold torsemide for 1 day then decrease to 80 mg daily. Repeat BMET in 1 week. Notes Recorded by Alois Cliche, LPN on 16/11/9602 at 5:28 PM REVIEWED LABS WITH DR Graciela Husbands PER DR Graciela Husbands PT TO TAKE KCL 20 MEQ BID HOLD METALOZONE FOR 2 WEEKS AND DECREASE TORESMIDE TO 50 MG FOR 1 WEEK AND REPEAT BMET IN 1 WEEK./CY PT AWARE OF ABOVE./CY

## 2010-12-13 ENCOUNTER — Other Ambulatory Visit (INDEPENDENT_AMBULATORY_CARE_PROVIDER_SITE_OTHER): Payer: Medicare Other | Admitting: *Deleted

## 2010-12-13 DIAGNOSIS — E876 Hypokalemia: Secondary | ICD-10-CM

## 2010-12-13 LAB — BASIC METABOLIC PANEL
Chloride: 102 mEq/L (ref 96–112)
GFR: 20.05 mL/min — ABNORMAL LOW (ref >60.00)
Glucose, Bld: 278 mg/dL — ABNORMAL HIGH (ref 70–99)
Potassium: 3.6 mEq/L (ref 3.5–5.1)
Sodium: 141 mEq/L (ref 135–145)

## 2010-12-14 ENCOUNTER — Other Ambulatory Visit: Payer: Self-pay | Admitting: Family Medicine

## 2010-12-14 NOTE — Telephone Encounter (Signed)
Refill request

## 2010-12-26 ENCOUNTER — Ambulatory Visit (INDEPENDENT_AMBULATORY_CARE_PROVIDER_SITE_OTHER): Payer: Medicare Other | Admitting: Cardiology

## 2010-12-26 ENCOUNTER — Encounter: Payer: Self-pay | Admitting: Cardiology

## 2010-12-26 VITALS — BP 154/90 | HR 78 | Ht 65.0 in | Wt 180.0 lb

## 2010-12-26 DIAGNOSIS — I2510 Atherosclerotic heart disease of native coronary artery without angina pectoris: Secondary | ICD-10-CM

## 2010-12-26 DIAGNOSIS — N183 Chronic kidney disease, stage 3 unspecified: Secondary | ICD-10-CM

## 2010-12-26 DIAGNOSIS — I509 Heart failure, unspecified: Secondary | ICD-10-CM

## 2010-12-26 DIAGNOSIS — I5032 Chronic diastolic (congestive) heart failure: Secondary | ICD-10-CM

## 2010-12-26 DIAGNOSIS — R0602 Shortness of breath: Secondary | ICD-10-CM

## 2010-12-26 DIAGNOSIS — I4891 Unspecified atrial fibrillation: Secondary | ICD-10-CM

## 2010-12-26 LAB — BASIC METABOLIC PANEL
CO2: 25 mEq/L (ref 19–32)
Chloride: 106 mEq/L (ref 96–112)
Creatinine, Ser: 2.1 mg/dL — ABNORMAL HIGH (ref 0.4–1.2)
Glucose, Bld: 112 mg/dL — ABNORMAL HIGH (ref 70–99)
Sodium: 141 mEq/L (ref 135–145)

## 2010-12-26 NOTE — Assessment & Plan Note (Signed)
CKD stage IV.  Volume management is made more difficult by renal dysfunction.  Will need to follow creatinine carefully.

## 2010-12-26 NOTE — Assessment & Plan Note (Signed)
Luminal irregularities on prior cath.  She is on warfarin, so I do not think that she needs to be on aspirin also (can stop this).

## 2010-12-26 NOTE — Assessment & Plan Note (Signed)
Patient is more volume overloaded with NYHA class IIIb symptoms.  Weight is up 13 lbs.  Likely cause for this is lower diuretic dosing.  She is also not following a particularly low sodium diet.  - Increase torsemide to 100 mg daily and KCl to 40 mEq daily.  - BMET/BNP today.  - Followup in 2 weeks with BMET/BNP.

## 2010-12-26 NOTE — Progress Notes (Signed)
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  After last appointment, creatinine was noted to rise to 3.1.  Torsemide was decreased to 50 mg daily and metolazone was stopped.  Creatinine decreased to 2.5, but her weight is up 13 lbs.  She has become more short of breath, now dyspneic walking around her house.  She continues to use home oxygen. No chest pain.  She actually denies orthopnea or PND.    Patient has been having a difficult time at home.  She does not get along well with her husband and feels like her children are not interested in helping her.  She would like to go to a nursing home but says that she will not be able to due to her debts.  I offered to have a Child psychotherapist talk to her, but she says that her husband would not allow it.   Labs (2/12): BNP 521, K 4, creatinine 1.8 Labs (7/12): BNP 526 => 244, creatinine 1.9 => 2.2, K 4.1  Labs (8/12): BNP 410, K 3.3, creatinine 2.5 Labs (9/12): K 4.4, creatinine 2.2 Labs (10/12): K 3.6, creatinine 3.1 => 2.5  PMH: 1. St.Jude PCM  2. Chronic atrial fibrillation: on warfarin. 3. H/o CVA 4. Depression 5. CKD 6. DM 7. HTN 8. Hyperlipidemia 9. Hypertrophic cardiomyopathy with diastolic CHF: Echo (9/10) with EDF 55-60%, severe asymptomatic septal hypertrophy, no significant LV outflow tract gradient, mild MR, PA systolic pressure 53 mmHg.   10.  Nonobstructive CAD: 6/07 cath with luminal irregularities.   SH: Married, has 7 children.  Never smoked.  Lives in [place].  FH: CAD, CVA  ROS: All systems reviewed and negative except as per HPI.    Current Outpatient Prescriptions  Medication Sig Dispense Refill  . acetaminophen (TYLENOL) 325 MG tablet One to two tablets three times daily as needed      . aspirin 81 MG chewable tablet Chew 81 mg by mouth daily.        . carvedilol (COREG) 12.5 MG tablet TAKE ONE  TABLET BY MOUTH TWICE DAILY  60 tablet  12  . diltiazem (TIAZAC) 180 MG 24 hr capsule Take 1 capsule (180 mg total) by mouth daily.  30 capsule  12  . docusate sodium (COLACE) 100 MG capsule Take 100 mg by mouth 2 (two) times daily as needed.        . ferrous sulfate 325 (65 FE) MG tablet Take 325 mg by mouth daily.        Marland Kitchen glimepiride (AMARYL) 4 MG tablet Take 1 tablet (4 mg total) by mouth daily.  30 tablet  12  . NON FORMULARY Oxygen; 2 L, Dx 428.22 86% on RA       . omeprazole (PRILOSEC) 20 MG capsule TAKE ONE CAPSULE BY MOUTH EVERY DAY  30 capsule  12  . polyethylene glycol (MIRALAX / GLYCOLAX) packet Take 17 g by mouth daily.        . potassium chloride SA (K-DUR,KLOR-CON) 20 MEQ tablet Take 1 tablet (20 mEq total) by mouth daily.      . simvastatin (ZOCOR) 20 MG tablet TAKE ONE TABLET BY MOUTH AT BEDTIME  30 tablet  12  . warfarin (COUMADIN) 2 MG tablet Take 1 tablet (2 mg total) by mouth daily. Dose varies currently 2 tabs three days per week and 1 tabs 4 days per week.  60 tablet  12  . DISCONTD: torsemide (DEMADEX) 100 MG tablet Take 50 mg by mouth daily.       Marland Kitchen torsemide (DEMADEX) 100 MG tablet Take 1 tablet (100 mg total) by mouth daily.        BP 154/90  Pulse 78  Ht 5\' 5"  (1.651 m)  Wt 81.647 kg (180 lb)  BMI 29.95 kg/m2  SpO2 92% General: NAD Neck: JVP 14-16 cm, no thyromegaly or thyroid nodule.  Lungs: Slight crackles at bases bilaterally.   CV: Nondisplaced PMI.  Heart irregular S1/S2, no S3/S4, 1/6 systolic murmur along the sternal border.  1+ edema 1/4 up lower legs bilaterally. No carotid bruit.   Abdomen: Soft, nontender, no hepatosplenomegaly, no distention.  Neurologic: Alert and oriented x 3.  Psych: Depressed affect. Extremities: No clubbing or cyanosis.

## 2010-12-26 NOTE — Assessment & Plan Note (Signed)
Chronic atrial fibrillation.  Continue warfarin, diltiazem CD, Coreg.  Good rate control. No falls.  

## 2010-12-26 NOTE — Patient Instructions (Signed)
Increase torsemide to 100mg  daily.  Lab today--BMP/BNP 428.32  Your physician recommends that you schedule a follow-up appointment in: 2 weeks with Dr Shirlee Latch.   Your physician recommends that you return for lab work in: 2 weeks when you see Dr Frutoso Chase Si Raider 428.32

## 2010-12-27 ENCOUNTER — Telehealth: Payer: Self-pay | Admitting: *Deleted

## 2010-12-27 NOTE — Telephone Encounter (Signed)
Notes Recorded by Jacqlyn Krauss, RN on 12/27/2010 at 11:39 AM I talked with pt. Pt is taking KCL 20 meq daily. Pt notified to stop aspirin. ------  Notes Recorded by Jacqlyn Krauss, RN on 12/27/2010 at 8:58 AM NA ------  Notes Recorded by [name], MD on 12/26/2010 at 11:34 PM Do not have Mrs Leisure increase KCl (keep at 20). Do have her stop ASA.

## 2011-01-09 ENCOUNTER — Encounter: Payer: Self-pay | Admitting: Home Health Services

## 2011-01-16 ENCOUNTER — Other Ambulatory Visit: Payer: Self-pay | Admitting: Family Medicine

## 2011-01-16 NOTE — Telephone Encounter (Signed)
Refill request

## 2011-01-21 ENCOUNTER — Other Ambulatory Visit: Payer: Self-pay

## 2011-01-25 ENCOUNTER — Ambulatory Visit: Payer: Medicare Other | Admitting: Home Health Services

## 2011-01-29 ENCOUNTER — Encounter: Payer: Self-pay | Admitting: Internal Medicine

## 2011-01-29 DIAGNOSIS — I495 Sick sinus syndrome: Secondary | ICD-10-CM

## 2011-01-30 ENCOUNTER — Encounter: Payer: Self-pay | Admitting: *Deleted

## 2011-02-28 ENCOUNTER — Encounter: Payer: Self-pay | Admitting: Internal Medicine

## 2011-03-16 ENCOUNTER — Other Ambulatory Visit: Payer: Self-pay | Admitting: Family Medicine

## 2011-03-16 NOTE — Telephone Encounter (Signed)
Refill request

## 2011-03-21 ENCOUNTER — Telehealth: Payer: Self-pay | Admitting: Cardiology

## 2011-03-21 NOTE — Telephone Encounter (Signed)
New Problem   Patient would like a return call from nurse at [facility] to discuss appnt necessity.

## 2011-03-21 NOTE — Telephone Encounter (Signed)
Talked with pt. She is not having any problems but is overdue for an appt. She states she has not had any transportation. Appt made for pt with Dr Shirlee Latch  03/26/11.

## 2011-03-26 ENCOUNTER — Ambulatory Visit (INDEPENDENT_AMBULATORY_CARE_PROVIDER_SITE_OTHER): Payer: Medicare Other | Admitting: Cardiology

## 2011-03-26 ENCOUNTER — Encounter: Payer: Self-pay | Admitting: Cardiology

## 2011-03-26 VITALS — BP 146/82 | HR 86 | Ht 65.0 in | Wt 171.0 lb

## 2011-03-26 DIAGNOSIS — R0602 Shortness of breath: Secondary | ICD-10-CM

## 2011-03-26 DIAGNOSIS — I4891 Unspecified atrial fibrillation: Secondary | ICD-10-CM

## 2011-03-26 DIAGNOSIS — I509 Heart failure, unspecified: Secondary | ICD-10-CM

## 2011-03-26 DIAGNOSIS — I10 Essential (primary) hypertension: Secondary | ICD-10-CM

## 2011-03-26 DIAGNOSIS — I5032 Chronic diastolic (congestive) heart failure: Secondary | ICD-10-CM

## 2011-03-26 DIAGNOSIS — I498 Other specified cardiac arrhythmias: Secondary | ICD-10-CM

## 2011-03-26 LAB — BASIC METABOLIC PANEL
CO2: 27 mEq/L (ref 19–32)
Calcium: 9.3 mg/dL (ref 8.4–10.5)
Chloride: 104 mEq/L (ref 96–112)
Sodium: 139 mEq/L (ref 135–145)

## 2011-03-26 LAB — BRAIN NATRIURETIC PEPTIDE: Pro B Natriuretic peptide (BNP): 268 pg/mL — ABNORMAL HIGH (ref 0.0–100.0)

## 2011-03-26 MED ORDER — DILTIAZEM HCL ER COATED BEADS 240 MG PO CP24: 240.0000 mg | ORAL_CAPSULE | Freq: Every day | ORAL | Status: DC

## 2011-03-26 NOTE — Assessment & Plan Note (Signed)
Chronic atrial fibrillation.  Continue warfarin, diltiazem CD (increasing today), Coreg.  Good rate control. No falls.

## 2011-03-26 NOTE — Assessment & Plan Note (Addendum)
Hypertrophic cardiomyopathy without significant LVOT obstruction on last echo.  Volume status much better compared to prior appointment.  Weight is down 9 lbs.  She appears only mildly volume overloaded at the most.  Symptoms are stable NYHA class III.  Will continue current torsemide and will check BMET/BNP today.

## 2011-03-26 NOTE — Progress Notes (Signed)
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 Since I last saw her, she has been doing fairly well.  Weight is down 9 lbs.  She had a prolonged flu-like illness around Christmas but is better from this.  No falls, though she has some gait stability.  She stopped aspirin and is just on coumadin.  She has stable exertional dyspnea when she walks fast, up an incline, or does housework.  No chest pain.  BP has been running mildly high.   Labs (2/12): BNP 521, K 4, creatinine 1.8 Labs (7/12): BNP 526 => 244, creatinine 1.9 => 2.2, K 4.1  Labs (8/12): BNP 410, K 3.3, creatinine 2.5 Labs (9/12): K 4.4, creatinine 2.2 Labs (10/12): K 3.6, creatinine 3.1 => 2.5 Labs (11/12): K 4.8, creatinine 2.1  ECG: Atrial fibrillation with lateral TWIs  PMH: 1. St.Jude PCM  2. Chronic atrial fibrillation: on warfarin. 3. H/o CVA 4. Depression 5. CKD 6. DM 7. HTN 8. Hyperlipidemia 9. Hypertrophic cardiomyopathy with diastolic CHF: Echo (9/10) with EDF 55-60%, severe asymptomatic septal hypertrophy, no significant LV outflow tract gradient, mild MR, PA systolic pressure 53 mmHg.   10.  Nonobstructive CAD: 6/07 cath with luminal irregularities.   SH: Married, has 7 children.  Never smoked.  Lives in [place].  FH: CAD, CVA  ROS: All systems reviewed and negative except as per HPI.    Current Outpatient Prescriptions  Medication Sig Dispense Refill  . acetaminophen (TYLENOL) 325 MG tablet One to two tablets three times daily as needed              . benazepril (LOTENSIN) 20 MG tablet TAKE ONE TABLET BY MOUTH EVERY DAY  90 tablet  3  . carvedilol (COREG) 12.5 MG tablet TAKE ONE TABLET BY MOUTH TWICE DAILY  60 tablet  12  . docusate sodium (COLACE) 100 MG capsule Take 100 mg by mouth 2 (two) times daily as needed.        . ferrous sulfate 325 (65 FE) MG tablet Take 325 mg by mouth  daily.        Marland Kitchen glimepiride (AMARYL) 4 MG tablet Take 1 tablet (4 mg total) by mouth daily.  30 tablet  12  . NON FORMULARY Oxygen; 2 L, Dx 428.22 86% on RA       . omeprazole (PRILOSEC) 20 MG capsule TAKE ONE CAPSULE BY MOUTH EVERY DAY  30 capsule  12  . polyethylene glycol (MIRALAX / GLYCOLAX) packet Take 17 g by mouth daily.        . potassium chloride SA (K-DUR,KLOR-CON) 20 MEQ tablet Take 1 tablet (20 mEq total) by mouth daily.      . simvastatin (ZOCOR) 20 MG tablet TAKE ONE TABLET BY MOUTH AT BEDTIME  30 tablet  12  . torsemide (DEMADEX) 100 MG tablet Take 1 tablet (100 mg total) by mouth daily.      Marland Kitchen warfarin (COUMADIN) 2 MG tablet Take 1 tablet (2 mg total) by mouth daily. Dose varies currently 2 tabs three days per week and 1 tabs 4 days per week.  60 tablet  12  . diltiazem (CARTIA XT) 240 MG 24 hr capsule Take 1 capsule (240 mg total) by mouth daily.  30 capsule  11  . DISCONTD: diltiazem (CARDIZEM CD) 180 MG 24 hr capsule TAKE ONE CAPSULE BY MOUTH EVERY DAY  30 capsule  12  BP 146/82  Pulse 86  Ht 5\' 5"  (1.651 m)  Wt 77.565 kg (171 lb)  BMI 28.46 kg/m2 General: NAD Neck: JVP 8 cm, no thyromegaly or thyroid nodule.  Lungs: Slight crackles at bases bilaterally.   CV: Nondisplaced PMI.  Heart irregular S1/S2, no S3/S4, 1/6 systolic murmur along the sternal border.  No edema. No carotid bruit.   Abdomen: Soft, nontender, no hepatosplenomegaly, no distention.  Neurologic: Alert and oriented x 3.  Psych: Depressed affect. Extremities: No clubbing or cyanosis.

## 2011-03-26 NOTE — Patient Instructions (Signed)
Increase diltiazem CD to 240mg  daily.   Your physician recommends that you return for lab work today--BMET/BNP 427.89   Your physician recommends that you schedule a follow-up appointment in: 2 months with Dr Shirlee Latch

## 2011-03-26 NOTE — Assessment & Plan Note (Signed)
Increase diltiazem CD to 240mg daily 

## 2011-04-14 ENCOUNTER — Other Ambulatory Visit: Payer: Self-pay | Admitting: Family Medicine

## 2011-04-14 DIAGNOSIS — I4891 Unspecified atrial fibrillation: Secondary | ICD-10-CM

## 2011-04-15 NOTE — Telephone Encounter (Signed)
Refill request

## 2011-04-16 NOTE — Assessment & Plan Note (Signed)
Called and spoke to patient upon receiving refill request.  She is uncertain if cards is checking PT or not - it is not in the results section and she does not recall any finger pricks.  Emphasized the need to PT to be checked.  Transportation is difficult.  Emphasized the point that she will need a lab check before further refills made.

## 2011-04-16 NOTE — Telephone Encounter (Signed)
I don't see where she has had any INRs in our office, thought being checked at family practice but looks like not.  Thurston Hole, please contact [name] and have her come in asap for INR.  Needs to get set up for regular followup.  Will have to stop coumadin if she cannot come in for this.  She could be a candidate for Xarelto or Pradaxa.  Will look into this if she is not going to get regular coumadin checks (though cost may be an issue).

## 2011-04-16 NOTE — Telephone Encounter (Signed)
Can we see if she can get a visiting nurse to draw the INR?

## 2011-04-17 NOTE — Telephone Encounter (Signed)
See if she will agree to have visiting nurse draw INR.  She needs regular INR checks or she cannot be on coumadin.  If she cannot be on coumadin, she has high risk of stroke.  Her creatinine is too high to safely take Pradaxa, Xarelto.

## 2011-04-19 ENCOUNTER — Telehealth: Payer: Self-pay | Admitting: *Deleted

## 2011-04-19 NOTE — Telephone Encounter (Signed)
   Leane Para - 3:24 PM ','<More Detail >>       [name], MD         Sent:  Tue April 17, 2011 5:22 PM   To:  Jacqlyn Krauss, RN                 Message     FYI-Vencent Hauschild ----- Message ----- From: [name], MD Sent: 04/16/2011 10:21 PM To: Jacqlyn Krauss, RN  I talked with Ms Mccart. She says she has transportation issues and she cannot come here for an INR before her appt with Dr Shirlee Latch 05/25/11. She says she asked the doctor in [place] to check it and they would not. The only place she goes is to pharmacy the first of the month to get her med filled. Jeniyah Menor  ----- Message ----- From: [name], MD Sent: 04/16/2011 4:09 PM To: Sanjuana Letters, MD, Jacqlyn Krauss, RN  ----- Message from Sanjuana Letters, MD sent at 04/16/2011 2:18 PM -----  I refilled Ms Attwood's coumadin. She hasn't seen me in >8months and I see no INRs. I asked her to see me and maybe she will. If not, please check an INR next time she sees you. Thanks. Bill The demographic information from the pharmacy is: Patient Name: MARTIZA, SPETH Patient DOB: 1930/07/16 Patient Gender: Female Address: [address] [place] [address]   04/19/11--Dr Shirlee Latch had recommended a Home Health referral to see it home health nurse could check PTs at pt's home. Pt declined home health referral. Pt did state she was going to make an appt with Dr [name] to have PT  next week. She states it is more convenient to have it checked at Dr [facility] office than here.

## 2011-04-19 NOTE — Telephone Encounter (Signed)
Please make sure she understands that unless she gets the INR checked regularly she cannot take coumadin.  If she does not take coumadin, she could have a stroke.

## 2011-04-23 NOTE — Telephone Encounter (Signed)
Pt scheduled for protime today at [facility].

## 2011-04-24 ENCOUNTER — Encounter: Payer: Self-pay | Admitting: Family Medicine

## 2011-04-24 ENCOUNTER — Ambulatory Visit (INDEPENDENT_AMBULATORY_CARE_PROVIDER_SITE_OTHER): Payer: Medicare Other | Admitting: Family Medicine

## 2011-04-24 ENCOUNTER — Ambulatory Visit (INDEPENDENT_AMBULATORY_CARE_PROVIDER_SITE_OTHER): Payer: Medicare Other | Admitting: *Deleted

## 2011-04-24 VITALS — BP 127/68 | HR 79 | Temp 98.1°F | Ht 65.0 in | Wt 179.1 lb

## 2011-04-24 DIAGNOSIS — I4891 Unspecified atrial fibrillation: Secondary | ICD-10-CM

## 2011-04-24 DIAGNOSIS — E1165 Type 2 diabetes mellitus with hyperglycemia: Secondary | ICD-10-CM

## 2011-04-24 DIAGNOSIS — K219 Gastro-esophageal reflux disease without esophagitis: Secondary | ICD-10-CM | POA: Insufficient documentation

## 2011-04-24 DIAGNOSIS — L723 Sebaceous cyst: Secondary | ICD-10-CM

## 2011-04-24 DIAGNOSIS — E118 Type 2 diabetes mellitus with unspecified complications: Secondary | ICD-10-CM

## 2011-04-24 DIAGNOSIS — I2510 Atherosclerotic heart disease of native coronary artery without angina pectoris: Secondary | ICD-10-CM

## 2011-04-24 DIAGNOSIS — Z7901 Long term (current) use of anticoagulants: Secondary | ICD-10-CM

## 2011-04-24 LAB — POCT INR: INR: 2.3

## 2011-04-24 MED ORDER — OMEPRAZOLE 20 MG PO CPDR: 20.0000 mg | DELAYED_RELEASE_CAPSULE | Freq: Every day | ORAL | Status: DC

## 2011-04-24 NOTE — Assessment & Plan Note (Addendum)
Symptom of spitting most likely related to reflux.  Weight stable, history not consistent with dysphagia due to mass and does not have symptoms of aspiration.   Patient has not been taking PPI, I have refilled for her and will follow-up with PCP in 1 month

## 2011-04-24 NOTE — Progress Notes (Signed)
  Subjective:    Patient ID: [name], female    DOB: 11/05/1930, 78 y.o.   MRN: [id number]  HPI Work in appt to check "spot on face"  Also brings up she would like to talk about spitting up.  Spot on face:  Left side face- firm cyst- has been there for " a long time"   Feels it larger than it once was.  No itching, pain, bleeding.  Has remote hx of melanoma, is no longer under care of dermatologist.  Spitting up:  Notes has not had an appetite for months to years but concerned she continues to gain weight.  States she does not eat much and her primary intake is soda and chocolate milk.  Described that at least daily- will spit up about a mouthful of of liquid but can continue to finish her drink. No trouble actually swallowing.   No nausea, vomiting or abdominal pain.  Notes a sour taste in her mouth, worse with laying flat.  No dyspnea or LE edema.  I have reviewed patient's  PMH, FH, and Social history and Medications as related to this visit.   Review of Systemssee HPI     Objective:   Physical Exam  GEN: Alert & Oriented, No acute distress CV:  Regular Rate & Rhythm, no murmur Respiratory:  Normal work of breathing, CTAB Abd:  + BS, soft, no tenderness to palpation Skin:  Located on left cheek, approx 1 cm sebaceous cyst- firm, nontender, no erythema, central punctation of pore noted.       Assessment & Plan:

## 2011-04-24 NOTE — Patient Instructions (Signed)
I refilled your omeprazole to help with acid reflux  Make follow-up appointment with Dr. [name]

## 2011-04-24 NOTE — Assessment & Plan Note (Signed)
Sebaceous cyst on left face, no evidence of infection or malignancy.  She elects to continue to monitor.

## 2011-04-24 NOTE — Assessment & Plan Note (Signed)
a1c at goal today

## 2011-04-30 ENCOUNTER — Encounter: Payer: Self-pay | Admitting: Internal Medicine

## 2011-04-30 DIAGNOSIS — I495 Sick sinus syndrome: Secondary | ICD-10-CM

## 2011-05-16 ENCOUNTER — Other Ambulatory Visit: Payer: Self-pay | Admitting: Family Medicine

## 2011-05-25 ENCOUNTER — Ambulatory Visit: Payer: Medicare Other | Admitting: Cardiology

## 2011-05-25 ENCOUNTER — Ambulatory Visit: Payer: Medicare Other

## 2011-05-30 ENCOUNTER — Encounter: Payer: Self-pay | Admitting: Internal Medicine

## 2011-05-30 ENCOUNTER — Ambulatory Visit (INDEPENDENT_AMBULATORY_CARE_PROVIDER_SITE_OTHER): Payer: Medicare Other | Admitting: Physician Assistant

## 2011-05-30 ENCOUNTER — Ambulatory Visit (INDEPENDENT_AMBULATORY_CARE_PROVIDER_SITE_OTHER): Payer: Medicare Other | Admitting: *Deleted

## 2011-05-30 ENCOUNTER — Encounter: Payer: Self-pay | Admitting: Physician Assistant

## 2011-05-30 VITALS — BP 126/79 | HR 88 | Ht 65.0 in | Wt 166.0 lb

## 2011-05-30 DIAGNOSIS — I495 Sick sinus syndrome: Secondary | ICD-10-CM

## 2011-05-30 DIAGNOSIS — R0602 Shortness of breath: Secondary | ICD-10-CM

## 2011-05-30 DIAGNOSIS — I4891 Unspecified atrial fibrillation: Secondary | ICD-10-CM

## 2011-05-30 DIAGNOSIS — R0601 Orthopnea: Secondary | ICD-10-CM | POA: Insufficient documentation

## 2011-05-30 LAB — BASIC METABOLIC PANEL
BUN: 51 mg/dL — ABNORMAL HIGH (ref 6–23)
CO2: 27 mEq/L (ref 19–32)
Calcium: 8.8 mg/dL (ref 8.4–10.5)
Chloride: 104 mEq/L (ref 96–112)
Creatinine, Ser: 2.3 mg/dL — ABNORMAL HIGH (ref 0.4–1.2)
GFR: 22.19 mL/min — ABNORMAL LOW (ref >60.00)
Glucose, Bld: 96 mg/dL (ref 70–99)
Potassium: 4.6 mEq/L (ref 3.5–5.1)
Sodium: 140 mEq/L (ref 135–145)

## 2011-05-30 LAB — PACEMAKER DEVICE OBSERVATION
BATTERY VOLTAGE: 2.78 V
BRDY-0002RV: 50 {beats}/min
RV LEAD THRESHOLD: 0.625 V
VENTRICULAR PACING PM: 19

## 2011-05-30 LAB — BRAIN NATRIURETIC PEPTIDE: Pro B Natriuretic peptide (BNP): 312 pg/mL — ABNORMAL HIGH (ref 0.0–100.0)

## 2011-05-30 NOTE — Progress Notes (Signed)
PPM check 

## 2011-05-30 NOTE — Assessment & Plan Note (Signed)
Patient seems to think her breathing is getting worse. She has had an echo done since 2010. There is no evidence of heart failure on exam today. We will recheck an echo.

## 2011-05-30 NOTE — Patient Instructions (Signed)
Your physician recommends that you schedule a follow-up appointment in: 2 months with Dr Shirlee Latch Your physician recommends that you have lab work drawn today (BMP & BNP) Your physician has requested that you have an echocardiogram. Echocardiography is a painless test that uses sound waves to create images of your heart. It provides your doctor with information about the size and shape of your heart and how well your heart's chambers and valves are working. This procedure takes approximately one hour. There are no restrictions for this procedure.

## 2011-05-30 NOTE — Progress Notes (Signed)
HPI:  This is an elderly 78 year old white female patient of Dr. Sherlie Ban who has history of diastolic heart failure in the setting of hypertrophic cardiomyopathy, chronic atrial fibrillation, and chronic kidney disease. She is here for routine followup and pacer check. She complains of 2 pillow orthopnea since January. She saw Dr. Shirlee Latch in February and he felt her heart failure was compensated. She also is having a lot of trouble with dysphagia of liquids and solids. She has not seen a gastroenterologist concerning this. Her last echo was in 2010 at which time her ejection fraction was 55-60% with severe asymmetric septal hypertrophy, no significant left ventricular outflow tract gradient, mild MR, PA systolic pressure of 53 mmHg.  Patient wears home oxygen. She has stable exertional dyspnea. She denies chest pain, palpitations, dizziness, or presyncope.  Allergies  Allergen Reactions  . Codeine Phosphate     REACTION: rash  . Hydrocodone     REACTION: nausea and pruritis  . Paroxetine     REACTION: hallucinations and falls  . Tramadol Hcl     REACTION: rash and itching    Current Outpatient Prescriptions on File Prior to Visit  Medication Sig Dispense Refill  . acetaminophen (TYLENOL) 325 MG tablet One to two tablets three times daily as needed      . benazepril (LOTENSIN) 20 MG tablet TAKE ONE TABLET BY MOUTH EVERY DAY  90 tablet  3  . carvedilol (COREG) 12.5 MG tablet TAKE ONE TABLET BY MOUTH TWICE DAILY  60 tablet  12  . diltiazem (CARTIA XT) 240 MG 24 hr capsule Take 1 capsule (240 mg total) by mouth daily.  30 capsule  11  . docusate sodium (COLACE) 100 MG capsule Take 100 mg by mouth 2 (two) times daily as needed.        . ferrous sulfate 325 (65 FE) MG tablet Take 325 mg by mouth daily.        Marland Kitchen glimepiride (AMARYL) 4 MG tablet TAKE ONE TABLET BY MOUTH EVERY DAY  30 tablet  12  . NON FORMULARY Oxygen; 2 L, Dx 428.22 86% on RA       . omeprazole (PRILOSEC) 20 MG capsule Take 1  capsule (20 mg total) by mouth daily.  30 capsule  12  . polyethylene glycol (MIRALAX / GLYCOLAX) packet Take 17 g by mouth daily.        . potassium chloride SA (K-DUR,KLOR-CON) 20 MEQ tablet Take 1 tablet (20 mEq total) by mouth daily.      . simvastatin (ZOCOR) 20 MG tablet TAKE ONE TABLET BY MOUTH AT BEDTIME  30 tablet  12  . torsemide (DEMADEX) 100 MG tablet Take 1 tablet (100 mg total) by mouth daily.      Marland Kitchen warfarin (COUMADIN) 2 MG tablet TAKE ONE TABLET BY MOUTH EVERY DAY.  DOSE VARIES CURRENTLY 2 TABLETS 3 DAYS A WEEK AND 1 TABLET 4 DAYS PER WEEK  60 tablet  0    Past Medical History  Diagnosis Date  . Cardiac pacemaker     Dr. Juanda Chance; [employer]. Jude VVI  . Macular hole of left eye     told will lose sight  . NICM (nonischemic cardiomyopathy)   . Atrial fibrillation     off anticoagulation presumed to anemia  . Coronary atherosclerosis     and old MI and hx of CVA   . Depression   . Dysphagia   . DM (diabetes mellitus)     nueropathy  . CRI (chronic  renal insufficiency)     cr basleine 1.5-1.6  . HTN (hypertension)   . HLD (hyperlipidemia)   . Incontinence   . CHF (congestive heart failure)     related to dyastolic dysfunction  . CAD (coronary artery disease)     nonobstructive  . Osteoarthritis   . Scoliosis   . Repeated falls     hx  . Stasis dermatitis   . Hearing loss   . Anemia   . CHF (congestive heart failure)     cardiacc ath EF 25-30%    Past Surgical History  Procedure Date  . Bliateral foot surgery 08/19/01  . Bladder tack 08/19/01  . Hysterectomy and bso 08/19/01  . Melanoma excision 08/19/01  . Pacemaker placement     x2    Family History  Problem Relation Age of Onset  . Heart attack      family hx  . Colon cancer      family hx  . Stroke      family hx  . Diabetes      DM - family hx     History   Social History  . Marital Status: Married    Spouse Name: N/A    Number of Children: N/A  . Years of Education: N/A   Occupational History    . Not on file.   Social History Main Topics  . Smoking status: Never Smoker   . Smokeless tobacco: Not on file   Comment: non smoker   . Alcohol Use: No  . Drug Use: Not on file  . Sexually Active: Not on file   Other Topics Concern  . Not on file   Social History Narrative   Lives in [place] with husband of 55 years. Was Production designer, theatre/television/film in a convenience store. Financial assistance approved for 100% discount after Medicare pays for [facility] only, not eligible for [facility] card. Rudell Cobb 12/23/09.     ROS:she complains of chronic pain in the back of her left neck that is unchanged,See history of present illness otherwise negative.  PHYSICAL EXAM: Well-nournished, in no acute distress. Neck: No JVD, HJR, Bruit, or thyroid enlargement Lungs: No tachypnea, clear without wheezing, rales, or rhonchi Cardiovascular: irregular irregular with 2/6 systolic murmur at the left sternal border and apex, no gallops, bruit, thrill, or heave. Abdomen: BS normal. Soft without organomegaly, masses, lesions or tenderness. Extremities: without cyanosis, clubbing or edema. Good distal pulses bilateral SKin: Warm, no lesions or rashes  Musculoskeletal: No deformities Neuro: no focal signs  BP 126/79  Pulse 88  Ht 5\' 5"  (1.651 m)  Wt 166 lb (75.297 kg)  BMI 27.62 kg/m2   WUJ:WJXBJY fibrillation at 88 beats per minute

## 2011-05-30 NOTE — Assessment & Plan Note (Signed)
Patient complains of orthopnea since January. There is no evidence of heart failure on exam today. She had no evidence of heart failure when she saw Dr. Shirlee Latch in February. We will check a BNP and BMP today. Her last echo was in 2010. We will recheck this.

## 2011-05-30 NOTE — Assessment & Plan Note (Signed)
Patient has chronic atrial fibrillation with controlled ventricular rate. She is on Coumadin.

## 2011-05-30 NOTE — Assessment & Plan Note (Signed)
Patient pacer check was stable today.

## 2011-06-01 ENCOUNTER — Telehealth: Payer: Self-pay | Admitting: *Deleted

## 2011-06-01 DIAGNOSIS — I421 Obstructive hypertrophic cardiomyopathy: Secondary | ICD-10-CM

## 2011-06-01 NOTE — Telephone Encounter (Signed)
I reviewed lab results with pt. She has already taken her torsemide 100mg  today.  Saturday and Sunday she will take 50mg  Return for BMP on Wednesday. Mylo Red RN

## 2011-06-01 NOTE — Telephone Encounter (Signed)
Message copied by Barrie Folk on Fri Jun 01, 2011  2:37 PM ------      Message from: Prescott Gum      Created: Fri Jun 01, 2011  2:11 PM       BNP up slightly. Renal function worse. Decrease Demadex to 50mg  for 2 days. Make sure she has f/u BMP 1 week. & appt with MD

## 2011-06-06 ENCOUNTER — Other Ambulatory Visit: Payer: Medicare Other

## 2011-06-13 ENCOUNTER — Other Ambulatory Visit: Payer: Self-pay | Admitting: Family Medicine

## 2011-06-13 NOTE — Telephone Encounter (Signed)
While she has not been in my office for quite some time, I see that she has made visits to cards and that they are following her INR.  As such, I will refill her coumadin.

## 2011-06-19 ENCOUNTER — Ambulatory Visit (HOSPITAL_COMMUNITY): Payer: Medicare Other | Attending: Cardiology

## 2011-06-19 ENCOUNTER — Other Ambulatory Visit: Payer: Self-pay

## 2011-06-19 DIAGNOSIS — I10 Essential (primary) hypertension: Secondary | ICD-10-CM | POA: Insufficient documentation

## 2011-06-19 DIAGNOSIS — E785 Hyperlipidemia, unspecified: Secondary | ICD-10-CM | POA: Insufficient documentation

## 2011-06-19 DIAGNOSIS — I059 Rheumatic mitral valve disease, unspecified: Secondary | ICD-10-CM | POA: Insufficient documentation

## 2011-06-19 DIAGNOSIS — I509 Heart failure, unspecified: Secondary | ICD-10-CM

## 2011-06-19 DIAGNOSIS — E119 Type 2 diabetes mellitus without complications: Secondary | ICD-10-CM | POA: Insufficient documentation

## 2011-06-19 DIAGNOSIS — I4891 Unspecified atrial fibrillation: Secondary | ICD-10-CM

## 2011-06-19 DIAGNOSIS — I428 Other cardiomyopathies: Secondary | ICD-10-CM

## 2011-06-27 ENCOUNTER — Ambulatory Visit (INDEPENDENT_AMBULATORY_CARE_PROVIDER_SITE_OTHER): Payer: Medicare Other | Admitting: Family Medicine

## 2011-06-27 ENCOUNTER — Ambulatory Visit (INDEPENDENT_AMBULATORY_CARE_PROVIDER_SITE_OTHER): Payer: Medicare Other | Admitting: *Deleted

## 2011-06-27 ENCOUNTER — Encounter: Payer: Self-pay | Admitting: Family Medicine

## 2011-06-27 VITALS — BP 149/71 | HR 68 | Temp 98.8°F | Wt 185.5 lb

## 2011-06-27 DIAGNOSIS — N183 Chronic kidney disease, stage 3 unspecified: Secondary | ICD-10-CM

## 2011-06-27 DIAGNOSIS — I2510 Atherosclerotic heart disease of native coronary artery without angina pectoris: Secondary | ICD-10-CM

## 2011-06-27 DIAGNOSIS — I4891 Unspecified atrial fibrillation: Secondary | ICD-10-CM

## 2011-06-27 DIAGNOSIS — Z7901 Long term (current) use of anticoagulants: Secondary | ICD-10-CM

## 2011-06-27 DIAGNOSIS — I5032 Chronic diastolic (congestive) heart failure: Secondary | ICD-10-CM

## 2011-06-27 DIAGNOSIS — I509 Heart failure, unspecified: Secondary | ICD-10-CM

## 2011-06-27 DIAGNOSIS — L723 Sebaceous cyst: Secondary | ICD-10-CM

## 2011-06-27 LAB — POCT INR: INR: 3.5

## 2011-06-27 MED ORDER — POTASSIUM CHLORIDE CRYS ER 20 MEQ PO TBCR: 20.0000 meq | EXTENDED_RELEASE_TABLET | Freq: Every day | ORAL | Status: DC

## 2011-06-27 MED ORDER — SULFAMETHOXAZOLE-TRIMETHOPRIM 800-160 MG PO TABS: 1.0000 | ORAL_TABLET | Freq: Two times a day (BID) | ORAL | Status: AC

## 2011-06-27 MED ORDER — TORSEMIDE 100 MG PO TABS: 100.0000 mg | ORAL_TABLET | Freq: Two times a day (BID) | ORAL | Status: DC

## 2011-06-27 NOTE — Patient Instructions (Signed)
I sent an antibiotic to the pharmacy for your face. I refilled your potassium I want you to take two torsemide per day.  We need to get that fluid off or you will end up back in the hospital. See me or Dr. Jearld Pies next week to make sure the weight is coming off. I will check your coumadin level today.

## 2011-06-28 NOTE — Assessment & Plan Note (Signed)
Infected and ruptured.  Minimal surrounding cellulitis.  Septra

## 2011-06-28 NOTE — Assessment & Plan Note (Signed)
I am concerned that pushing diuresis will worsen renal function.  Will get BMP next week when seen in FU of CHF

## 2011-06-28 NOTE — Progress Notes (Signed)
  Subjective:    Patient ID: [name], female    DOB: 1930/11/09, 78 y.o.   MRN: [id number]  HPI  Multiple problems Her main complaint is a sebaceous cyst on her face that has ruptured and is red. She has increased SOB and her weight is up.  Known heart failure followed by Dr. Jearld Pies. On coumadin, due for INR check. Social, difficult situation with her angry, irritable husband who dislikes bringing her to doctor and now is apparently worse as he develops early dementia. Not a candidate for colonoscopy due to age and co morbid disease.  Not a candidate for zostavax due to cost.    Review of Systems     Objective:   Physical ExamSignificant wt gain noted. Lungs bibasilar crackles.   Cardiac rate controled Ext 3 + edema        Assessment & Plan:

## 2011-06-28 NOTE — Assessment & Plan Note (Signed)
INR slightly up and starting septra which will worsen.  Decrease dose of coumadin.

## 2011-06-28 NOTE — Assessment & Plan Note (Signed)
My main worry is acute on chronic CHF.  Double torsimide to 100 mg bid.  See next week by myself or Dr. Jearld Pies.

## 2011-07-10 ENCOUNTER — Telehealth: Payer: Self-pay | Admitting: Family Medicine

## 2011-07-10 NOTE — Telephone Encounter (Signed)
Discussed with [name] and instructed dosing as she has documented.

## 2011-07-10 NOTE — Telephone Encounter (Signed)
Per office note on 06/27/11--patient was due to return for INR in 1 week.  Patient does not have transportation and unable to come until office visit with Dr. [name] on 07/20/11.  Patient states she is not having any bleeding, but she has noticed some bruising on lower arms.  Wants to know if she should hold Coumadin.  Informed patient to continue taking Coumadin until we can check her INR.  Will route note to Dr. [name] for any additional advice and call patient back.  Gaylene Brooks, RN  Spoke with Dr. Tillman Sers patient decrease Coumadin to 2mg  daily until she comes in for office visit.  Patient informed of dosage change and was able to repeat dose back to me.  Patient states she has 2mg  tablets and will take one tablet daily until office visit next Friday.  Gaylene Brooks, RN

## 2011-07-10 NOTE — Telephone Encounter (Signed)
Patient said she was in the other day for blood work and Bassett asked her if she had any bruising.  At the time she didn't, but she does have quite a bit of bruising now and wants to know if she should stop taking the Coumadin.

## 2011-07-20 ENCOUNTER — Encounter: Payer: Self-pay | Admitting: Family Medicine

## 2011-07-20 ENCOUNTER — Ambulatory Visit (INDEPENDENT_AMBULATORY_CARE_PROVIDER_SITE_OTHER): Payer: Medicare Other | Admitting: Family Medicine

## 2011-07-20 ENCOUNTER — Ambulatory Visit (INDEPENDENT_AMBULATORY_CARE_PROVIDER_SITE_OTHER): Payer: Medicare Other | Admitting: *Deleted

## 2011-07-20 VITALS — BP 138/76 | HR 82 | Temp 98.1°F | Ht 65.0 in | Wt 183.0 lb

## 2011-07-20 DIAGNOSIS — I2510 Atherosclerotic heart disease of native coronary artery without angina pectoris: Secondary | ICD-10-CM

## 2011-07-20 DIAGNOSIS — Z515 Encounter for palliative care: Secondary | ICD-10-CM

## 2011-07-20 DIAGNOSIS — E118 Type 2 diabetes mellitus with unspecified complications: Secondary | ICD-10-CM

## 2011-07-20 DIAGNOSIS — N184 Chronic kidney disease, stage 4 (severe): Secondary | ICD-10-CM | POA: Insufficient documentation

## 2011-07-20 DIAGNOSIS — E1165 Type 2 diabetes mellitus with hyperglycemia: Secondary | ICD-10-CM

## 2011-07-20 DIAGNOSIS — I509 Heart failure, unspecified: Secondary | ICD-10-CM

## 2011-07-20 DIAGNOSIS — Z7901 Long term (current) use of anticoagulants: Secondary | ICD-10-CM

## 2011-07-20 DIAGNOSIS — I5032 Chronic diastolic (congestive) heart failure: Secondary | ICD-10-CM

## 2011-07-20 DIAGNOSIS — I4891 Unspecified atrial fibrillation: Secondary | ICD-10-CM

## 2011-07-20 LAB — POCT INR: INR: 1.7

## 2011-07-20 MED ORDER — HYDROCHLOROTHIAZIDE 25 MG PO TABS: 25.0000 mg | ORAL_TABLET | Freq: Every day | ORAL | Status: DC

## 2011-07-20 NOTE — Patient Instructions (Signed)
I sent one new prescription for you. [name] will tell you about coumadin doses. Stay on all your other meds.   Your diabetes is under good control. The breathing problem is mostly a fluid back up in your lungs.   See me in 2-3 weeks to make sure you are getting better

## 2011-07-21 LAB — BASIC METABOLIC PANEL
CO2: 30 mEq/L (ref 19–32)
Chloride: 103 mEq/L (ref 96–112)
Glucose, Bld: 93 mg/dL (ref 70–99)
Potassium: 4.4 mEq/L (ref 3.5–5.3)
Sodium: 143 mEq/L (ref 135–145)

## 2011-07-22 NOTE — Progress Notes (Signed)
  Subjective:    Patient ID: [name], female    DOB: 29-Sep-1930, 78 y.o.   MRN: [id number]  HPI  Still with DOE.  Patient is also up on weight.  She has been taking torsemide 200mg  daily.  Home situation remains difficult. It [birth year] her birthday.    Review of Systems     Objective:   Physical Exam Pulse Ox noted Neck veins up Lungs bibasilar crackles Cardiac irregular, rate controled. Bilateral pedal edema       Assessment & Plan:

## 2011-07-22 NOTE — Assessment & Plan Note (Signed)
Worsening CHF.  Add thiazide, recheck 2 weeks, sooner if worse sob

## 2011-07-22 NOTE — Assessment & Plan Note (Signed)
Check creat and k with increased dose of torsemide.

## 2011-07-22 NOTE — Assessment & Plan Note (Signed)
Well controled. 

## 2011-08-01 ENCOUNTER — Encounter: Payer: Self-pay | Admitting: Cardiology

## 2011-08-01 ENCOUNTER — Ambulatory Visit (INDEPENDENT_AMBULATORY_CARE_PROVIDER_SITE_OTHER): Payer: Medicare Other | Admitting: Cardiology

## 2011-08-01 VITALS — BP 124/70 | HR 71 | Ht 65.0 in | Wt 174.0 lb

## 2011-08-01 DIAGNOSIS — I5032 Chronic diastolic (congestive) heart failure: Secondary | ICD-10-CM

## 2011-08-01 DIAGNOSIS — N184 Chronic kidney disease, stage 4 (severe): Secondary | ICD-10-CM

## 2011-08-01 DIAGNOSIS — R0609 Other forms of dyspnea: Secondary | ICD-10-CM

## 2011-08-01 DIAGNOSIS — I4891 Unspecified atrial fibrillation: Secondary | ICD-10-CM

## 2011-08-01 DIAGNOSIS — R0989 Other specified symptoms and signs involving the circulatory and respiratory systems: Secondary | ICD-10-CM

## 2011-08-01 DIAGNOSIS — I509 Heart failure, unspecified: Secondary | ICD-10-CM

## 2011-08-01 LAB — BASIC METABOLIC PANEL
BUN: 78 mg/dL — ABNORMAL HIGH (ref 6–23)
CO2: 32 mEq/L (ref 19–32)
Chloride: 94 mEq/L — ABNORMAL LOW (ref 96–112)
Creatinine, Ser: 2.9 mg/dL — ABNORMAL HIGH (ref 0.4–1.2)
Glucose, Bld: 136 mg/dL — ABNORMAL HIGH (ref 70–99)

## 2011-08-01 NOTE — Assessment & Plan Note (Addendum)
Hypertrophic cardiomyopathy without significant LVOT obstruction on last echo.  She has significant diastolic CHF.  Recently, her torsemide was increased and she was told to take HCTZ every day with torsemide.  She has lost weight since then but does not feel better.  She actually looks close to euvolemic on exam.  Her BUN and creatinine have risen considerably.  I am going to decrease torsemide back to 100 mg daily (hold altogether tomorrow).  She will also take HCTZ only once a week.  This should be taken 30 minutes before torsemide to be most effective.  BMET in 1 week and followup in 1 month.

## 2011-08-01 NOTE — Progress Notes (Signed)
Patient ID: [name], female   DOB: 27-May-1930, 78 y.o.   MRN: [id number] 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  She reports increased fatigue and exertional dyspnea in recent months.  She has been short of breath just walking around her house for the last 2-3 months.  She continues to wear home oxygen.  No orthopnea or PND.  She saw Dr. [name] earlier this month and it appears that her torsemide was increased to 200 mg daily and she was started on HCTZ 25 mg daily to be taken with torsemide.   Since adjusting meds, her weight has dropped 8-9 lbs.  She does not, however, feel any better symptomatically.    Labs (2/12): BNP 521, K 4, creatinine 1.8 Labs (7/12): BNP 526 => 244, creatinine 1.9 => 2.2, K 4.1  Labs (8/12): BNP 410, K 3.3, creatinine 2.5 Labs (9/12): K 4.4, creatinine 2.2 Labs (10/12): K 3.6, creatinine 3.1 => 2.5 Labs (11/12): K 4.8, creatinine 2.1 Labs (4/13): BNP 312 Labs (6/13): K 4.4, creatinine 1.88 => 2.9, BUN 50 => 79  ECG: Atrial fibrillation with inferior and anterolateral TWIs  PMH: 1. St.Jude PCM  2. Chronic atrial fibrillation: on warfarin. 3. H/o CVA 4. Depression 5. CKD 6. DM 7. HTN 8. Hyperlipidemia 9. Hypertrophic cardiomyopathy with diastolic CHF: Echo (9/10) with EDF 55-60%, severe asymptomatic septal hypertrophy, no significant LV outflow tract gradient, mild MR, PA systolic pressure 53 mmHg.  Echo (5/13) with EF 50%, focal basal septal hypertrophy, no SAM, posterior hypokinesis, mild-moderate MR, massive LAE, RV mildly dilated with mildly decreased systolic function, PASP 50 mmHg. 10.  Nonobstructive CAD: 6/07 cath with luminal irregularities.   SH: Married, has 7 children.  Never smoked.  Lives in [place].  FH: CAD, CVA  ROS: All systems reviewed and negative except as per HPI.   Current Outpatient Prescriptions    Medication Sig Dispense Refill  . acetaminophen (TYLENOL) 325 MG tablet One to two tablets three times daily as needed      . benazepril (LOTENSIN) 20 MG tablet TAKE ONE TABLET BY MOUTH EVERY DAY  90 tablet  3  . carvedilol (COREG) 12.5 MG tablet TAKE ONE TABLET BY MOUTH TWICE DAILY  60 tablet  12  . diltiazem (CARTIA XT) 240 MG 24 hr capsule Take 1 capsule (240 mg total) by mouth daily.  30 capsule  11  . docusate sodium (COLACE) 100 MG capsule Take 100 mg by mouth 2 (two) times daily as needed.        . ferrous sulfate 325 (65 FE) MG tablet Take 325 mg by mouth daily.        Marland Kitchen glimepiride (AMARYL) 4 MG tablet TAKE ONE TABLET BY MOUTH EVERY DAY  30 tablet  12  . NON FORMULARY Oxygen; 2 L, Dx 428.22 86% on RA       . omeprazole (PRILOSEC) 20 MG capsule Take 1 capsule (20 mg total) by mouth daily.  30 capsule  12  . polyethylene glycol (MIRALAX / GLYCOLAX) packet Take 17 g by mouth daily.        . potassium chloride SA (K-DUR,KLOR-CON) 20 MEQ tablet Take 1 tablet (20 mEq total) by mouth daily.  90 tablet  3  . simvastatin (ZOCOR) 20 MG tablet TAKE ONE TABLET BY MOUTH AT BEDTIME  30 tablet  12  . warfarin (COUMADIN) 2 MG tablet TAKE  TWO TABLETS BY MOUTH THREE TIMES  A WEEK, THEN ONE TABLET 4 TIMES A WEEK. DOSE VARIES. MUST GET LABS CHECKED  60 tablet  6  . DISCONTD: hydrochlorothiazide (HYDRODIURIL) 25 MG tablet Take 1 tablet (25 mg total) by mouth daily.  90 tablet  3  . hydrochlorothiazide (HYDRODIURIL) 25 MG tablet Take on Wednesdays in the morning 30 minutes before you take torsemide      . torsemide (DEMADEX) 100 MG tablet Take 2 tablets daily at the same time in the morning        BP 124/70  Pulse 71  Ht 5\' 5"  (1.651 m)  Wt 78.926 kg (174 lb)  BMI 28.96 kg/m2 General: NAD Neck: JVP 7 cm, no thyromegaly or thyroid nodule.  Lungs: Slight crackles at bases bilaterally.   CV: Nondisplaced PMI.  Heart irregular S1/S2, no S3/S4, 1/6 systolic murmur along the sternal border. Trace bilateral  ankle edema. No carotid bruit.   Abdomen: Soft, nontender, no hepatosplenomegaly, no distention.  Neurologic: Alert and oriented x 3.  Psych: Depressed affect. Extremities: No clubbing or cyanosis.

## 2011-08-01 NOTE — Patient Instructions (Addendum)
Take HCTZ(hydrochlorothiazide) on Wednesdays in the morning 30 minutes before you take torsemide.  Your physician recommends that you have lab work today--BMET/BNP 428.33  427.31  Your physician recommends that you schedule a follow-up appointment in: 1 month with Dr Shirlee Latch.

## 2011-08-01 NOTE — Assessment & Plan Note (Signed)
Chronic atrial fibrillation.  Continue warfarin, diltiazem CD, Coreg.  Good rate control. No falls.  

## 2011-08-01 NOTE — Assessment & Plan Note (Signed)
Significant rise in BUN/creatinine since increase in torsemide and daily thiazide use.  Would try to avoid daily thiazide use in future, will use once a week for now.

## 2011-08-02 ENCOUNTER — Other Ambulatory Visit: Payer: Self-pay | Admitting: *Deleted

## 2011-08-02 DIAGNOSIS — N184 Chronic kidney disease, stage 4 (severe): Secondary | ICD-10-CM

## 2011-08-02 DIAGNOSIS — I5032 Chronic diastolic (congestive) heart failure: Secondary | ICD-10-CM

## 2011-08-13 ENCOUNTER — Other Ambulatory Visit (INDEPENDENT_AMBULATORY_CARE_PROVIDER_SITE_OTHER): Payer: Medicare Other

## 2011-08-13 DIAGNOSIS — N184 Chronic kidney disease, stage 4 (severe): Secondary | ICD-10-CM

## 2011-08-13 DIAGNOSIS — I5032 Chronic diastolic (congestive) heart failure: Secondary | ICD-10-CM

## 2011-08-13 LAB — BASIC METABOLIC PANEL
CO2: 24 mEq/L (ref 19–32)
Calcium: 9.5 mg/dL (ref 8.4–10.5)
Creatinine, Ser: 2.3 mg/dL — ABNORMAL HIGH (ref 0.4–1.2)
GFR: 21.96 mL/min — ABNORMAL LOW (ref >60.00)
Sodium: 142 mEq/L (ref 135–145)

## 2011-08-14 ENCOUNTER — Other Ambulatory Visit: Payer: Self-pay | Admitting: *Deleted

## 2011-08-14 ENCOUNTER — Other Ambulatory Visit: Payer: Self-pay | Admitting: Family Medicine

## 2011-08-14 DIAGNOSIS — N184 Chronic kidney disease, stage 4 (severe): Secondary | ICD-10-CM

## 2011-08-14 NOTE — Telephone Encounter (Signed)
Will forward to Dr. Deirdre Priest.  Not sure if Dr. [name]  prescribes or Dr. Shirlee Latch.

## 2011-09-07 ENCOUNTER — Ambulatory Visit (INDEPENDENT_AMBULATORY_CARE_PROVIDER_SITE_OTHER): Payer: Medicare Other | Admitting: Cardiology

## 2011-09-07 ENCOUNTER — Other Ambulatory Visit (INDEPENDENT_AMBULATORY_CARE_PROVIDER_SITE_OTHER): Payer: Medicare Other

## 2011-09-07 ENCOUNTER — Encounter: Payer: Self-pay | Admitting: Cardiology

## 2011-09-07 VITALS — BP 114/68 | HR 76 | Ht 65.0 in | Wt 175.0 lb

## 2011-09-07 DIAGNOSIS — N184 Chronic kidney disease, stage 4 (severe): Secondary | ICD-10-CM

## 2011-09-07 DIAGNOSIS — I4891 Unspecified atrial fibrillation: Secondary | ICD-10-CM

## 2011-09-07 DIAGNOSIS — I5032 Chronic diastolic (congestive) heart failure: Secondary | ICD-10-CM

## 2011-09-07 DIAGNOSIS — I509 Heart failure, unspecified: Secondary | ICD-10-CM

## 2011-09-07 LAB — BASIC METABOLIC PANEL
BUN: 51 mg/dL — ABNORMAL HIGH (ref 6–23)
Chloride: 100 mEq/L (ref 96–112)
Creatinine, Ser: 2.3 mg/dL — ABNORMAL HIGH (ref 0.4–1.2)
GFR: 21.41 mL/min — ABNORMAL LOW (ref >60.00)
Potassium: 4.2 mEq/L (ref 3.5–5.1)

## 2011-09-07 MED ORDER — BENAZEPRIL HCL 10 MG PO TABS: 10.0000 mg | ORAL_TABLET | Freq: Every day | ORAL | Status: DC

## 2011-09-07 NOTE — Patient Instructions (Addendum)
Decrease benazepril to 10mg  daily. You can take one-half 20mg  tablet daily and use your current supply.   Your physician recommends that you have lab today--BMET/BNP. Dr Shirlee Latch will check a pro- time today and send the result to Dr [name]. You should expect a call from his office about your coumadin dose.  Your physician recommends that you schedule a follow-up appointment in: 2 months with Dr Shirlee Latch.

## 2011-09-10 NOTE — Assessment & Plan Note (Signed)
Hypertrophic cardiomyopathy without significant LVOT obstruction on last echo.  She has significant diastolic CHF.  Volume status appears to be reasonable today on current diuretic regimen and creatinine has improved.  I will continue her current diuretics.  BMET/BNP today.  Followup in 2 months.

## 2011-09-10 NOTE — Progress Notes (Signed)
Patient ID: [name], female   DOB: 1930-02-21, 79 y.o.   MRN: [id number] 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  Breathing has been stable.  She has been doing fairly well in symptomatically.  She is able to get around her house without significant dyspnea though she is short of breath walking longer distances.  She continues to wear home oxygen.  No orthopnea or PND.  Weight is stable.  She did have a fall about 3 weeks ago in which she tripped over her oxygen line.  She still has bad bruising on her face.  She did not go to the hospital at the time of the accident.  She does not have headaches or any significant neurological symptoms.     Labs (2/12): BNP 521, K 4, creatinine 1.8 Labs (7/12): BNP 526 => 244, creatinine 1.9 => 2.2, K 4.1  Labs (8/12): BNP 410, K 3.3, creatinine 2.5 Labs (9/12): K 4.4, creatinine 2.2 Labs (10/12): K 3.6, creatinine 3.1 => 2.5 Labs (11/12): K 4.8, creatinine 2.1 Labs (4/13): BNP 312 Labs (6/13): K 4.4, creatinine 1.88 => 2.9, BUN 50 => 79 Labs (7/13): K 4.9, creatinine 2.3  PMH: 1. St.Jude PCM  2. Chronic atrial fibrillation: on warfarin. 3. H/o CVA 4. Depression 5. CKD 6. DM 7. HTN 8. Hyperlipidemia 9. Hypertrophic cardiomyopathy with diastolic CHF: Echo (9/10) with EDF 55-60%, severe asymptomatic septal hypertrophy, no significant LV outflow tract gradient, mild MR, PA systolic pressure 53 mmHg.  Echo (5/13) with EF 50%, focal basal septal hypertrophy, no SAM, posterior hypokinesis, mild-moderate MR, massive LAE, RV mildly dilated with mildly decreased systolic function, PASP 50 mmHg. 10.  Nonobstructive CAD: 6/07 cath with luminal irregularities.   SH: Married, has 7 children.  Never smoked.  Lives in [place].  FH: CAD, CVA  ROS: All systems reviewed and negative except as per HPI.   Current Outpatient Prescriptions   Medication Sig Dispense Refill  . acetaminophen (TYLENOL) 325 MG tablet One to two tablets three times daily as needed      . carvedilol (COREG) 12.5 MG tablet TAKE ONE TABLET BY MOUTH TWICE DAILY  60 tablet  12  . diltiazem (CARTIA XT) 240 MG 24 hr capsule Take 1 capsule (240 mg total) by mouth daily.  30 capsule  11  . docusate sodium (COLACE) 100 MG capsule Take 100 mg by mouth 2 (two) times daily as needed.        . ferrous sulfate 325 (65 FE) MG tablet Take 325 mg by mouth daily.        Marland Kitchen glimepiride (AMARYL) 4 MG tablet TAKE ONE TABLET BY MOUTH EVERY DAY  30 tablet  12  . hydrochlorothiazide (HYDRODIURIL) 25 MG tablet Take on Wednesdays in the morning 30 minutes before you take torsemide      . NON FORMULARY Oxygen; 2 L, Dx 428.22 86% on RA       . omeprazole (PRILOSEC) 20 MG capsule Take 1 capsule (20 mg total) by mouth daily.  30 capsule  12  . polyethylene glycol (MIRALAX / GLYCOLAX) packet Take 17 g by mouth daily.        . potassium chloride SA (K-DUR,KLOR-CON) 20 MEQ tablet Take 1 tablet (20 mEq total) by mouth daily.  90 tablet  3  . simvastatin (ZOCOR) 20 MG tablet TAKE ONE TABLET BY MOUTH AT BEDTIME  30 tablet  12  . torsemide (DEMADEX) 100 MG tablet TAKE ONE TABLET BY MOUTH EVERY DAY  30 tablet  1  . warfarin (COUMADIN) 2 MG tablet TAKE TWO TABLETS BY MOUTH THREE TIMES  A WEEK, THEN ONE TABLET 4 TIMES A WEEK. DOSE VARIES. MUST GET LABS CHECKED  60 tablet  6  . benazepril (LOTENSIN) 10 MG tablet Take 1 tablet (10 mg total) by mouth daily.  30 tablet  6    BP 114/68  Pulse 76  Ht 5\' 5"  (1.651 m)  Wt 175 lb (79.379 kg)  BMI 29.12 kg/m2  SpO2 94% General: NAD Neck: JVP 8 cm, no thyromegaly or thyroid nodule.  Lungs: Slight crackles at bases bilaterally.   CV: Nondisplaced PMI.  Heart irregular S1/S2, no S3/S4, 1/6 systolic murmur along the sternal border. Trace bilateral ankle edema. No carotid bruit.   Abdomen: Soft, nontender, no hepatosplenomegaly, no distention.    Neurologic: Alert and oriented x 3.  Psych: Depressed affect. Extremities: No clubbing or cyanosis.  Skin: Facial bruising, healing

## 2011-09-10 NOTE — Assessment & Plan Note (Signed)
Chronic atrial fibrillation.  Continue warfarin, diltiazem CD, Coreg.  Good rate control. She has had 1 fall recently.  It was a mechanical fall where she tripped over her oxygen cord.  In general, she is steady on her feet.  I did ask her to seek medical care if she has a fall again where she hits her head. In that case, we may need to re-think her anticoagulation.

## 2011-09-10 NOTE — Assessment & Plan Note (Signed)
Given renal dysfunction and well-controlled BP, I am going to cut back on benazepril to 10 mg daily.

## 2011-10-29 DIAGNOSIS — I495 Sick sinus syndrome: Secondary | ICD-10-CM

## 2011-10-31 ENCOUNTER — Ambulatory Visit: Payer: Medicare Other | Admitting: Family Medicine

## 2011-11-01 ENCOUNTER — Inpatient Hospital Stay (HOSPITAL_COMMUNITY)
Admission: AD | Admit: 2011-11-01 | Discharge: 2011-11-07 | DRG: 378 | Disposition: A | Discharge status: Skilled Nursing Facility | Payer: Medicare Other | Source: Ambulatory Visit | Attending: Family Medicine | Admitting: Family Medicine

## 2011-11-01 ENCOUNTER — Encounter: Payer: Self-pay | Admitting: Family Medicine

## 2011-11-01 ENCOUNTER — Ambulatory Visit (INDEPENDENT_AMBULATORY_CARE_PROVIDER_SITE_OTHER): Payer: Medicare Other | Admitting: Family Medicine

## 2011-11-01 ENCOUNTER — Ambulatory Visit: Payer: Medicare Other | Admitting: Cardiology

## 2011-11-01 ENCOUNTER — Ambulatory Visit (INDEPENDENT_AMBULATORY_CARE_PROVIDER_SITE_OTHER): Payer: Medicare Other | Admitting: *Deleted

## 2011-11-01 ENCOUNTER — Inpatient Hospital Stay (HOSPITAL_COMMUNITY): Payer: Medicare Other

## 2011-11-01 VITALS — BP 132/61 | HR 93 | Temp 98.2°F | Wt 171.0 lb

## 2011-11-01 DIAGNOSIS — R1310 Dysphagia, unspecified: Secondary | ICD-10-CM | POA: Diagnosis present

## 2011-11-01 DIAGNOSIS — Z950 Presence of cardiac pacemaker: Secondary | ICD-10-CM

## 2011-11-01 DIAGNOSIS — Z833 Family history of diabetes mellitus: Secondary | ICD-10-CM

## 2011-11-01 DIAGNOSIS — E785 Hyperlipidemia, unspecified: Secondary | ICD-10-CM | POA: Diagnosis present

## 2011-11-01 DIAGNOSIS — Z79899 Other long term (current) drug therapy: Secondary | ICD-10-CM

## 2011-11-01 DIAGNOSIS — R0789 Other chest pain: Secondary | ICD-10-CM | POA: Diagnosis not present

## 2011-11-01 DIAGNOSIS — D500 Iron deficiency anemia secondary to blood loss (chronic): Secondary | ICD-10-CM | POA: Diagnosis present

## 2011-11-01 DIAGNOSIS — D689 Coagulation defect, unspecified: Secondary | ICD-10-CM

## 2011-11-01 DIAGNOSIS — Y92009 Unspecified place in unspecified non-institutional (private) residence as the place of occurrence of the external cause: Secondary | ICD-10-CM

## 2011-11-01 DIAGNOSIS — I4891 Unspecified atrial fibrillation: Secondary | ICD-10-CM

## 2011-11-01 DIAGNOSIS — D6489 Other specified anemias: Secondary | ICD-10-CM

## 2011-11-01 DIAGNOSIS — I2789 Other specified pulmonary heart diseases: Secondary | ICD-10-CM | POA: Diagnosis present

## 2011-11-01 DIAGNOSIS — I2510 Atherosclerotic heart disease of native coronary artery without angina pectoris: Secondary | ICD-10-CM

## 2011-11-01 DIAGNOSIS — R791 Abnormal coagulation profile: Secondary | ICD-10-CM

## 2011-11-01 DIAGNOSIS — K219 Gastro-esophageal reflux disease without esophagitis: Secondary | ICD-10-CM | POA: Diagnosis present

## 2011-11-01 DIAGNOSIS — N179 Acute kidney failure, unspecified: Secondary | ICD-10-CM | POA: Diagnosis not present

## 2011-11-01 DIAGNOSIS — T3995XA Adverse effect of unspecified nonopioid analgesic, antipyretic and antirheumatic, initial encounter: Secondary | ICD-10-CM | POA: Diagnosis present

## 2011-11-01 DIAGNOSIS — Z823 Family history of stroke: Secondary | ICD-10-CM

## 2011-11-01 DIAGNOSIS — I428 Other cardiomyopathies: Secondary | ICD-10-CM | POA: Diagnosis present

## 2011-11-01 DIAGNOSIS — N184 Chronic kidney disease, stage 4 (severe): Secondary | ICD-10-CM

## 2011-11-01 DIAGNOSIS — Z8249 Family history of ischemic heart disease and other diseases of the circulatory system: Secondary | ICD-10-CM

## 2011-11-01 DIAGNOSIS — E876 Hypokalemia: Secondary | ICD-10-CM | POA: Diagnosis not present

## 2011-11-01 DIAGNOSIS — E1142 Type 2 diabetes mellitus with diabetic polyneuropathy: Secondary | ICD-10-CM | POA: Diagnosis present

## 2011-11-01 DIAGNOSIS — N2581 Secondary hyperparathyroidism of renal origin: Secondary | ICD-10-CM | POA: Diagnosis present

## 2011-11-01 DIAGNOSIS — K625 Hemorrhage of anus and rectum: Secondary | ICD-10-CM

## 2011-11-01 DIAGNOSIS — D62 Acute posthemorrhagic anemia: Secondary | ICD-10-CM

## 2011-11-01 DIAGNOSIS — Z7901 Long term (current) use of anticoagulants: Secondary | ICD-10-CM

## 2011-11-01 DIAGNOSIS — E7800 Pure hypercholesterolemia, unspecified: Secondary | ICD-10-CM | POA: Diagnosis present

## 2011-11-01 DIAGNOSIS — T45515A Adverse effect of anticoagulants, initial encounter: Secondary | ICD-10-CM | POA: Diagnosis present

## 2011-11-01 DIAGNOSIS — Z800 Family history of malignant neoplasm of digestive organs: Secondary | ICD-10-CM

## 2011-11-01 DIAGNOSIS — E1165 Type 2 diabetes mellitus with hyperglycemia: Secondary | ICD-10-CM

## 2011-11-01 DIAGNOSIS — K921 Melena: Secondary | ICD-10-CM

## 2011-11-01 DIAGNOSIS — F329 Major depressive disorder, single episode, unspecified: Secondary | ICD-10-CM | POA: Diagnosis present

## 2011-11-01 DIAGNOSIS — F3289 Other specified depressive episodes: Secondary | ICD-10-CM | POA: Diagnosis present

## 2011-11-01 DIAGNOSIS — I129 Hypertensive chronic kidney disease with stage 1 through stage 4 chronic kidney disease, or unspecified chronic kidney disease: Secondary | ICD-10-CM | POA: Diagnosis present

## 2011-11-01 DIAGNOSIS — I498 Other specified cardiac arrhythmias: Secondary | ICD-10-CM

## 2011-11-01 DIAGNOSIS — R195 Other fecal abnormalities: Secondary | ICD-10-CM

## 2011-11-01 DIAGNOSIS — K922 Gastrointestinal hemorrhage, unspecified: Secondary | ICD-10-CM

## 2011-11-01 DIAGNOSIS — Z8673 Personal history of transient ischemic attack (TIA), and cerebral infarction without residual deficits: Secondary | ICD-10-CM

## 2011-11-01 DIAGNOSIS — E781 Pure hyperglyceridemia: Secondary | ICD-10-CM | POA: Diagnosis present

## 2011-11-01 DIAGNOSIS — Z23 Encounter for immunization: Secondary | ICD-10-CM

## 2011-11-01 DIAGNOSIS — I252 Old myocardial infarction: Secondary | ICD-10-CM

## 2011-11-01 DIAGNOSIS — K254 Chronic or unspecified gastric ulcer with hemorrhage: Principal | ICD-10-CM | POA: Diagnosis present

## 2011-11-01 DIAGNOSIS — I5032 Chronic diastolic (congestive) heart failure: Secondary | ICD-10-CM

## 2011-11-01 DIAGNOSIS — K259 Gastric ulcer, unspecified as acute or chronic, without hemorrhage or perforation: Secondary | ICD-10-CM

## 2011-11-01 DIAGNOSIS — E118 Type 2 diabetes mellitus with unspecified complications: Secondary | ICD-10-CM

## 2011-11-01 DIAGNOSIS — E1149 Type 2 diabetes mellitus with other diabetic neurological complication: Secondary | ICD-10-CM | POA: Diagnosis present

## 2011-11-01 HISTORY — DX: Presence of cardiac pacemaker: Z95.0

## 2011-11-01 HISTORY — DX: Cerebral infarction, unspecified: I63.9

## 2011-11-01 LAB — POCT HEMOGLOBIN: Hemoglobin: 8.5 g/dL — AB (ref 12.2–16.2)

## 2011-11-01 LAB — POCT GLYCOSYLATED HEMOGLOBIN (HGB A1C): Hemoglobin A1C: 7.1

## 2011-11-01 LAB — APTT: aPTT: 151 seconds — ABNORMAL HIGH (ref 24–37)

## 2011-11-01 LAB — PREPARE RBC (CROSSMATCH): Order Confirmation: POSITIVE

## 2011-11-01 LAB — CBC
HCT: 22.3 % — ABNORMAL LOW (ref 36.0–46.0)
HCT: 20.1 % — ABNORMAL LOW (ref 36.0–46.0)
Hemoglobin: 7.2 g/dL — ABNORMAL LOW (ref 12.0–15.0)
MCH: 30.8 pg (ref 26.0–34.0)
Platelets: 138 10*3/uL — ABNORMAL LOW (ref 150–400)
Platelets: 152 10*3/uL (ref 150–400)
RBC: 2.34 MIL/uL — ABNORMAL LOW (ref 3.87–5.11)
RDW: 15.9 % — ABNORMAL HIGH (ref 11.5–15.5)
RDW: 16 % — ABNORMAL HIGH (ref 11.5–15.5)
WBC: 6.1 10*3/uL (ref 4.0–10.5)
WBC: 5.9 10*3/uL (ref 4.0–10.5)
WBC: 6.8 10*3/uL (ref 4.0–10.5)

## 2011-11-01 LAB — COMPREHENSIVE METABOLIC PANEL
ALT: 13 U/L (ref 0–35)
AST: 13 U/L (ref 0–37)
Albumin: 2.9 g/dL — ABNORMAL LOW (ref 3.5–5.2)
Alkaline Phosphatase: 53 U/L (ref 39–117)
Chloride: 101 mEq/L (ref 96–112)
Potassium: 3.6 mEq/L (ref 3.5–5.1)
Sodium: 140 mEq/L (ref 135–145)
Total Bilirubin: 0.2 mg/dL — ABNORMAL LOW (ref 0.3–1.2)

## 2011-11-01 LAB — GLUCOSE, CAPILLARY
Glucose-Capillary: 119 mg/dL — ABNORMAL HIGH (ref 70–99)
Glucose-Capillary: 177 mg/dL — ABNORMAL HIGH (ref 70–99)
Glucose-Capillary: 206 mg/dL — ABNORMAL HIGH (ref 70–99)

## 2011-11-01 MED ORDER — ONDANSETRON HCL 4 MG/2ML IJ SOLN
4.0000 mg | Freq: Once | INTRAMUSCULAR | Status: AC
  Administered 2011-11-01: 4 mg via INTRAVENOUS
  Filled 2011-11-01: qty 2

## 2011-11-01 MED ORDER — SODIUM CHLORIDE 0.9 % IV SOLN: INTRAVENOUS | Status: DC

## 2011-11-01 MED ORDER — ACETAMINOPHEN 650 MG RE SUPP: 650.0000 mg | Freq: Four times a day (QID) | RECTAL | Status: DC | PRN

## 2011-11-01 MED ORDER — VITAMIN K1 10 MG/ML IJ SOLN
10.0000 mg | Freq: Once | INTRAVENOUS | Status: AC
  Administered 2011-11-01: 10 mg via INTRAVENOUS
  Filled 2011-11-01: qty 1

## 2011-11-01 MED ORDER — SODIUM CHLORIDE 0.9 % IJ SOLN: 3.0000 mL | Freq: Two times a day (BID) | INTRAMUSCULAR | Status: DC

## 2011-11-01 MED ORDER — PHYTONADIONE 5 MG PO TABS
2.5000 mg | ORAL_TABLET | Freq: Once | ORAL | Status: AC
  Administered 2011-11-01: 2.5 mg via ORAL
  Filled 2011-11-01 (×2): qty 1

## 2011-11-01 MED ORDER — SODIUM CHLORIDE 0.9 % IV SOLN: 250.0000 mL | INTRAVENOUS | Status: DC | PRN

## 2011-11-01 MED ORDER — SODIUM CHLORIDE 0.9 % IJ SOLN: 3.0000 mL | INTRAMUSCULAR | Status: DC | PRN

## 2011-11-01 MED ORDER — PANTOPRAZOLE SODIUM 40 MG IV SOLR
40.0000 mg | Freq: Two times a day (BID) | INTRAVENOUS | Status: DC
  Administered 2011-11-01 – 2011-11-02 (×2): 40 mg via INTRAVENOUS
  Filled 2011-11-01 (×4): qty 40

## 2011-11-01 MED ORDER — ACETAMINOPHEN 325 MG PO TABS
650.0000 mg | ORAL_TABLET | Freq: Four times a day (QID) | ORAL | Status: DC | PRN
  Administered 2011-11-01 – 2011-11-07 (×2): 650 mg via ORAL
  Filled 2011-11-01 (×2): qty 2

## 2011-11-01 MED ORDER — SODIUM CHLORIDE 0.9 % IJ SOLN
10.0000 mL | INTRAMUSCULAR | Status: DC | PRN
  Administered 2011-11-06 (×2): 10 mL
  Filled 2011-11-01: qty 10

## 2011-11-01 MED ORDER — SODIUM CHLORIDE 0.9 % IV SOLN
INTRAVENOUS | Status: DC
  Administered 2011-11-01: 500 mL via INTRAVENOUS

## 2011-11-01 MED ORDER — SODIUM CHLORIDE 0.9 % IJ SOLN
10.0000 mL | Freq: Two times a day (BID) | INTRAMUSCULAR | Status: DC
  Administered 2011-11-01: 10 mL
  Administered 2011-11-02: 30 mL
  Administered 2011-11-03: 20 mL
  Administered 2011-11-03 – 2011-11-04 (×2): 10 mL
  Administered 2011-11-04 – 2011-11-05 (×2): 20 mL
  Filled 2011-11-01 (×4): qty 20
  Filled 2011-11-01 (×2): qty 10
  Filled 2011-11-01: qty 20
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
## 2011-11-01 NOTE — Progress Notes (Signed)
CRITICAL VALUE ALERT  Critical value received: INR 9.51  Date of notification:  11/01/11  Time of notification:  1304  Critical value read back:yes  Nurse who received alert:  Mervin Hack rn  MD notified (1st page):  Dr Birdie Sons  Time of first page:  1304  MD notified (2nd page):  Time of second page:  Responding MD:  Dr Birdie Sons  Time MD responded:  [phone]

## 2011-11-01 NOTE — Procedures (Signed)
R IJ TL Catheter under fluoro No complication No blood loss. See complete dictation in [facility].

## 2011-11-01 NOTE — H&P (Signed)
Family Medicine Teaching [facility] Admission History and Physical Service Pager: [phone]  Patient name: [name] Medical record number: [id number] Date of birth: 1930/02/16 Age: 78 y.o. Gender: female  Primary Care Provider: Sanjuana Letters, MD  Chief Complaint: bleeding per rectum  Assessment and Plan: [name] is a 78 y.o. year old female with a history of DMII, HLD, HTN, MI, afib on coumadin, and CKD IV who presents with a 3-4 week history of rectal bleeding and recent increase in bruising.  1. Rectal bleeding: patient with history of diverticulosis.  Differential also includes PUD, angiodysplasia, AVM, neoplastic cause. INR supratherapeutic at 9.51, will need reversal of this. Hb of 7.6 at time of admission that dropped to 6.8. - IV team unsuccessful on IV placement.  - PICC line was not a option with pt renal function after consulting with nephrology. - Central line placement will be done by IR since CCM consulted will not attempt on a pt with INR of 9. - q4hr CBCs - typed and crossed - transfuse 2 units of blood - initially on telemetry after critical values transferred to step down status - GI consulted and recs reversal of INR, follow CBCs, and likely need EGD in near future - patient given vit K 2.5 mg PO, ordered for additional vit K 10 mg IV once has IV access - should consider transfusion and FFP if patient continues to bleed   2. Supratherapeutic INR: Coumadin held today and patient did not take it last night either. Reversal as above.  3. DM: glucose on admission 121.  Will consider SSI if glucose over 200. Held home glipizide.  4. HTN: BP currently 131/80. Given bleeding we will hold home BP medications with risk of hypotension.  5. Afib: patient with pacemaker in place. Pulse currently 99. Will restart rate control medications once bleeding is more stable.  6: CKD stage IV: GFR of 13 on admission. Creatinine 3.14. Pt's baseline around 2.3. Pt  CO2 and K wnl. Consulted with nephrology who will f/u.  6. FEN/GI: NPO.   7. Prophylaxis: SCDs, patient is actively bleeding no anticoagulation   8. Disposition: pending clinical improvement of bleeding and GI recs.   History of Present Illness: [name] is a 78 y.o. year old female with a history of DMII, HLD, HTN, MI, afib on coumadin, and CKD IV who presents with a 3-4 week history of rectal bleeding and recent increase in bruising.  Her bleeding has acutely worsened over the past few days.  She has been passing bright red blood and states that it has been leaking onto her bed and chair whenever she sits down. She has had to arise several times during the night to clean herself and her bed.  She goes to the bathroom 3-4 times a day and states she fills the toilet with blood.  She endorsed general malaise and fatigue worsening over the past couple of days.  She also endorses some light headedness. Endorses chest pain but states this only occurs when not wearing her oxygen, of which she is on 2L via [place] at home and states she isn't sure why she is on this. Denies palpitations and shortness of breath.  Denies abdominal pain.  She also endorses decreased PO intake which she believes is due to her malaise.  She was seen in clinic by Dr. Gwendolyn Grant to be evaluated for these issues and her INR was found to be greater than 8 and POCHb was 8.5.  At this  point Dr. Gwendolyn Grant admitted the patient to FPTS.  Since arriving to the floor the patient INR was found to be 9.51 with a Hb of 7.6.  She was given vit K 2.5 mg PO.  She continued to have bloody stools and most recently they were tarry.  IV team was unable to get IV access and patient is to get a PICC line.  She is also to get vit K 10 mg IV.  She has been typed and crossed as well.  Patient has no current complaints.   Patient Active Problem List  Diagnosis  . DIABETIC PERIPHERAL NEUROPATHY  . DIABETES MELLITUS, II, COMPLICATIONS  . HYPERCHOLESTEROLEMIA    . HYPERTRIGLYCERIDEMIA  . ANEMIA NEC  . DEPRESSIVE DISORDER, NOS  . MACULAR DEGENERATION, SENILE  . UNSPECIFIED HEARING LOSS  . HYPERTENSION, BENIGN SYSTEMIC  . MYOCARDIAL INFARCTION, OLD  . CORONARY, ARTERIOSCLEROSIS  . HOCM / IHSS  . ATRIAL FIBRILLATION  . CVA  . STASIS DERMATITIS  . DIVERTICULOSIS, COLON  . OSTEOARTHRITIS, MULTI SITES  . DEGENERATION, LUMBAR/LUMBOSACRAL DISC  . CERVICALGIA  . LEG CRAMPS  . SCOLIOSIS, LUMBAR SPINE  . SYNCOPE AND COLLAPSE  . INCONTINENCE/ENURESIS, NOS  . HX, PERSONAL, MALIGNANCY, SKIN MELANOMA  . PACEMAKER, PERMANENT  . Encounter for long-term (current) use of anticoagulants  . Encounter for end of life care  . Diastolic CHF, chronic  . Atrial fibrillation  . GERD (gastroesophageal reflux disease)  . Orthopnea  . Chronic kidney disease (CKD), stage IV (severe)   Past Medical History: Past Medical History  Diagnosis Date  . Cardiac pacemaker     Dr. Juanda Chance; St. Jude VVI  . Macular hole of left eye     told will lose sight  . NICM (nonischemic cardiomyopathy)   . Atrial fibrillation     off anticoagulation presumed to anemia  . Coronary atherosclerosis     and old MI and hx of CVA   . Depression   . Dysphagia   . DM (diabetes mellitus)     nueropathy  . CRI (chronic renal insufficiency)     cr basleine 1.5-1.6  . HTN (hypertension)   . HLD (hyperlipidemia)   . Incontinence   . CHF (congestive heart failure)     related to dyastolic dysfunction  . CAD (coronary artery disease)     nonobstructive  . Osteoarthritis   . Scoliosis   . Repeated falls     hx  . Stasis dermatitis   . Hearing loss   . Anemia   . CHF (congestive heart failure)     cardiacc ath EF 25-30%   Past Surgical History: Past Surgical History  Procedure Date  . Bliateral foot surgery 08/19/01  . Bladder tack 08/19/01  . Hysterectomy and bso 08/19/01  . Melanoma excision 08/19/01  . Pacemaker placement     x2   Social History: History  Substance Use  Topics  . Smoking status: Never Smoker   . Smokeless tobacco: Not on file   Comment: non smoker   . Alcohol Use: No   For any additional social history documentation, please refer to relevant sections of EMR.  Family History: Family History  Problem Relation Age of Onset  . Heart attack      family hx  . Colon cancer      family hx  . Stroke      family hx  . Diabetes      DM - family hx    Allergies: Allergies  Allergen Reactions  . Codeine Phosphate     REACTION: rash  . Hydrocodone     REACTION: nausea and pruritis  . Paroxetine     REACTION: hallucinations and falls  . Tramadol Hcl     REACTION: rash and itching   Current Facility-Administered Medications on File Prior to Encounter  Medication Dose Route Frequency Provider Last Rate Last Dose  . DISCONTD: 0.9 %  sodium chloride infusion   Intravenous Continuous Tobey Grim, MD 20 mL/hr at 11/01/11 1023 500 mL at 11/01/11 1023   Current Outpatient Prescriptions on File Prior to Encounter  Medication Sig Dispense Refill  . benazepril (LOTENSIN) 10 MG tablet Take 1 tablet (10 mg total) by mouth daily.  30 tablet  6  . diltiazem (CARTIA XT) 240 MG 24 hr capsule Take 1 capsule (240 mg total) by mouth daily.  30 capsule  11  . docusate sodium (COLACE) 100 MG capsule Take 100 mg by mouth 2 (two) times daily as needed.        . ferrous sulfate 325 (65 FE) MG tablet Take 325 mg by mouth daily.        . hydrochlorothiazide (HYDRODIURIL) 25 MG tablet Take on Wednesdays in the morning 30 minutes before you take torsemide      . omeprazole (PRILOSEC) 20 MG capsule Take 1 capsule (20 mg total) by mouth daily.  30 capsule  12  . polyethylene glycol (MIRALAX / GLYCOLAX) packet Take 17 g by mouth daily.        . potassium chloride SA (K-DUR,KLOR-CON) 20 MEQ tablet Take 1 tablet (20 mEq total) by mouth daily.  90 tablet  3  . warfarin (COUMADIN) 2 MG tablet TAKE TWO TABLETS BY MOUTH THREE TIMES  A WEEK, THEN ONE TABLET 4 TIMES A  WEEK. DOSE VARIES. MUST GET LABS CHECKED  60 tablet  6  . NON FORMULARY Oxygen; 2 L, Dx 428.22 86% on RA        Review Of Systems: Per HPI with the following additions: none Otherwise 12 point review of systems was performed and was unremarkable.  Physical Exam: BP 131/80  Pulse 99  Temp 98.1 F (36.7 C) (Oral)  Resp 20  SpO2 97% Exam: General: NAD, resting comfortably in bed HEENT: [name], AT, pale conjunctiva Cardiovascular: irregularly irregular, no murmurs, rubs, or gallops Respiratory: CTAB, no wheezes or crackles Abdomen: soft, NT, ND, no masses Extremities: no edema, tenderness Skin: ecchymoses evident over the patients legs and arms Neuro: grossly normal  Labs and Imaging: CBC BMET   Lab 11/01/11 1151  WBC 6.1  HGB 7.6*  HCT 22.3*  PLT 138*    Lab 11/01/11 1151  NA 140  K 3.6  CL 101  CO2 25  BUN 124*  CREATININE 3.14*  GLUCOSE 121*  CALCIUM 9.3     Results for orders placed during the hospital encounter of 11/01/11 (from the past 24 hour(s))  GLUCOSE, CAPILLARY     Status: Abnormal   Collection Time   11/01/11 11:18 AM      Component Value Range   Glucose-Capillary 119 (*) 70 - 99 mg/dL  APTT     Status: Abnormal   Collection Time   11/01/11 11:51 AM      Component Value Range   aPTT 151 (*) 24 - 37 seconds  CBC     Status: Abnormal   Collection Time   11/01/11 11:51 AM      Component Value Range   WBC  6.1  4.0 - 10.5 K/uL   RBC 2.45 (*) 3.87 - 5.11 MIL/uL   Hemoglobin 7.6 (*) 12.0 - 15.0 g/dL   HCT 08.6 (*) 57.8 - 46.9 %   MCV 91.0  78.0 - 100.0 fL   MCH 31.0  26.0 - 34.0 pg   MCHC 34.1  30.0 - 36.0 g/dL   RDW 62.9 (*) 52.8 - 41.3 %   Platelets 138 (*) 150 - 400 K/uL  COMPREHENSIVE METABOLIC PANEL     Status: Abnormal   Collection Time   11/01/11 11:51 AM      Component Value Range   Sodium 140  135 - 145 mEq/L   Potassium 3.6  3.5 - 5.1 mEq/L   Chloride 101  96 - 112 mEq/L   CO2 25  19 - 32 mEq/L   Glucose, Bld 121 (*) 70 - 99 mg/dL   BUN  244 (*) 6 - 23 mg/dL   Creatinine, Ser 0.10 (*) 0.50 - 1.10 mg/dL   Calcium 9.3  8.4 - 27.2 mg/dL   Total Protein 6.7  6.0 - 8.3 g/dL   Albumin 2.9 (*) 3.5 - 5.2 g/dL   AST 13  0 - 37 U/L   ALT 13  0 - 35 U/L   Alkaline Phosphatase 53  39 - 117 U/L   Total Bilirubin 0.2 (*) 0.3 - 1.2 mg/dL   GFR calc non Af Amer 13 (*) >90 mL/min   GFR calc Af Amer 15 (*) >90 mL/min  PRO B NATRIURETIC PEPTIDE     Status: Abnormal   Collection Time   11/01/11 11:51 AM      Component Value Range   Pro B Natriuretic peptide (BNP) 8503.0 (*) 0 - 450 pg/mL  PROTIME-INR     Status: Abnormal   Collection Time   11/01/11 11:51 AM      Component Value Range   Prothrombin Time 69.5 (*) 11.6 - 15.2 seconds   INR 9.51 (*) 0.00 - 1.49  OCCULT BLOOD X 1 CARD TO LAB, STOOL     Status: Normal   Collection Time   11/01/11  2:01 PM      Component Value Range   Fecal Occult Bld POSITIVE    TYPE AND SCREEN     Status: Normal (Preliminary result)   Collection Time   11/01/11  3:00 PM      Component Value Range   ABO/RH(D) O NEG     Antibody Screen NEG     Sample Expiration 11/04/2011     Unit Number [id number]     Blood Component Type RED CELLS,LR     Unit division 00     Status of Unit ISSUED     Transfusion Status OK TO TRANSFUSE     Crossmatch Result Compatible     Unit Number [id number]     Blood Component Type RBC LR PHER1     Unit division 00     Status of Unit ALLOCATED     Transfusion Status OK TO TRANSFUSE     Crossmatch Result Compatible    CBC     Status: Abnormal   Collection Time   11/01/11  4:26 PM      Component Value Range   WBC 5.9  4.0 - 10.5 K/uL   RBC 2.19 (*) 3.87 - 5.11 MIL/uL   Hemoglobin 6.8 (*) 12.0 - 15.0 g/dL   HCT 51.8 (*) 84.1 - 66.0 %   MCV 91.8  78.0 - 100.0 fL  MCH 31.1  26.0 - 34.0 pg   MCHC 33.8  30.0 - 36.0 g/dL   RDW 16.1 (*) 09.6 - 04.5 %   Platelets 138 (*) 150 - 400 K/uL  GLUCOSE, CAPILLARY     Status: Abnormal   Collection Time   11/01/11  4:52 PM       Component Value Range   Glucose-Capillary 177 (*) 70 - 99 mg/dL  CBC     Status: Abnormal   Collection Time   11/01/11  5:06 PM      Component Value Range   WBC 6.8  4.0 - 10.5 K/uL   RBC 2.34 (*) 3.87 - 5.11 MIL/uL   Hemoglobin 7.2 (*) 12.0 - 15.0 g/dL   HCT 40.9 (*) 81.1 - 91.4 %   MCV 91.5  78.0 - 100.0 fL   MCH 30.8  26.0 - 34.0 pg   MCHC 33.6  30.0 - 36.0 g/dL   RDW 78.2 (*) 95.6 - 21.3 %   Platelets 152  150 - 400 K/uL  PREPARE RBC (CROSSMATCH)     Status: Normal   Collection Time   11/01/11  6:30 PM      Component Value Range   Order Confirmation ORDER PROCESSED BY BLOOD BANK POS     Marikay Alar, MD 11/01/2011, 4:16 PM   I have seen and examined Ms. Resende and agree with assessment plan above. My addition or corrections are edited on this H&P.  D. [place] Rolene Arbour, MD Family Medicine  PGY-2
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
## 2011-11-01 NOTE — Consult Note (Signed)
Referring Provider: No ref. provider found Primary Care Physician:  Sanjuana Letters, MD Primary Gastroenterologist:  Dr. Leone Payor  Reason for Consultation:  GIB  HPI: [name] is a 78 y.o. female presenting with three-day history of rectal bleeding.  She states she has been up since 3 AM this morning she has to arise several times at night to clean her bed due to the amount of bleeding she has been having during the night. She goes to the bathroom 3-4 times a day with episode of "flling the toilet bowl with blood"; describes it as initially being dark red, but then turning black.  She describes general malaise and fatigue acutely worsened in the past 2 days as well.  Denies dizziness.  She is on chronic oxygen, denies any chest pain, palpitations, shortness of breath. She denies abdominal pain, nausea, and vomiting.  She describes a decreased appetite and potentially feeling of early satiety for quite some time, but denies weight loss.  Also complains of dysphagia to both solids and liquids for quite some time; does not happen every time that she eats, but most times.  Does not choke.  On omeprazole 20 mg daily at home.  Takes iron 325 mg once daily at home, which she stopped right away because she thought that it was causing the dark stools.  Is on coumadin.  Admits to taking Advil up to 4 pills daily for at least the last few months for shoulder pain.  Had colonoscopy in 12/2007 for evaluation of anemia but was just found to have severe diverticulosis in the sigmoid colon.  Recommendations were to return for EGD if PCP thought anemia was due to GI bleeding.  Patient never had an EGD performed.   She went to clinic today to be evaluated due to her worsening fatigue. Her INR was found to be greater than 8. Her hemoglobin point-of-care test was 8.5.  She was sent to the hospital.  INR was 9.51 and Hgb down to 7.6 grams  Did receive Vitamin K, but no FFP at this point.  Still having black stools.     Hgb one year ago was 9.6 grams.   Past Medical History  Diagnosis Date  . Cardiac pacemaker     Dr. Juanda Chance; [employer]  . Macular hole of left eye     told will lose sight  . NICM (nonischemic cardiomyopathy)   . Atrial fibrillation   . Coronary atherosclerosis     and old MI and hx of CVA   . Depression   . Dysphagia   . DM (diabetes mellitus)     neuropathy  . CRI (chronic renal insufficiency)     cr basleine 1.5-1.6  . HTN (hypertension)   . HLD (hyperlipidemia)   . Incontinence   . CHF (congestive heart failure)     related to dyastolic dysfunction  . CAD (coronary artery disease)     nonobstructive  . Osteoarthritis   . Scoliosis   . Repeated falls     hx  . Stasis dermatitis   . Hearing loss   . Anemia   . CHF (congestive heart failure)     cardiacc ath EF 25-30%    Past Surgical History  Procedure Date  . Bliateral foot surgery 08/19/01  . Bladder tack 08/19/01  . Hysterectomy and bso 08/19/01  . Melanoma excision 08/19/01  . Pacemaker placement     x2    Prior to Admission medications   Medication Sig Start  Date End Date Taking? Authorizing Provider  acetaminophen (TYLENOL) 325 MG tablet One to two tablets three times daily as needed 10/04/10   Laurey Morale, MD  benazepril (LOTENSIN) 10 MG tablet Take 1 tablet (10 mg total) by mouth daily. 09/07/11 09/06/12  Laurey Morale, MD  carvedilol (COREG) 12.5 MG tablet TAKE ONE TABLET BY MOUTH TWICE DAILY 12/14/10   Sanjuana Letters, MD  diltiazem (CARTIA XT) 240 MG 24 hr capsule Take 1 capsule (240 mg total) by mouth daily. 03/26/11 03/25/12  Laurey Morale, MD  docusate sodium (COLACE) 100 MG capsule Take 100 mg by mouth 2 (two) times daily as needed.      Historical Provider, MD  ferrous sulfate 325 (65 FE) MG tablet Take 325 mg by mouth daily.      Historical Provider, MD  glimepiride (AMARYL) 4 MG tablet TAKE ONE TABLET BY MOUTH EVERY DAY 05/16/11   Sanjuana Letters, MD  hydrochlorothiazide  (HYDRODIURIL) 25 MG tablet Take on Wednesdays in the morning 30 minutes before you take torsemide 08/01/11   Laurey Morale, MD  NON FORMULARY Oxygen; 2 L, Dx 428.22 86% on RA     Historical Provider, MD  omeprazole (PRILOSEC) 20 MG capsule Take 1 capsule (20 mg total) by mouth daily. 04/24/11   Macy Mis, MD  polyethylene glycol (MIRALAX / Ethelene Hal) packet Take 17 g by mouth daily.      Historical Provider, MD  potassium chloride SA (K-DUR,KLOR-CON) 20 MEQ tablet Take 1 tablet (20 mEq total) by mouth daily. 06/27/11   Sanjuana Letters, MD  simvastatin (ZOCOR) 20 MG tablet TAKE ONE TABLET BY MOUTH AT BEDTIME 05/16/11   Sanjuana Letters, MD  torsemide (DEMADEX) 100 MG tablet TAKE ONE TABLET BY MOUTH EVERY DAY 08/14/11   Carney Living, MD  warfarin (COUMADIN) 2 MG tablet TAKE TWO TABLETS BY MOUTH THREE TIMES  A WEEK, THEN ONE TABLET 4 TIMES A WEEK. DOSE VARIES. MUST GET LABS CHECKED 06/13/11   Sanjuana Letters, MD    Current Facility-Administered Medications  Medication Dose Route Frequency Provider Last Rate Last Dose  . 0.9 %  sodium chloride infusion  250 mL Intravenous PRN Tobey Grim, MD      . acetaminophen (TYLENOL) tablet 650 mg  650 mg Oral Q6H PRN Tobey Grim, MD       Or  . acetaminophen (TYLENOL) suppository 650 mg  650 mg Rectal Q6H PRN Tobey Grim, MD      . phytonadione (VITAMIN K) 10 mg in dextrose 5 % 50 mL IVPB  10 mg Intravenous Once Glori Luis, MD      . phytonadione (VITAMIN K) tablet 2.5 mg  2.5 mg Oral Once Tobey Grim, MD   2.5 mg at 11/01/11 1333  . sodium chloride 0.9 % injection 3 mL  3 mL Intravenous Q12H Tobey Grim, MD      . sodium chloride 0.9 % injection 3 mL  3 mL Intravenous Q12H Tobey Grim, MD      . sodium chloride 0.9 % injection 3 mL  3 mL Intravenous PRN Tobey Grim, MD       Facility-Administered Medications Ordered in Other Encounters  Medication Dose Route Frequency Provider Last Rate Last  Dose  . DISCONTD: 0.9 %  sodium chloride infusion   Intravenous Continuous Tobey Grim, MD 20 mL/hr at 11/01/11 1023 500 mL at 11/01/11 1023    Allergies as  of 11/01/2011 - Review Complete 11/01/2011  Allergen Reaction Noted  . Codeine phosphate  03/19/2005  . Hydrocodone  04/13/2009  . Paroxetine  03/19/2005  . Tramadol hcl  04/30/2008    Family History  Problem Relation Age of Onset  . Heart attack      family hx  . Colon cancer      family hx  . Stroke      family hx  . Diabetes      DM - family hx     History   Social History  . Marital Status: Married    Spouse Name: N/A    Number of Children: N/A  . Years of Education: N/A   Occupational History  . Not on file.   Social History Main Topics  . Smoking status: Never Smoker   . Smokeless tobacco: Not on file   Comment: non smoker   . Alcohol Use: No  . Drug Use: Not on file  . Sexually Active: Not on file   Other Topics Concern  . Not on file   Social History Narrative   Lives in [place] with husband of 55 years. Was Production designer, theatre/television/film in a convenience store. Financial assistance approved for 100% discount after Medicare pays for [facility] only, not eligible for [facility] card. Rudell Cobb 12/23/09.     Review of Systems: Ten point ROS is O/W negative except as mentioned in HPI.  Physical Exam: Vital signs in last 24 hours: Temp:  [97.1 F (36.2 C)-98.2 F (36.8 C)] 98.1 F (36.7 C) (09/19 1300) Pulse Rate:  [79-99] 99  (09/19 1300) Resp:  [20-21] 20  (09/19 1300) BP: (131-132)/(61-80) 131/80 mmHg (09/19 1120) SpO2:  [97 %] 97 % (09/19 1120) Weight:  [171 lb (77.565 kg)] 171 lb (77.565 kg) (09/19 0908) Last BM Date: 11/01/11 General:   Alert, Well-developed, well-nourished, pleasant and cooperative in NAD. Head:  Normocephalic and atraumatic. Eyes:  Sclera clear, no icterus.  Conjunctiva pale. Ears:  Normal auditory acuity. Mouth:  No deformity or lesions.   Lungs:  Decreased BS B/L. Heart:  Irregularly  irregular. Abdomen:  Soft, nontender, BS active, nonpalp mass or hsm.   Rectal:  Deferred.  Black melenic stool seen in diaper.  Msk:  Symmetrical without gross deformities. . Pulses:  Normal pulses noted. Extremities:  Without clubbing or edema. Neurologic:  Alert and  oriented x4;  grossly normal neurologically. Skin:  Intact without significant lesions or rashes.. Psych:  Alert and cooperative. Normal mood and affect.  Intake/Output this shift: Total I/O In: -  Out: 500 [Urine:500]  Lab Results:  [facility] 11/01/11 1151 11/01/11 0910  WBC 6.1 --  HGB 7.6* 8.5*  HCT 22.3* --  PLT 138* --   BMET  Basename 11/01/11 1151  NA 140  K 3.6  CL 101  CO2 25  GLUCOSE 121*  BUN 124*  CREATININE 3.14*  CALCIUM 9.3   LFT  Basename 11/01/11 1151  PROT 6.7  ALBUMIN 2.9*  AST 13  ALT 13  ALKPHOS 53  BILITOT 0.2*  BILIDIR --  IBILI --   PT/INR  Basename 11/01/11 1151 11/01/11 0929  LABPROT 69.5* --  INR 9.51* >8.0   IMPRESSION:  -GIB-suspect upper source at this point.  With black stools currently.  Rule out ulcer disease vs AVM's vs malignancy, etc. -Acute on chronic anemia -Coumadin coagulopathy with supra-therapeutic INR of 9.51 -Dysphagia -Early satiety vs lack of appetite -NSAID use  PLAN (PER DR PERRY): 1-Continue to reverse SUPRATHERAPEUTIC INR.  2-Monitor Hgb and transfuse prn. 3-Will continue on PPI. 4-Can have clear liquids tonight. 5-Will try to plan for EGD 9/20 around 1 pm as long as INR is <2.5.   ZEHR, JESSICA D.  11/01/2011, 3:43 PM  Pager number [phone]  GI ATTENDING  HX,LABS, X-RAYS AND PRIOR ENDOSCOPY REPORTS REVIEWED. PT SEEN AND EXAMINED. AGREE WITH ABOVE. PATIENT WITH MINOR UGI / SB BLEED IN FACE OF MARKEDLY ELEVATED INR. PRIOR COLONOSCOPY FOR CHRONIC ANEMIA. STAYS ON PPI REGULARLY BUT TAKES NSAIDS. NO FOCAL COMPLAINTS OR ABNORMALITIES ON EXAM. PLANS AS OUTLINED ABOVE 1-5. THANKS   John N. Eda Keys., M.D. [facility] Division  of Gastroenterology

## 2011-11-01 NOTE — Progress Notes (Signed)
Patient ID: [name], female   DOB: 04-26-1930, 78 y.o.   MRN: [id number]  [facility] Teaching Service Interval Progress note:  Patient arrived from clinic to the floor. Continues to have blood per rectum with bowel movements and now has dark tarry stools. INR of 9.51 and Hb 7.6. Patient states that the bleeding started 3-4 weeks ago and is not sure what precipitated it. Patient denies being dizzy or light headed.  Endorses some chest pain, but only when she is not on her oxygen.  She is on 2L O2 via [place] at home since January.  She is unsure of the reason she is on this.  Plan: will place on Telemetry, GI consulted. Given vitamin K 10 mg IV.  Will follow cbc's every 4 hours.  Has been typed and crossed.  Consider FFP if patient continues to bleed following vit K.

## 2011-11-01 NOTE — Progress Notes (Signed)
INR >8.0 with rectal bleeding x3 days, critical value reported to Dr. Gwendolyn Grant 11-01-11 @ 9:15 am. Pt being admitted

## 2011-11-01 NOTE — Significant Event (Signed)
CRITICAL VALUE ALERT  Critical value received:  Hemoglobin 6.8  Date of notification:  11/01/11  Time of notification:  1705  Critical value read back:yes  Nurse who received alert:  Morrie Sheldon  MD notified (1st page):  Sonnenberg  Time of first page:  1706  MD notified (2nd page):  Time of second page:  Responding MD:  Sonnengerg  Time MD responded:  1710

## 2011-11-02 ENCOUNTER — Encounter (HOSPITAL_COMMUNITY)
Admission: AD | Disposition: A | Discharge status: Skilled Nursing Facility | Payer: Self-pay | Source: Ambulatory Visit | Attending: Family Medicine

## 2011-11-02 ENCOUNTER — Encounter (HOSPITAL_COMMUNITY): Payer: Self-pay

## 2011-11-02 DIAGNOSIS — K259 Gastric ulcer, unspecified as acute or chronic, without hemorrhage or perforation: Secondary | ICD-10-CM

## 2011-11-02 HISTORY — PX: ESOPHAGOGASTRODUODENOSCOPY: SHX5428

## 2011-11-02 LAB — CBC
MCV: 89.6 fL (ref 78.0–100.0)
Platelets: 129 10*3/uL — ABNORMAL LOW (ref 150–400)
RBC: 2.8 MIL/uL — ABNORMAL LOW (ref 3.87–5.11)
WBC: 7.1 10*3/uL (ref 4.0–10.5)

## 2011-11-02 LAB — GLUCOSE, CAPILLARY: Glucose-Capillary: 95 mg/dL (ref 70–99)

## 2011-11-02 LAB — BASIC METABOLIC PANEL
CO2: 25 mEq/L (ref 19–32)
Calcium: 8.8 mg/dL (ref 8.4–10.5)
GFR calc non Af Amer: 13 mL/min — ABNORMAL LOW (ref >90)
Sodium: 142 mEq/L (ref 135–145)

## 2011-11-02 LAB — PROTIME-INR: INR: 1.79 — ABNORMAL HIGH (ref 0.00–1.49)

## 2011-11-02 SURGERY — EGD (ESOPHAGOGASTRODUODENOSCOPY)
Anesthesia: Moderate Sedation

## 2011-11-02 MED ORDER — MIDAZOLAM HCL 10 MG/2ML IJ SOLN
INTRAMUSCULAR | Status: DC | PRN
  Administered 2011-11-02: 2 mg via INTRAVENOUS

## 2011-11-02 MED ORDER — CARVEDILOL 12.5 MG PO TABS
12.5000 mg | ORAL_TABLET | Freq: Two times a day (BID) | ORAL | Status: DC
  Administered 2011-11-02 – 2011-11-07 (×10): 12.5 mg via ORAL
  Filled 2011-11-02 (×13): qty 1

## 2011-11-02 MED ORDER — DIPHENHYDRAMINE HCL 50 MG/ML IJ SOLN
INTRAMUSCULAR | Status: AC
  Filled 2011-11-02: qty 1

## 2011-11-02 MED ORDER — SODIUM CHLORIDE 0.9 % IV SOLN
20.0000 ug | Freq: Once | INTRAVENOUS | Status: AC
  Administered 2011-11-02: 20 ug via INTRAVENOUS
  Filled 2011-11-02: qty 5

## 2011-11-02 MED ORDER — METOPROLOL TARTRATE 1 MG/ML IV SOLN
2.5000 mg | Freq: Four times a day (QID) | INTRAVENOUS | Status: DC
  Administered 2011-11-02 – 2011-11-05 (×10): 2.5 mg via INTRAVENOUS
  Filled 2011-11-02 (×14): qty 5

## 2011-11-02 MED ORDER — POTASSIUM CHLORIDE 10 MEQ/50ML IV SOLN
INTRAVENOUS | Status: AC
  Administered 2011-11-02: 10 meq
  Filled 2011-11-02: qty 50

## 2011-11-02 MED ORDER — BUTAMBEN-TETRACAINE-BENZOCAINE 2-2-14 % EX AERO
INHALATION_SPRAY | CUTANEOUS | Status: DC | PRN
  Administered 2011-11-02: 2 via TOPICAL

## 2011-11-02 MED ORDER — POTASSIUM CHLORIDE 10 MEQ/100ML IV SOLN
10.0000 meq | INTRAVENOUS | Status: AC
  Administered 2011-11-02 (×2): 10 meq via INTRAVENOUS
  Filled 2011-11-02: qty 100
  Filled 2011-11-02: qty 200
  Filled 2011-11-02: qty 100

## 2011-11-02 MED ORDER — METOPROLOL TARTRATE 1 MG/ML IV SOLN
INTRAVENOUS | Status: AC
  Administered 2011-11-02: 5 mg
  Filled 2011-11-02: qty 5

## 2011-11-02 MED ORDER — PANTOPRAZOLE SODIUM 40 MG PO TBEC
40.0000 mg | DELAYED_RELEASE_TABLET | Freq: Every day | ORAL | Status: DC
  Administered 2011-11-03 – 2011-11-07 (×5): 40 mg via ORAL
  Filled 2011-11-02 (×6): qty 1

## 2011-11-02 MED ORDER — DILTIAZEM HCL ER COATED BEADS 120 MG PO CP24
120.0000 mg | ORAL_CAPSULE | Freq: Every day | ORAL | Status: DC
  Administered 2011-11-02 – 2011-11-07 (×6): 120 mg via ORAL
  Filled 2011-11-02 (×6): qty 1

## 2011-11-02 MED ORDER — FENTANYL CITRATE 0.05 MG/ML IJ SOLN
INTRAMUSCULAR | Status: AC
  Filled 2011-11-02: qty 2

## 2011-11-02 MED ORDER — TRAZODONE 25 MG HALF TABLET
25.0000 mg | ORAL_TABLET | Freq: Every evening | ORAL | Status: DC | PRN
  Administered 2011-11-06: 25 mg via ORAL
  Filled 2011-11-02 (×2): qty 1

## 2011-11-02 MED ORDER — SODIUM CHLORIDE 0.9 % IV SOLN
INTRAVENOUS | Status: DC
  Administered 2011-11-02 – 2011-11-03 (×2): via INTRAVENOUS
  Administered 2011-11-03 – 2011-11-06 (×2): 20 mL/h via INTRAVENOUS
  Administered 2011-11-07: 11:00:00 via INTRAVENOUS

## 2011-11-02 MED ORDER — FENTANYL CITRATE 0.05 MG/ML IJ SOLN
INTRAMUSCULAR | Status: DC | PRN
  Administered 2011-11-02: 25 ug via INTRAVENOUS

## 2011-11-02 MED ORDER — MIDAZOLAM HCL 5 MG/ML IJ SOLN
INTRAMUSCULAR | Status: AC
  Filled 2011-11-02: qty 2

## 2011-11-02 MED ORDER — LORATADINE 10 MG PO TABS
10.0000 mg | ORAL_TABLET | Freq: Every day | ORAL | Status: DC
  Administered 2011-11-02 – 2011-11-07 (×6): 10 mg via ORAL
  Filled 2011-11-02 (×6): qty 1
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
## 2011-11-02 NOTE — Progress Notes (Signed)
Called MD on call regarding HR 100-130's, md will reassess and reorder home meds. Will continue to monitor.

## 2011-11-02 NOTE — Progress Notes (Signed)
I have seen and examined this patient. I have discussed with Dr Kuneff.  I agree with their findings and plans as documented in their progress note.       

## 2011-11-02 NOTE — Progress Notes (Signed)
Returned from endo

## 2011-11-02 NOTE — H&P (Signed)
I have seen and examined this patient. I have discussed with Dr Aviva Signs.  I agree with their findings and plans as documented in their admission note.

## 2011-11-02 NOTE — Progress Notes (Signed)
[facility] Progress Note  Subjective:  Feels ok.  No abdominal pain.  Hgb dropped O/N and she was given two units of PRBC's.  Objective:  Vital signs in last 24 hours: Temp:  [97.1 F (36.2 C)-98.6 F (37 C)] 98.3 F (36.8 C) (09/20 0751) Pulse Rate:  [65-119] 94  (09/20 0751) Resp:  [15-21] 19  (09/20 0751) BP: (100-150)/(37-99) 127/99 mmHg (09/20 0751) SpO2:  [93 %-100 %] 98 % (09/20 0751) Weight:  [171 lb (77.565 kg)-179 lb 14.3 oz (81.6 kg)] 172 lb 6.4 oz (78.2 kg) (09/20 0356) Last BM Date: 11/01/11 General:   Alert, Well-developed, in NAD. Heart:  Irregular.  No M/R/G. Pulm:  Decreased BS B/L. Abdomen:  Soft, nontender and nondistended. Normal bowel sounds. Extremities:  Without edema. Neurologic:  Alert and  oriented x4;  grossly normal neurologically. Psych:  Alert and cooperative. Normal mood and affect.  Intake/Output from previous day: 09/19 0701 - 09/20 0700 In: 12.5 [Blood:12.5] Out: 600 [Urine:600] Intake/Output this shift: Total I/O In: -  Out: 100 [Urine:100]  Lab Results:  [facility] 11/02/11 0520 11/01/11 1706 11/01/11 1626  WBC 7.1 6.8 5.9  HGB 8.8* 7.2* 6.8*  HCT 25.1* 21.4* 20.1*  PLT 129* 152 138*   BMET  Basename 11/02/11 0520 11/01/11 1151  NA 142 140  K 3.2* 3.6  CL 102 101  CO2 25 25  GLUCOSE 139* 121*  BUN 114* 124*  CREATININE 3.12* 3.14*  CALCIUM 8.8 9.3   LFT  Basename 11/01/11 1151  PROT 6.7  ALBUMIN 2.9*  AST 13  ALT 13  ALKPHOS 53  BILITOT 0.2*  BILIDIR --  IBILI --   PT/INR  Basename 11/02/11 0717 11/01/11 1151  LABPROT 20.2* 69.5*  INR 1.79* 9.51*    Ir Fluoro Guide Cv Line Right  11/02/2011  *RADIOLOGY REPORT*  Clinical data: GI bleed, coagulopathic, no venous access.  RIGHT IJ CENTRAL VENOUS CATHETER PLACEMENT UNDER ULTRASOUND AND FLUOROSCOPIC GUIDANCE:  Technique and findings: The procedure, risks (including but not limited to bleeding, infection, organ damage), benefits, and alternatives were  explained to the patient.  Questions regarding the procedure were encouraged and answered.  The patient understands and consents to the procedure.  Patency of the right IJ vein was confirmed with ultrasound with image documentation. An appropriate skin site was determined. Skin site was marked. Region was prepped using maximum barrier technique including cap and mask, sterile gown, sterile gloves, large sterile sheet, and Chlorhexidine   as cutaneous antisepsis.  The region was infiltrated locally with 1% lidocaine.   Under real-time ultrasound guidance, the right IJ vein was accessed with a 21 gauge micropuncture needle; the needle tip within the vein was confirmed with ultrasound image documentation.  The  needle exchanged over a guidewire for peel-away sheath through which an 15 cm 6-French  PowerPICC triple lumen catheter was advanced. This was positioned with the tip near the SVC/RA junction. Spot chest radiograph shows good positioning and no pneumothorax. Catheter was flushed and sutured externally with 0-Prolene sutures. Patient tolerated the procedure well, with no immediate complication.  IMPRESSION: 1. Technically successful right IJ triple lumen power injectable central venous catheter placement.   Original Report Authenticated By: Osa Craver, M.D.    Ir US Guide Vasc Access Right  11/02/2011  *RADIOLOGY REPORT*  Clinical data: GI bleed, coagulopathic, no venous access.  RIGHT IJ CENTRAL VENOUS CATHETER PLACEMENT UNDER ULTRASOUND AND FLUOROSCOPIC GUIDANCE:  Technique and findings: The procedure, risks (including but not limited to  bleeding, infection, organ damage), benefits, and alternatives were explained to the patient.  Questions regarding the procedure were encouraged and answered.  The patient understands and consents to the procedure.  Patency of the right IJ vein was confirmed with ultrasound with image documentation. An appropriate skin site was determined. Skin site was marked.  Region was prepped using maximum barrier technique including cap and mask, sterile gown, sterile gloves, large sterile sheet, and Chlorhexidine   as cutaneous antisepsis.  The region was infiltrated locally with 1% lidocaine.   Under real-time ultrasound guidance, the right IJ vein was accessed with a 21 gauge micropuncture needle; the needle tip within the vein was confirmed with ultrasound image documentation.  The  needle exchanged over a guidewire for peel-away sheath through which an 15 cm 6-French  PowerPICC triple lumen catheter was advanced. This was positioned with the tip near the SVC/RA junction. Spot chest radiograph shows good positioning and no pneumothorax. Catheter was flushed and sutured externally with 0-Prolene sutures. Patient tolerated the procedure well, with no immediate complication.  IMPRESSION: 1. Technically successful right IJ triple lumen power injectable central venous catheter placement.   Original Report Authenticated By: Osa Craver, M.D.     Assessment / Plan: -GIB-suspect upper source at this point. With black stools. Rule out ulcer disease vs AVM's vs malignancy, etc.  -Acute on chronic anemia.  S/P 2 units of PRBC's O/N. -Coumadin coagulopathy with supra-therapeutic INR.  Now reversed.  -Dysphagia  -Early satiety vs lack of appetite  -NSAID use  *Monitor Hgb and transfuse prn. *Continue on PPI IV BID. *EGD today at 1:30 pm.   LOS: 1 day   Catelyn Friel D.  11/02/2011, 8:50 AM  Pager number [phone]

## 2011-11-02 NOTE — Progress Notes (Signed)
Pt going to endo for EGD via bed, oxygen @2l /min with rn and transporter. [name]

## 2011-11-02 NOTE — Consult Note (Signed)
[facility] CONSULT NOTE    Date: 11/02/2011                  Patient Name:  [name]  MRN: [id number]  DOB: [birth year]  Age / Sex: 78 y.o., female         PCP: Sanjuana Letters, MD                 Service Requesting Consult: Family Medicine                 Reason for Consult: Acute on Chronic Kidney Injury             History of Present Illness: Patient is a 78 y.o. female with a PMHx of Chronic Kidney Disease Stage IV  (baseline Cr 2.3), COPD, CAD s/p MI, diastolic CHF in the setting of hypertrophic cardiomyopathy, and chronic atrial fibrillation on anticoagulation, who was admitted to [facility] on 11/01/2011 for evaluation of rectal bleeding. It was discovered that the patient had an INR of 9.5 and hemoglobin of 6.8. Yesterday she was given Vit. K, and her INR is now improved to 1.79. Her hemoglobin is now 8.8 s/p 2 units of PRBC's. The patient also had a bump in her creatinine from a baseline of 2.3 in July to 3.1 on admission. It was associated with an elevated BUN of 124. Currently, the patient is resting in bed and denies any pain ro shortness of breath. She does not generalized itching. She denies shortness of breath or swelling in her feet or ankles. She still has bloody stool, but the bleeding has reduced significantly. She is scheduled to go for an EGD by the GI team shortly. She tells me that she has been taking 4 advil daily for quite some time   Medications: Outpatient medications: Prescriptions prior to admission  Medication Sig Dispense Refill  . benazepril (LOTENSIN) 10 MG tablet Take 1 tablet (10 mg total) by mouth daily.  30 tablet  6  . carvedilol (COREG) 12.5 MG tablet Take 12.5 mg by mouth 2 (two) times daily with a meal.      . diltiazem (CARTIA XT) 240 MG 24 hr capsule Take 1 capsule (240 mg total) by mouth daily.  30 capsule  11  . docusate sodium (COLACE) 100 MG capsule Take 100 mg by mouth 2 (two) times daily as needed.        . ferrous  sulfate 325 (65 FE) MG tablet Take 325 mg by mouth daily.        Marland Kitchen glimepiride (AMARYL) 4 MG tablet Take 4 mg by mouth daily before breakfast.      . hydrochlorothiazide (HYDRODIURIL) 25 MG tablet Take on Wednesdays in the morning 30 minutes before you take torsemide      . ibuprofen (ADVIL,MOTRIN) 200 MG tablet Take 200 mg by mouth every 6 (six) hours as needed. For pain      . omeprazole (PRILOSEC) 20 MG capsule Take 1 capsule (20 mg total) by mouth daily.  30 capsule  12  . polyethylene glycol (MIRALAX / GLYCOLAX) packet Take 17 g by mouth daily.        . potassium chloride SA (K-DUR,KLOR-CON) 20 MEQ tablet Take 1 tablet (20 mEq total) by mouth daily.  90 tablet  3  . simvastatin (ZOCOR) 20 MG tablet Take 20 mg by mouth every evening.      . torsemide (DEMADEX) 100 MG tablet Take 100 mg by mouth daily.      Marland Kitchen  warfarin (COUMADIN) 2 MG tablet TAKE TWO TABLETS BY MOUTH THREE TIMES  A WEEK, THEN ONE TABLET 4 TIMES A WEEK. DOSE VARIES. MUST GET LABS CHECKED  60 tablet  6  . NON FORMULARY Oxygen; 2 L, Dx 428.22 86% on RA         Current medications: Current Facility-Administered Medications  Medication Dose Route Frequency Provider Last Rate Last Dose  . 0.9 %  sodium chloride infusion  250 mL Intravenous PRN Tobey Grim, MD      . 0.9 %  sodium chloride infusion   Intravenous Continuous Elenora Gamma, MD      . acetaminophen (TYLENOL) tablet 650 mg  650 mg Oral Q6H PRN Tobey Grim, MD   650 mg at 11/01/11 1938   Or  . acetaminophen (TYLENOL) suppository 650 mg  650 mg Rectal Q6H PRN Tobey Grim, MD      . loratadine (CLARITIN) tablet 10 mg  10 mg Oral Daily Elenora Gamma, MD      . metoprolol (LOPRESSOR) 1 MG/ML injection        5 mg at 11/02/11 0909  . metoprolol (LOPRESSOR) injection 2.5 mg  2.5 mg Intravenous Q6H Todd D McDiarmid, MD      . ondansetron (ZOFRAN) injection 4 mg  4 mg Intravenous Once Dayarmys Piloto de Criselda Peaches, MD   4 mg at 11/01/11 2050  . pantoprazole  (PROTONIX) injection 40 mg  40 mg Intravenous Q12H Jessica Zehr, PA   40 mg at 11/02/11 0933  . phytonadione (VITAMIN K) 10 mg in dextrose 5 % 50 mL IVPB  10 mg Intravenous Once Glori Luis, MD   10 mg at 11/01/11 1904  . phytonadione (VITAMIN K) tablet 2.5 mg  2.5 mg Oral Once Tobey Grim, MD   2.5 mg at 11/01/11 1333  . sodium chloride 0.9 % injection 10-40 mL  10-40 mL Intracatheter Q12H Tobey Grim, MD   30 mL at 11/02/11 0932  . sodium chloride 0.9 % injection 10-40 mL  10-40 mL Intracatheter PRN Tobey Grim, MD      . traZODone (DESYREL) tablet 25 mg  25 mg Oral QHS PRN Elenora Gamma, MD      . DISCONTD: sodium chloride 0.9 % injection 3 mL  3 mL Intravenous Q12H Tobey Grim, MD      . DISCONTD: sodium chloride 0.9 % injection 3 mL  3 mL Intravenous Q12H Tobey Grim, MD      . DISCONTD: sodium chloride 0.9 % injection 3 mL  3 mL Intravenous PRN Tobey Grim, MD       Facility-Administered Medications Ordered in Other Encounters  Medication Dose Route Frequency Provider Last Rate Last Dose  . DISCONTD: 0.9 %  sodium chloride infusion   Intravenous Continuous Tobey Grim, MD 20 mL/hr at 11/01/11 1023 500 mL at 11/01/11 1023      Allergies: Allergies  Allergen Reactions  . Paroxetine Other (See Comments)    hallucinations and falls  . Codeine Phosphate Rash  . Hydrocodone Itching and Nausea Only    REACTION: nausea and pruritis  . Tramadol Hcl Hives      Past Medical History: Past Medical History  Diagnosis Date  . Cardiac pacemaker     Dr. Juanda Chance; [employer] VVI  . Macular hole of left eye     told will lose sight  . NICM (nonischemic cardiomyopathy)   . Atrial fibrillation  off anticoagulation presumed to anemia  . Coronary atherosclerosis     and old MI and hx of CVA   . Depression   . Dysphagia   . DM (diabetes mellitus)     nueropathy  . CRI (chronic renal insufficiency)     cr basleine 1.5-1.6  . HTN (hypertension)     . HLD (hyperlipidemia)   . Incontinence   . CHF (congestive heart failure)     related to dyastolic dysfunction  . CAD (coronary artery disease)     nonobstructive  . Osteoarthritis   . Scoliosis   . Repeated falls     hx  . Stasis dermatitis   . Hearing loss   . Anemia   . CHF (congestive heart failure)     cardiacc ath EF 25-30%     Past Surgical History: Past Surgical History  Procedure Date  . Bliateral foot surgery 08/19/01  . Bladder tack 08/19/01  . Hysterectomy and bso 08/19/01  . Melanoma excision 08/19/01  . Pacemaker placement     x2     Family History: Family History  Problem Relation Age of Onset  . Heart attack      family hx  . Colon cancer      family hx  . Stroke      family hx  . Diabetes      DM - family hx      Social History: History   Social History  . Marital Status: Married    Spouse Name: N/A    Number of Children: N/A  . Years of Education: N/A   Occupational History  . Not on file.   Social History Main Topics  . Smoking status: Never Smoker   . Smokeless tobacco: Not on file   Comment: non smoker   . Alcohol Use: No  . Drug Use: Not on file  . Sexually Active: Not on file   Other Topics Concern  . Not on file   Social History Narrative   Lives in [place] with husband of 55 years. Was Production designer, theatre/television/film in a convenience store. Financial assistance approved for 100% discount after Medicare pays for [facility] only, not eligible for [facility] card. Rudell Cobb 12/23/09.      Review of Systems: As per HPI  Vital Signs: Blood pressure 127/99, pulse 94, temperature 98.4 F (36.9 C), temperature source Oral, resp. rate 19, height 5\' 5"  (1.651 m), weight 172 lb 6.4 oz (78.2 kg), SpO2 98.00%.  Weight trends: Filed Weights   11/01/11 1853 11/02/11 0356  Weight: 179 lb 14.3 oz (81.6 kg) 172 lb 6.4 oz (78.2 kg)    Physical Exam: General: Vital signs reviewed and noted. Elderly female, appears older than stated age, ill appearing but alert and  pleasant  Head: Normocephalic, atraumatic.    Eyes: PERRL, EOMI, conjunctival pallor  Nose: Mucous membranes dry, not inflammed, nonerythematous.  Throat: Oropharynx nonerythematous, no exudate appreciated.   Neck: Right IJ central catheter.   Lungs:  Decreased respiratory effort. Clear to auscultation BL without crackles or wheezes.  Heart: Irregularly irregular rhythm   Abdomen:  BS normoactive. Soft, Nondistended, non-tender.  No masses or organomegaly.  Extremities: No pretibial edema.  Neurologic: A&O X3, CN II - XII are grossly intact. Motor strength is 5/5 in the all 4 extremities, Sensations intact to light touch, Cerebellar signs negative.  Skin: Numerous ecchymoses on upper extremities, hemosiderin deposition on LE bilaterally     Lab results: Basic Metabolic Panel:  Lab 11/02/11  0520 Nov 30, 2011 1151  NA 142 140  K 3.2* 3.6  CL 102 101  CO2 25 25  GLUCOSE 139* 121*  BUN 114* 124*  CREATININE 3.12* 3.14*  CALCIUM 8.8 9.3  MG -- --  PHOS -- --    Liver Function Tests:  Lab November 30, 2011 1151  AST 13  ALT 13  ALKPHOS 53  BILITOT 0.2*  PROT 6.7  ALBUMIN 2.9*   No results found for this basename: LIPASE:3,AMYLASE:3 in the last 168 hours No results found for this basename: AMMONIA:3 in the last 168 hours  CBC:  Lab 11/02/11 0520 November 30, 2011 1706 11-30-2011 1626 11/30/11 1151  WBC 7.1 6.8 5.9 --  NEUTROABS -- -- -- --  HGB 8.8* 7.2* 6.8* --  HCT 25.1* 21.4* 20.1* --  MCV 89.6 91.5 91.8 91.0  PLT 129* 152 138* --    Cardiac Enzymes: No results found for this basename: CKTOTAL:5,CKMB:5,CKMBINDEX:5,TROPONINI:5 in the last 168 hours  BNP: No components found with this basename: POCBNP:3  CBG:  Lab 11/02/11 0749 Nov 30, 2011 2151 November 30, 2011 1652 11-30-2011 1118  GLUCAP 118* 206* 177* 119*    Microbiology: Results for orders placed during the hospital encounter of 11-30-11  MRSA PCR SCREENING     Status: Normal   Collection Time   11-30-11  7:01 PM      Component Value  Range Status Comment   MRSA by PCR NEGATIVE  NEGATIVE Final     Coagulation Studies:  Basename 11/02/11 0717 11/30/11 1151 30-Nov-2011 0929  LABPROT 20.2* 69.5* --  INR 1.79* 9.51* >8.0    Urinalysis: No results found for this basename: COLORURINE:2,APPERANCEUR:2,LABSPEC:2,PHURINE:2,GLUCOSEU:2,HGBUR:2,BILIRUBINUR:2,KETONESUR:2,PROTEINUR:2,UROBILINOGEN:2,NITRITE:2,LEUKOCYTESUR:2 in the last 72 hours    Imaging: Ir Fluoro Guide Cv Line Right  11/02/2011  *RADIOLOGY REPORT*  Clinical data: GI bleed, coagulopathic, no venous access.  RIGHT IJ CENTRAL VENOUS CATHETER PLACEMENT UNDER ULTRASOUND AND FLUOROSCOPIC GUIDANCE:  Technique and findings: The procedure, risks (including but not limited to bleeding, infection, organ damage), benefits, and alternatives were explained to the patient.  Questions regarding the procedure were encouraged and answered.  The patient understands and consents to the procedure.  Patency of the right IJ vein was confirmed with ultrasound with image documentation. An appropriate skin site was determined. Skin site was marked. Region was prepped using maximum barrier technique including cap and mask, sterile gown, sterile gloves, large sterile sheet, and Chlorhexidine   as cutaneous antisepsis.  The region was infiltrated locally with 1% lidocaine.   Under real-time ultrasound guidance, the right IJ vein was accessed with a 21 gauge micropuncture needle; the needle tip within the vein was confirmed with ultrasound image documentation.  The  needle exchanged over a guidewire for peel-away sheath through which an 15 cm 6-French  PowerPICC triple lumen catheter was advanced. This was positioned with the tip near the SVC/RA junction. Spot chest radiograph shows good positioning and no pneumothorax. Catheter was flushed and sutured externally with 0-Prolene sutures. Patient tolerated the procedure well, with no immediate complication.  IMPRESSION: 1. Technically successful right IJ  triple lumen power injectable central venous catheter placement.   Original Report Authenticated By: Osa Craver, M.D.    Ir US Guide Vasc Access Right  11/02/2011  *RADIOLOGY REPORT*  Clinical data: GI bleed, coagulopathic, no venous access.  RIGHT IJ CENTRAL VENOUS CATHETER PLACEMENT UNDER ULTRASOUND AND FLUOROSCOPIC GUIDANCE:  Technique and findings: The procedure, risks (including but not limited to bleeding, infection, organ damage), benefits, and alternatives were explained to the patient.  Questions regarding the  procedure were encouraged and answered.  The patient understands and consents to the procedure.  Patency of the right IJ vein was confirmed with ultrasound with image documentation. An appropriate skin site was determined. Skin site was marked. Region was prepped using maximum barrier technique including cap and mask, sterile gown, sterile gloves, large sterile sheet, and Chlorhexidine   as cutaneous antisepsis.  The region was infiltrated locally with 1% lidocaine.   Under real-time ultrasound guidance, the right IJ vein was accessed with a 21 gauge micropuncture needle; the needle tip within the vein was confirmed with ultrasound image documentation.  The  needle exchanged over a guidewire for peel-away sheath through which an 15 cm 6-French  PowerPICC triple lumen catheter was advanced. This was positioned with the tip near the SVC/RA junction. Spot chest radiograph shows good positioning and no pneumothorax. Catheter was flushed and sutured externally with 0-Prolene sutures. Patient tolerated the procedure well, with no immediate complication.  IMPRESSION: 1. Technically successful right IJ triple lumen power injectable central venous catheter placement.   Original Report Authenticated By: Osa Craver, M.D.       Assessment & Plan: Patient is a 78 y.o. female with a PMHx of Chronic Kidney Disease Stage IV  (baseline Cr 2.3), COPD, CAD s/p MI, diastolic CHF in the  setting of hypertrophic cardiomyopathy, and chronic atrial fibrillation on anticoagulation, who was admitted to [facility] on 11/01/2011 for treatment of acute blood loss anemia secondary to active rectal bleeding.   Anemia, Blood Loss: The patient presented with a Hgb of 6.8 (from a baseline of 9.0) secondary to rectal bleeding. The patient had a supratherapeutic INR to 9.51. The bleeding has decreased after the administration of vitamin K to reverse the INR, and her hemoglobin is stable after 2 units of PRBC's overnight.  - patient to go for endoscopy for evaluation of GI bleeding  NSAIDS probably exacerbated - patient would also likely benefit from iron studies since non available since 2009, which I will order for tomorrow AM  Acute on Chronic Renal Insufficiency: Stage IV chronic kidney disease with baseline Cr of 2.3 and take torsemide daily and HCTZ week as an outpatient; presented with a Cr of 3.14 and BUN of 114. The most likely etiology is hypoperfusion secondary to blood loss anemia causing acute tubular necrosis. The patient is currently oliguric, but is likely a combination of volume depletion from her blood loss and kidney injury.  - No medications at this time as the patient is NPO for a GI procedure and does not appearing clinically volume overloaded or uremic - Increase the rate of NS infusion to 125 mL/hr to challenge kidney for UOP since oliguric, but avoid over hydration as the patient has diastolic CHF and mildly reduced EF to 50%  - check renal panel q AM again NSAIDS probably exacerbated  Bone Health: Given that patient's long standing kidney disease, it is possible that she is experiencing osteodystrophy - check PTH tomorrow AM  Hypokalemia:  Normal upon presentation, mildly reduced at 3.2 - since NPO given 3 runs of 10 meq of IV K+   HTN: Home coreg, carvedilol, HCTZ, torsemide, and benazepril being held since normotensive and with AKI - continue to hold benazepril 2/2 increased  creatinine - continue to hold torsemide and HCTZ as patient does not appear volume overloaded and is receiving IV fluids    DVT PPX - SCD's per primary team   Si Raider. Clinton Sawyer, MD  Patient seen and examined, agree with  above note with above modifications. Briefly a 78 year old WF with multiple medical issues including baseline stage 4 CKD.  She is now admitted with GIB, supratherapeutic INR as well as acute on chronic renal failure (pt was taking daily NSAIDS and ACE).  UOP is unclear but if anything patient looks dry.  Will give IVF and follow closely.  Will also give DDAVP for uremic platelets, check iron stores and PTH.  Patient would likely benefit from renal follow up at discharge Annie Sable, MD 11/02/2011
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
## 2011-11-02 NOTE — Progress Notes (Signed)
Utilization review completed.  

## 2011-11-03 DIAGNOSIS — R079 Chest pain, unspecified: Secondary | ICD-10-CM

## 2011-11-03 LAB — CBC
Hemoglobin: 7.2 g/dL — ABNORMAL LOW (ref 12.0–15.0)
MCH: 30.7 pg (ref 26.0–34.0)
MCHC: 32.9 g/dL (ref 30.0–36.0)
MCHC: 33 g/dL (ref 30.0–36.0)
Platelets: 115 10*3/uL — ABNORMAL LOW (ref 150–400)
Platelets: 126 10*3/uL — ABNORMAL LOW (ref 150–400)
RDW: 16.8 % — ABNORMAL HIGH (ref 11.5–15.5)
RDW: 16.8 % — ABNORMAL HIGH (ref 11.5–15.5)

## 2011-11-03 LAB — URINALYSIS, MICROSCOPIC ONLY
Bilirubin Urine: NEGATIVE
Glucose, UA: NEGATIVE mg/dL
Protein, ur: 30 mg/dL — AB
Urobilinogen, UA: 0.2 mg/dL (ref 0.0–1.0)

## 2011-11-03 LAB — GLUCOSE, CAPILLARY
Glucose-Capillary: 122 mg/dL — ABNORMAL HIGH (ref 70–99)
Glucose-Capillary: 234 mg/dL — ABNORMAL HIGH (ref 70–99)
Glucose-Capillary: 116 mg/dL — ABNORMAL HIGH (ref 70–99)

## 2011-11-03 LAB — IRON AND TIBC: Saturation Ratios: 11 % — ABNORMAL LOW (ref 20–55)

## 2011-11-03 LAB — PREPARE RBC (CROSSMATCH)

## 2011-11-03 LAB — RENAL FUNCTION PANEL
Albumin: 2.5 g/dL — ABNORMAL LOW (ref 3.5–5.2)
CO2: 24 mEq/L (ref 19–32)
Calcium: 8.7 mg/dL (ref 8.4–10.5)
GFR calc Af Amer: 18 mL/min — ABNORMAL LOW (ref >90)
GFR calc non Af Amer: 16 mL/min — ABNORMAL LOW (ref >90)
Sodium: 142 mEq/L (ref 135–145)

## 2011-11-03 LAB — PROTIME-INR
INR: 1.41 (ref 0.00–1.49)
Prothrombin Time: 16.9 seconds — ABNORMAL HIGH (ref 11.6–15.2)

## 2011-11-03 LAB — TROPONIN I: Troponin I: <0.3 ng/mL (ref <0.30)

## 2011-11-03 LAB — FERRITIN: Ferritin: 214 ng/mL (ref 10–291)

## 2011-11-03 MED ORDER — NITROGLYCERIN 0.4 MG SL SUBL
SUBLINGUAL_TABLET | SUBLINGUAL | Status: AC
  Administered 2011-11-03: 0.4 mg via SUBLINGUAL
  Filled 2011-11-03: qty 25

## 2011-11-03 MED ORDER — MORPHINE SULFATE 2 MG/ML IJ SOLN
2.0000 mg | Freq: Once | INTRAMUSCULAR | Status: AC
  Administered 2011-11-03: 2 mg via INTRAVENOUS

## 2011-11-03 MED ORDER — SODIUM CHLORIDE 0.9 % IV SOLN
125.0000 mg | Freq: Once | INTRAVENOUS | Status: DC
  Filled 2011-11-03: qty 10

## 2011-11-03 MED ORDER — SODIUM CHLORIDE 0.9 % IV SOLN
125.0000 mg | Freq: Once | INTRAVENOUS | Status: AC
  Administered 2011-11-03: 125 mg via INTRAVENOUS
  Filled 2011-11-03: qty 10

## 2011-11-03 MED ORDER — NITROGLYCERIN 0.4 MG SL SUBL
0.4000 mg | SUBLINGUAL_TABLET | SUBLINGUAL | Status: AC | PRN
  Administered 2011-11-03: 0.4 mg via SUBLINGUAL

## 2011-11-03 MED ORDER — MORPHINE SULFATE 2 MG/ML IJ SOLN
INTRAMUSCULAR | Status: AC
  Filled 2011-11-03: qty 1

## 2011-11-03 NOTE — Progress Notes (Signed)
Patient ID: [name], female   DOB: Jul 10, 1930, 78 y.o.   MRN: [id number]          Family Medicine Teaching Service                                                                 [facility] Progress Note     Patient name: [name] Medical record number: [id number] Date of birth: 22-Mar-1930 Age: 78 y.o. Gender: female    LOS: 2 days   Primary Care Provider: Sanjuana Letters, MD  Overnight Events: Continues to have some dark stools, though states this is much improved from yesterday. No other complaints.   Objective: Vital signs in last 24 hours: BP 116/54  Pulse 89  Temp 97.5 F (36.4 C) (Oral)  Resp 17  Ht 5\' 5"  (1.651 m)  Wt 171 lb 15.3 oz (78 kg)  BMI 28.62 kg/m2  SpO2 98%    Physical Exam: Gen: NAD. Extremely pleasant women. CV: RRR. No murmurs, gallops, or rubs  Res: Clear breath sounds. No rales. No wheezes. Abd: Soft. Non tender. Non distended. Ext/Musc: No edema Neuro: grossly intact.  Labs/Studies:  Results for orders placed during the hospital encounter of 11/01/11 (from the past 24 hour(s))  GLUCOSE, CAPILLARY     Status: Normal   Collection Time   11/02/11 11:39 AM      Component Value Range   Glucose-Capillary 95  70 - 99 mg/dL   Comment 1 Documented in Chart     Comment 2 Notify RN    RENAL FUNCTION PANEL     Status: Abnormal   Collection Time   11/03/11  3:47 AM      Component Value Range   Sodium 142  135 - 145 mEq/L   Potassium 3.5  3.5 - 5.1 mEq/L   Chloride 106  96 - 112 mEq/L   CO2 24  19 - 32 mEq/L   Glucose, Bld 158 (*) 70 - 99 mg/dL   BUN 95 (*) 6 - 23 mg/dL   Creatinine, Ser 2.44 (*) 0.50 - 1.10 mg/dL   Calcium 8.7  8.4 - 01.0 mg/dL   Phosphorus 4.8 (*) 2.3 - 4.6 mg/dL   Albumin 2.5 (*) 3.5 - 5.2 g/dL   GFR calc non Af Amer 16 (*) >90 mL/min   GFR calc Af Amer 18 (*) >90 mL/min  CBC     Status: Abnormal   Collection Time   11/03/11  7:01 AM      Component Value Range   WBC 6.8  4.0 - 10.5 K/uL   RBC 2.37 (*) 3.87 - 5.11  MIL/uL   Hemoglobin 7.2 (*) 12.0 - 15.0 g/dL   HCT 27.2 (*) 53.6 - 64.4 %   MCV 92.4  78.0 - 100.0 fL   MCH 30.4  26.0 - 34.0 pg   MCHC 32.9  30.0 - 36.0 g/dL   RDW 03.4 (*) 74.2 - 59.5 %   Platelets 115 (*) 150 - 400 K/uL  PROTIME-INR     Status: Abnormal   Collection Time   11/03/11  7:01 AM      Component Value Range   Prothrombin Time 16.9 (*) 11.6 - 15.2 seconds   INR 1.41  0.00 - 1.49  Medications: Scheduled Meds:    . carvedilol  12.5 mg Oral BID WC  . desmopressin (DDAVP) IV  20 mcg Intravenous Once  . diltiazem  120 mg Oral Daily  . loratadine  10 mg Oral Daily  . metoprolol      . metoprolol  2.5 mg Intravenous Q6H  . pantoprazole  40 mg Oral Q0600  . potassium chloride  10 mEq Intravenous Q1 Hr x 3  . potassium chloride      . sodium chloride  10-40 mL Intracatheter Q12H  . DISCONTD: pantoprazole (PROTONIX) IV  40 mg Intravenous Q12H   Continuous Infusions:    . sodium chloride 125 mL/hr at 11/03/11 0600  . DISCONTD: sodium chloride     PRN Meds:.sodium chloride, acetaminophen, acetaminophen, sodium chloride, traZODone, DISCONTD: butamben-tetracaine-benzocaine, DISCONTD: fentaNYL, DISCONTD: midazolam  Echo: 55-60% EF   Assessment/Plan: [name] is a 78 y.o. year old female with a history of DMII, HLD, HTN, MI, afib on coumadin, and CKD IV who presents with a 3-4 week history of rectal bleeding and recent increase in bruising.   1. Rectal bleeding: patient with history of diverticulosis. Differential also includes PUD, angiodysplasia, AVM, neoplastic cause. INR supratherapeutic at 9.51 at admission. Hb of 7.6 at time of admission that dropped to 6.8.  - HgB: down to 7.2 from 8.8 - INR: 1.41-will allow to stay subtherapeutic as risk of continued GI bleed outweighs risk of stroke in this setting - Central line placed  - typed and crossed-may need to consider transfusion if patient becomes symptomatic or drops below 7 Hb. - GI consulted recs continue  PPI, avoid NSAIDs, treat H. Pylori if positive. GI has signed off. - Last bloody stool: today-continues to have dark tarry stools - PT/OT today - EGD: stomach with antral erosions and small clean based ulcer without bleeding.  - Follow Potassium, consider replacement-labs pending this morning  2. Supratherapeutic INR: With PO vit K 2.5 mg and IV vit K 10 mg.  INR 1.41 today. Will continue to hold coumadin as patient continues to have drop in Hb.  3. DM: glucose on admission 121. Will consider SSI if glucose over 200. Held home glipizide.   4. HTN: BP currently 116/54. Difficult to appreciate how much of this low normal BP is due to blood loss and how much of this is due to restarting of medications, likely is a combination of both.  Will continue to watch hemodynamic status. Coreg 12.5 and dilt 120 started. Metoprolol 2.5 continued.   5. Afib: patient with pacemaker in place. Pulse currently 89. Rate controlled on dilt 120.  Could consider going up on this if we think tachycardia is completely attributable to afib, as opposed to a sign of volume depletion.  6: CKD stage IV: GFR of 13 on admission. Creatinine 3.14. Pt's baseline around 2.3. Pt CO2 and K wnl. Consulted with nephrology who recs DDAVP, anemia panel, and PTH. All labs pending.  Also recs nulecit 125 mg for 2 doses while in hospital.  7. FEN/GI: - kvo - heart healthy diet   8. Prophylaxis: SCDs, patient is actively bleeding no anticoagulation; currently off. Loratadine for itch.  Sleep aide: trazodone QHS PRN  9. Dispo: pending improvement in bleeding and stable hemodynamically

## 2011-11-03 NOTE — Progress Notes (Signed)
FMTS Attending Note Patient seen and examined by me, discussed with resident and I agree with Dr Purvis Sheffield note with following additions.  Patient seen by me @12noon ; sitting on bedside commode. Reports feels somewhat lightheaded with standing (with assist).   Alert and talkative; markedly pale conjunctivae.  HR 90 during my visit.  ABD soft, nontender.  Lungs clear.   Assess/Plan: Patient with GIB, has experienced a 1.6-point drop in Hgb since yesterday.  To follow her Hgb; if dropping, then to re-consult GI service for consideration colonoscopic evaluation, as EGD did not reveal active bleeding. Her warfarin is given for indication of stroke prophylaxis in setting of AF; to hold warfarin due to recent (possibly active) GIB. Close monitoring.  Paula Compton, MD

## 2011-11-03 NOTE — Consult Note (Signed)
Primary Cardiologist: Dr. Shirlee Latch Corinda Gubler)  Patient Location: Room [phone]  Reason for consultation: chest pain  Requesting Physician from primary team: Dr. Claiborne Billings (Family Medicine)  HPI:  Ms. Foutz is a 78 year old woman with an extensive past medical history, nonobstructive coronary disease, atrial fibrillation on chronic anticoagulation, and non-ischemic cardiomyopathy (records refer to hypertrophic echo appearance) who was admitted to the [facility] Medicine service with rectal bleeding and anemia.  Her hospital course thus far has been notable for multiple transfusions with recurrent drops in Hgb. Upper endoscopy revealed non-bleeding ulcers. Colonoscopy has not been performed during this hospitalization. She has also been managed for acute on chronic kidney failure.   Earlier this evening, the patient awoke and complained of substernal chest pain. An ECG was performed and we were then asked to see the patient in consultation to offer any additional management recommendations.   Past Medical History  Diagnosis Date  . Cardiac pacemaker     Dr. Juanda Chance; [employer] VVI  . Macular hole of left eye     told will lose sight  . NICM (nonischemic cardiomyopathy)   . Atrial fibrillation     off anticoagulation presumed to anemia  . Coronary atherosclerosis     and old MI and hx of CVA   . Depression   . Dysphagia   . DM (diabetes mellitus)     nueropathy  . CRI (chronic renal insufficiency)     cr basleine 1.5-1.6  . HTN (hypertension)   . HLD (hyperlipidemia)   . Incontinence   . CHF (congestive heart failure)     related to dyastolic dysfunction  . CAD (coronary artery disease)     nonobstructive  . Osteoarthritis   . Scoliosis   . Repeated falls     hx  . Stasis dermatitis   . Hearing loss   . Anemia   . CHF (congestive heart failure)     cardiacc ath EF 25-30%  . Pacemaker   . Cancer   . Stroke     Past Surgical History  Procedure Date  . Bliateral foot surgery 08/19/01    . Bladder tack 08/19/01  . Hysterectomy and bso 08/19/01  . Melanoma excision 08/19/01  . Pacemaker placement     x2  . Tonsillectomy   . Appendectomy     Family History  Problem Relation Age of Onset  . Heart attack      family hx  . Colon cancer      family hx  . Stroke      family hx  . Diabetes      DM - family hx    Social History:  Non-smoker  Allergies:  Allergies  Allergen Reactions  . Paroxetine Other (See Comments)    hallucinations and falls  . Codeine Phosphate Rash  . Hydrocodone Itching and Nausea Only    REACTION: nausea and pruritis  . Tramadol Hcl Hives   Current Medications: Carvedilol 12.5mg  BID Diltiazem 120mg  Loratidine 10mg  Metoprolol 2.5mg  IV QID Pantoprazole 40mg   Exam: Afebrile 82 113/58 18 99% 2L [place] Head of bed at approx 60 degrees; no evident JVD on left side (R side not assessable due to indwelling vascular catheter) Regular S1 S2, systolic murmur heard best at LUSB Coarse breath sounds bilaterally upon anterior auscultation Abdomen soft, non-tender, non-distended Ext warm, well-perfused, with trace-1+ pitting edema in her bilateral LEs Skin warm & dry Neuro A&Ox3  Labs: Troponin <0.30 (9pm) WBC 6.5K Hgb 7.1 Plts 126K Na 142  K 3.5 Cl 106 CO2 24 BUN 95 Cr 2.7 INR 1.4  ECG: several paced complexes; otherwise atrial fibrillation with ventricular rate of approximately 80; lateral ST-T abnormalities which appear present on prior ECGs are are likely due in part to repolarization abnormalities in the setting of LVH; no ST elevation present  Assessment/Plan 78 year old woman underlying hypertrophic heart disease and chronic kidney disease who presents to [facility] with rectal bleeding with resulting anemia requiring serial transfusions. She developed an episode of chest tightness this evening, which may very have been due to demand/subendocardial ischemia in the setting of ongoing blood loss & anemia. She is chest pain-free after  a dose of morphine. Her ECG does not suggest trans-mural ischemia and an initial troponin draw was normal (though drawn shortly after pain episode). Given her ongoing bleeding & transfusions and her current lack of chest pain, I would not recommend any changes to current management. The top priority is addressing, as you are, the likely driver of her demand ischemia (i.e. bleeding). Given her bleeding, neither anticoagulation nor anti-platelet agents would offer net benefits at this point in time.   For now, recommend continued supportive therapies: - Continue aggressive correction of her anemia and ongoing bleeding issues - No IV Heparin or anti-platelet agents while she demonstrates continued evidence of bleeding - Continue trending of her cardiac troponin level  Thank you for involving [place] in her care. We will follow her with you. Please do not hesitate to contact us with any concerns/questions.  Zacarias Pontes, MD Cardiology Fellow On-Call [phone]

## 2011-11-03 NOTE — Progress Notes (Signed)
Subjective:  Says she slept well overnight, got up 3 times to urinate , also admits that she is incontinent of urine at times.  Says stools have slowed down as well.    Objective Vital signs in last 24 hours: Filed Vitals:   11/02/11 1554 11/02/11 2000 11/03/11 0009 11/03/11 0425  BP: 131/76 117/60 123/63 110/70  Pulse: 92 125 127 102  Temp: 97.7 F (36.5 C) 98 F (36.7 C) 99.1 F (37.3 C) 97.5 F (36.4 C)  TempSrc: Oral Oral Oral Oral  Resp: 18 20 20 17   Height:      Weight:    78 kg (171 lb 15.3 oz)  SpO2: 99% 100% 100% 98%   Weight change: -3.6 kg (-7 lb 15 oz)  Intake/Output Summary (Last 24 hours) at 11/03/11 0738 Last data filed at 11/03/11 0010  Gross per 24 hour  Intake 1214.58 ml  Output    400 ml  Net 814.58 ml   Labs: Basic Metabolic Panel:  Lab 11/03/11 9562 11/02/11 0520 11/01/11 1151  NA 142 142 140  K 3.5 3.2* 3.6  CL 106 102 101  CO2 24 25 25   GLUCOSE 158* 139* 121*  BUN 95* 114* 124*  CREATININE 2.68* 3.12* 3.14*  CALCIUM 8.7 8.8 9.3  ALB -- -- --  PHOS 4.8* -- --   Liver Function Tests:  Lab 11/03/11 0347 11/01/11 1151  AST -- 13  ALT -- 13  ALKPHOS -- 53  BILITOT -- 0.2*  PROT -- 6.7  ALBUMIN 2.5* 2.9*   No results found for this basename: LIPASE:3,AMYLASE:3 in the last 168 hours No results found for this basename: AMMONIA:3 in the last 168 hours CBC:  Lab 11/02/11 0520 11/01/11 1706 11/01/11 1626 11/01/11 1151  WBC 7.1 6.8 5.9 --  NEUTROABS -- -- -- --  HGB 8.8* 7.2* 6.8* --  HCT 25.1* 21.4* 20.1* --  MCV 89.6 91.5 91.8 91.0  PLT 129* 152 138* --   Cardiac Enzymes: No results found for this basename: CKTOTAL:5,CKMB:5,CKMBINDEX:5,TROPONINI:5 in the last 168 hours CBG:  Lab 11/02/11 1139 11/02/11 0749 11/01/11 2151 11/01/11 1652 11/01/11 1118  GLUCAP 95 118* 206* 177* 119*    Iron Studies: No results found for this basename: IRON,TIBC,TRANSFERRIN,FERRITIN in the last 72 hours Studies/Results: Ir Fluoro Guide Cv Line  Right  11/02/2011  *RADIOLOGY REPORT*  Clinical data: GI bleed, coagulopathic, no venous access.  RIGHT IJ CENTRAL VENOUS CATHETER PLACEMENT UNDER ULTRASOUND AND FLUOROSCOPIC GUIDANCE:  Technique and findings: The procedure, risks (including but not limited to bleeding, infection, organ damage), benefits, and alternatives were explained to the patient.  Questions regarding the procedure were encouraged and answered.  The patient understands and consents to the procedure.  Patency of the right IJ vein was confirmed with ultrasound with image documentation. An appropriate skin site was determined. Skin site was marked. Region was prepped using maximum barrier technique including cap and mask, sterile gown, sterile gloves, large sterile sheet, and Chlorhexidine   as cutaneous antisepsis.  The region was infiltrated locally with 1% lidocaine.   Under real-time ultrasound guidance, the right IJ vein was accessed with a 21 gauge micropuncture needle; the needle tip within the vein was confirmed with ultrasound image documentation.  The  needle exchanged over a guidewire for peel-away sheath through which an 15 cm 6-French  PowerPICC triple lumen catheter was advanced. This was positioned with the tip near the SVC/RA junction. Spot chest radiograph shows good positioning and no pneumothorax. Catheter was flushed and  sutured externally with 0-Prolene sutures. Patient tolerated the procedure well, with no immediate complication.  IMPRESSION: 1. Technically successful right IJ triple lumen power injectable central venous catheter placement.   Original Report Authenticated By: Osa Craver, M.D.    Ir US Guide Vasc Access Right  11/02/2011  *RADIOLOGY REPORT*  Clinical data: GI bleed, coagulopathic, no venous access.  RIGHT IJ CENTRAL VENOUS CATHETER PLACEMENT UNDER ULTRASOUND AND FLUOROSCOPIC GUIDANCE:  Technique and findings: The procedure, risks (including but not limited to bleeding, infection, organ damage),  benefits, and alternatives were explained to the patient.  Questions regarding the procedure were encouraged and answered.  The patient understands and consents to the procedure.  Patency of the right IJ vein was confirmed with ultrasound with image documentation. An appropriate skin site was determined. Skin site was marked. Region was prepped using maximum barrier technique including cap and mask, sterile gown, sterile gloves, large sterile sheet, and Chlorhexidine   as cutaneous antisepsis.  The region was infiltrated locally with 1% lidocaine.   Under real-time ultrasound guidance, the right IJ vein was accessed with a 21 gauge micropuncture needle; the needle tip within the vein was confirmed with ultrasound image documentation.  The  needle exchanged over a guidewire for peel-away sheath through which an 15 cm 6-French  PowerPICC triple lumen catheter was advanced. This was positioned with the tip near the SVC/RA junction. Spot chest radiograph shows good positioning and no pneumothorax. Catheter was flushed and sutured externally with 0-Prolene sutures. Patient tolerated the procedure well, with no immediate complication.  IMPRESSION: 1. Technically successful right IJ triple lumen power injectable central venous catheter placement.   Original Report Authenticated By: Osa Craver, M.D.    Medications: Infusions:    . sodium chloride 125 mL/hr at 11/03/11 0600  . DISCONTD: sodium chloride      Scheduled Medications:    . carvedilol  12.5 mg Oral BID WC  . desmopressin (DDAVP) IV  20 mcg Intravenous Once  . diltiazem  120 mg Oral Daily  . loratadine  10 mg Oral Daily  . metoprolol      . metoprolol  2.5 mg Intravenous Q6H  . pantoprazole  40 mg Oral Q0600  . potassium chloride  10 mEq Intravenous Q1 Hr x 3  . potassium chloride      . sodium chloride  10-40 mL Intracatheter Q12H  . DISCONTD: pantoprazole (PROTONIX) IV  40 mg Intravenous Q12H    have reviewed scheduled and prn  medications.  Physical Exam: General: alert, NAD.  Heart: slightly tachy Lungs: mostly clear to auscultation Abdomen: soft, nontender Extremities: no peripheral edema, but slight dependent edema   Assessment/ Plan: Pt is a 78 y.o. yo female who was admitted on 11/01/2011 with  GIB and acute on chronic renal failure in the setting of daily NSAIDS and ACE Assessment/Plan: 1. GIB- possibly due to NSAIDS.  EGD did not show any active bleeding.  Is on protonix, no more NSAIDS.  No CBC this AM but clinically seems improved 2. Acute on CKD- possibly due to NSAIDS as well.  Improved since admission  Avoiding any further insults to kidney function.  UOP not well recorded but I think that is due to her incontinence and the fact that she is eliminating bowel and urine at the same time making it difficult to quantify.  3. Anemia- acute GIB giving ABL anemia. S/p transfusion and EGD. i gave DDAVP yesterday for uremic platelets.  Iron profile shows severe  iron deficiency.  Would recommend nulecit 125 mg times 2 doses while here in the hospital 4. Secondary hyperparathyroidism- PTH pending this AM 5. HTN/volume- I think now that tank is full.  I will d/c IVF and would encourage starting diet, mobilizing and moving to regular floor bed. 6. Tachy- I assume this is pts normal state and will need to have her home meds restarted with the exception of her ACE  Okla Qazi A   11/03/2011,7:38 AM  LOS: 2 days

## 2011-11-03 NOTE — Progress Notes (Signed)
Pt c/o of mid sternal chest pain with pressure, rated it as a "medium" when asked pt agreed a 5/10. BP 115/62, HR 82, O2 sat's 98% on 2L [place]. Pt denies shortness of breathe and not diaphoretic, EKG completed. Dr. Claiborne Billings notified, MD to round on pt. Will continue to monitor pt.

## 2011-11-03 NOTE — Evaluation (Signed)
Physical Therapy Evaluation Patient Details Name: [name] MRN: [id number] DOB: 11-May-1930 Today's Date: 11/03/2011 Time: 4098-1191 PT Time Calculation (min): 30 min  PT Assessment / Plan / Recommendation Clinical Impression  Pt adm with GI bleed.  Needs skilled PT to maximize I and safety so pt may eventually return home.  Several comments by pt indicate husband not available to assist at home.  Recommend ST-SNF.    PT Assessment  Patient needs continued PT services    Follow Up Recommendations  Skilled nursing facility    Barriers to Discharge        Equipment Recommendations  None recommended by PT    Recommendations for Other Services     Frequency Min 3X/week    Precautions / Restrictions Precautions Precautions: Fall Restrictions Weight Bearing Restrictions: No   Pertinent Vitals/Pain VSS      Mobility  Bed Mobility Bed Mobility: Supine to Sit;Sitting - Scoot to Edge of Bed Supine to Sit: 5: Supervision;HOB elevated Sitting - Scoot to Edge of Bed: 5: Supervision Transfers Transfers: Sit to Stand;Stand to [employer] Transfers Sit to Stand: 4: Min assist;With upper extremity assist;From bed;From chair/3-in-1;With armrests Stand to Sit: 4: Min assist;With upper extremity assist;To chair/3-in-1 Stand Pivot Transfers: 4: Min assist Details for Transfer Assistance: Verbal cues for hand placement. Ambulation/Gait Ambulation/Gait Assistance: 4: Min assist (+1 for lines) Ambulation Distance (Feet): 50 Feet Assistive device: Rolling walker Ambulation/Gait Assistance Details: verbal cues to look up and stand more erect. Gait Pattern: Step-through pattern;Decreased stride length;Trunk flexed Gait velocity: decr    Exercises     PT Diagnosis: Difficulty walking;Generalized weakness  PT Problem List: Decreased strength;Decreased activity tolerance;Decreased balance;Decreased mobility;Decreased knowledge of precautions;Decreased knowledge of use of DME PT  Treatment Interventions: DME instruction;Gait training;Functional mobility training;Patient/family education;Therapeutic activities;Therapeutic exercise;Balance training   PT Goals Acute Rehab PT Goals PT Goal Formulation: With patient Time For Goal Achievement: 11/17/11 Potential to Achieve Goals: Good Pt will go Sit to Stand: with modified independence PT Goal: Sit to Stand - Progress: Goal set today Pt will go Stand to Sit: with modified independence PT Goal: Stand to Sit - Progress: Goal set today Pt will Ambulate: 51 - 150 feet;with supervision PT Goal: Ambulate - Progress: Goal set today  Visit Information  Last PT Received On: 11/03/11 Assistance Needed: +2 (lines)    Subjective Data  Subjective: "I push myself." Patient Stated Goal: Walk on her own again.   Prior Functioning  Home Living Lives With: Spouse Available Help at Discharge:  (pt alone during most of day) Type of Home: Mobile home Home Access: Stairs to enter [employer] of Steps: 3-4 Entrance Stairs-Rails: Right Home Layout: One level Bathroom Shower/Tub: Tub only Firefighter: Standard Home Adaptive Equipment:  (oxygen) Prior Function Level of Independence: Independent with assistive device(s) (uses walker when out) Driving: No    Cognition  Overall Cognitive Status: Appears within functional limits for tasks assessed/performed Arousal/Alertness: Awake/alert Orientation Level: Appears intact for tasks assessed Behavior During Session: [facility] for tasks performed    Extremity/Trunk Assessment Right Lower Extremity Assessment RLE ROM/Strength/Tone: Deficits RLE ROM/Strength/Tone Deficits: grossly 3+/5 Left Lower Extremity Assessment LLE ROM/Strength/Tone: Deficits LLE ROM/Strength/Tone Deficits: grossly 3+/5   Balance Static Standing Balance Static Standing - Balance Support: During functional activity;Bilateral upper extremity supported Static Standing - Level of Assistance: 4: Min  assist  End of Session PT - End of Session Equipment Utilized During Treatment: Gait belt Activity Tolerance: Patient tolerated treatment well Patient left: in chair;with call  bell/phone within reach Nurse Communication: Mobility status  GP     [facility] 11/03/2011, 10:26 AM  [facility] PT [phone]

## 2011-11-04 DIAGNOSIS — I4891 Unspecified atrial fibrillation: Secondary | ICD-10-CM

## 2011-11-04 DIAGNOSIS — K625 Hemorrhage of anus and rectum: Secondary | ICD-10-CM

## 2011-11-04 LAB — CBC
HCT: 23.7 % — ABNORMAL LOW (ref 36.0–46.0)
Hemoglobin: 8.1 g/dL — ABNORMAL LOW (ref 12.0–15.0)
MCH: 31.3 pg (ref 26.0–34.0)
MCH: 30.2 pg (ref 26.0–34.0)
MCHC: 34.2 g/dL (ref 30.0–36.0)
MCHC: 33.2 g/dL (ref 30.0–36.0)
MCV: 91.5 fL (ref 78.0–100.0)
MCV: 91.1 fL (ref 78.0–100.0)
Platelets: 111 10*3/uL — ABNORMAL LOW (ref 150–400)
RBC: 2.48 MIL/uL — ABNORMAL LOW (ref 3.87–5.11)

## 2011-11-04 LAB — RENAL FUNCTION PANEL
Albumin: 2.6 g/dL — ABNORMAL LOW (ref 3.5–5.2)
BUN: 78 mg/dL — ABNORMAL HIGH (ref 6–23)
Calcium: 9.5 mg/dL (ref 8.4–10.5)
GFR calc Af Amer: 21 mL/min — ABNORMAL LOW (ref >90)
Glucose, Bld: 139 mg/dL — ABNORMAL HIGH (ref 70–99)
Phosphorus: 4.2 mg/dL (ref 2.3–4.6)
Potassium: 3.6 mEq/L (ref 3.5–5.1)
Sodium: 143 mEq/L (ref 135–145)

## 2011-11-04 LAB — TROPONIN I: Troponin I: <0.3 ng/mL (ref <0.30)

## 2011-11-04 LAB — GLUCOSE, CAPILLARY
Glucose-Capillary: 222 mg/dL — ABNORMAL HIGH (ref 70–99)
Glucose-Capillary: 150 mg/dL — ABNORMAL HIGH (ref 70–99)

## 2011-11-04 MED ORDER — SODIUM CHLORIDE 0.9 % IV SOLN
125.0000 mg | Freq: Once | INTRAVENOUS | Status: AC
  Administered 2011-11-04: 125 mg via INTRAVENOUS
  Filled 2011-11-04: qty 10

## 2011-11-04 MED ORDER — ALTEPLASE 100 MG IV SOLR
2.0000 mg | Freq: Once | INTRAVENOUS | Status: DC
  Filled 2011-11-04: qty 2

## 2011-11-04 MED ORDER — ALTEPLASE 100 MG IV SOLR
2.0000 mg | Freq: Once | INTRAVENOUS | Status: AC
  Administered 2011-11-04: 2 mg
  Filled 2011-11-04: qty 2

## 2011-11-04 NOTE — Progress Notes (Signed)
FMTS Attending Note Patient seen and examined by me, discussed with Dr Birdie Sons and I agree with his assessment/plan. Patient reports resolution of her chest pain with single unit PRBC transfusion. Her Hgb has responded appropriately as well.  Plan to recheck ECG.  Follow Hgb for further drop.  If so, then may need to consider reconsulting GI for consideration of colonoscopy. Patient's indication for warfarin was AF (secondary stroke prevention); for discontinuation of warfarin at this point, to address with her primary cardiologist.  Paula Compton, MD

## 2011-11-04 NOTE — Progress Notes (Signed)
PROGRESS NOTE  Subjective:   Primary cardiologist:  [name], MD  [name] is a 78 year old woman with an extensive past medical history, nonobstructive coronary disease, atrial fibrillation on chronic anticoagulation, and non-ischemic cardiomyopathy (records refer to hypertrophic echo appearance) who was admitted to the [facility] Medicine service with rectal bleeding and anemia.   Her hospital course thus far has been notable for multiple transfusions with recurrent drops in Hgb. Upper endoscopy revealed non-bleeding ulcers. Colonoscopy has not been performed during this hospitalization. She has also been managed for acute on chronic kidney failure.  Last night, the patient awoke and complained of substernal chest pain. An ECG was performed and we were then asked to see the patient in consultation to offer any additional management recommendations.   She is feeling better this am.  She is still having some rectal bleeding  Objective:    Vital Signs:   Temp:  [98.1 F (36.7 C)-99 F (37.2 C)] 98.5 F (36.9 C) (09/22 0700) Pulse Rate:  [60-90] 84  (09/22 0723) Resp:  [15-27] 17  (09/22 0723) BP: (111-134)/(41-65) 131/64 mmHg (09/22 0723) SpO2:  [90 %-99 %] 94 % (09/22 0723) Weight:  [182 lb 12.2 oz (82.9 kg)] 182 lb 12.2 oz (82.9 kg) (09/22 0500)  Last BM Date: 11/02/11   24-hour weight change: Weight change: 10 lb 12.8 oz (4.9 kg)  Weight trends: Filed Weights   11/02/11 0356 11/03/11 0425 11/04/11 0500  Weight: 172 lb 6.4 oz (78.2 kg) 171 lb 15.3 oz (78 kg) 182 lb 12.2 oz (82.9 kg)    Intake/Output:  09/21 0701 - 09/22 0700 In: 1240 [P.O.:780; I.V.:110; Blood:350] Out: -      Physical Exam: BP 131/64  Pulse 84  Temp 98.5 F (36.9 C) (Oral)  Resp 17  Ht 5\' 5"  (1.651 m)  Wt 182 lb 12.2 oz (82.9 kg)  BMI 30.41 kg/m2  SpO2 94%  General: Vital signs reviewed and noted. Well-developed, well-nourished, in no acute distress; alert, appropriate and cooperative .    Head: Normocephalic, facial bruising  Eyes: conjunctivae/corneas clear.  EOM's intact.   Throat: normal  Neck: Supple. Normal carotids. No JVD  Lungs:  Clear to auscultation  Heart: Irregularly irregular,  With normal  S1 S2. 1-2/6 systolic murmur  Abdomen:  Soft, non-tender, non-distended with normoactive bowel sounds. No hepatomegaly. No rebound/guarding. No abdominal masses.  Extremities: Distal pedal pulses are 2+ .  No edema.    Neurologic: A&O X3, CN II - XII are grossly intact. Motor strength is 5/5 in the all 4 extremities.  Psych: Responds to questions appropriately with normal affect.    Labs: BMET:  Basename 11/04/11 0340 11/03/11 0347  NA 143 142  K 3.6 3.5  CL 107 106  CO2 25 24  GLUCOSE 139* 158*  BUN 78* 95*  CREATININE 2.38* 2.68*  CALCIUM 9.5 8.7  MG -- --  PHOS 4.2 4.8*    Liver function tests:  Basename 11/04/11 0340 11/03/11 0347 11/01/11 1151  AST -- -- 13  ALT -- -- 13  ALKPHOS -- -- 53  BILITOT -- -- 0.2*  PROT -- -- 6.7  ALBUMIN 2.6* 2.5* --   No results found for this basename: LIPASE:2,AMYLASE:2 in the last 72 hours  CBC:  Basename 11/04/11 0805 11/03/11 1545  WBC 6.8 6.5  NEUTROABS -- --  HGB 8.1* 7.1*  HCT 23.7* 21.5*  MCV 91.5 93.1  PLT 111* 126*    Cardiac Enzymes:  Basename 11/04/11  0340 11/03/11 2100  CKTOTAL -- --  CKMB -- --  TROPONINI <0.30 <0.30    Coagulation Studies:  Basename 11/03/11 0701 Nov 07, 2011 0717 11/01/11 1151 11/01/11 0929  LABPROT 16.9* 20.2* 69.5* --  INR 1.41 1.79* 9.51* >8.0    Other: No components found with this basename: POCBNP:3 No results found for this basename: DDIMER in the last 72 hours  Basename 11/01/11 0910  HGBA1C 7.1   No results found for this basename: CHOL,HDL,LDLCALC,TRIG,CHOLHDL in the last 72 hours No results found for this basename: TSH,T4TOTAL,FREET3,T3FREE,THYROIDAB in the last 72 hours  Basename 11/03/11 0347  VITAMINB12 --  FOLATE --  FERRITIN 214  TIBC 210*   IRON 23*  RETICCTPCT --     Tele: Sept. 22, 2013 --  Atrial fib, rate of 90   Medications:    Infusions:    . sodium chloride 20 mL/hr at 11/03/11 1800    Scheduled Medications:    . alteplase  2 mg Intracatheter Once  . alteplase  2 mg Intracatheter Once  . carvedilol  12.5 mg Oral BID WC  . diltiazem  120 mg Oral Daily  . ferric gluconate (FERRLECIT/NULECIT) IV  125 mg Intravenous Once  . loratadine  10 mg Oral Daily  . metoprolol  2.5 mg Intravenous Q6H  .  morphine injection  2 mg Intravenous Once  . morphine      . pantoprazole  40 mg Oral Q0600  . sodium chloride  10-40 mL Intracatheter Q12H  . DISCONTD: ferric gluconate (FERRLECIT/NULECIT) IV  125 mg Intravenous Once    Assessment/ Plan:    1. Atrial fibrillation:  She has had a major GI bleed on coumadin and I would agree with holding for now.  She has had a stroke and is at increased risk for another stroke.  Will have Dr. Shirlee Latch weigh in on the discussion of if/When to restart coumadin.  Given the severity of this GI bleed, we may not be able to safely have her on anticoagulation.    2. MR  3. Pulmonary Hypertensino  4. Chronic Renal insufficiency.   Disposition: plans per primary medical team.  Length of Stay: 3  [name], [name]., MD, [facility] 11/04/2011, 9:04 AM Office [phone] Pager [phone]

## 2011-11-04 NOTE — Progress Notes (Signed)
Subjective:  Chest pain yesterday w/ concerning EKG changes, but negative troponin - Cardiology consulted and recommended more aggressive treatment of anemia b/c not a candidate for anti-coagulation; Given 1 U PRBC's overnight and AM H&H pending; Currently denies CP but feels "groggy" and having trouble with focusing her vision; Still passing blood per rectum.  UOP again not recorded because patient is incontinent.   Objective Vital signs in last 24 hours: Filed Vitals:   11/04/11 0120 11/04/11 0300 11/04/11 0500 11/04/11 0700  BP: 115/62 134/62    Pulse: 89 60    Temp: 98.9 F (37.2 C) 98.4 F (36.9 C)  98.5 F (36.9 C)  TempSrc: Oral Oral  Oral  Resp: 21 27    Height:      Weight:   182 lb 12.2 oz (82.9 kg)   SpO2: 93% 95%     Weight change: 10 lb 12.8 oz (4.9 kg)  Intake/Output Summary (Last 24 hours) at 11/04/11 0801 Last data filed at 11/04/11 0120  Gross per 24 hour  Intake   1240 ml  Output      0 ml  Net   1240 ml   Labs: Basic Metabolic Panel:  Lab 11/04/11 1610 11/03/11 0347 11/02/11 0520  NA 143 142 142  K 3.6 3.5 3.2*  CL 107 106 102  CO2 25 24 25   GLUCOSE 139* 158* 139*  BUN 78* 95* 114*  CREATININE 2.38* 2.68* 3.12*  CALCIUM 9.5 8.7 8.8  ALB -- -- --  PHOS 4.2 4.8* --   Liver Function Tests:  Lab 11/04/11 0340 11/03/11 0347 11/01/11 1151  AST -- -- 13  ALT -- -- 13  ALKPHOS -- -- 53  BILITOT -- -- 0.2*  PROT -- -- 6.7  ALBUMIN 2.6* 2.5* 2.9*   No results found for this basename: LIPASE:3,AMYLASE:3 in the last 168 hours No results found for this basename: AMMONIA:3 in the last 168 hours CBC:  Lab 11/03/11 1545 11/03/11 0701 11/02/11 0520 11/01/11 1706 11/01/11 1626  WBC 6.5 6.8 7.1 -- --  NEUTROABS -- -- -- -- --  HGB 7.1* 7.2* 8.8* -- --  HCT 21.5* 21.9* 25.1* -- --  MCV 93.1 92.4 89.6 91.5 91.8  PLT 126* 115* 129* -- --   Cardiac Enzymes:  Lab 11/04/11 0340 11/03/11 2100  CKTOTAL -- --  CKMB -- --  CKMBINDEX -- --  TROPONINI <0.30  <0.30   CBG:  Lab 11/03/11 2146 11/03/11 1703 11/03/11 1126 11/03/11 0740 11/02/11 1139  GLUCAP 144* 116* 234* 122* 95    Iron Studies:   Basename 11/03/11 0347  IRON 23*  TIBC 210*  TRANSFERRIN --  FERRITIN 214   Studies/Results: No results found. Medications: Infusions:    . sodium chloride 20 mL/hr at 11/03/11 1800    Scheduled Medications:    . carvedilol  12.5 mg Oral BID WC  . diltiazem  120 mg Oral Daily  . ferric gluconate (FERRLECIT/NULECIT) IV  125 mg Intravenous Once  . loratadine  10 mg Oral Daily  . metoprolol  2.5 mg Intravenous Q6H  .  morphine injection  2 mg Intravenous Once  . morphine      . pantoprazole  40 mg Oral Q0600  . sodium chloride  10-40 mL Intracatheter Q12H  . DISCONTD: ferric gluconate (FERRLECIT/NULECIT) IV  125 mg Intravenous Once    have reviewed scheduled and prn medications.  Physical Exam: General: alert, ill appearing, elderly WF.  Heart: irregularly irregular, rate controlled Lungs: mostly clear  to auscultation, poor respiratory effort Abdomen: soft, nontender Extremities: no peripheral edema, but slight dependent edema   Assessment/ Plan: Pt is a 78 y.o. yo female who was admitted on 11/01/2011 with  GIB and acute on chronic renal failure in the setting of daily NSAIDS and ACE Assessment/Plan: 1. GIB- possibly due to NSAIDS.  EGD did not show any active bleeding.  Is on protonix, no more NSAIDS.  Worsening hgb to 7.1 yesterday, will likely need another transfusion; Will have colonoscopy during this admission- all per primary team 2. Acute on CKD- possibly due to NSAIDS as well.  Improved since admission  Avoiding any further insults to kidney function.  UOP not well recorded but I think that is due to her incontinence and the fact that she is eliminating bowel and urine at the same time making it difficult to quantify. Given improving Creatinine and BUN, no intervention now except to prevent further injury by maintaing BP  and correcting anemia. Back to baseline renal function 3. Anemia- acute GIB giving ABL anemia. S/p transfusion and EGD. i gave DDAVP yesterday for uremic platelets.  Iron profile shows severe iron deficiency. S/p 1 dose of nulecit 124 mg on 9/21. Recommend another dose of 125 mg today. agree 4. Secondary hyperparathyroidism- PTH pending this AM 5. HTN/volume- I think now that tank is full.  I will d/c IVF and would encourage starting diet, mobilizing and moving to regular floor bed. 6. Tachy- I assume this is pts normal state and will need to have her home meds restarted with the exception of her ACE  WILLIAMSON, EDWARD   11/04/2011,8:01 AM  LOS: 3 days   Patient seen and examined, agree with above note with above modifications. Pleasant woman with acute on chronic renal failure in the setting of NSAIDS and GIB giving hypotension on an ACE.  Kidnay function back to baseline, assume good UOP given euvolemic status and improvement in renal function. Renal will sign off, call with any questions.  Annie Sable, MD 11/04/2011

## 2011-11-04 NOTE — Progress Notes (Signed)
Patient ID: JENESYS CASSEUS, female   DOB: 05-24-1930, 78 y.o.   MRN: [id number]          Family Medicine Teaching Service [facility] Progress Note     Patient name: [name] Medical record number: [id number] Date of birth: 04-Oct-1930 Age: 78 y.o. Gender: female    LOS: 3 days   Primary Care Provider: Sanjuana Letters, MD  Overnight Events: Continues to have some dark stools, though states this is much improved from yesterday. Had chest pain yesterday evening with EKG changes.  Troponins cycled and negative. No current complaints of chest pain.  Objective: Vital signs in last 24 hours: BP 134/62  Pulse 60  Temp 98.5 F (36.9 C) (Oral)  Resp 27  Ht 5\' 5"  (1.651 m)  Wt 182 lb 12.2 oz (82.9 kg)  BMI 30.41 kg/m2  SpO2 95%    Physical Exam: Gen: NAD. Extremely pleasant women. CV: RRR. No murmurs, gallops, or rubs  Res: Clear breath sounds. No rales. No wheezes. Abd: Soft. Non tender. Non distended. Ext/Musc: No edema Neuro: grossly intact.  Labs/Studies:  Results for orders placed during the hospital encounter of 11/01/11 (from the past 24 hour(s))  URINALYSIS, MICROSCOPIC ONLY     Status: Abnormal   Collection Time   11/03/11 10:27 AM      Component Value Range   Color, Urine YELLOW  YELLOW   APPearance CLOUDY (*) CLEAR   Specific Gravity, Urine 1.012  1.005 - 1.030   pH 5.5  5.0 - 8.0   Glucose, UA NEGATIVE  NEGATIVE mg/dL   Hgb urine dipstick LARGE (*) NEGATIVE   Bilirubin Urine NEGATIVE  NEGATIVE   Ketones, ur NEGATIVE  NEGATIVE mg/dL   Protein, ur 30 (*) NEGATIVE mg/dL   Urobilinogen, UA 0.2  0.0 - 1.0 mg/dL   Nitrite NEGATIVE  NEGATIVE   Leukocytes, UA LARGE (*) NEGATIVE   WBC, UA 21-50  <3 WBC/hpf   RBC / HPF 21-50  <3 RBC/hpf   Bacteria, UA MANY (*) RARE   Squamous Epithelial / LPF RARE  RARE  GLUCOSE, CAPILLARY     Status: Abnormal   Collection Time   11/03/11 11:26 AM      Component Value Range   Glucose-Capillary 234 (*) 70 - 99 mg/dL   Comment 1  Documented in Chart     Comment 2 Notify RN    CBC     Status: Abnormal   Collection Time   11/03/11  3:45 PM      Component Value Range   WBC 6.5  4.0 - 10.5 K/uL   RBC 2.31 (*) 3.87 - 5.11 MIL/uL   Hemoglobin 7.1 (*) 12.0 - 15.0 g/dL   HCT 78.2 (*) 95.6 - 21.3 %   MCV 93.1  78.0 - 100.0 fL   MCH 30.7  26.0 - 34.0 pg   MCHC 33.0  30.0 - 36.0 g/dL   RDW 08.6 (*) 57.8 - 46.9 %   Platelets 126 (*) 150 - 400 K/uL  GLUCOSE, CAPILLARY     Status: Abnormal   Collection Time   11/03/11  5:03 PM      Component Value Range   Glucose-Capillary 116 (*) 70 - 99 mg/dL   Comment 1 Documented in Chart     Comment 2 Notify RN    PREPARE RBC (CROSSMATCH)     Status: Normal   Collection Time   11/03/11  8:40 PM      Component Value Range  Order Confirmation ORDER PROCESSED BY BLOOD BANK    TROPONIN I     Status: Normal   Collection Time   11/03/11  9:00 PM      Component Value Range   Troponin I <0.30  <0.30 ng/mL  GLUCOSE, CAPILLARY     Status: Abnormal   Collection Time   11/03/11  9:46 PM      Component Value Range   Glucose-Capillary 144 (*) 70 - 99 mg/dL   Comment 1 Documented in Chart     Comment 2 Notify RN    RENAL FUNCTION PANEL     Status: Abnormal   Collection Time   11/04/11  3:40 AM      Component Value Range   Sodium 143  135 - 145 mEq/L   Potassium 3.6  3.5 - 5.1 mEq/L   Chloride 107  96 - 112 mEq/L   CO2 25  19 - 32 mEq/L   Glucose, Bld 139 (*) 70 - 99 mg/dL   BUN 78 (*) 6 - 23 mg/dL   Creatinine, Ser 2.95 (*) 0.50 - 1.10 mg/dL   Calcium 9.5  8.4 - 28.4 mg/dL   Phosphorus 4.2  2.3 - 4.6 mg/dL   Albumin 2.6 (*) 3.5 - 5.2 g/dL   GFR calc non Af Amer 18 (*) >90 mL/min   GFR calc Af Amer 21 (*) >90 mL/min  TROPONIN I     Status: Normal   Collection Time   11/04/11  3:40 AM      Component Value Range   Troponin I <0.30  <0.30 ng/mL  CBC     Status: Abnormal   Collection Time   11/04/11  8:05 AM      Component Value Range   WBC 6.8  4.0 - 10.5 K/uL   RBC 2.59 (*)  3.87 - 5.11 MIL/uL   Hemoglobin 8.1 (*) 12.0 - 15.0 g/dL   HCT 13.2 (*) 44.0 - 10.2 %   MCV 91.5  78.0 - 100.0 fL   MCH 31.3  26.0 - 34.0 pg   MCHC 34.2  30.0 - 36.0 g/dL   RDW 72.5 (*) 36.6 - 44.0 %   Platelets 111 (*) 150 - 400 K/uL     Medications: Scheduled Meds:    . alteplase  2 mg Intracatheter Once  . alteplase  2 mg Intracatheter Once  . carvedilol  12.5 mg Oral BID WC  . diltiazem  120 mg Oral Daily  . ferric gluconate (FERRLECIT/NULECIT) IV  125 mg Intravenous Once  . loratadine  10 mg Oral Daily  . metoprolol  2.5 mg Intravenous Q6H  .  morphine injection  2 mg Intravenous Once  . morphine      . pantoprazole  40 mg Oral Q0600  . sodium chloride  10-40 mL Intracatheter Q12H  . DISCONTD: ferric gluconate (FERRLECIT/NULECIT) IV  125 mg Intravenous Once   Continuous Infusions:    . sodium chloride 20 mL/hr at 11/03/11 1800   PRN Meds:.sodium chloride, acetaminophen, acetaminophen, nitroGLYCERIN, sodium chloride, traZODone  Echo: 55-60% EF   Assessment/Plan: [name] is a 78 y.o. year old female with a history of DMII, HLD, HTN, MI, afib on coumadin, and CKD IV who presents with a 3-4 week history of rectal bleeding and recent increase in bruising.   1. Rectal bleeding: patient with history of diverticulosis. Differential also includes PUD, angiodysplasia, AVM, neoplastic cause. INR supratherapeutic at 9.51 at admission. Hb of 7.6 at time of admission that  dropped to 6.8.  - HgB: 8.1 up from 7.1 following transfusion-will recheck CBC this afternoon at 4 pm - INR: 1.41-will allow to stay subtherapeutic as risk of continued GI bleed outweighs risk of stroke in this setting - Central line placed  - typed and crossed-may need to consider transfusion if patient becomes symptomatic or drops below 8 Hb. - GI consulted recs continue PPI, avoid NSAIDs, treat H. Pylori if positive. GI has signed off. - Last bloody stool: today-continues to have dark tarry stools -  EGD: stomach with antral erosions and small clean based ulcer without bleeding.  - Follow Potassium, consider replacement if no wnl-currently 3.6 - possibly need to contact GI again if patient has continued bleeding  2. Supratherapeutic INR: With PO vit K 2.5 mg and IV vit K 10 mg. Last INR 1.41. Will continue to hold coumadin as patient continues to have drop in Hb.  3. DM: glucose on admission 121. Will consider SSI if glucose over 200. Held home glipizide.   4. HTN: BP currently 134/62. Difficult to appreciate how much of this low normal BP is due to blood loss and how much of this is due to restarting of medications, likely is a combination of both.  Will continue to watch hemodynamic status. Coreg 12.5 and dilt 120 started. Metoprolol 2.5 continued.   5. Afib: patient with pacemaker in place. Pulse currently 89. Rate controlled on dilt 120.  Could consider going up on this if we think tachycardia is completely attributable to afib, as opposed to a sign of volume depletion.  6: CKD stage IV: GFR of 13 on admission. Creatinine currently 2.38 so back around baseline. Pt's baseline around 2.3. Pt CO2 and K wnl. Consulted with nephrology who recs DDAVP, anemia panel, and PTH. Anemia panel reveals mixed picture, likely components of iron deficiency and anemia of chronic disease.  Also recs nulecit 125 mg for 2 doses while in hospital.  Given second dose today.  7. Chest pain: resolved. Ischemia likely resulting from anemia. Cards consulted and recommend correcting anemia, state with current bleeding patient is not a candidate for anticoagulation therapy.  Will maintain Hb greater than 8.   8. FEN/GI: - kvo - heart healthy diet   9. Prophylaxis: SCDs, patient is actively bleeding no anticoagulation; currently off. Loratadine for itch.  Sleep aide: trazodone QHS PRN  10. Dispo: pending improvement in bleeding and stable hemodynamically  Marikay Alar, MD PGY1, FPTS

## 2011-11-05 ENCOUNTER — Encounter (HOSPITAL_COMMUNITY): Payer: Self-pay

## 2011-11-05 LAB — TYPE AND SCREEN
ABO/RH(D): O NEG
Unit division: 0
Unit division: 0

## 2011-11-05 LAB — BASIC METABOLIC PANEL
BUN: 59 mg/dL — ABNORMAL HIGH (ref 6–23)
Calcium: 9 mg/dL (ref 8.4–10.5)
GFR calc non Af Amer: 22 mL/min — ABNORMAL LOW (ref >90)
Glucose, Bld: 277 mg/dL — ABNORMAL HIGH (ref 70–99)
Sodium: 142 mEq/L (ref 135–145)

## 2011-11-05 LAB — CBC
HCT: 24.6 % — ABNORMAL LOW (ref 36.0–46.0)
Hemoglobin: 8.2 g/dL — ABNORMAL LOW (ref 12.0–15.0)
MCH: 30.6 pg (ref 26.0–34.0)
MCHC: 33.3 g/dL (ref 30.0–36.0)

## 2011-11-05 LAB — GLUCOSE, CAPILLARY
Glucose-Capillary: 231 mg/dL — ABNORMAL HIGH (ref 70–99)
Glucose-Capillary: 136 mg/dL — ABNORMAL HIGH (ref 70–99)

## 2011-11-05 LAB — URINALYSIS, ROUTINE W REFLEX MICROSCOPIC
Bilirubin Urine: NEGATIVE
Nitrite: NEGATIVE
Specific Gravity, Urine: 1.008 (ref 1.005–1.030)
Urobilinogen, UA: 0.2 mg/dL (ref 0.0–1.0)

## 2011-11-05 LAB — PARATHYROID HORMONE, INTACT (NO CA): PTH: 477 pg/mL — ABNORMAL HIGH (ref 14.0–72.0)

## 2011-11-05 LAB — URINE MICROSCOPIC-ADD ON

## 2011-11-05 MED ORDER — ATORVASTATIN CALCIUM 10 MG PO TABS
10.0000 mg | ORAL_TABLET | Freq: Every day | ORAL | Status: DC
  Administered 2011-11-05 – 2011-11-06 (×2): 10 mg via ORAL
  Filled 2011-11-05 (×3): qty 1

## 2011-11-05 MED ORDER — POTASSIUM CHLORIDE CRYS ER 20 MEQ PO TBCR
EXTENDED_RELEASE_TABLET | ORAL | Status: AC
  Filled 2011-11-05: qty 2

## 2011-11-05 MED ORDER — SIMVASTATIN 20 MG PO TABS
20.0000 mg | ORAL_TABLET | Freq: Every day | ORAL | Status: DC
  Filled 2011-11-05: qty 1

## 2011-11-05 MED ORDER — POTASSIUM CHLORIDE CRYS ER 20 MEQ PO TBCR
40.0000 meq | EXTENDED_RELEASE_TABLET | Freq: Two times a day (BID) | ORAL | Status: AC
  Administered 2011-11-05 (×2): 40 meq via ORAL
  Filled 2011-11-05: qty 2

## 2011-11-05 MED ORDER — TORSEMIDE 100 MG PO TABS
100.0000 mg | ORAL_TABLET | Freq: Every day | ORAL | Status: DC
  Administered 2011-11-05 – 2011-11-07 (×3): 100 mg via ORAL
  Filled 2011-11-05 (×3): qty 1

## 2011-11-05 NOTE — Progress Notes (Signed)
I discussed with  Dr Sonnenberg.  I agree with their plans documented in their progress note.  

## 2011-11-05 NOTE — Progress Notes (Signed)
Report given to Dena RN on 2000, to transfer to 2007 via wheelchair with oxygen,Santhosh Gulino Hyacinth Meeker RN

## 2011-11-05 NOTE — Progress Notes (Signed)
Patient ID: [name], female   DOB: 12-29-1930, 78 y.o.   MRN: [id number]          Family Medicine Teaching Service [facility] Progress Note     Patient name: [name] Medical record number: [id number] Date of birth: 05/27/1930 Age: 78 y.o. Gender: female    LOS: 4 days   Primary Care Provider: Sanjuana Letters, MD  Overnight Events: Last dark stool was Saturday night.  No more complaints of chest pain. No complaints of abdominal pain either.  Objective: Vital signs in last 24 hours: BP 125/66  Pulse 94  Temp 98.4 F (36.9 C) (Oral)  Resp 18  Ht 5\' 5"  (1.651 m)  Wt 174 lb 9.7 oz (79.2 kg)  BMI 29.06 kg/m2  SpO2 92%    Physical Exam: Gen: NAD. Extremely pleasant women. CV: RRR. No murmurs, gallops, or rubs  Res: Clear breath sounds. No rales. No wheezes. Abd: Soft. Non tender. Non distended. Ext/Musc: No edema  Labs/Studies:  Results for orders placed during the hospital encounter of 11/01/11 (from the past 24 hour(s))  TROPONIN I     Status: Normal   Collection Time   11/04/11  9:25 AM      Component Value Range   Troponin I <0.30  <0.30 ng/mL  GLUCOSE, CAPILLARY     Status: Abnormal   Collection Time   11/04/11 11:29 AM      Component Value Range   Glucose-Capillary 222 (*) 70 - 99 mg/dL   Comment 1 Documented in Chart     Comment 2 Notify RN    GLUCOSE, CAPILLARY     Status: Abnormal   Collection Time   11/04/11  4:41 PM      Component Value Range   Glucose-Capillary 150 (*) 70 - 99 mg/dL   Comment 1 Documented in Chart     Comment 2 Notify RN    CBC     Status: Abnormal   Collection Time   11/04/11  5:35 PM      Component Value Range   WBC 6.6  4.0 - 10.5 K/uL   RBC 2.48 (*) 3.87 - 5.11 MIL/uL   Hemoglobin 7.5 (*) 12.0 - 15.0 g/dL   HCT 62.9 (*) 52.8 - 41.3 %   MCV 91.1  78.0 - 100.0 fL   MCH 30.2  26.0 - 34.0 pg   MCHC 33.2  30.0 - 36.0 g/dL   RDW 24.4 (*) 01.0 - 27.2 %   Platelets 111 (*) 150 - 400 K/uL  GLUCOSE, CAPILLARY     Status:  Abnormal   Collection Time   11/04/11 10:06 PM      Component Value Range   Glucose-Capillary 142 (*) 70 - 99 mg/dL   Comment 1 Notify RN     Comment 2 Documented in Chart    CBC     Status: Abnormal   Collection Time   11/05/11  6:30 AM      Component Value Range   WBC 8.6  4.0 - 10.5 K/uL   RBC 2.68 (*) 3.87 - 5.11 MIL/uL   Hemoglobin 8.2 (*) 12.0 - 15.0 g/dL   HCT 53.6 (*) 64.4 - 03.4 %   MCV 91.8  78.0 - 100.0 fL   MCH 30.6  26.0 - 34.0 pg   MCHC 33.3  30.0 - 36.0 g/dL   RDW 74.2 (*) 59.5 - 63.8 %   Platelets 123 (*) 150 - 400 K/uL  GLUCOSE, CAPILLARY  Status: Abnormal   Collection Time   11/05/11  8:23 AM      Component Value Range   Glucose-Capillary 135 (*) 70 - 99 mg/dL   Comment 1 Notify RN       Medications: Scheduled Meds:    . alteplase  2 mg Intracatheter Once  . alteplase  2 mg Intracatheter Once  . carvedilol  12.5 mg Oral BID WC  . diltiazem  120 mg Oral Daily  . ferric gluconate (FERRLECIT/NULECIT) IV  125 mg Intravenous Once  . loratadine  10 mg Oral Daily  . pantoprazole  40 mg Oral Q0600  . simvastatin  20 mg Oral q1800  . sodium chloride  10-40 mL Intracatheter Q12H  . DISCONTD: metoprolol  2.5 mg Intravenous Q6H   Continuous Infusions:    . sodium chloride 20 mL/hr at 11/04/11 1800   PRN Meds:.sodium chloride, acetaminophen, acetaminophen, sodium chloride, traZODone  Echo: 55-60% EF   Assessment/Plan: [name] is a 78 y.o. year old female with a history of DMII, HLD, HTN, MI, afib on coumadin, and CKD IV who presents with a 3-4 week history of rectal bleeding and recent increase in bruising.   1. Rectal bleeding: patient with history of diverticulosis. Differential also includes PUD, angiodysplasia, AVM, neoplastic cause. INR supratherapeutic at 9.51 at admission. Hb of 7.6 at time of admission that dropped to 6.8.  - HgB: 8.2 this morning, stable from yesterday - INR: last check was 1.41-will allow to stay subtherapeutic as risk  of continued GI bleed outweighs risk of stroke in this setting - Central line placed  - GI consulted recs continue PPI, avoid NSAIDs. GI has signed off. - H. Pylori negative - Last bloody stool: Saturday evening - EGD: stomach with antral erosions and small clean based ulcer without bleeding.  - Follow Potassium, consider replacement if no wnl-currently 3.6 - will transfer to floor given stable vitals and no evidence of current bleed - will recheck CBC in am-if stable consider sending home tomorrow  2. Supratherapeutic INR: With PO vit K 2.5 mg and IV vit K 10 mg. Last INR 1.41. Will continue to hold coumadin as patient continues to have drop in Hb.  Consider restarting anticoagulation in 2 weeks from the bleeding event.  Given patients history of poor follow-up recently would recommend against coumadin and consider other options.  3. DM: glucose on admission 121.  Stable in the 130's-150's. Will consider SSI if glucose over 200. Held home glipizide.   4. HTN: BP currently 125/66.  BP currently stable.  Will continue to watch hemodynamic status. Coreg 12.5 and dilt 120 started. Metoprolol 2.5 continued.   5. Afib: patient with pacemaker in place. Pulse currently 94. Rate controlled on dilt 120.  Could consider going up on this if we think tachycardia is completely attributable to afib, as opposed to a sign of volume depletion.  6: CKD stage IV: GFR of 13 on admission. Creatinine currently 2.38 so back around baseline. Pt's baseline around 2.3. Pt CO2 and K wnl. Consulted with nephrology who recs DDAVP, anemia panel, and PTH. Anemia panel reveals mixed picture, likely components of iron deficiency and anemia of chronic disease.  Given nulecit 125 mg for 2 doses while in hospital. Will check a retic count today to make sure patient is responding adequately to blood loss., which was 2.9%. Patient will likely need iron replacement as outpatient.  7. Chest pain: resolved. Ischemia likely resulting  from anemia. Cards consulted and recommend correcting  anemia, state with current bleeding patient is not a candidate for anticoagulation therapy.  Will maintain Hb greater than 8.   8. FEN/GI: - kvo - heart healthy diet   9. Prophylaxis: SCDs, patient was actively bleeding no anticoagulation; Loratadine for itch.  Sleep aide: trazodone QHS PRN  10. Dispo: pending improvement in bleeding and stable hemodynamically  Marikay Alar, MD PGY1, FPTS

## 2011-11-05 NOTE — Evaluation (Signed)
Occupational Therapy Evaluation Patient Details Name: [name] MRN: [id number] DOB: Sep 09, 1930 Today's Date: 11/05/2011 Time: 5621-3086 OT Time Calculation (min): 20 min  OT Assessment / Plan / Recommendation Clinical Impression  Pt admitted for GI bleed with deficits listed below.  pt would benefit from cont. OT to increase I and safety with basic adls so she can return home with her husband.    OT Assessment  Patient needs continued OT Services    Follow Up Recommendations  Skilled nursing facility    Barriers to Discharge Decreased caregiver support husband unable to physically assist pt to the level that she requires.  Equipment Recommendations  None recommended by OT    Recommendations for Other Services    Frequency  Min 2X/week    Precautions / Restrictions Precautions Precautions: Fall Restrictions Weight Bearing Restrictions: No   Pertinent Vitals/Pain Pt with no c/o pain.    ADL  Eating/Feeding: Simulated;Set up Where Assessed - Eating/Feeding: Chair Grooming: Performed;Brushing hair;Wash/dry face;Set up Where Assessed - Grooming: Unsupported sitting Upper Body Bathing: Simulated;Set up Where Assessed - Upper Body Bathing: Unsupported sitting Lower Body Bathing: Simulated;Minimal assistance Where Assessed - Lower Body Bathing: Supported sit to stand Upper Body Dressing: Simulated;Set up Where Assessed - Upper Body Dressing: Unsupported sitting Lower Body Dressing: Performed;Minimal assistance Where Assessed - Lower Body Dressing: Supported sit to stand Toilet Transfer: Performed;Minimal assistance Toilet Transfer Method: Surveyor, minerals: Bedside commode Toileting - Architect and Hygiene: Min guard;Performed Where Assessed - Engineer, mining and Hygiene: Standing Equipment Used: Rolling walker Transfers/Ambulation Related to ADLs: Pt min guard to min assist for all adl transfers.  Cues for safety and to  watch for lines. ADL Comments: Pt with min assist only for LE adls.  pt able to reac feet w/o adaptive equipment.  Pt fatigues quickly and becomes very SOB.  Cues needed to take rest breaks.    OT Diagnosis: Generalized weakness  OT Problem List: Decreased strength;Decreased activity tolerance OT Treatment Interventions: Self-care/ADL training;Therapeutic activities   OT Goals Acute Rehab OT Goals OT Goal Formulation: With patient Time For Goal Achievement: 11/19/11 Potential to Achieve Goals: Good ADL Goals Pt Will Perform Grooming: with supervision;Standing at sink;Unsupported ADL Goal: Grooming - Progress: Goal set today Pt Will Perform Lower Body Bathing: with supervision;Sit to stand from chair ADL Goal: Lower Body Bathing - Progress: Goal set today Pt Will Perform Lower Body Dressing: with supervision;Sit to stand from chair;Unsupported ADL Goal: Lower Body Dressing - Progress: Goal set today Pt Will Perform Tub/Shower Transfer: Tub transfer;Shower seat without back;with min assist ADL Goal: Web designer - Progress: Goal set today Additional ADL Goal #1: Pt will complete all aspects of toileting with 3:1 over commode with S. ADL Goal: Additional Goal #1 - Progress: Goal set today  Visit Information  Last OT Received On: 11/05/11 Assistance Needed: +1    Subjective Data  Subjective: "I can do this!" Patient Stated Goal: to go home.   Prior Functioning  Vision/Perception  Home Living Lives With: Spouse Available Help at Discharge: Family;Available PRN/intermittently Type of Home: Mobile home Home Access: Stairs to enter Entrance Stairs-Number of Steps: 3-4 Entrance Stairs-Rails: Right Home Layout: One level Bathroom Shower/Tub: Tub only Firefighter: Standard Bathroom Accessibility: No Home Adaptive Equipment: Walker - rolling;Walker - four wheeled;Straight cane Prior Function Level of Independence: Independent with assistive device(s) Able to Take  Stairs?: Yes Driving: No Vocation: Retired Musician: No difficulties;HOH Dominant Hand: Right   Vision - Assessment  Vision Assessment: Vision not tested  Cognition  Overall Cognitive Status: Appears within functional limits for tasks assessed/performed Arousal/Alertness: Awake/alert Orientation Level: Appears intact for tasks assessed Behavior During Session: [facility] for tasks performed Cognition - Other Comments: Appears grossly intact.    Extremity/Trunk Assessment Right Upper Extremity Assessment RUE ROM/Strength/Tone: WFL for tasks assessed RUE Sensation: WFL - Light Touch RUE Coordination: WFL - gross/fine motor Left Upper Extremity Assessment LUE ROM/Strength/Tone: WFL for tasks assessed LUE Sensation: WFL - Light Touch LUE Coordination: WFL - gross/fine motor Trunk Assessment Trunk Assessment: Kyphotic   Mobility  Shoulder Instructions  Bed Mobility Bed Mobility: Supine to Sit;Sit to Supine Supine to Sit: 5: Supervision;HOB flat Sitting - Scoot to Edge of Bed: 5: Supervision Sit to Supine: 5: Supervision;HOB flat Details for Bed Mobility Assistance: Verbal cues for safest sequence. Transfers Transfers: Sit to Stand;Stand to Sit Sit to Stand: 4: Min guard;With upper extremity assist;From bed Stand to Sit: 4: Min guard;With upper extremity assist;To chair/3-in-1 Details for Transfer Assistance: Assist for balance and cues for hand placement.       Exercise     Balance Balance Balance Assessed: No Static Standing Balance Static Standing - Balance Support: Left upper extremity supported;During functional activity Static Standing - Level of Assistance: 5: Stand by assistance   End of Session OT - End of Session Activity Tolerance: Patient limited by fatigue;Other (comment) (pt limited by SOB) Patient left: in chair;with nursing in room;Other (comment) (transferring to 2007) Nurse Communication: Mobility status  GO     Hope Budds 11/05/2011, 12:22 PM [phone]

## 2011-11-05 NOTE — Progress Notes (Signed)
Physical Therapy Treatment Patient Details Name: [name] MRN: [id number] DOB: 02/04/1931 Today's Date: 11/05/2011 Time: 8469-6295 PT Time Calculation (min): 31 min  PT Assessment / Plan / Recommendation Comments on Treatment Session  Pt admitted with abnormal stools and continues to progress with therapy.  Pt motivated and able to increase ambulation distance today.    Follow Up Recommendations  Skilled nursing facility    Barriers to Discharge        Equipment Recommendations  None recommended by PT    Recommendations for Other Services    Frequency Min 3X/week   Plan Discharge plan remains appropriate;Frequency remains appropriate    Precautions / Restrictions Precautions Precautions: Fall Restrictions Weight Bearing Restrictions: No   Pertinent Vitals/Pain None    Mobility  Bed Mobility Bed Mobility: Supine to Sit;Sit to Supine Supine to Sit: 5: Supervision;HOB flat Sit to Supine: 5: Supervision;HOB flat Details for Bed Mobility Assistance: Verbal cues for safest sequence. Transfers Transfers: Sit to Stand;Stand to Sit (2 trials.) Sit to Stand: 4: Min assist;With upper extremity assist;From bed;From chair/3-in-1 Stand to Sit: 4: Min assist;With upper extremity assist;To chair/3-in-1;To bed Details for Transfer Assistance: Assist for balance and cues for hand placement. Ambulation/Gait Ambulation/Gait Assistance: 4: Min assist Ambulation Distance (Feet): 75 Feet Assistive device: Rolling walker Ambulation/Gait Assistance Details: Assist for balance with cues for tall posture and safety with RW. Gait Pattern: Step-through pattern;Decreased stride length;Trunk flexed Gait velocity: Decreased Stairs: No Wheelchair Mobility Wheelchair Mobility: No    Exercises     PT Diagnosis:    PT Problem List:   PT Treatment Interventions:     PT Goals Acute Rehab PT Goals PT Goal Formulation: With patient Time For Goal Achievement: 11/17/11 Potential to Achieve  Goals: Good PT Goal: Sit to Stand - Progress: Progressing toward goal PT Goal: Stand to Sit - Progress: Progressing toward goal PT Goal: Ambulate - Progress: Progressing toward goal  Visit Information  Last PT Received On: 11/05/11 Assistance Needed: +1    Subjective Data  Subjective: "I would love to walk until I can't anymore." Patient Stated Goal: Walk on her own again.   Cognition  Overall Cognitive Status: Appears within functional limits for tasks assessed/performed Arousal/Alertness: Awake/alert Orientation Level: Appears intact for tasks assessed Behavior During Session: [facility] for tasks performed    Balance  Balance Balance Assessed: No  End of Session PT - End of Session Equipment Utilized During Treatment: Gait belt Activity Tolerance: Patient tolerated treatment well Patient left: in bed;with call bell/phone within reach Nurse Communication: Mobility status   GP     Cephus Shelling 11/05/2011, 10:58 AM  11/05/2011 Cephus Shelling, PT, DPT [phone]
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
## 2011-11-05 NOTE — Progress Notes (Signed)
Patient ID: [name], female   DOB: [birth year], 78 y.o.   MRN: [id number]    SUBJECTIVE: Stable HR/BP.  She reports some ongoing red blood per rectum.      Marland Kitchen alteplase  2 mg Intracatheter Once  . alteplase  2 mg Intracatheter Once  . carvedilol  12.5 mg Oral BID WC  . diltiazem  120 mg Oral Daily  . ferric gluconate (FERRLECIT/NULECIT) IV  125 mg Intravenous Once  . loratadine  10 mg Oral Daily  . metoprolol  2.5 mg Intravenous Q6H  . pantoprazole  40 mg Oral Q0600  . simvastatin  20 mg Oral q1800  . sodium chloride  10-40 mL Intracatheter Q12H      Filed Vitals:   11/04/11 1935 11/04/11 2350 11/05/11 0449 11/05/11 0739  BP: 133/83 136/70 135/68   Pulse: 83 84 90   Temp: 98.4 F (36.9 C) 98.7 F (37.1 C) 97.8 F (36.6 C) 98.4 F (36.9 C)  TempSrc: Oral Oral Oral Oral  Resp:  19 23   Height:      Weight:   174 lb 9.7 oz (79.2 kg)   SpO2: 93% 94% 92%     Intake/Output Summary (Last 24 hours) at 11/05/11 0757 Last data filed at 11/05/11 0739  Gross per 24 hour  Intake   1660 ml  Output    350 ml  Net   1310 ml    LABS: Basic Metabolic Panel:  Basename 11/04/11 0340 11/03/11 0347  NA 143 142  K 3.6 3.5  CL 107 106  CO2 25 24  GLUCOSE 139* 158*  BUN 78* 95*  CREATININE 2.38* 2.68*  CALCIUM 9.5 8.7  MG -- --  PHOS 4.2 4.8*   Liver Function Tests:  Basename 11/04/11 0340 11/03/11 0347  AST -- --  ALT -- --  ALKPHOS -- --  BILITOT -- --  PROT -- --  ALBUMIN 2.6* 2.5*   No results found for this basename: LIPASE:2,AMYLASE:2 in the last 72 hours CBC:  Basename 11/05/11 0630 11/04/11 1735  WBC 8.6 6.6  NEUTROABS -- --  HGB 8.2* 7.5*  HCT 24.6* 22.6*  MCV 91.8 91.1  PLT 123* 111*   Cardiac Enzymes:  Basename 11/04/11 0925 11/04/11 0340 11/03/11 2100  CKTOTAL -- -- --  CKMB -- -- --  CKMBINDEX -- -- --  TROPONINI <0.30 <0.30 <0.30   BNP: No components found with this basename: POCBNP:3 D-Dimer: No results found for this basename:  DDIMER:2 in the last 72 hours Hemoglobin A1C: No results found for this basename: HGBA1C in the last 72 hours Fasting Lipid Panel: No results found for this basename: CHOL,HDL,LDLCALC,TRIG,CHOLHDL,LDLDIRECT in the last 72 hours Thyroid Function Tests: No results found for this basename: TSH,T4TOTAL,FREET3,T3FREE,THYROIDAB in the last 72 hours Anemia Panel:  Basename 11/03/11 0347  VITAMINB12 --  FOLATE --  FERRITIN 214  TIBC 210*  IRON 23*  RETICCTPCT --   PHYSICAL EXAM General: NAD Neck: JVP 8 cm, no thyromegaly or thyroid nodule.  Lungs: Slight crackles at bases.  CV: Nondisplaced PMI.  Heart irregular S1/S2, no S3/S4, 1/6 SEM.  No peripheral edema.  No carotid bruit.   Abdomen: Soft, nontender, no hepatosplenomegaly, no distention.  Neurologic: Alert and oriented x 3.  Psych: Normal affect. Extremities: No clubbing or cyanosis.   TELEMETRY: Reviewed telemetry pt in Atrial fibrillation, HR 70s  ASSESSMENT AND PLAN: 78 yo with history of chronic diastolic CHF/HCM and chronic atrial fibrillation presented with upper GI bleed in setting  of NSAID use.  1. CHF: Chronic diastolic CHF in setting of HCM.  Her torsemide has been held in setting of acute upper GI bleed with AKI.  Will need to resume po torsemide soon, will see what BMET looks like today (ordered).   2. Atrial fibrillation: Chronic with history of CVA.   She has been on coumadin but has now had an upper GI bleed in the setting of heavy NSAID use.  At this time, I would favor holding coumadin for 2 weeks then restarting with INR goal 2-2.5.  She should not take NSAIDs at all.  Continue Coreg and diltiazem CD. 3. Upper GI bleed: Gastric ulcer, likely cause = heavy NSAID use.  Stop NSAIDs, PPI, hold coumadin x 2 wks.  4. AKI: Creatinine has returned to near her baseline (2.3).  Hold off on ACEI for now and no more NSAIDs.   [name] 11/05/2011 8:03 AM

## 2011-11-06 ENCOUNTER — Encounter (HOSPITAL_COMMUNITY): Payer: Self-pay | Admitting: Internal Medicine

## 2011-11-06 DIAGNOSIS — I5032 Chronic diastolic (congestive) heart failure: Secondary | ICD-10-CM

## 2011-11-06 DIAGNOSIS — I509 Heart failure, unspecified: Secondary | ICD-10-CM

## 2011-11-06 DIAGNOSIS — N184 Chronic kidney disease, stage 4 (severe): Secondary | ICD-10-CM

## 2011-11-06 LAB — BASIC METABOLIC PANEL
Calcium: 9.3 mg/dL (ref 8.4–10.5)
Creatinine, Ser: 2.13 mg/dL — ABNORMAL HIGH (ref 0.50–1.10)
GFR calc Af Amer: 24 mL/min — ABNORMAL LOW (ref >90)

## 2011-11-06 LAB — CBC
MCV: 92.5 fL (ref 78.0–100.0)
Platelets: 123 10*3/uL — ABNORMAL LOW (ref 150–400)
RDW: 16.4 % — ABNORMAL HIGH (ref 11.5–15.5)
WBC: 8.1 10*3/uL (ref 4.0–10.5)

## 2011-11-06 LAB — GLUCOSE, CAPILLARY
Glucose-Capillary: 149 mg/dL — ABNORMAL HIGH (ref 70–99)
Glucose-Capillary: 177 mg/dL — ABNORMAL HIGH (ref 70–99)

## 2011-11-06 MED ORDER — FERROUS SULFATE 325 (65 FE) MG PO TABS
325.0000 mg | ORAL_TABLET | Freq: Every day | ORAL | Status: DC
  Administered 2011-11-07: 325 mg via ORAL
  Filled 2011-11-06 (×2): qty 1

## 2011-11-06 MED ORDER — POLYETHYLENE GLYCOL 3350 17 G PO PACK
17.0000 g | PACK | Freq: Every day | ORAL | Status: DC | PRN
  Filled 2011-11-06: qty 1

## 2011-11-06 NOTE — Progress Notes (Signed)
Inpatient Diabetes Program Recommendations  AACE/ADA: New Consensus Statement on Inpatient Glycemic Control (2013)  Target Ranges:  Prepandial:   less than 140 mg/dL      Peak postprandial:   less than 180 mg/dL (1-2 hours)      Critically ill patients:  140 - 180 mg/dL   Reason for Visit: Note history of diabetes.  Please add Glycemic control order set.  Consider sensitive Novolog correction tid with meals and HS scale.  Will follow.
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
## 2011-11-06 NOTE — Progress Notes (Signed)
Patient ID: ANAHITA CUA, female   DOB: 20-Apr-1930, 78 y.o.   MRN: [id number]          Family Medicine Teaching Service [facility] Progress Note     Patient name: [name] Medical record number: [id number] Date of birth: [birth year] Age: 78 y.o. Gender: female    LOS: 5 days   Primary Care Provider: Sanjuana Letters, MD  Overnight Events: Patient states doing ok.  Has not had a BM since Saturday, wants something to help with this.  Patient is awaiting placement at SNF.  Objective: Vital signs in last 24 hours: BP 120/68  Pulse 88  Temp 98.7 F (37.1 C) (Oral)  Resp 20  Ht 5\' 5"  (1.651 m)  Wt 173 lb 11.2 oz (78.79 kg)  BMI 28.91 kg/m2  SpO2 92%    Physical Exam: Gen: NAD. Extremely pleasant women. HEENT: conjunctiva pale CV: irregular. No murmurs, gallops, or rubs  Res: Clear breath sounds. No rales. No wheezes. Abd: Soft. Non tender. Non distended. Ext/Musc: No edema  Labs/Studies:  Results for orders placed during the hospital encounter of 11/01/11 (from the past 24 hour(s))  BASIC METABOLIC PANEL     Status: Abnormal   Collection Time   11/05/11 10:50 AM      Component Value Range   Sodium 142  135 - 145 mEq/L   Potassium 3.2 (*) 3.5 - 5.1 mEq/L   Chloride 106  96 - 112 mEq/L   CO2 24  19 - 32 mEq/L   Glucose, Bld 277 (*) 70 - 99 mg/dL   BUN 59 (*) 6 - 23 mg/dL   Creatinine, Ser 8.65 (*) 0.50 - 1.10 mg/dL   Calcium 9.0  8.4 - 78.4 mg/dL   GFR calc non Af Amer 22 (*) >90 mL/min   GFR calc Af Amer 26 (*) >90 mL/min  GLUCOSE, CAPILLARY     Status: Abnormal   Collection Time   11/05/11 12:02 PM      Component Value Range   Glucose-Capillary 231 (*) 70 - 99 mg/dL   Comment 1 Notify RN    GLUCOSE, CAPILLARY     Status: Abnormal   Collection Time   11/05/11  4:29 PM      Component Value Range   Glucose-Capillary 136 (*) 70 - 99 mg/dL   Comment 1 Documented in Chart     Comment 2 Notify RN    URINALYSIS, ROUTINE W REFLEX MICROSCOPIC     Status: Abnormal   Collection Time   11/05/11  4:58 PM      Component Value Range   Color, Urine YELLOW  YELLOW   APPearance CLOUDY (*) CLEAR   Specific Gravity, Urine 1.008  1.005 - 1.030   pH 5.5  5.0 - 8.0   Glucose, UA NEGATIVE  NEGATIVE mg/dL   Hgb urine dipstick LARGE (*) NEGATIVE   Bilirubin Urine NEGATIVE  NEGATIVE   Ketones, ur NEGATIVE  NEGATIVE mg/dL   Protein, ur NEGATIVE  NEGATIVE mg/dL   Urobilinogen, UA 0.2  0.0 - 1.0 mg/dL   Nitrite NEGATIVE  NEGATIVE   Leukocytes, UA LARGE (*) NEGATIVE  URINE MICROSCOPIC-ADD ON     Status: Abnormal   Collection Time   11/05/11  4:58 PM      Component Value Range   Squamous Epithelial / LPF RARE  RARE   WBC, UA 21-50  <3 WBC/hpf   RBC / HPF 0-2  <3 RBC/hpf   Bacteria, UA MANY (*) RARE  BASIC  METABOLIC PANEL     Status: Abnormal   Collection Time   11/06/11  5:00 AM      Component Value Range   Sodium 141  135 - 145 mEq/L   Potassium 3.4 (*) 3.5 - 5.1 mEq/L   Chloride 104  96 - 112 mEq/L   CO2 23  19 - 32 mEq/L   Glucose, Bld 254 (*) 70 - 99 mg/dL   BUN 56 (*) 6 - 23 mg/dL   Creatinine, Ser 0.86 (*) 0.50 - 1.10 mg/dL   Calcium 9.3  8.4 - 57.8 mg/dL   GFR calc non Af Amer 21 (*) >90 mL/min   GFR calc Af Amer 24 (*) >90 mL/min  CBC     Status: Abnormal   Collection Time   11/06/11  5:00 AM      Component Value Range   WBC 8.1  4.0 - 10.5 K/uL   RBC 2.54 (*) 3.87 - 5.11 MIL/uL   Hemoglobin 7.7 (*) 12.0 - 15.0 g/dL   HCT 46.9 (*) 62.9 - 52.8 %   MCV 92.5  78.0 - 100.0 fL   MCH 30.3  26.0 - 34.0 pg   MCHC 32.8  30.0 - 36.0 g/dL   RDW 41.3 (*) 24.4 - 01.0 %   Platelets 123 (*) 150 - 400 K/uL  GLUCOSE, CAPILLARY     Status: Abnormal   Collection Time   11/06/11  6:12 AM      Component Value Range   Glucose-Capillary 241 (*) 70 - 99 mg/dL     Medications: Scheduled Meds:    . alteplase  2 mg Intracatheter Once  . atorvastatin  10 mg Oral q1800  . carvedilol  12.5 mg Oral BID WC  . diltiazem  120 mg Oral Daily  . ferrous sulfate  325  mg Oral Q breakfast  . loratadine  10 mg Oral Daily  . pantoprazole  40 mg Oral Q0600  . potassium chloride  40 mEq Oral BID  . sodium chloride  10-40 mL Intracatheter Q12H  . torsemide  100 mg Oral Daily  . DISCONTD: simvastatin  20 mg Oral q1800   Continuous Infusions:    . sodium chloride 20 mL/hr at 11/04/11 1800   PRN Meds:.sodium chloride, acetaminophen, acetaminophen, polyethylene glycol, sodium chloride, traZODone  Echo: 55-60% EF   Assessment/Plan: [name] is a 78 y.o. year old female with a history of DMII, HLD, HTN, MI, afib on coumadin, and CKD IV who presents with a 3-4 week history of rectal bleeding and recent increase in bruising.   1. Rectal bleeding: patient with history of diverticulosis. EGD revealed non-bleeding gastric ulcer, possible cause of GI bleed with INR supratherapeutic at 9.51 at admission. Hb of 7.6 at time of admission that dropped to 6.8.  - HgB: 7.7, down from 8.2 - INR: last check was 1.41-will allow to stay subtherapeutic as risk of continued GI bleed outweighs risk of stroke in this setting - Central line placed  - GI consulted recs continue PPI, avoid NSAIDs. GI has signed off. - H. Pylori negative - Last bloody stool: Saturday evening - EGD: stomach with antral erosions and small clean based ulcer without bleeding.  - Follow Potassium, consider replacement if no wnl-currently 3.4 - will recheck CBC in am-if stable consider sending home tomorrow - miralax for constipation  2. Supratherapeutic INR: With PO vit K 2.5 mg and IV vit K 10 mg. Last INR 1.41. Will continue to hold coumadin as patient  continues to have drop in Hb.  Consider restarting anticoagulation in 2 weeks from the bleeding event.  Given patients history of poor follow-up recently would recommend against coumadin and consider other options.  3. DM: glucose on admission 121.  Stable in the 130's-240's. Will consider SSI if glucose over 200. Held home glipizide.   4. HTN:  BP currently 120/68.  BP currently stable.  Will continue to watch hemodynamic status. Coreg 12.5 and dilt 120 started. Metoprolol 2.5 continued.   5. Afib: patient with pacemaker in place. Pulse currently 88. Rate controlled on dilt 120.  Marland Kitchen  6: CKD stage IV: GFR of 13 on admission. Creatinine currently 2.13 so back around baseline. Pt's baseline around 2.3. Pt CO2 and K wnl.  Anemia panel reveals mixed picture, likely components of iron deficiency and anemia of chronic disease.  Given nulecit 125 mg for 2 doses while in hospital. Also, received DDAVP x1. Retic count 2.9%. Patient will likely need iron replacement as outpatient.  7. Chest pain: resolved. Ischemia likely resulting from anemia. Cards consulted and recommend correcting anemia, state with current bleeding patient is not a candidate for anticoagulation therapy.  Will maintain Hb greater than 8.   8. Diastolic HF: torsemide restarted yesterday. Patient with minimal output on 100 mg yesterday.    9. FEN/GI: - kvo - heart healthy diet   10. Prophylaxis: SCDs, patient was actively bleeding no anticoagulation; Loratadine for itch.  Sleep aide: trazodone QHS PRN  11. Dispo: pending improvement in bleeding and stable hemodynamically, PT/OT rec SNF  Marikay Alar, MD PGY1, FPTS

## 2011-11-06 NOTE — Progress Notes (Addendum)
Patient ID: [name], female   DOB: 1930/04/24, 78 y.o.   MRN: [id number]    SUBJECTIVE: Stable HR/BP.  Feels weak.  Short of breath when she walked with PT yesterday.      Marland Kitchen alteplase  2 mg Intracatheter Once  . atorvastatin  10 mg Oral q1800  . carvedilol  12.5 mg Oral BID WC  . diltiazem  120 mg Oral Daily  . ferrous sulfate  325 mg Oral Q breakfast  . loratadine  10 mg Oral Daily  . pantoprazole  40 mg Oral Q0600  . potassium chloride  40 mEq Oral BID  . sodium chloride  10-40 mL Intracatheter Q12H  . torsemide  100 mg Oral Daily  . DISCONTD: metoprolol  2.5 mg Intravenous Q6H  . DISCONTD: simvastatin  20 mg Oral q1800      Filed Vitals:   11/05/11 1227 11/05/11 2127 11/05/11 2134 11/06/11 0438  BP: 141/76 118/62  124/71  Pulse: 83 88    Temp: 98.3 F (36.8 C) 99.3 F (37.4 C)  98.7 F (37.1 C)  TempSrc: Oral Oral Oral Oral  Resp: 21 20  20   Height:      Weight:    173 lb 11.2 oz (78.79 kg)  SpO2: 92% 95%  92%    Intake/Output Summary (Last 24 hours) at 11/06/11 0741 Last data filed at 11/05/11 1730  Gross per 24 hour  Intake    670 ml  Output    100 ml  Net    570 ml    LABS: Basic Metabolic Panel:  Basename 11/06/11 0500 11/05/11 1050 11/04/11 0340  NA 141 142 --  K 3.4* 3.2* --  CL 104 106 --  CO2 23 24 --  GLUCOSE 254* 277* --  BUN 56* 59* --  CREATININE 2.13* 2.01* --  CALCIUM 9.3 9.0 --  MG -- -- --  PHOS -- -- 4.2   Liver Function Tests:  Basename 11/04/11 0340  AST --  ALT --  ALKPHOS --  BILITOT --  PROT --  ALBUMIN 2.6*   No results found for this basename: LIPASE:2,AMYLASE:2 in the last 72 hours CBC:  Basename 11/06/11 0500 11/05/11 0630  WBC 8.1 8.6  NEUTROABS -- --  HGB 7.7* 8.2*  HCT 23.5* 24.6*  MCV 92.5 91.8  PLT 123* 123*   Cardiac Enzymes:  Basename 11/04/11 0925 11/04/11 0340 11/03/11 2100  CKTOTAL -- -- --  CKMB -- -- --  CKMBINDEX -- -- --  TROPONINI <0.30 <0.30 <0.30   BNP: No components found with  this basename: POCBNP:3 D-Dimer: No results found for this basename: DDIMER:2 in the last 72 hours Hemoglobin A1C: No results found for this basename: HGBA1C in the last 72 hours Fasting Lipid Panel: No results found for this basename: CHOL,HDL,LDLCALC,TRIG,CHOLHDL,LDLDIRECT in the last 72 hours Thyroid Function Tests: No results found for this basename: TSH,T4TOTAL,FREET3,T3FREE,THYROIDAB in the last 72 hours Anemia Panel:  Basename 11/05/11 0630  VITAMINB12 --  FOLATE --  FERRITIN --  TIBC --  IRON --  RETICCTPCT 2.9   PHYSICAL EXAM General: NAD Neck: JVP 12 cm, no thyromegaly or thyroid nodule.  Lungs: Slight crackles at bases.  CV: Nondisplaced PMI.  Heart irregular S1/S2, no S3/S4, 1/6 SEM.  No peripheral edema.  No carotid bruit.   Abdomen: Soft, nontender, no hepatosplenomegaly, no distention.  Neurologic: Alert and oriented x 3.  Psych: Normal affect. Extremities: No clubbing or cyanosis.   TELEMETRY: Reviewed telemetry pt in atrial fibrillation  ASSESSMENT AND PLAN: 78 yo with history of chronic diastolic CHF/HCM and chronic atrial fibrillation presented with upper GI bleed in setting of NSAID use.  1. CHF: Chronic diastolic CHF in setting of HCM.  Her torsemide was held in setting of acute upper GI bleed with AKI and resumed yesterday.  Follow I/Os.  2. Atrial fibrillation: Chronic with history of CVA.   She has been on coumadin but has now had an upper GI bleed in the setting of heavy NSAID use.  At this time, I would favor holding coumadin for 2 weeks then restarting with INR goal 2-2.5.  She should not take NSAIDs at all.  Continue Coreg and diltiazem CD. 3. Upper GI bleed: Gastric ulcer, likely cause = heavy NSAID use.  Stop NSAIDs, PPI, hold coumadin x 2 wks.  Hemoglobin trending down.  No further overt bleeding.   Transfuse hgb < 7.  4. AKI: Creatinine has returned to baseline.  Hold off on ACEI for now and no more NSAIDs.   [name] 11/06/2011 7:41  AM

## 2011-11-06 NOTE — Progress Notes (Signed)
I discussed with Dr Birdie Sons.  I agree with their plans documented in their progress note for today. Dr [name] saw the patient today.  Please see his progress note for today.

## 2011-11-06 NOTE — Care Management Note (Signed)
    Page 1 of 1   11/06/2011     3:29:22 PM   CARE MANAGEMENT NOTE 11/06/2011  Patient:  OVAL, BIRT   Account Number:  [id number]  Date Initiated:  11/02/2011  Documentation initiated by:  Donn Pierini  Subjective/Objective Assessment:   Pt admitted with GIB     Action/Plan:   PTA pt lived at home concerns over home environment- CSW consulted- PT eval pending   Anticipated DC Date:  11/07/2011   Anticipated DC Plan:  SKILLED NURSING FACILITY      DC Planning Services  CM consult      Choice offered to / List presented to:             Status of service:  In process, will continue to follow Medicare Important Message given?   (If response is "NO", the following Medicare IM given date fields will be blank) Date Medicare IM given:   Date Additional Medicare IM given:    Discharge Disposition:    Per UR Regulation:  Reviewed for med. necessity/level of care/duration of stay  If discussed at Long Length of Stay Meetings, dates discussed:    Comments:  11-06-11 3:30pm Avie Arenas, RNBSN [phone] Per SW - patient has co pay of 50.00 per day for first 20 days at [facility]  May not be agreeable to that.  If goes home will need PT/OT, RN, aide and SW ordered to follow her.  11-05-11 3:15pm Avie Arenas, RNBSN [phone] Tx to acute unit from intermediate today  - post GIB - plan for SNF.  SW consult placed.

## 2011-11-07 DIAGNOSIS — I4891 Unspecified atrial fibrillation: Secondary | ICD-10-CM

## 2011-11-07 LAB — BASIC METABOLIC PANEL
Calcium: 9.1 mg/dL (ref 8.4–10.5)
GFR calc non Af Amer: 20 mL/min — ABNORMAL LOW (ref >90)
Glucose, Bld: 268 mg/dL — ABNORMAL HIGH (ref 70–99)
Sodium: 139 mEq/L (ref 135–145)

## 2011-11-07 LAB — CBC
Hemoglobin: 8.6 g/dL — ABNORMAL LOW (ref 12.0–15.0)
MCH: 30.3 pg (ref 26.0–34.0)
MCHC: 33.1 g/dL (ref 30.0–36.0)
Platelets: 172 10*3/uL (ref 150–400)
RDW: 16.3 % — ABNORMAL HIGH (ref 11.5–15.5)

## 2011-11-07 LAB — GLUCOSE, CAPILLARY

## 2011-11-07 MED ORDER — DILTIAZEM HCL ER COATED BEADS 120 MG PO CP24: 120.0000 mg | ORAL_CAPSULE | Freq: Every day | ORAL | Status: DC

## 2011-11-07 MED ORDER — PANTOPRAZOLE SODIUM 40 MG PO TBEC: 40.0000 mg | DELAYED_RELEASE_TABLET | Freq: Every day | ORAL | Status: DC

## 2011-11-07 NOTE — Discharge Instructions (Signed)
Gastrointestinal Bleeding Gastrointestinal (GI) bleeding is bleeding from the gut or any place between your mouth and anus. If bleeding is slow, you may be allowed to go home. If there is a lot of bleeding, hospitalization and observation are often required. SYMPTOMS   You vomit bright red blood or material that looks like coffee grounds.   You have blood in your stools or the stools look black and tarry.  DIAGNOSIS  Your caregiver may diagnose your condition by taking a history and a physical exam. More tests may be needed, including:  X-rays.   EGD (esophagogastroduodenoscopy), which looks at your esophagus, stomach, and small bowel through a flexible telescope-like instrument.   Colonoscopy, which looks at your colon/large bowel through a flexible telescope-like instrument.   Biopsies, which remove a small sample of tissue to examine under a microscope.  Finding out the results of your test Not all test results are available during your visit. If your test results are not back during the visit, make an appointment with your caregiver to find out the results. Do not assume everything is normal if you have not heard from your caregiver or the medical facility. It is important for you to follow up on all of your test results. HOME CARE INSTRUCTIONS   Follow instructions as suggested by your caregiver regarding medicines. Do not take aspirin, drink alcohol, or take medicines for pain and arthritis unless your caregiver says it is okay.   Get the suggested follow-up care when the tests are done.  SEEK IMMEDIATE MEDICAL CARE IF:   Your bleeding increases or you become lightheaded, weak, or pass out (faint).   You experience severe cramps in your stomach, back, or belly (abdomen).   You pass large clots.   The problems which brought you in for medical care get worse.  MAKE SURE YOU:   Understand these instructions.   Will watch your condition.   Will get help right away if you are  not doing well or get worse.  Document Released: 01/27/2000 Document Revised: 01/18/2011 Document Reviewed: 01/08/2011 [employer] Patient Information 2012 [employer]. 

## 2011-11-07 NOTE — Evaluation (Signed)
Clinical/Bedside Swallow Evaluation Patient Details  Name: [name] MRN: [id number] Date of Birth: 1930-09-04  Today's Date: 11/07/2011 Time: 1037-1100 SLP Time Calculation (min): 23 min  Past Medical History:  Past Medical History  Diagnosis Date  . Cardiac pacemaker     Dr. Juanda Chance; [employer] VVI  . Macular hole of left eye     told will lose sight  . NICM (nonischemic cardiomyopathy)   . Atrial fibrillation     off anticoagulation presumed to anemia  . Coronary atherosclerosis     and old MI and hx of CVA   . Depression   . Dysphagia   . DM (diabetes mellitus)     nueropathy  . CRI (chronic renal insufficiency)     cr basleine 1.5-1.6  . HTN (hypertension)   . HLD (hyperlipidemia)   . Incontinence   . CHF (congestive heart failure)     related to dyastolic dysfunction  . CAD (coronary artery disease)     nonobstructive  . Osteoarthritis   . Scoliosis   . Repeated falls     hx  . Stasis dermatitis   . Hearing loss   . Anemia   . CHF (congestive heart failure)     cardiacc ath EF 25-30%  . Pacemaker   . Cancer   . Stroke    Past Surgical History:  Past Surgical History  Procedure Date  . Bliateral foot surgery 08/19/01  . Bladder tack 08/19/01  . Hysterectomy and bso 08/19/01  . Melanoma excision 08/19/01  . Pacemaker placement     x2  . Tonsillectomy   . Appendectomy   . Esophagogastroduodenoscopy 11/02/2011    Procedure: ESOPHAGOGASTRODUODENOSCOPY (EGD);  Surgeon: Hilarie Fredrickson, MD;  Location: [facility] END[facility]COPY;  Service: Endoscopy;  Laterality: N/A;   HPI:  [name] is a 78 y.o. year old female with a history of DMII, HLD, HTN, MI, afib on coumadin, and CKD IV who presents with a 3-4 week history of rectal bleeding and recent increase in bruising. Pt complains of difficutly swallow, states this has been going on for a while, some times coughs up liquids, food gets stuck.  Esohagram in 2005 states: Swallowing function is normal. There is mild bony spurring of  the sacral spine with mild extrinsic compression of the cervical esophagus. No obstruction to the passage of a 13mm tablet. There is mild diffuse spasm of the esophagus. There is marked deviation to the right and posteriorly of the distal esophagus by enlarged heart and probable enlarged left atrium.    Assessment / Plan / Recommendation Clinical Impression  Pt demosntrates complaints and signs consistent with a primary esophageal dysphagia. Pt with initially functional appearance of oral and oropharyngeal fuction. Then after aseveral bites, pt demonstrates some delayed cough, globus sensation and dry swallows. Pt likely with some sensation of esophageal stasis or reflux, consistent with findings of 2005 esophagram. Presentation appears mild. Pt acknoledges that she feesl some discomfort and histant to verblize desire for treatment. SLP offered esopahgeal precautions and strategies. Pt may benefit from f/u with GI or esophageal testing. Pt states Dr. [name] is aware of this issue so SLP will defer to PCP treatment of this issue. No SLP f/u recommended at this time.     Aspiration Risk  Mild    Diet Recommendation Regular;Thin liquid   Liquid Administration via: Cup;Straw Medication Administration: Whole meds with liquid Supervision: Patient able to self feed Compensations: Slow rate;Small sips/bites;Follow solids with liquid (Dont eat before  bed, Try hot liquids with small meals) Postural Changes and/or Swallow Maneuvers: Seated upright 90 degrees;Upright 30-60 min after meal    Other  Recommendations Recommended Consults: Consider GI evaluation;Consider esophageal assessment Oral Care Recommendations: Oral care BID   Follow Up Recommendations  None    Frequency and Duration        Pertinent Vitals/Pain NA    SLP Swallow Goals     Swallow Study Prior Functional Status       General HPI: [name] is a 78 y.o. year old female with a history of DMII, HLD, HTN, MI, afib on  coumadin, and CKD IV who presents with a 3-4 week history of rectal bleeding and recent increase in bruising. Pt complains of difficutly swallow, states this has been going on for a while, some times coughs up liquids, food gets stuck.  Esohagram in 2005 states: Swallowing function is normal. There is mild bony spurring of the sacral spine with mild extrinsic compression of the cervical esophagus. No obstruction to the passage of a 13mm tablet. There is mild diffuse spasm of the esophagus. There is marked deviation to the right and posteriorly of the distal esophagus by enlarged heart and probable enlarged left atrium.  Type of Study: Bedside swallow evaluation Diet Prior to this Study: Regular;Thin liquids Behavior/Cognition: Alert;Cooperative;Pleasant mood Oral Cavity - Dentition: Missing dentition Self-Feeding Abilities: Able to feed self Patient Positioning: Upright in bed Baseline Vocal Quality: Clear Volitional Cough: Strong Volitional Swallow: Able to elicit    Oral/Motor/Sensory Function Overall Oral Motor/Sensory Function: Appears within functional limits for tasks assessed   Ice Chips     Thin Liquid Thin Liquid: Impaired Presentation: Cup Pharyngeal  Phase Impairments: Cough - Delayed    Nectar Thick Nectar Thick Liquid: Not tested   Honey Thick Honey Thick Liquid: Not tested   Puree Puree: Not tested   Solid   GO    Solid: Impaired Presentation: Self Fed Pharyngeal Phase Impairments: Cough - Delayed       [name], Riley Nearing 11/07/2011,11:16 AM

## 2011-11-07 NOTE — Progress Notes (Signed)
I discussed with Dr Sonnenberg.  I agree with their plans documented in their progress note for today.  

## 2011-11-07 NOTE — Addendum Note (Signed)
Addended by: Tobey Grim on: 11/07/2011 09:56 AM   Modules accepted: Level of Service

## 2011-11-07 NOTE — Discharge Summary (Signed)
Physician Discharge Summary  Patient ID: [name] MRN: [id number] DOB: Apr 01, 1930 Age: 78 y.o.  Admit date: 11/01/2011 Discharge date: 11/07/2011 Admitting Physician: Tobey Grim, MD  PCP: Sanjuana Letters, MD  Consultants:GI, Cards, Nephrology     Discharge Diagnosis: GI bleed Active Problems:  Nonspecific abnormal finding in stool contents  Acute posthemorrhagic anemia  Coagulopathy  Blood in stool  Gastric ulcer    Hospital Course [name] is a 78 y.o. year old female with a history of DMII, HLD, HTN, MI, afib on coumadin, and CKD IV who presents with a 3-4 week history of rectal bleeding and recent increase in bruising.   1. Rectal bleeding: patient with history of diverticulosis.  Presented with several day course of dark tarry stools and found to have supratherapeutic INR of 9.51. EGD revealed non-bleeding gastric ulcer. Hb of 7.6 at time of admission that dropped to 6.8.  Access was difficult to obtain and a central line was placed day of admission. The patient was transfused 2 units of blood on admission with good response. Patients hemoglobin continued to drift down to a low of 7.1.  The patient had an episode of chest pain when Hb at 7.1 and was transfused 1 u RBCs with chest pain resolving. Patient received 2 doses of nulecit 125 mg and was started on PO iron while in the hospital.  Continued on PPI and anticoagulants held while in house.  Her last tarry stool was Saturday evening. Hb currently stable at 8.6.  2. Supratherapeutic INR: Patient with INR of 9.51 on admission. With PO vit K 2.5 mg and IV vit K 10 mg INR came down to subtherapeutic range with last INR 1.41. Coumadin was held at discharge given recent GI bleed and patient non-compliance with INR follow-up. Consider restarting anticoagulation in 2 weeks from the bleeding event. Given patients history of poor follow-up recently would recommend against coumadin and consider other options.   3. DM:  glucose on admission 121. Stable in the 130's-240's. No treatment while in hospital. Held home glipizide.   4. HTN: Initially BP meds were held in setting of GI bleed. Once GI bleed slowed down her homemedications were restarted with diltiazem at half her home dose. BP currently stable at time of discharge. Coreg 12.5 and dilt 120. Metoprolol 2.5.   5. Afib: patient with pacemaker in place. Remained in afib throughout hospitalization. Pulse currently 88. Rate controlled on dilt 120.  6: Acute on chronic kidney disease: GFR of 13 on admission Cr of 3.14 at admission.  Likely an acute on chronic kidney injury in setting of volume loss. Creatinine currently 2.13 so back around baseline. Anemia panel reveals mixed picture, likely components of iron deficiency and anemia of chronic disease. Given nulecit 125 mg for 2 doses while in hospital. Also, received DDAVP x1. Retic count 2.9%.  7. Chest pain: Patient with episode of chest pain with non-specific T wave changes. Troponins cycled and negative. Ischemia likely resulting from anemia. Cards consulted and recommend correcting anemia, state with current bleeding patient is not a candidate for anticoagulation therapy. Chest pain resolved shortly after receiving one unit of RBCs.  No more chest pain during hospitalization.  8. Diastolic HF: Patient on home torsemide.  This was initially held given acute GI bleed and AKI. Torsemide restarted 9/23.    Problem List 1. GI bleed 2. Supratherapeutic INR 3. DM 4. HTN 5. Afib 6. Acute on chronic kidney injury 7. Chest pain 8. Diastolic HF  Discharge PE   Filed Vitals:   11/07/11 0606  BP: 122/65  Pulse: 86  Temp: 97.9 F (36.6 C)  Resp: 18   Gen: NAD. Extremely pleasant women.  HEENT: conjunctiva pale  CV: irregular. No murmurs, gallops, or rubs  Res: Clear breath sounds. No rales. No wheezes.  Abd: Soft. Non tender. Non distended.  Ext/Musc: No edema   Procedures/Imaging:  Ir Fluoro  Guide Cv Line Right  11/02/2011  IMPRESSION: 1. Technically successful right IJ triple lumen power injectable central venous catheter placement.       EGD: stomach antral erosions and small clean based ulcer without bleeding   Labs  CBC  Lab 11/07/11 0630 11/06/11 0500 11/05/11 0630  WBC 8.7 8.1 8.6  HGB 8.6* 7.7* 8.2*  HCT 26.0* 23.5* 24.6*  PLT 172 123* 123*   BMET  Lab 11/06/11 0500 11/05/11 1050 11/04/11 0340 11/01/11 1151  NA 141 142 143 --  K 3.4* 3.2* 3.6 --  CL 104 106 107 --  CO2 23 24 25  --  BUN 56* 59* 78* --  CREATININE 2.13* 2.01* 2.38* --  CALCIUM 9.3 9.0 9.5 --  PROT -- -- -- 6.7  BILITOT -- -- -- 0.2*  ALKPHOS -- -- -- 53  ALT -- -- -- 13  AST -- -- -- 13  GLUCOSE 254* 277* 139* --   Results for orders placed during the hospital encounter of 11/01/11 (from the past 72 hour(s))  GLUCOSE, CAPILLARY     Status: Abnormal   Collection Time   11/04/11  4:41 PM      Component Value Range Comment   Glucose-Capillary 150 (*) 70 - 99 mg/dL    Comment 1 Documented in Chart      Comment 2 Notify RN     CBC     Status: Abnormal   Collection Time   11/04/11  5:35 PM      Component Value Range Comment   WBC 6.6  4.0 - 10.5 K/uL    RBC 2.48 (*) 3.87 - 5.11 MIL/uL    Hemoglobin 7.5 (*) 12.0 - 15.0 g/dL    HCT 40.9 (*) 81.1 - 46.0 %    MCV 91.1  78.0 - 100.0 fL    MCH 30.2  26.0 - 34.0 pg    MCHC 33.2  30.0 - 36.0 g/dL    RDW 91.4 (*) 78.2 - 15.5 %    Platelets 111 (*) 150 - 400 K/uL CONSISTENT WITH PREVIOUS RESULT  GLUCOSE, CAPILLARY     Status: Abnormal   Collection Time   11/04/11 10:06 PM      Component Value Range Comment   Glucose-Capillary 142 (*) 70 - 99 mg/dL    Comment 1 Notify RN      Comment 2 Documented in Chart     CBC     Status: Abnormal   Collection Time   11/05/11  6:30 AM      Component Value Range Comment   WBC 8.6  4.0 - 10.5 K/uL    RBC 2.68 (*) 3.87 - 5.11 MIL/uL    Hemoglobin 8.2 (*) 12.0 - 15.0 g/dL    HCT 95.6 (*) 21.3 - 46.0 %      MCV 91.8  78.0 - 100.0 fL    MCH 30.6  26.0 - 34.0 pg    MCHC 33.3  30.0 - 36.0 g/dL    RDW 08.6 (*) 57.8 - 15.5 %    Platelets 123 (*) 150 - 400  K/uL   RETICULOCYTES     Status: Abnormal   Collection Time   11/05/11  6:30 AM      Component Value Range Comment   Retic Ct Pct 2.9  0.4 - 3.1 %    RBC. 2.69 (*) 3.87 - 5.11 MIL/uL    Retic Count, Manual 78.0  19.0 - 186.0 K/uL   GLUCOSE, CAPILLARY     Status: Abnormal   Collection Time   11/05/11  8:23 AM      Component Value Range Comment   Glucose-Capillary 135 (*) 70 - 99 mg/dL    Comment 1 Notify RN     BASIC METABOLIC PANEL     Status: Abnormal   Collection Time   11/05/11 10:50 AM      Component Value Range Comment   Sodium 142  135 - 145 mEq/L    Potassium 3.2 (*) 3.5 - 5.1 mEq/L    Chloride 106  96 - 112 mEq/L    CO2 24  19 - 32 mEq/L    Glucose, Bld 277 (*) 70 - 99 mg/dL    BUN 59 (*) 6 - 23 mg/dL    Creatinine, Ser 8.41 (*) 0.50 - 1.10 mg/dL    Calcium 9.0  8.4 - 32.4 mg/dL    GFR calc non Af Amer 22 (*) >90 mL/min    GFR calc Af Amer 26 (*) >90 mL/min   GLUCOSE, CAPILLARY     Status: Abnormal   Collection Time   11/05/11 12:02 PM      Component Value Range Comment   Glucose-Capillary 231 (*) 70 - 99 mg/dL    Comment 1 Notify RN     GLUCOSE, CAPILLARY     Status: Abnormal   Collection Time   11/05/11  4:29 PM      Component Value Range Comment   Glucose-Capillary 136 (*) 70 - 99 mg/dL    Comment 1 Documented in Chart      Comment 2 Notify RN     URINALYSIS, ROUTINE W REFLEX MICROSCOPIC     Status: Abnormal   Collection Time   11/05/11  4:58 PM      Component Value Range Comment   Color, Urine YELLOW  YELLOW    APPearance CLOUDY (*) CLEAR    Specific Gravity, Urine 1.008  1.005 - 1.030    pH 5.5  5.0 - 8.0    Glucose, UA NEGATIVE  NEGATIVE mg/dL    Hgb urine dipstick LARGE (*) NEGATIVE    Bilirubin Urine NEGATIVE  NEGATIVE    Ketones, ur NEGATIVE  NEGATIVE mg/dL    Protein, ur NEGATIVE  NEGATIVE mg/dL     Urobilinogen, UA 0.2  0.0 - 1.0 mg/dL    Nitrite NEGATIVE  NEGATIVE    Leukocytes, UA LARGE (*) NEGATIVE   URINE MICROSCOPIC-ADD ON     Status: Abnormal   Collection Time   11/05/11  4:58 PM      Component Value Range Comment   Squamous Epithelial / LPF RARE  RARE    WBC, UA 21-50  <3 WBC/hpf    RBC / HPF 0-2  <3 RBC/hpf    Bacteria, UA MANY (*) RARE   BASIC METABOLIC PANEL     Status: Abnormal   Collection Time   11/06/11  5:00 AM      Component Value Range Comment   Sodium 141  135 - 145 mEq/L    Potassium 3.4 (*) 3.5 - 5.1 mEq/L  Chloride 104  96 - 112 mEq/L    CO2 23  19 - 32 mEq/L    Glucose, Bld 254 (*) 70 - 99 mg/dL    BUN 56 (*) 6 - 23 mg/dL    Creatinine, Ser 1.61 (*) 0.50 - 1.10 mg/dL    Calcium 9.3  8.4 - 09.6 mg/dL    GFR calc non Af Amer 21 (*) >90 mL/min    GFR calc Af Amer 24 (*) >90 mL/min   CBC     Status: Abnormal   Collection Time   11/06/11  5:00 AM      Component Value Range Comment   WBC 8.1  4.0 - 10.5 K/uL    RBC 2.54 (*) 3.87 - 5.11 MIL/uL    Hemoglobin 7.7 (*) 12.0 - 15.0 g/dL    HCT 04.5 (*) 40.9 - 46.0 %    MCV 92.5  78.0 - 100.0 fL    MCH 30.3  26.0 - 34.0 pg    MCHC 32.8  30.0 - 36.0 g/dL    RDW 81.1 (*) 91.4 - 15.5 %    Platelets 123 (*) 150 - 400 K/uL   GLUCOSE, CAPILLARY     Status: Abnormal   Collection Time   11/06/11  6:12 AM      Component Value Range Comment   Glucose-Capillary 241 (*) 70 - 99 mg/dL   GLUCOSE, CAPILLARY     Status: Abnormal   Collection Time   11/06/11 11:19 AM      Component Value Range Comment   Glucose-Capillary 149 (*) 70 - 99 mg/dL    Comment 1 Documented in Chart      Comment 2 Notify RN     GLUCOSE, CAPILLARY     Status: Abnormal   Collection Time   11/06/11  4:12 PM      Component Value Range Comment   Glucose-Capillary 177 (*) 70 - 99 mg/dL    Comment 1 Documented in Chart      Comment 2 Notify RN     CBC     Status: Abnormal   Collection Time   11/07/11  6:30 AM      Component Value Range Comment     WBC 8.7  4.0 - 10.5 K/uL    RBC 2.84 (*) 3.87 - 5.11 MIL/uL    Hemoglobin 8.6 (*) 12.0 - 15.0 g/dL    HCT 78.2 (*) 95.6 - 46.0 %    MCV 91.5  78.0 - 100.0 fL    MCH 30.3  26.0 - 34.0 pg    MCHC 33.1  30.0 - 36.0 g/dL    RDW 21.3 (*) 08.6 - 15.5 %    Platelets 172  150 - 400 K/uL   GLUCOSE, CAPILLARY     Status: Abnormal   Collection Time   11/07/11 11:42 AM      Component Value Range Comment   Glucose-Capillary 259 (*) 70 - 99 mg/dL    Comment 1 Documented in Chart      Comment 2 Notify RN          Patient condition at time of discharge/disposition: stable  Disposition-SNF   Follow up issues: 1. Anticoagulation: patient with poor follow-up with INR and presented with supratherapeutic INR, consider other options once outside of 2 weeks from GI bleed 2. HTN: patient with normal range BP in hospital on coreg 12.5 BID, diltiazem 120 daily, and torsemide.  Did not continue benazepril at this time due to normal range  pressures, likely will need this added back on in setting of HF.  Also, diltiazem dose cut in half.  This could possibly be increased to normal home dose for rate control.  Discharge follow up:  Follow-up Information    Follow up with Sanjuana Letters, MD. On 11/14/2011. (3:15 pm)    Contact information:   [address] [place] [address] [phone]          Discharge Instructions: Please refer to Patient Instructions section of EMR for full details.  Patient was counseled important signs and symptoms that should prompt return to medical care, changes in medications, dietary instructions, activity restrictions, and follow up appointments.  Significant instructions noted below:  Discharge Orders    Future Appointments: Provider: Department: Dept Phone: Center:   11/14/2011 3:15 PM Sanjuana Letters, MD [facility] [phone] [facility]   12/05/2011 10:00 AM Hillis Range, MD [facility] [phone] [facility]        Discharge Medications   Medication List     As of 11/07/2011 12:28 PM    START taking these medications         pantoprazole 40 MG tablet   Commonly known as: PROTONIX   Take 1 tablet (40 mg total) by mouth daily at 6 (six) AM.      CHANGE how you take these medications         diltiazem 120 MG 24 hr capsule   Commonly known as: CARDIZEM CD   Take 1 capsule (120 mg total) by mouth daily.   What changed: - medication strength - dose      CONTINUE taking these medications         carvedilol 12.5 MG tablet   Commonly known as: COREG      docusate sodium 100 MG capsule   Commonly known as: COLACE      ferrous sulfate 325 (65 FE) MG tablet      glimepiride 4 MG tablet   Commonly known as: AMARYL      hydrochlorothiazide 25 MG tablet   Commonly known as: HYDRODIURIL      NON FORMULARY      polyethylene glycol packet   Commonly known as: MIRALAX / GLYCOLAX      potassium chloride SA 20 MEQ tablet   Commonly known as: K-DUR,KLOR-CON   Take 1 tablet (20 mEq total) by mouth daily.      simvastatin 20 MG tablet   Commonly known as: ZOCOR      torsemide 100 MG tablet   Commonly known as: DEMADEX      STOP taking these medications         benazepril 10 MG tablet   Commonly known as: LOTENSIN      ibuprofen 200 MG tablet   Commonly known as: ADVIL,MOTRIN      omeprazole 20 MG capsule   Commonly known as: PRILOSEC      warfarin 2 MG tablet   Commonly known as: COUMADIN          Where to get your medications       Information on where to get these meds is not yet available. Ask your nurse or doctor.         diltiazem 120 MG 24 hr capsule   pantoprazole 40 MG tablet           Marikay Alar, MD of [facility] 11/07/2011 11:58 AM

## 2011-11-07 NOTE — Discharge Summary (Signed)
I discussed with Dr Sonnenberg.  I agree with their plans documented in their progress note for today.  

## 2011-11-07 NOTE — Progress Notes (Addendum)
Patient ID: [name], female   DOB: Jan 25, 1931, 78 y.o.   MRN: [id number]   Reasonable to stop coumadin given fall risk and GI bleed (suspect GI bleed primarily due to relatively heavy NSAID use).  It sounds like followup for INR has been sporadic at family practice.  She would not be a good candidate for novel anticoagulants given significant CKD.  Start ASA 81 in 10 days or so.  She can followup with me in the office.  No further recommendations at this time.  Call if any other cardiac issues develop.   [name] 11/07/2011 7:55 AM

## 2011-11-07 NOTE — Progress Notes (Signed)
Clinical Child psychotherapist (CSW) informed pt ready for [employer] today to [facility]. CSW prepared and placed pt dc packet in pt shadow chart. CSW provided RN with facility contact number to give report. CSW asked by pt to contact pt spouse to inform him of dc. CSW has contacted [facility] for a 1430 dc to [facility]. No further concerns.   CSW signing off.  Theresia Bough, MSW, Theresia Majors [phone]

## 2011-11-07 NOTE — Progress Notes (Signed)
Physical Therapy Treatment Patient Details Name: [name] MRN: [id number] DOB: Jun 01, 1930 Today's Date: 11/07/2011 Time: 4098-1191 PT Time Calculation (min): 40 min  PT Assessment / Plan / Recommendation Comments on Treatment Session  Pt adm with GI bleed.  Pt motivated.  Limited activity tolerance.    Follow Up Recommendations  Skilled nursing facility    Barriers to Discharge        Equipment Recommendations  None recommended by PT    Recommendations for Other Services    Frequency Min 2X/week   Plan Discharge plan remains appropriate;Frequency needs to be updated    Precautions / Restrictions Precautions Precautions: Fall Restrictions Weight Bearing Restrictions: No   Pertinent Vitals/Pain Pt amb with 2L O2    Mobility  Bed Mobility Supine to Sit: 5: Supervision;HOB elevated;With rails Sitting - Scoot to Edge of Bed: 5: Supervision Details for Bed Mobility Assistance: Incr time Transfers Sit to Stand: 4: Min assist;With upper extremity assist;From bed;From chair/3-in-1;With armrests Stand to Sit: 4: Min assist;With upper extremity assist;With armrests;To chair/3-in-1 Details for Transfer Assistance: verbal cues for hand placement and assist to bring hips up Ambulation/Gait Ambulation/Gait Assistance: 4: Min assist Ambulation Distance (Feet): 60 Feet Assistive device: Rolling walker Ambulation/Gait Assistance Details: verbal cues to stand more erect, look up, and stay closer to walker Gait Pattern: Step-through pattern;Decreased stride length;Trunk flexed Gait velocity: significantly decr General Gait Details: Pt required 3 standing rest breaks    Exercises     PT Diagnosis:    PT Problem List:   PT Treatment Interventions:     PT Goals Acute Rehab PT Goals PT Goal: Sit to Stand - Progress: Progressing toward goal PT Goal: Stand to Sit - Progress: Progressing toward goal PT Goal: Ambulate - Progress: Progressing toward goal  Visit Information  Last PT  Received On: 11/07/11 Assistance Needed: +1    Subjective Data  Subjective: "How long til I can walk on my own?"   Cognition  Overall Cognitive Status: Appears within functional limits for tasks assessed/performed Arousal/Alertness: Awake/alert Orientation Level: Appears intact for tasks assessed Behavior During Session: [facility] for tasks performed    Balance  Static Standing Balance Static Standing - Balance Support: During functional activity;Left upper extremity supported Static Standing - Level of Assistance: 5: Stand by assistance  End of Session PT - End of Session Equipment Utilized During Treatment: Gait belt Activity Tolerance: Patient limited by fatigue Patient left: in chair;with call bell/phone within reach Nurse Communication: Mobility status   GP     [facility] 11/07/2011, 11:48 AM  Skip Mayer PT [phone]

## 2011-11-07 NOTE — Progress Notes (Signed)
Pt has chosen [facility] for placement. Facility representative spoke to pt and addressed concerns pertaining to payment. CSW informed FMTS and RNCM. CSW to facilitate with dc to [facility] today if pt medically stable.  Theresia Bough, MSW, Theresia Majors [phone]

## 2011-11-07 NOTE — Progress Notes (Signed)
Patient ID: [name], female   DOB: 02-25-1930, 78 y.o.   MRN: [id number]          Family Medicine Teaching Service [facility] Progress Note     Patient name: [name] Medical record number: [id number] Date of birth: 07/08/1930 Age: 78 y.o. Gender: female    LOS: 6 days   Primary Care Provider: Sanjuana Letters, MD  Overnight Events: Patient states doing ok.  Had a BM yesterday. Complains of difficulty swallowing over the past month.  Patient is awaiting placement at SNF.  Objective: Vital signs in last 24 hours: BP 122/65  Pulse 86  Temp 97.9 F (36.6 C) (Oral)  Resp 18  Ht 5\' 5"  (1.651 m)  Wt 174 lb 9.7 oz (79.2 kg)  BMI 29.06 kg/m2  SpO2 94%    Physical Exam: Gen: NAD. Extremely pleasant women. HEENT: conjunctiva pale CV: irregular. No murmurs, gallops, or rubs  Res: Clear breath sounds. No rales. No wheezes. Abd: Soft. Non tender. Non distended. Ext/Musc: No edema  Labs/Studies:  Results for orders placed during the hospital encounter of 11/01/11 (from the past 24 hour(s))  GLUCOSE, CAPILLARY     Status: Abnormal   Collection Time   11/06/11 11:19 AM      Component Value Range   Glucose-Capillary 149 (*) 70 - 99 mg/dL   Comment 1 Documented in Chart     Comment 2 Notify RN    GLUCOSE, CAPILLARY     Status: Abnormal   Collection Time   11/06/11  4:12 PM      Component Value Range   Glucose-Capillary 177 (*) 70 - 99 mg/dL   Comment 1 Documented in Chart     Comment 2 Notify RN    CBC     Status: Abnormal   Collection Time   11/07/11  6:30 AM      Component Value Range   WBC 8.7  4.0 - 10.5 K/uL   RBC 2.84 (*) 3.87 - 5.11 MIL/uL   Hemoglobin 8.6 (*) 12.0 - 15.0 g/dL   HCT 78.4 (*) 69.6 - 29.5 %   MCV 91.5  78.0 - 100.0 fL   MCH 30.3  26.0 - 34.0 pg   MCHC 33.1  30.0 - 36.0 g/dL   RDW 28.4 (*) 13.2 - 44.0 %   Platelets 172  150 - 400 K/uL     Medications: Scheduled Meds:    . alteplase  2 mg Intracatheter Once  . atorvastatin  10 mg Oral  q1800  . carvedilol  12.5 mg Oral BID WC  . diltiazem  120 mg Oral Daily  . ferrous sulfate  325 mg Oral Q breakfast  . loratadine  10 mg Oral Daily  . pantoprazole  40 mg Oral Q0600  . sodium chloride  10-40 mL Intracatheter Q12H  . torsemide  100 mg Oral Daily   Continuous Infusions:    . sodium chloride 20 mL/hr (11/06/11 0957)   PRN Meds:.sodium chloride, acetaminophen, acetaminophen, polyethylene glycol, sodium chloride, traZODone  Echo: 55-60% EF   Assessment/Plan: [name] is a 78 y.o. year old female with a history of DMII, HLD, HTN, MI, afib on coumadin, and CKD IV who presents with a 3-4 week history of rectal bleeding and recent increase in bruising.   1. Rectal bleeding: patient with history of diverticulosis. EGD revealed non-bleeding gastric ulcer, possible cause of GI bleed with INR supratherapeutic at 9.51 at admission.   - HgB: stable at 8.6 -  INR: will allow to stay subtherapeutic as risk of continued GI bleed outweighs risk of stroke in this setting - Central line placed  - GI consulted recs continue PPI, avoid NSAIDs. GI has signed off. - H. Pylori negative - Last bloody stool: Saturday evening - EGD: stomach with antral erosions and small clean based ulcer without bleeding.  - Follow Potassium, consider replacement if no wnl-currently 3.4 - miralax for constipation  2. Supratherapeutic INR: With PO vit K 2.5 mg and IV vit K 10 mg. Will continue to hold coumadin as patient has risk of bleeding.  Consider restarting anticoagulation in 2 weeks from the bleeding event.  Given patients history of poor follow-up recently would recommend against coumadin and consider other options.  3. DM: glucose on admission 121.  Stable in the 130's-240's. Will consider SSI if glucose over 200. Held home glipizide.   4. HTN: BP currently 122/65.  BP currently stable.  Will continue to watch hemodynamic status. Coreg 12.5 and dilt 120 started. Metoprolol 2.5 continued.   5.  Afib: patient with pacemaker in place. Pulse currently 86. Rate controlled on dilt 120.  Marland Kitchen  6: CKD stage IV: GFR of 13 on admission. Creatinine currently 2.13 so back around baseline. Pt's baseline around 2.3. Pt CO2 and K wnl.  Anemia panel reveals mixed picture, likely components of iron deficiency and anemia of chronic disease.  Given nulecit 125 mg for 2 doses while in hospital. Also, received DDAVP x1. Retic count 2.9%. Patient will likely need iron replacement as outpatient.  7. Chest pain: resolved. Ischemia likely resulting from anemia.  Will maintain Hb greater than 8.   8. Diastolic HF: torsemide restarted yesterday. Don't appear to be recording output.  9. Difficulty swallowing: patient states this has been occuring for some time and that small pieces of food make her feel a choking sensation. Will get swallow evaluation.    10. FEN/GI: - kvo - heart healthy diet   11. Prophylaxis: SCDs, patient was actively bleeding no anticoagulation; Loratadine for itch.  Sleep aide: trazodone QHS PRN  12. Dispo: pending improvement in bleeding and stable hemodynamically, PT/OT rec SNF  Marikay Alar, MD PGY1, FPTS

## 2011-11-14 ENCOUNTER — Inpatient Hospital Stay: Payer: Medicare Other | Admitting: Family Medicine

## 2011-11-28 ENCOUNTER — Encounter: Payer: Self-pay | Admitting: *Deleted

## 2011-12-02 ENCOUNTER — Inpatient Hospital Stay (HOSPITAL_COMMUNITY)
Admission: EM | Admit: 2011-12-02 | Discharge: 2011-12-07 | DRG: 683 | Disposition: A | Discharge status: Skilled Nursing Facility | Payer: Medicare Other | Attending: Family Medicine | Admitting: Family Medicine

## 2011-12-02 ENCOUNTER — Encounter (HOSPITAL_COMMUNITY): Payer: Self-pay | Admitting: *Deleted

## 2011-12-02 DIAGNOSIS — K219 Gastro-esophageal reflux disease without esophagitis: Secondary | ICD-10-CM

## 2011-12-02 DIAGNOSIS — A498 Other bacterial infections of unspecified site: Secondary | ICD-10-CM | POA: Diagnosis present

## 2011-12-02 DIAGNOSIS — M79609 Pain in unspecified limb: Secondary | ICD-10-CM | POA: Diagnosis present

## 2011-12-02 DIAGNOSIS — R5381 Other malaise: Secondary | ICD-10-CM | POA: Diagnosis present

## 2011-12-02 DIAGNOSIS — I2510 Atherosclerotic heart disease of native coronary artery without angina pectoris: Secondary | ICD-10-CM

## 2011-12-02 DIAGNOSIS — K5730 Diverticulosis of large intestine without perforation or abscess without bleeding: Secondary | ICD-10-CM

## 2011-12-02 DIAGNOSIS — F329 Major depressive disorder, single episode, unspecified: Secondary | ICD-10-CM | POA: Diagnosis present

## 2011-12-02 DIAGNOSIS — R0601 Orthopnea: Secondary | ICD-10-CM

## 2011-12-02 DIAGNOSIS — M5137 Other intervertebral disc degeneration, lumbosacral region: Secondary | ICD-10-CM

## 2011-12-02 DIAGNOSIS — IMO0002 Reserved for concepts with insufficient information to code with codable children: Secondary | ICD-10-CM

## 2011-12-02 DIAGNOSIS — R32 Unspecified urinary incontinence: Secondary | ICD-10-CM

## 2011-12-02 DIAGNOSIS — R252 Cramp and spasm: Secondary | ICD-10-CM

## 2011-12-02 DIAGNOSIS — Z8673 Personal history of transient ischemic attack (TIA), and cerebral infarction without residual deficits: Secondary | ICD-10-CM

## 2011-12-02 DIAGNOSIS — D6489 Other specified anemias: Secondary | ICD-10-CM

## 2011-12-02 DIAGNOSIS — E1142 Type 2 diabetes mellitus with diabetic polyneuropathy: Secondary | ICD-10-CM | POA: Diagnosis present

## 2011-12-02 DIAGNOSIS — M51379 Other intervertebral disc degeneration, lumbosacral region without mention of lumbar back pain or lower extremity pain: Secondary | ICD-10-CM

## 2011-12-02 DIAGNOSIS — I129 Hypertensive chronic kidney disease with stage 1 through stage 4 chronic kidney disease, or unspecified chronic kidney disease: Secondary | ICD-10-CM | POA: Diagnosis present

## 2011-12-02 DIAGNOSIS — E785 Hyperlipidemia, unspecified: Secondary | ICD-10-CM | POA: Diagnosis present

## 2011-12-02 DIAGNOSIS — N390 Urinary tract infection, site not specified: Secondary | ICD-10-CM | POA: Diagnosis present

## 2011-12-02 DIAGNOSIS — M898X9 Other specified disorders of bone, unspecified site: Secondary | ICD-10-CM | POA: Diagnosis present

## 2011-12-02 DIAGNOSIS — I6789 Other cerebrovascular disease: Secondary | ICD-10-CM

## 2011-12-02 DIAGNOSIS — E876 Hypokalemia: Secondary | ICD-10-CM | POA: Diagnosis present

## 2011-12-02 DIAGNOSIS — Z950 Presence of cardiac pacemaker: Secondary | ICD-10-CM

## 2011-12-02 DIAGNOSIS — E118 Type 2 diabetes mellitus with unspecified complications: Secondary | ICD-10-CM

## 2011-12-02 DIAGNOSIS — K921 Melena: Secondary | ICD-10-CM

## 2011-12-02 DIAGNOSIS — E1149 Type 2 diabetes mellitus with other diabetic neurological complication: Secondary | ICD-10-CM

## 2011-12-02 DIAGNOSIS — Z515 Encounter for palliative care: Secondary | ICD-10-CM

## 2011-12-02 DIAGNOSIS — I8310 Varicose veins of unspecified lower extremity with inflammation: Secondary | ICD-10-CM

## 2011-12-02 DIAGNOSIS — I4891 Unspecified atrial fibrillation: Secondary | ICD-10-CM

## 2011-12-02 DIAGNOSIS — Z85820 Personal history of malignant melanoma of skin: Secondary | ICD-10-CM

## 2011-12-02 DIAGNOSIS — R29898 Other symptoms and signs involving the musculoskeletal system: Secondary | ICD-10-CM | POA: Diagnosis present

## 2011-12-02 DIAGNOSIS — E870 Hyperosmolality and hypernatremia: Secondary | ICD-10-CM | POA: Diagnosis present

## 2011-12-02 DIAGNOSIS — N179 Acute kidney failure, unspecified: Principal | ICD-10-CM | POA: Diagnosis present

## 2011-12-02 DIAGNOSIS — I252 Old myocardial infarction: Secondary | ICD-10-CM

## 2011-12-02 DIAGNOSIS — M1990 Unspecified osteoarthritis, unspecified site: Secondary | ICD-10-CM

## 2011-12-02 DIAGNOSIS — I509 Heart failure, unspecified: Secondary | ICD-10-CM | POA: Diagnosis present

## 2011-12-02 DIAGNOSIS — I5032 Chronic diastolic (congestive) heart failure: Secondary | ICD-10-CM

## 2011-12-02 DIAGNOSIS — R197 Diarrhea, unspecified: Secondary | ICD-10-CM | POA: Diagnosis not present

## 2011-12-02 DIAGNOSIS — R195 Other fecal abnormalities: Secondary | ICD-10-CM

## 2011-12-02 DIAGNOSIS — N189 Chronic kidney disease, unspecified: Secondary | ICD-10-CM

## 2011-12-02 DIAGNOSIS — I498 Other specified cardiac arrhythmias: Secondary | ICD-10-CM

## 2011-12-02 DIAGNOSIS — M4120 Other idiopathic scoliosis, site unspecified: Secondary | ICD-10-CM

## 2011-12-02 DIAGNOSIS — R5383 Other fatigue: Secondary | ICD-10-CM

## 2011-12-02 DIAGNOSIS — M47817 Spondylosis without myelopathy or radiculopathy, lumbosacral region: Secondary | ICD-10-CM | POA: Diagnosis present

## 2011-12-02 DIAGNOSIS — R55 Syncope and collapse: Secondary | ICD-10-CM

## 2011-12-02 DIAGNOSIS — H3530 Unspecified macular degeneration: Secondary | ICD-10-CM

## 2011-12-02 DIAGNOSIS — I10 Essential (primary) hypertension: Secondary | ICD-10-CM

## 2011-12-02 DIAGNOSIS — E781 Pure hyperglyceridemia: Secondary | ICD-10-CM

## 2011-12-02 DIAGNOSIS — M542 Cervicalgia: Secondary | ICD-10-CM

## 2011-12-02 DIAGNOSIS — D689 Coagulation defect, unspecified: Secondary | ICD-10-CM

## 2011-12-02 DIAGNOSIS — H9190 Unspecified hearing loss, unspecified ear: Secondary | ICD-10-CM

## 2011-12-02 DIAGNOSIS — Z23 Encounter for immunization: Secondary | ICD-10-CM

## 2011-12-02 DIAGNOSIS — K259 Gastric ulcer, unspecified as acute or chronic, without hemorrhage or perforation: Secondary | ICD-10-CM

## 2011-12-02 DIAGNOSIS — Z9181 History of falling: Secondary | ICD-10-CM

## 2011-12-02 DIAGNOSIS — Z7901 Long term (current) use of anticoagulants: Secondary | ICD-10-CM

## 2011-12-02 DIAGNOSIS — F3289 Other specified depressive episodes: Secondary | ICD-10-CM

## 2011-12-02 DIAGNOSIS — E7800 Pure hypercholesterolemia, unspecified: Secondary | ICD-10-CM

## 2011-12-02 DIAGNOSIS — N184 Chronic kidney disease, stage 4 (severe): Secondary | ICD-10-CM

## 2011-12-02 DIAGNOSIS — D62 Acute posthemorrhagic anemia: Secondary | ICD-10-CM

## 2011-12-02 DIAGNOSIS — E119 Type 2 diabetes mellitus without complications: Secondary | ICD-10-CM

## 2011-12-02 LAB — URINE MICROSCOPIC-ADD ON

## 2011-12-02 LAB — PROTIME-INR
INR: 1.23 (ref 0.00–1.49)
Prothrombin Time: 15.3 s — ABNORMAL HIGH (ref 11.6–15.2)

## 2011-12-02 LAB — URINALYSIS, ROUTINE W REFLEX MICROSCOPIC
Bilirubin Urine: NEGATIVE
Glucose, UA: NEGATIVE mg/dL
Ketones, ur: NEGATIVE mg/dL
Nitrite: NEGATIVE
Protein, ur: 30 mg/dL — AB
Specific Gravity, Urine: 1.012 (ref 1.005–1.030)
Urobilinogen, UA: 0.2 mg/dL (ref 0.0–1.0)
pH: 7 (ref 5.0–8.0)

## 2011-12-02 LAB — CBC
HCT: 30.1 % — ABNORMAL LOW (ref 36.0–46.0)
Hemoglobin: 10.2 g/dL — ABNORMAL LOW (ref 12.0–15.0)
Hemoglobin: 10.1 g/dL — ABNORMAL LOW (ref 12.0–15.0)
MCH: 30 pg (ref 26.0–34.0)
MCH: 30.2 pg (ref 26.0–34.0)
MCHC: 33.7 g/dL (ref 30.0–36.0)
MCHC: 33.6 g/dL (ref 30.0–36.0)
MCV: 90.1 fL (ref 78.0–100.0)
Platelets: 155 10*3/uL (ref 150–400)
RBC: 3.34 MIL/uL — ABNORMAL LOW (ref 3.87–5.11)
RDW: 16.3 % — ABNORMAL HIGH (ref 11.5–15.5)
WBC: 6.8 K/uL (ref 4.0–10.5)

## 2011-12-02 LAB — GLUCOSE, CAPILLARY: Glucose-Capillary: 114 mg/dL — ABNORMAL HIGH (ref 70–99)

## 2011-12-02 LAB — BASIC METABOLIC PANEL
BUN: 115 mg/dL — ABNORMAL HIGH (ref 6–23)
Calcium: 9.8 mg/dL (ref 8.4–10.5)
GFR calc non Af Amer: 10 mL/min — ABNORMAL LOW (ref >90)
Glucose, Bld: 121 mg/dL — ABNORMAL HIGH (ref 70–99)
Sodium: 138 mEq/L (ref 135–145)

## 2011-12-02 LAB — CREATININE, SERUM
Creatinine, Ser: 3.62 mg/dL — ABNORMAL HIGH (ref 0.50–1.10)
GFR calc Af Amer: 13 mL/min — ABNORMAL LOW (ref >90)
GFR calc non Af Amer: 11 mL/min — ABNORMAL LOW (ref >90)

## 2011-12-02 MED ORDER — DOCUSATE SODIUM 100 MG PO CAPS
100.0000 mg | ORAL_CAPSULE | Freq: Two times a day (BID) | ORAL | Status: DC
  Administered 2011-12-02 – 2011-12-03 (×2): 100 mg via ORAL
  Filled 2011-12-02 (×11): qty 1

## 2011-12-02 MED ORDER — POTASSIUM CHLORIDE CRYS ER 20 MEQ PO TBCR
40.0000 meq | EXTENDED_RELEASE_TABLET | Freq: Once | ORAL | Status: AC
  Administered 2011-12-02: 40 meq via ORAL
  Filled 2011-12-02: qty 2

## 2011-12-02 MED ORDER — ACETAMINOPHEN 325 MG PO TABS
650.0000 mg | ORAL_TABLET | Freq: Four times a day (QID) | ORAL | Status: DC | PRN
  Administered 2011-12-03 (×2): 650 mg via ORAL
  Filled 2011-12-02 (×2): qty 2

## 2011-12-02 MED ORDER — INSULIN ASPART 100 UNIT/ML ~~LOC~~ SOLN
0.0000 [IU] | Freq: Three times a day (TID) | SUBCUTANEOUS | Status: DC
  Administered 2011-12-03: 1 [IU] via SUBCUTANEOUS
  Administered 2011-12-03: 2 [IU] via SUBCUTANEOUS
  Administered 2011-12-03: 3 [IU] via SUBCUTANEOUS
  Administered 2011-12-04: 1 [IU] via SUBCUTANEOUS
  Administered 2011-12-04: 2 [IU] via SUBCUTANEOUS
  Administered 2011-12-04: 3 [IU] via SUBCUTANEOUS
  Administered 2011-12-05: 19:00:00 via SUBCUTANEOUS
  Administered 2011-12-05: 3 [IU] via SUBCUTANEOUS
  Administered 2011-12-05: 1 [IU] via SUBCUTANEOUS
  Administered 2011-12-06: 3 [IU] via SUBCUTANEOUS
  Administered 2011-12-06: 2 [IU] via SUBCUTANEOUS
  Administered 2011-12-07: 5 [IU] via SUBCUTANEOUS

## 2011-12-02 MED ORDER — SIMVASTATIN 20 MG PO TABS: 20.0000 mg | ORAL_TABLET | Freq: Every day | ORAL | Status: DC

## 2011-12-02 MED ORDER — HEPARIN SODIUM (PORCINE) 5000 UNIT/ML IJ SOLN
5000.0000 [IU] | Freq: Three times a day (TID) | INTRAMUSCULAR | Status: DC
  Administered 2011-12-02 – 2011-12-07 (×13): 5000 [IU] via SUBCUTANEOUS
  Filled 2011-12-02 (×17): qty 1

## 2011-12-02 MED ORDER — SODIUM CHLORIDE 0.9 % IV SOLN
Freq: Once | INTRAVENOUS | Status: AC
  Administered 2011-12-02: 16:00:00 via INTRAVENOUS

## 2011-12-02 MED ORDER — ACETAMINOPHEN 650 MG RE SUPP: 650.0000 mg | Freq: Four times a day (QID) | RECTAL | Status: DC | PRN

## 2011-12-02 MED ORDER — METRONIDAZOLE 500 MG PO TABS
500.0000 mg | ORAL_TABLET | Freq: Three times a day (TID) | ORAL | Status: DC
  Administered 2011-12-02 – 2011-12-04 (×5): 500 mg via ORAL
  Filled 2011-12-02 (×8): qty 1

## 2011-12-02 MED ORDER — ATORVASTATIN CALCIUM 10 MG PO TABS
10.0000 mg | ORAL_TABLET | Freq: Every day | ORAL | Status: DC
  Administered 2011-12-03 – 2011-12-06 (×4): 10 mg via ORAL
  Filled 2011-12-02 (×6): qty 1

## 2011-12-02 MED ORDER — ONDANSETRON HCL 4 MG/2ML IJ SOLN
4.0000 mg | Freq: Four times a day (QID) | INTRAMUSCULAR | Status: DC | PRN
  Filled 2011-12-02: qty 2

## 2011-12-02 MED ORDER — ONDANSETRON HCL 4 MG PO TABS: 4.0000 mg | ORAL_TABLET | Freq: Four times a day (QID) | ORAL | Status: DC | PRN

## 2011-12-02 MED ORDER — PANTOPRAZOLE SODIUM 40 MG PO TBEC
40.0000 mg | DELAYED_RELEASE_TABLET | Freq: Every day | ORAL | Status: DC
  Administered 2011-12-03 – 2011-12-07 (×5): 40 mg via ORAL
  Filled 2011-12-02 (×6): qty 1

## 2011-12-02 MED ORDER — SODIUM CHLORIDE 0.9 % IV SOLN
INTRAVENOUS | Status: DC
  Administered 2011-12-02 – 2011-12-04 (×4): via INTRAVENOUS

## 2011-12-02 MED ORDER — SODIUM CHLORIDE 0.9 % IV BOLUS (SEPSIS)
250.0000 mL | Freq: Once | INTRAVENOUS | Status: AC
  Administered 2011-12-02: 250 mL via INTRAVENOUS

## 2011-12-02 MED ORDER — DILTIAZEM HCL ER COATED BEADS 120 MG PO CP24
120.0000 mg | ORAL_CAPSULE | Freq: Every day | ORAL | Status: DC
  Administered 2011-12-02 – 2011-12-07 (×6): 120 mg via ORAL
  Filled 2011-12-02 (×7): qty 1

## 2011-12-02 MED ORDER — PNEUMOCOCCAL VAC POLYVALENT 25 MCG/0.5ML IJ INJ
0.5000 mL | INJECTION | INTRAMUSCULAR | Status: AC
  Filled 2011-12-02: qty 0.5

## 2011-12-02 MED ORDER — PIPERACILLIN-TAZOBACTAM 3.375 G IVPB
3.3750 g | Freq: Three times a day (TID) | INTRAVENOUS | Status: DC
  Administered 2011-12-02 – 2011-12-03 (×2): 3.375 g via INTRAVENOUS
  Filled 2011-12-02 (×4): qty 50

## 2011-12-02 NOTE — Progress Notes (Signed)
VASCULAR LAB PRELIMINARY  PRELIMINARY  PRELIMINARY  PRELIMINARY  Bilateral lower extremity venous Dopplers completed.    Preliminary report:  There is no DVT or SVT noted in the bilateral lower extremities.  [name], 12/02/2011, 12:23 PM
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
## 2011-12-02 NOTE — ED Provider Notes (Signed)
Medical screening examination/treatment/procedure(s) were conducted as a shared visit with non-physician practitioner(s) and myself.  I personally evaluated the patient during the encounter  Pt with lower extremity generalized pain, Neg Homan's, inability to walk, now in renal failure likely due to inability to ambulate, limited help at home.  No redness to suggest infection.  DVT studies show no DVT.  Good peripheral pulses.  Discussed with [facility] to address renal insuff, likely needs placement or home health arrangements.    Gavin Pound. Mikell Camp, MD 12/02/11 1531

## 2011-12-03 ENCOUNTER — Inpatient Hospital Stay (HOSPITAL_COMMUNITY): Payer: Medicare Other

## 2011-12-03 LAB — BASIC METABOLIC PANEL
GFR calc Af Amer: 14 mL/min — ABNORMAL LOW (ref >90)
GFR calc non Af Amer: 12 mL/min — ABNORMAL LOW (ref >90)
Glucose, Bld: 113 mg/dL — ABNORMAL HIGH (ref 70–99)
Potassium: 3 mEq/L — ABNORMAL LOW (ref 3.5–5.1)
Sodium: 138 mEq/L (ref 135–145)

## 2011-12-03 LAB — TSH: TSH: 1.115 u[IU]/mL (ref 0.350–4.500)

## 2011-12-03 LAB — BASIC METABOLIC PANEL WITH GFR
BUN: 104 mg/dL — ABNORMAL HIGH (ref 6–23)
CO2: 31 meq/L (ref 19–32)
Calcium: 9.3 mg/dL (ref 8.4–10.5)
Chloride: 94 meq/L — ABNORMAL LOW (ref 96–112)
Creatinine, Ser: 3.39 mg/dL — ABNORMAL HIGH (ref 0.50–1.10)

## 2011-12-03 LAB — HEMOGLOBIN A1C
Hgb A1c MFr Bld: 6 % — ABNORMAL HIGH (ref <5.7)
Mean Plasma Glucose: 126 mg/dL — ABNORMAL HIGH (ref <117)

## 2011-12-03 LAB — CLOSTRIDIUM DIFFICILE BY PCR: Toxigenic C. Difficile by PCR: NEGATIVE

## 2011-12-03 LAB — GLUCOSE, CAPILLARY: Glucose-Capillary: 236 mg/dL — ABNORMAL HIGH (ref 70–99)

## 2011-12-03 MED ORDER — PIPERACILLIN-TAZOBACTAM IN DEX 2-0.25 GM/50ML IV SOLN
2.2500 g | Freq: Three times a day (TID) | INTRAVENOUS | Status: DC
  Administered 2011-12-03 – 2011-12-04 (×3): 2.25 g via INTRAVENOUS
  Filled 2011-12-03 (×5): qty 50

## 2011-12-03 MED ORDER — OFF THE BEAT BOOK
Freq: Once | Status: DC
  Filled 2011-12-03: qty 1

## 2011-12-03 MED ORDER — ADULT MULTIVITAMIN W/MINERALS CH
1.0000 | ORAL_TABLET | Freq: Every day | ORAL | Status: DC
  Administered 2011-12-03 – 2011-12-07 (×5): 1 via ORAL
  Filled 2011-12-03 (×5): qty 1

## 2011-12-03 MED ORDER — ENSURE COMPLETE PO LIQD
237.0000 mL | Freq: Two times a day (BID) | ORAL | Status: DC
  Administered 2011-12-03 – 2011-12-05 (×5): 237 mL via ORAL
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
## 2011-12-03 NOTE — Progress Notes (Signed)
Family Medicine Teaching Service Daily Progress Note Service Page: [phone]  Patient Assessment: Generalized weakness, nausea and diarrhea  Subjective: She ate a bagel but vomited it up. Currently not nauseated. Had diarrhea this AM and yesterday. Daughter who saw her 2 days ago is now ill with vomiting and diarrhea. Says she had burning on urination before admission, but no urgency, in fact decreased urination.   Objective: Temp:  [97 F (36.1 C)-98.6 F (37 C)] 97.8 F (36.6 C) (10/21 0949) Pulse Rate:  [61-92] 85  (10/21 0949) Resp:  [18-24] 18  (10/21 0949) BP: (92-136)/(43-85) 122/61 mmHg (10/21 0949) SpO2:  [97 %-100 %] 100 % (10/21 0949) Weight:  [159 lb 13.3 oz (72.5 kg)-175 lb (79.379 kg)] 159 lb 13.3 oz (72.5 kg) (10/20 2104) Exam: General: Chronically ill appearing, but NAD Cardiovascular: RR Respiratory: lungs clear Abdomen: Normal BS, soft, but tender R side with minimal guarding. No distension Extremities: No edema. complains of pain with touching legs, but normal range of motion of hips.   I have reviewed the patient's medications, labs, imaging, and diagnostic testing.  Notable results are summarized below.  CBC BMET   Lab 12/02/11 1828 12/02/11 1242  WBC 6.8 8.3  HGB 10.1* 10.2*  HCT 30.1* 30.3*  PLT 155 161    Lab 12/03/11 0605 12/02/11 1828 12/02/11 1242  NA 138 -- 138  K 3.0* -- 3.3*  CL 94* -- 90*  CO2 31 -- 30  BUN 104* -- 115*  CREATININE 3.39* 3.62* 3.88*  GLUCOSE 113* -- 121*  CALCIUM 9.3 -- 9.8     Imaging/Diagnostic Tests: X-rays of hips and pelvis showed only degenerative joint disease  Plan: 1. continue IV fluids. Urine culture and C. Dif are pending 2. FEN/GI: diet as tolerated 3. CRF/ARF CMET in AM with prealbumin 4. Dispo: She desires NH placement, consider [facility] since she doesn't want to return to [facility] 5. Code Status: Full Code 6. Weakness: Check vitamin D. PT/OT evaluation pending  Tobin Chad,  MD 12/03/2011, 10:26 AM

## 2011-12-04 LAB — COMPREHENSIVE METABOLIC PANEL
ALT: 11 U/L (ref 0–35)
AST: 13 U/L (ref 0–37)
Albumin: 2.8 g/dL — ABNORMAL LOW (ref 3.5–5.2)
Alkaline Phosphatase: 73 U/L (ref 39–117)
Chloride: 102 mEq/L (ref 96–112)
Potassium: 3 mEq/L — ABNORMAL LOW (ref 3.5–5.1)
Total Bilirubin: 0.3 mg/dL (ref 0.3–1.2)

## 2011-12-04 LAB — CBC
Hemoglobin: 8.9 g/dL — ABNORMAL LOW (ref 12.0–15.0)
RBC: 2.96 MIL/uL — ABNORMAL LOW (ref 3.87–5.11)

## 2011-12-04 LAB — BASIC METABOLIC PANEL
CO2: 29 mEq/L (ref 19–32)
GFR calc non Af Amer: 16 mL/min — ABNORMAL LOW (ref >90)
Glucose, Bld: 289 mg/dL — ABNORMAL HIGH (ref 70–99)
Potassium: 3.3 mEq/L — ABNORMAL LOW (ref 3.5–5.1)
Sodium: 144 mEq/L (ref 135–145)

## 2011-12-04 LAB — URINE CULTURE: Colony Count: >=100000

## 2011-12-04 LAB — GLUCOSE, CAPILLARY
Glucose-Capillary: 163 mg/dL — ABNORMAL HIGH (ref 70–99)
Glucose-Capillary: 137 mg/dL — ABNORMAL HIGH (ref 70–99)
Glucose-Capillary: 230 mg/dL — ABNORMAL HIGH (ref 70–99)
Glucose-Capillary: 227 mg/dL — ABNORMAL HIGH (ref 70–99)

## 2011-12-04 LAB — MAGNESIUM: Magnesium: 1.9 mg/dL (ref 1.5–2.5)

## 2011-12-04 MED ORDER — POTASSIUM CHLORIDE CRYS ER 20 MEQ PO TBCR
40.0000 meq | EXTENDED_RELEASE_TABLET | Freq: Once | ORAL | Status: AC
  Administered 2011-12-04: 40 meq via ORAL

## 2011-12-04 MED ORDER — POTASSIUM CHLORIDE CRYS ER 20 MEQ PO TBCR
40.0000 meq | EXTENDED_RELEASE_TABLET | Freq: Once | ORAL | Status: AC
  Administered 2011-12-04: 40 meq via ORAL
  Filled 2011-12-04: qty 2

## 2011-12-04 MED ORDER — GI COCKTAIL ~~LOC~~
30.0000 mL | Freq: Once | ORAL | Status: AC
  Administered 2011-12-04: 30 mL via ORAL
  Filled 2011-12-04: qty 30

## 2011-12-04 MED ORDER — POTASSIUM CHLORIDE CRYS ER 20 MEQ PO TBCR
40.0000 meq | EXTENDED_RELEASE_TABLET | Freq: Once | ORAL | Status: AC
  Administered 2011-12-04: 40 meq via ORAL
  Filled 2011-12-04 (×2): qty 2

## 2011-12-04 NOTE — Accreditation Note (Addendum)
Patient complain of CP, described as burning, pt pointed at epigastric area. Patient asked for something for indigestion. MD notified.  Will give GI cocktail x one dose now per MD order. Steele Berg
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
## 2011-12-04 NOTE — Progress Notes (Signed)
I idiscussed the care plan with Dr. Adriana Simas and the [facility] team and agree with assessment and plan as documented in the progress note for today. Since she wasn't symptomatic when the urine was tested we decided not to continue antibiotics for UTI.     Calan Doren A. Sheffield Slider, MD Family Medicine Teaching Service Attending  12/04/2011 9:38 PM

## 2011-12-04 NOTE — Clinical Social Work Note (Signed)
CSW advised by physical therapist Jeanice Lim that patient is in agreement with going to a SNF for rehab and does wants [facility]. CSW will follow-up with patient and initiate bed search on 10/23.  Genelle Bal, MSW, LCSW [phone]

## 2011-12-04 NOTE — Progress Notes (Signed)
Physical Therapy Treatment Patient Details Name: [name] MRN: [id number] DOB: 12/20/1930 Today's Date: 12/04/2011 Time: 7829-5621 PT Time Calculation (min): 23 min  PT Assessment / Plan / Recommendation Comments on Treatment Session  Admitted with weakness, falls, diarrhea; Continuing to require physical assist for mobility, and activity tol limited today by frequent loose stooling; Pt reports today she is interested in going to [facility] for rehab; This therapist is in agreement    Follow Up Recommendations  Post acute inpatient     Does the patient have the potential to tolerate intense rehabilitation  No, Recommend SNF  Barriers to Discharge        Equipment Recommendations  Rolling walker with 5" wheels;3 in 1 bedside comode;Tub/shower bench    Recommendations for Other Services    Frequency Min 3X/week   Plan Discharge plan remains appropriate    Precautions / Restrictions Precautions Precautions: Fall Restrictions Other Position/Activity Restrictions: lots of loose stool during transfers today   Pertinent Vitals/Pain no apparent distress Pt concerned about her frequent stooling    Mobility  Bed Mobility Bed Mobility: Supine to Sit Supine to Sit: 5: Supervision;HOB flat;With rails Details for Bed Mobility Assistance: Pretty smooth transition Transfers Transfers: Sit to Stand;Stand to Sit Sit to Stand: 3: Mod assist;With upper extremity assist;From bed Stand to Sit: 3: Mod assist;With upper extremity assist;To chair/3-in-1 Details for Transfer Assistance: Cues for safety and hand placement; Requiring significant physical assist for sit to stand anti-gravity; Quite dependent on momentum  as well Ambulation/Gait Ambulation/Gait Assistance: 1: +2 Total assist Ambulation/Gait: Patient Percentage: 60% Ambulation Distance (Feet): 8 Feet (4+4; bed to bsc; bsc to recliner) Assistive device: Rolling walker Ambulation/Gait Assistance Details: Cues to fully weight shift  into stance Right and Left to unweigh advancing limb; Noted tendency to hyperextend L knee in stance; shuffled gait Gait Pattern: Shuffle    Exercises     PT Diagnosis:    PT Problem List:   PT Treatment Interventions:     PT Goals Acute Rehab PT Goals Time For Goal Achievement: 12/17/11 Potential to Achieve Goals: Fair Pt will go Supine/Side to Sit: with modified independence PT Goal: Supine/Side to Sit - Progress: Progressing toward goal Pt will go Sit to Supine/Side: with modified independence Pt will go Sit to Stand: with modified independence PT Goal: Sit to Stand - Progress: Progressing toward goal Pt will go Stand to Sit: with modified independence PT Goal: Stand to Sit - Progress: Progressing toward goal Pt will Ambulate: >150 feet;with modified independence;with rolling walker PT Goal: Ambulate - Progress: Progressing toward goal (Slowly)  Visit Information  Last PT Received On: 12/04/11 Assistance Needed: +2 (helpful for O2, IV)    Subjective Data  Subjective: Wanting to get to commode Patient Stated Goal: Today pt is agreeable to rehab at SNF   Cognition  Overall Cognitive Status: Impaired Area of Impairment: Awareness of deficits Arousal/Alertness: Awake/alert Orientation Level: Appears intact for tasks assessed Behavior During Session: [facility] for tasks performed Awareness of Deficits: decreased aawreness of deficits affecting functionality    Balance     End of Session PT - End of Session Equipment Utilized During Treatment: Gait belt Activity Tolerance: Patient limited by fatigue Patient left: in chair;with call bell/phone within reach Nurse Communication: Mobility status   GP     Van Clines [facility] [place], [place] [phone]  12/04/2011, 4:12 PM
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
## 2011-12-05 ENCOUNTER — Encounter: Payer: Medicare Other | Admitting: Internal Medicine

## 2011-12-05 LAB — GLUCOSE, CAPILLARY
Glucose-Capillary: 156 mg/dL — ABNORMAL HIGH (ref 70–99)
Glucose-Capillary: 94 mg/dL (ref 70–99)

## 2011-12-05 LAB — BASIC METABOLIC PANEL
CO2: 26 mEq/L (ref 19–32)
Calcium: 9.5 mg/dL (ref 8.4–10.5)
GFR calc Af Amer: 22 mL/min — ABNORMAL LOW (ref >90)
Sodium: 154 mEq/L — ABNORMAL HIGH (ref 135–145)

## 2011-12-05 MED ORDER — SODIUM CHLORIDE 0.45 % IV SOLN
INTRAVENOUS | Status: DC
  Administered 2011-12-05 (×2): via INTRAVENOUS
  Administered 2011-12-05: 50 mL via INTRAVENOUS
  Administered 2011-12-06 – 2011-12-07 (×2): via INTRAVENOUS

## 2011-12-05 NOTE — Progress Notes (Signed)
Clinical Social Work Department CLINICAL SOCIAL WORK PLACEMENT NOTE 12/05/2011  Patient:  [name], [name]  Account Number:  [id number] Admit date:  12/02/2011  Clinical Social Worker:  Unk Lightning, LCSW  Date/time:  12/05/2011 04:00 PM  Clinical Social Work is seeking post-discharge placement for this patient at the following level of care:   SKILLED NURSING   (*CSW will update this form in Epic as items are completed)   12/05/2011  Patient/family provided with [facility] Department of Clinical Social Work's list of facilities offering this level of care within the geographic area requested by the patient (or if unable, by the patient's family).  12/05/2011  Patient/family informed of their freedom to choose among providers that offer the needed level of care, that participate in Medicare, Medicaid or managed care program needed by the patient, have an available bed and are willing to accept the patient.  12/05/2011  Patient/family informed of [facility]' ownership interest in [facility], as well as of the fact that they are under no obligation to receive care at this facility.  PASARR submitted to EDS on  PASARR number received from EDS on   FL2 transmitted to all facilities in geographic area requested by pt/family on  12/05/2011 FL2 transmitted to all facilities within larger geographic area on   Patient informed that his/her managed care company has contracts with or will negotiate with  certain facilities, including the following:     Patient/family informed of bed offers received:   Patient chooses bed at  Physician recommends and patient chooses bed at    Patient to be transferred to  on   Patient to be transferred to facility by   The following physician request were entered in Epic:   Additional Comments: 12/05/11-Patient has pasarr that will expire on 12/06/11. Pasarr will be submitted on 12/06/11 once pasarr offically expires.

## 2011-12-05 NOTE — Progress Notes (Signed)
Physical Therapy Treatment Patient Details Name: [name] MRN: [id number] DOB: 04/16/1930 Today's Date: 12/05/2011 Time: 4098-1191 PT Time Calculation (min): 36 min  PT Assessment / Plan / Recommendation Comments on Treatment Session  Admitted with weaknes, falls, diarrhea; Making good progress today in transfers and activity tol    Follow Up Recommendations  Post acute inpatient     Does the patient have the potential to tolerate intense rehabilitation  No, Recommend SNF  Barriers to Discharge        Equipment Recommendations  Rolling walker with 5" wheels;3 in 1 bedside comode;Tub/shower bench    Recommendations for Other Services    Frequency Min 3X/week   Plan Discharge plan remains appropriate    Precautions / Restrictions Precautions Precautions: Fall Restrictions Weight Bearing Restrictions: No   Pertinent Vitals/Pain Still with numbness in feet Less loose stools today per pt    Mobility  Bed Mobility Bed Mobility: Supine to Sit Supine to Sit: 5: Supervision;HOB flat;With rails Details for Bed Mobility Assistance: Pretty smooth transition Transfers Transfers: Sit to Stand;Stand to Sit Sit to Stand: 4: Min assist;From bed;With upper extremity assist Stand to Sit: 4: Min assist;To chair/3-in-1;With upper extremity assist Details for Transfer Assistance: Cues for safety and hand placement, also for optimal positioning of feet closer to center of mass in prep for standing; Cues to control descent with stand to ssit Ambulation/Gait Ambulation/Gait Assistance: 1: +2 Total assist Ambulation/Gait: Patient Percentage: 80% Ambulation Distance (Feet): 15 Feet Assistive device: Rolling walker Ambulation/Gait Assistance Details: Second person present today for safety to push chair behind (will likely not need +2 in further sessions for chair); cues for posture, and encouragement to progress distance Gait Pattern: Shuffle    Exercises     PT Diagnosis:    PT  Problem List:   PT Treatment Interventions:     PT Goals Acute Rehab PT Goals Time For Goal Achievement: 12/17/11 Potential to Achieve Goals: Fair Pt will go Supine/Side to Sit: with modified independence PT Goal: Supine/Side to Sit - Progress: Progressing toward goal Pt will go Sit to Stand: with modified independence PT Goal: Sit to Stand - Progress: Progressing toward goal Pt will go Stand to Sit: with modified independence PT Goal: Stand to Sit - Progress: Progressing toward goal Pt will Ambulate: >150 feet;with modified independence;with rolling walker PT Goal: Ambulate - Progress: Progressing toward goal  Visit Information  Last PT Received On: 12/05/11 Assistance Needed: +1 (+2 helpful for IV, O2 (though not absolutely necessary))    Subjective Data  Subjective: Wanting to walk; became emotional when able to walk to the door Patient Stated Goal: Today pt is agreeable to rehab at SNF   Cognition  Overall Cognitive Status: Appears within functional limits for tasks assessed/performed (for simple tasks) Arousal/Alertness: Awake/alert Orientation Level: Appears intact for tasks assessed Behavior During Session: [facility] for tasks performed Cognition - Other Comments: Somewhat emotionally labile today    Balance     End of Session PT - End of Session Equipment Utilized During Treatment: Gait belt Activity Tolerance: Patient tolerated treatment well Patient left: in chair;with call bell/phone within reach Nurse Communication: Mobility status   GP     Van Clines [facility] [place], [place] [phone]  12/05/2011, 12:14 PM

## 2011-12-05 NOTE — Progress Notes (Signed)
Clinical Social Work  CSW met with patient at bedside to discuss [employer]. Patient reports that after talking with PT that she is agreeable to SNF. CSW provided patient with SNF list and explained process. Patient is familiar with process and reports that she prefers [facility] or [facility] CSW offered to complete a countywide search and then patient could make her decision based on bed offers. Patient is agreeable with this plan. CSW will follow up with bed offers.  [place], [place] [phone] (Coverage for Genelle Bal)
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
## 2011-12-05 NOTE — Discharge Summary (Signed)
Family Medicine Teaching [facility] Discharge Summary  Patient name: [name] Medical record number: [id number] Date of birth: 1930/11/30 Age: 77 y.o. Gender: female Date of Admission: 12/02/2011  Date of Discharge: 12/07/11 Admitting Physician: Barbaraann Barthel, MD  Primary Care Provider: Sanjuana Letters, MD  Indication for Hospitalization: lower extremity weakness and pain Discharge Diagnoses:  Acute on Chronic kidney disease Lower extremity weakness and pain Fall at home Diabetes mellitus, type 2 HTN Atrial fibrillation Hyperlipidemia CAD Systolic Congestive Heart Failure GERD  Brief Hospital Course:  78 y.o. year old female with an extensive PMH (DM-2, HTN, HLD, CAD, Atrial Fibrillation, Diastolic CHF, GERD, recent GI Bleed, CKD) who presents with bilateral lower extremity weakness and pain since discharge from SNF.  Patient also reported fall at home.  1) Acute on Chronic Kidney disease Patient's BUN and creatinine were significantly elevated on admission (115/3.88).  Patient's baseline creatinine is 2.1 - 2.3.  Patient was given IVF with improvement in creatinine back to baseline (see labs below).  2) Lower extremity weakness and pain Patient's pain was treated with PRN Tylenol.  Patient's weakness was addressed by PT and OT during admission.  Patients symptoms improved with treatment and PT/OT services.  Per their recommendations, patient will need continued services and care at a SNF.  3) Fall at home Patient reported recent fall at home.  She stated that she simply fell straight down, and did not hit her head or suffer any injury to her extremities.  On physical exam there were no signs of injury or fracture.  Right and left hip x rays were ordered during admission given fall history and were negative.  4) DM-2  Hemoglobin A1C on admission was 6.0.  Patient was placed on SSI during admission with CBG's TID AC and QHS.  5) HTN Patients home Coreg was  initially held due to low BP and volume depletion.  This was later restarted follow IV fluids.  6) Atrial fibrillation Patient was continued on home Diltiazem.  7) Hyperlipidemia Patient was continued on statin therapy, Atorvastin 10 mg, during admission.  8) CAD Patient was continued on statin therapy.  Aspirin was held given recent history of GI bleed.  Coreg was continued during admission.  9) Systolic CHF Stable during admission.  Patient's home Torsemide was held initially due to volume depletion/Acute on Chronic CKD.  This was later restarted at 20 mg daily prior to discharge.  Further assessment of CHF and titration of Torsemide will be needed at SNF.  10) GERD  Patient was continued on home Protonix 40 mg dialy.  Significant Labs and Imaging:   CBC BMET   Lab 12/06/11 1030 12/04/11 0945 12/02/11 1828  WBC 6.8 5.4 6.8  HGB 8.7* 8.9* 10.1*  HCT 27.8* 27.4* 30.1*  PLT 120* 127* 155    Lab 12/07/11 0903 12/07/11 0610 12/06/11 1030  NA 138 140 143  K 3.7 3.5 3.8  CL 103 106 107  CO2 24 25 25   BUN 36* 38* 43*  CREATININE 1.87* 1.86* 1.99*  GLUCOSE 219* 114* 244*  CALCIUM 9.2 9.3 9.5     Urinalysis    Component Value Date/Time   COLORURINE YELLOW 12/02/2011 1545   APPEARANCEUR TURBID* 12/02/2011 1545   LABSPEC 1.012 12/02/2011 1545   PHURINE 7.0 12/02/2011 1545   GLUCOSEU NEGATIVE 12/02/2011 1545   HGBUR SMALL* 12/02/2011 1545   BILIRUBINUR NEGATIVE 12/02/2011 1545   KETONESUR NEGATIVE 12/02/2011 1545   PROTEINUR 30* 12/02/2011 1545   UROBILINOGEN 0.2 12/02/2011  1545   NITRITE NEGATIVE 12/02/2011 1545   LEUKOCYTESUR LARGE* 12/02/2011 1545   CK - 42  Urine Cx - 45,000 CFU E. Coli  Vitamin D, 25 Hydroxy - 24  Clostridium difficile PCR - negative  Dg Hip Bilateral W/pelvis 12/03/2011   IMPRESSION:  1.  Lower lumbar spondylosis and scoliosis. 2.  Spurring of the pubic symphysis. 3.  No fracture identified.  If pain persist despite conservative therapy, CT  or MRI of the bony pelvis may be helpful to exclude occult bony injury.  Procedures: None  Consultations: None  Discharge Medications:    Medication List     As of 12/07/2011 12:31 PM    STOP taking these medications         hydrochlorothiazide 25 MG tablet   Commonly known as: HYDRODIURIL      potassium chloride SA 20 MEQ tablet   Commonly known as: K-DUR,KLOR-CON      simvastatin 20 MG tablet   Commonly known as: ZOCOR      TAKE these medications         acetaminophen 325 MG tablet   Commonly known as: TYLENOL   Take 2 tablets (650 mg total) by mouth every 6 (six) hours as needed (or Fever >/= 101).      albuterol (5 MG/ML) 0.5% nebulizer solution   Commonly known as: PROVENTIL   Take 0.5 mLs (2.5 mg total) by nebulization every 6 (six) hours as needed for wheezing or shortness of breath.      antiseptic oral rinse Liqd   15 mLs by Mouth Rinse route 2 (two) times daily.      atorvastatin 10 MG tablet   Commonly known as: LIPITOR   Take 1 tablet (10 mg total) by mouth at bedtime.      carvedilol 12.5 MG tablet   Commonly known as: COREG   Take 12.5 mg by mouth 2 (two) times daily with a meal.      diltiazem 120 MG 24 hr capsule   Commonly known as: CARDIZEM CD   Take 1 capsule (120 mg total) by mouth daily.      docusate sodium 100 MG capsule   Commonly known as: COLACE   Take 100 mg by mouth 2 (two) times daily as needed. For constipation      feeding supplement Liqd   Take 237 mLs by mouth 3 (three) times daily with meals.      ferrous sulfate 325 (65 FE) MG tablet   Take 325 mg by mouth daily.      glimepiride 4 MG tablet   Commonly known as: AMARYL   Take 4 mg by mouth daily before breakfast.      multivitamin with minerals Tabs   Take 1 tablet by mouth daily.      NON FORMULARY   Oxygen; 2 L, Dx 428.22 86% on RA      pantoprazole 40 MG tablet   Commonly known as: PROTONIX   Take 1 tablet (40 mg total) by mouth daily at 6 (six) AM.       torsemide 20 MG tablet   Commonly known as: DEMADEX   Take 1 tablet (20 mg total) by mouth daily.         Issues for Follow Up:  1) Recommend BMP to reassess BUN/Creatinine 2) Resolution/improvement in diarrhea  Outstanding Results:  C diff PCR (collected 10/25)  Discharge Instructions: Patient was counseled important signs and symptoms that should prompt return to medical care, changes  in medications, dietary instructions, activity restrictions, and follow up appointments.   Discharge Condition: Stable.  Discharged to SNF [facility].   Everlene Other, DO 12/07/2011, 12:31 PM

## 2011-12-05 NOTE — Progress Notes (Signed)
I interviewed and examined this patient and discussed the care plan with Dr. Adriana Simas and the [facility] team and agree with assessment and plan as documented in the progress note for today.    Athen Riel A. Sheffield Slider, MD Family Medicine Teaching Service Attending  12/05/2011 9:19 PM

## 2011-12-05 NOTE — Progress Notes (Signed)
Occupational Therapy Treatment Patient Details Name: [name] MRN: [id number] DOB: [birth year] Today's Date: 12/05/2011 Time: 7829-5621 OT Time Calculation (min): 26 min  OT Assessment / Plan / Recommendation Comments on Treatment Session Pt making steady progess. Increased independence with Toileting and hygiene today. improved standing endurance. will benefit from rehab at [facility].    Follow Up Recommendations  Skilled nursing facility    Barriers to Discharge       Equipment Recommendations  Rolling walker with 5" wheels;3 in 1 bedside comode;Tub/shower bench    Recommendations for Other Services    Frequency Min 2X/week   Plan Discharge plan remains appropriate    Precautions / Restrictions Precautions Precautions: Fall   Pertinent Vitals/Pain none    ADL  Toilet Transfer: Performed;Moderate assistance Toilet Transfer Method: Sit to stand;Stand pivot Toilet Transfer Equipment: Bedside commode Toileting - Clothing Manipulation and Hygiene: Minimal assistance Where Assessed - Toileting Clothing Manipulation and Hygiene: Sit to stand from 3-in-1 or toilet Equipment Used: Gait belt Transfers/Ambulation Related to ADLs: Mod A sit - stand. forward head. Unsteady. HIgh fall risk. ADL Comments: Will benefit from using    OT Diagnosis:    OT Problem List:   OT Treatment Interventions:     OT Goals Acute Rehab OT Goals OT Goal Formulation: With patient Time For Goal Achievement: 12/17/11 Potential to Achieve Goals: Good ADL Goals Pt Will Perform Grooming: with supervision;Standing at sink ADL Goal: Grooming - Progress: Progressing toward goals Pt Will Perform Lower Body Bathing: with min assist;Sit to stand from chair;Supported;with adaptive equipment;with cueing (comment type and amount) ADL Goal: Lower Body Bathing - Progress: Progressing toward goals Pt Will Perform Lower Body Dressing: with min assist;Supported;with adaptive equipment;with cueing (comment type and  amount);Sit to stand from chair ADL Goal: Lower Body Dressing - Progress: Progressing toward goals Pt Will Transfer to Toilet: with min assist;3-in-1;with DME;Ambulation;with cueing (comment type and amount) ADL Goal: Toilet Transfer - Progress: Progressing toward goals Pt Will Perform Toileting - Clothing Manipulation: with supervision;Standing;with cueing (comment type and amount) ADL Goal: Toileting - Clothing Manipulation - Progress: Progressing toward goals Pt Will Perform Toileting - Hygiene: with supervision;Standing at 3-in-1/toilet;Sit to stand from 3-in-1/toilet;with cueing (comment type and amount) ADL Goal: Toileting - Hygiene - Progress: Progressing toward goals  Visit Information  Last OT Received On: 12/05/11 Assistance Needed: +1    Subjective Data      Prior Functioning       Cognition       Mobility  Shoulder Instructions Bed Mobility Bed Mobility: Supine to Sit Supine to Sit: 5: Supervision Transfers Transfers: Sit to Stand;Stand to Sit Sit to Stand: 3: Mod assist;With upper extremity assist;From bed Stand to Sit: 4: Min assist;Without upper extremity assist;To chair/3-in-1 Details for Transfer Assistance: Cues for safety and hand placement, also for optimal positioning of feet closer to center of mass in prep for standing; Cues to control descent with stand to ssit (improvement today)       Exercises      Balance     End of Session OT - End of Session Equipment Utilized During Treatment: Gait belt Activity Tolerance: Patient tolerated treatment well Patient left: in bed;with call bell/phone within reach Nurse Communication: Mobility status;Precautions  GO     [facility] 12/05/2011, 6:49 PM [facility], OTR/L  [phone] 12/05/2011

## 2011-12-06 LAB — CBC
MCH: 30.3 pg (ref 26.0–34.0)
MCHC: 31.3 g/dL (ref 30.0–36.0)
MCV: 96.9 fL (ref 78.0–100.0)
Platelets: 120 10*3/uL — ABNORMAL LOW (ref 150–400)

## 2011-12-06 LAB — BASIC METABOLIC PANEL
CO2: 25 mEq/L (ref 19–32)
Calcium: 9.5 mg/dL (ref 8.4–10.5)
Creatinine, Ser: 1.99 mg/dL — ABNORMAL HIGH (ref 0.50–1.10)
GFR calc non Af Amer: 23 mL/min — ABNORMAL LOW (ref >90)
Glucose, Bld: 244 mg/dL — ABNORMAL HIGH (ref 70–99)
Sodium: 143 mEq/L (ref 135–145)

## 2011-12-06 LAB — GLUCOSE, CAPILLARY: Glucose-Capillary: 238 mg/dL — ABNORMAL HIGH (ref 70–99)

## 2011-12-06 MED ORDER — CARVEDILOL 12.5 MG PO TABS
12.5000 mg | ORAL_TABLET | Freq: Two times a day (BID) | ORAL | Status: DC
  Administered 2011-12-06 – 2011-12-07 (×2): 12.5 mg via ORAL
  Filled 2011-12-06 (×4): qty 1

## 2011-12-06 MED ORDER — ENSURE COMPLETE PO LIQD
237.0000 mL | Freq: Three times a day (TID) | ORAL | Status: DC
  Administered 2011-12-06 – 2011-12-07 (×2): 237 mL via ORAL

## 2011-12-06 MED ORDER — BIOTENE DRY MOUTH MT LIQD
15.0000 mL | Freq: Two times a day (BID) | OROMUCOSAL | Status: DC
  Administered 2011-12-06 – 2011-12-07 (×2): 15 mL via OROMUCOSAL

## 2011-12-06 NOTE — Progress Notes (Signed)
I interviewed and examined this patient and discussed the care plan with Dr. Adriana Simas and the [facility] team and agree with assessment and plan as documented in the progress note for today.    Marquez Ceesay A. Sheffield Slider, MD Family Medicine Teaching Service Attending  12/06/2011 5:51 PM

## 2011-12-06 NOTE — Progress Notes (Signed)
Family Medicine Teaching Service Daily Progress Note Service Page: [phone]  Subjective: Feeling well. Continues to report frequent diarrhea.  Objective: Temp:  [97.7 F (36.5 C)-97.9 F (36.6 C)] 97.9 F (36.6 C) (10/23 2100) Pulse Rate:  [85-96] 95  (10/23 2100) Resp:  [16-20] 18  (10/23 2100) BP: (123-140)/(73-89) 140/89 mmHg (10/23 2100) SpO2:  [90 %-97 %] 97 % (10/23 2100) Weight:  [167 lb 11.2 oz (76.068 kg)] 167 lb 11.2 oz (76.068 kg) (10/23 1933)  Exam: General: Chronically ill appearing, but NAD  Cardiovascular: Irregularly, irregular. No murmurs appreciated. Respiratory: CTAB.  Abdomen: soft, nontender, nondistended. +BS Extremities: No edema. Reports pain with ROM of lower extremities.  CBC BMET   Lab 12/04/11 0945 12/02/11 1828 12/02/11 1242  WBC 5.4 6.8 8.3  HGB 8.9* 10.1* 10.2*  HCT 27.4* 30.1* 30.3*  PLT 127* 155 161    Lab 12/05/11 0555 12/04/11 1334 12/04/11 0545  NA 154* 144 146*  K 4.2 3.3* 3.0*  CL 115* 101 102  CO2 26 29 28   BUN 63* 79* 88*  CREATININE 2.30* 2.67* 2.83*  GLUCOSE 113* 289* 168*  CALCIUM 9.5 9.3 9.4     Lab Results  Component Value Date   HGBA1C 6.0* 12/02/2011   C diff - Negative.  Lab Results  Component Value Date   TSH 1.115 12/02/2011   Dg Hip Bilateral W/pelvis 12/03/2011 IMPRESSION:  1.  Lower lumbar spondylosis and scoliosis. 2.  Spurring of the pubic symphysis. 3.  No fracture identified.  If pain persist despite conservative therapy, CT or MRI of the bony pelvis may be helpful to exclude occult bony injury.   Assessment/Plan: [name] is a 78 y.o. year old female with an extensive PMH (DM-2, HTN, HLD, CAD, Atrial Fibrillation, Diastolic CHF, GERD, recent GI Bleed, CKD) who presents with bilateral lower extremity weakness and pain since discharge from SNF.  # Bilateral lower extremity weakness and pain and recent fall - likely secondary to deconditioning and lack of care at home. Patient has been bed ridden  since discharge from SNF. Clinically, no signs of DVT and preliminary report of Venous dopplers was negative.  Bilateral hip xrays negative for fracture - Pain control:  Tylenol PRN - PT/OT recommending SNF placement - Will continue PT/OT services during admission  # Acute on Chronic kidney disease - Patient has CKD Stage IV (baseline creatinine of 2.1-2.3). BUN/Creatinine significantly elevated on admission - 115/3.88.  Uremia may be contributing to bilateral lower extremity pain and weakness.  - BUN/creatinine improving with IVF.  - IVF 1/2 NS @ 50 mL/hour  # Asymptomatic bacteruria - Urinalysis positive for large leukocytes and many bacteria - Patient started on Empiric Zosyn to cover for enterococcus given recent SNF stay - Culture results returned today - 45,000 CFU E. Coli.   - Patient was and continues to be asymptomatic.  Antibiotics D/C.  No need for treatment at this time.  # Hypernatremia - Will continue IVF - 1/2 NS @ 50 mL/hr - Awaiting BMP this am. - Will continue to monitor closely.  # Unsafe home situation  - Per patient, she does not feel safe at home. She does not appear to be getting adequate care at home and had a recent fall.  - PT/OT recommending SNF placement - CSW following regarding placement  # DM-2  - A1C 6.0 - CBG's TID AC and QHS  - Sensitive SSI   # Diarrhea - Given patient's recent SNF stay, patient was started on empiric  therapy for C. Diff while awaiting PCR - C. Diff PCR returned negative and Flagyl D/C - Given persistent Diarrhea will recheck C. diff - Will continue to monitor  # Hypokalemia - Resolved - Normal Potassium of 4.2 this am.  # HTN  - Patient's BP starting to increase - 140/89 - Restarting Coreg 12.5 mg BID today.   # A-fib  - Will continue home Diltiazem   # HLD  - Continue home Simvastatin   # GERD  - Will continue home Protonix   # CAD  - No aspirin given recent GI bleed. Continuing statin therapy.   # Diastolic  CHF  - Will monitor closely for signs of volume overload.  - Holding Torsemide due to  Acute on Chronic CKD.  Currently, no signs of volume overload.  FEN/GI: 1/2 NS @ 50 mL/hr Prophylaxis: Heparin SQ  Disposition: pending clinical improvement and placement.  Code Status: Full code currently. Code status needs to be addressed during admission.  Everlene Other, DO 12/06/2011, 7:44 AM

## 2011-12-06 NOTE — Progress Notes (Signed)
Nutrition Follow-up  Intervention:  Encourage supplements as able. Intake appears to be improving.  Assessment:   C diff negative, continues to have diarrhea. Reports that her appetite is improving, ate 100% of breakfast and 50% of lunch.  Ensure Complete was increased to TID per MD.  Sodium trending up, currently 154. Most recent CBG is 238.  Diet Order:  Carbohydrate Modified Medium Supplement: Ensure Complete PO BID  Meds: Scheduled Meds:   . atorvastatin  10 mg Oral QHS  . carvedilol  12.5 mg Oral BID WC  . diltiazem  120 mg Oral Daily  . docusate sodium  100 mg Oral BID  . feeding supplement  237 mL Oral TID WC  . heparin  5,000 Units Subcutaneous Q8H  . insulin aspart  0-9 Units Subcutaneous TID WC  . multivitamin with minerals  1 tablet Oral Daily  . off the beat book   Does not apply Once  . pantoprazole  40 mg Oral Q0600  . DISCONTD: feeding supplement  237 mL Oral BID BM   Continuous Infusions:   . sodium chloride 50 mL/hr at 12/05/11 2243   PRN Meds:.acetaminophen, acetaminophen, ondansetron (ZOFRAN) IV, ondansetron  Labs:  CMP     Component Value Date/Time   NA 154* 12/05/2011 0555   K 4.2 12/05/2011 0555   CL 115* 12/05/2011 0555   CO2 26 12/05/2011 0555   GLUCOSE 113* 12/05/2011 0555   BUN 63* 12/05/2011 0555   CREATININE 2.30* 12/05/2011 0555   CREATININE 1.88* 07/20/2011 1021   CALCIUM 9.5 12/05/2011 0555   PROT 6.9 12/04/2011 0545   ALBUMIN 2.8* 12/04/2011 0545   AST 13 12/04/2011 0545   ALT 11 12/04/2011 0545   ALKPHOS 73 12/04/2011 0545   BILITOT 0.3 12/04/2011 0545   GFRNONAA 19* 12/05/2011 0555   GFRAA 22* 12/05/2011 0555   Sodium  Date/Time Value Range Status  12/05/2011  5:55 AM 154* 135 - 145 mEq/L Final  12/04/2011  1:34 PM 144  135 - 145 mEq/L Final  12/04/2011  5:45 AM 146* 135 - 145 mEq/L Final     DELTA CHECK NOTED    Potassium  Date/Time Value Range Status  12/05/2011  5:55 AM 4.2  3.5 - 5.1 mEq/L Final  12/04/2011  1:34 PM  3.3* 3.5 - 5.1 mEq/L Final  12/04/2011  5:45 AM 3.0* 3.5 - 5.1 mEq/L Final    Phosphorus  Date/Time Value Range Status  11/04/2011  3:40 AM 4.2  2.3 - 4.6 mg/dL Final  1/61/0960  4:54 AM 4.8* 2.3 - 4.6 mg/dL Final    Magnesium  Date/Time Value Range Status  12/04/2011  7:45 PM 1.9  1.5 - 2.5 mg/dL Final  0/98/1191  4:78 AM 2.2  1.5 - 2.5 mg/dL Final  2/95/6213  0:86 PM 2.2  1.5-2.5 mg/dL Final   CBG (last 3)   Basename 12/06/11 1127 12/06/11 0734 12/05/11 2058  GLUCAP 238* 114* 94       Intake/Output Summary (Last 24 hours) at 12/06/11 1357 Last data filed at 12/06/11 1300  Gross per 24 hour  Intake 2284.17 ml  Output    350 ml  Net 1934.17 ml  BM 10/23  Weight Status:  167 lb - wt up 8 lb x 3 days  Estimated needs:  1500 - 1700 kcal, 57 - 67 grams protein  Nutrition Dx:  Inadequate oral intake r/t poor appetite and questionable care at home AEB pt report of poor intake and ongoing weight loss (per chart.)  Goal:  Pt to meet >/= 90% of their estimated nutrition needs  Monitor:  weight trends, lab trends, I/O's, PO intake, supplement tolerance  Jarold Motto MS, RD, LDN Pager: [phone] After-hours pager: [phone]

## 2011-12-06 NOTE — Plan of Care (Signed)
Problem: Problem: Skin/Wound Progression Goal: OTHER SKIN/WOUND GOAL(S) Redness to sacrum due to incontinence.

## 2011-12-07 LAB — BASIC METABOLIC PANEL
BUN: 38 mg/dL — ABNORMAL HIGH (ref 6–23)
BUN: 36 mg/dL — ABNORMAL HIGH (ref 6–23)
CO2: 24 mEq/L (ref 19–32)
Chloride: 106 mEq/L (ref 96–112)
Chloride: 103 mEq/L (ref 96–112)
Creatinine, Ser: 1.86 mg/dL — ABNORMAL HIGH (ref 0.50–1.10)
Creatinine, Ser: 1.87 mg/dL — ABNORMAL HIGH (ref 0.50–1.10)
GFR calc Af Amer: 29 mL/min — ABNORMAL LOW (ref >90)
Glucose, Bld: 114 mg/dL — ABNORMAL HIGH (ref 70–99)
Glucose, Bld: 219 mg/dL — ABNORMAL HIGH (ref 70–99)
Potassium: 3.5 mEq/L (ref 3.5–5.1)
Potassium: 3.7 mEq/L (ref 3.5–5.1)

## 2011-12-07 LAB — CLOSTRIDIUM DIFFICILE BY PCR: Toxigenic C. Difficile by PCR: NEGATIVE

## 2011-12-07 MED ORDER — ATORVASTATIN CALCIUM 10 MG PO TABS: 10.0000 mg | ORAL_TABLET | Freq: Every day | ORAL | Status: DC

## 2011-12-07 MED ORDER — TORSEMIDE 100 MG PO TABS
100.0000 mg | ORAL_TABLET | Freq: Every day | ORAL | Status: DC
  Filled 2011-12-07: qty 1

## 2011-12-07 MED ORDER — ACETAMINOPHEN 325 MG PO TABS: 650.0000 mg | ORAL_TABLET | Freq: Four times a day (QID) | ORAL | Status: DC | PRN

## 2011-12-07 MED ORDER — ALBUTEROL SULFATE (5 MG/ML) 0.5% IN NEBU: 2.5000 mg | INHALATION_SOLUTION | Freq: Four times a day (QID) | RESPIRATORY_TRACT | Status: DC | PRN

## 2011-12-07 MED ORDER — BIOTENE DRY MOUTH MT LIQD: 15.0000 mL | Freq: Two times a day (BID) | OROMUCOSAL | Status: DC

## 2011-12-07 MED ORDER — ADULT MULTIVITAMIN W/MINERALS CH: 1.0000 | ORAL_TABLET | Freq: Every day | ORAL | Status: DC

## 2011-12-07 MED ORDER — ENSURE COMPLETE PO LIQD: 237.0000 mL | Freq: Three times a day (TID) | ORAL | Status: DC

## 2011-12-07 MED ORDER — TORSEMIDE 20 MG PO TABS: 20.0000 mg | ORAL_TABLET | Freq: Every day | ORAL | Status: DC

## 2011-12-07 MED ORDER — TORSEMIDE 20 MG PO TABS
20.0000 mg | ORAL_TABLET | Freq: Every day | ORAL | Status: DC
  Administered 2011-12-07: 20 mg via ORAL
  Filled 2011-12-07: qty 1

## 2011-12-07 NOTE — Progress Notes (Signed)
Clinical Social Work-CSW provided bed offers to pt-pt has chosen [facility]-CSW will notify treatment team and has left message with Sonny Dandy re: d/c and will notify treatment team- CSW will facilitate d/c to [facility] one all d/c paperwork completed- Jodean Lima, [phone]

## 2011-12-07 NOTE — Progress Notes (Signed)
Family Medicine Teaching Service Daily Progress Note Service Page: [phone]  Subjective: Reports some SOB this am. Patient reports that she has had no diarrhea since yesterday.  Objective: Temp:  [98 F (36.7 C)-99 F (37.2 C)] 98.6 F (37 C) (10/25 3664) Pulse Rate:  [85-101] 101  (10/25 0613) Resp:  [17-18] 17  (10/25 0613) BP: (123-135)/(46-106) 123/73 mmHg (10/25 0613) SpO2:  [93 %-98 %] 93 % (10/25 4034) Weight:  [172 lb (78.019 kg)] 172 lb (78.019 kg) (10/24 2151)  Exam: General: Chronically ill appearing, but NAD  Cardiovascular: Irregularly, irregular. No murmurs appreciated. Respiratory: diffuse, mild expiratory wheezing noted. Abdomen: soft, nontender, nondistended. +BS Extremities: No edema.   CBC BMET   Lab 12/06/11 1030 12/04/11 0945 12/02/11 1828  WBC 6.8 5.4 6.8  HGB 8.7* 8.9* 10.1*  HCT 27.8* 27.4* 30.1*  PLT 120* 127* 155    Lab 12/06/11 1030 12/05/11 0555 12/04/11 1334  NA 143 154* 144  K 3.8 4.2 3.3*  CL 107 115* 101  CO2 25 26 29   BUN 43* 63* 79*  CREATININE 1.99* 2.30* 2.67*  GLUCOSE 244* 113* 289*  CALCIUM 9.5 9.5 9.3     Lab Results  Component Value Date   HGBA1C 6.0* 12/02/2011   C diff - Negative. C diff - Pending   Lab Results  Component Value Date   TSH 1.115 12/02/2011   Dg Hip Bilateral W/pelvis 12/03/2011 IMPRESSION:  1.  Lower lumbar spondylosis and scoliosis. 2.  Spurring of the pubic symphysis. 3.  No fracture identified.  If pain persist despite conservative therapy, CT or MRI of the bony pelvis may be helpful to exclude occult bony injury.   Assessment/Plan: [name] is a 78 y.o. year old female with an extensive PMH (DM-2, HTN, HLD, CAD, Atrial Fibrillation, Diastolic CHF, GERD, recent GI Bleed, CKD) who presents with bilateral lower extremity weakness and pain since discharge from SNF.  # Bilateral lower extremity weakness and pain and recent fall - likely secondary to deconditioning and lack of care at home.  Patient has been bed ridden since discharge from SNF. Clinically, no signs of DVT and preliminary report of Venous dopplers was negative.  Bilateral hip xrays negative for fracture - Pain control:  Tylenol PRN - PT/OT recommending SNF placement - Will continue PT/OT services during admission  # Acute on Chronic kidney disease - Patient has CKD Stage IV (baseline creatinine of 2.1-2.3). BUN/Creatinine significantly elevated on admission - 115/3.88.  Uremia may be contributing to bilateral lower extremity pain and weakness.  - BUN/creatinine improved with IVF - Creatinine 1.86 - KVO fluids  # Asymptomatic bacteruria - Urinalysis positive for large leukocytes and many bacteria - Patient started on Empiric Zosyn to cover for enterococcus given recent SNF stay - Culture results returned today - 45,000 CFU E. Coli.   - Patient was and continues to be asymptomatic.  Antibiotics D/C.  No need for treatment at this time.  # CHF - Last Echo (06/2011) revealed EF of 50% - Patient reporting some SOB this am.  Wheezing on exam but no signs of volume overload. - Will restart Torsemide 20 mg daily.  # Hypernatremia - resolved - Will continue to monitor  # Unsafe home situation  - Per patient, she does not feel safe at home. She does not appear to be getting adequate care at home and had a recent fall.  - PT/OT recommending SNF placement - CSW following regarding placement  # DM-2  - A1C 6.0 -  CBG's TID AC and QHS  - Sensitive SSI   # Diarrhea - Given patient's recent SNF stay, patient was started on empiric therapy for C. Diff while awaiting PCR, C. Diff PCR returned negative and Flagyl D/C - Awaiting additional C. Diff PCR as patient has been having persistent diarrhea.  However, diarrhea is currently improving and patient has not had diarrhea since 10/24. - Will continue to monitor  # Hypokalemia - Resolved  # HTN  - Will continue Coreg 12.5 mg BID  # A-fib  - Will continue home Diltiazem    # HLD  - Continue home Simvastatin   # GERD  - Will continue home Protonix   # CAD  - No aspirin given recent GI bleed. Continuing statin therapy.   FEN/GI: KVO Prophylaxis: Heparin SQ  Disposition: pending clinical improvement and placement.  Code Status: Full code currently. Code status needs to be addressed during admission.  Everlene Other, DO 12/07/2011, 7:53 AM

## 2011-12-07 NOTE — Progress Notes (Signed)
Patient was discharged by ambulance to [facility]. Patient was stable upon discharge. Report was called prior to patients departure.

## 2011-12-07 NOTE — Progress Notes (Signed)
Clinical Social Work-CSW arranged [facility] transportation to [facility]-RN provided with number to call report-no further needs at this time- Jodean Lima, [phone]

## 2011-12-08 NOTE — Progress Notes (Signed)
I interviewed and examined this patient and discussed the care plan with Dr. Adriana Simas and the [facility] team and agree with assessment and plan as documented in the progress note.    Nayden Czajka A. Sheffield Slider, MD Family Medicine Teaching Service Attending  12/08/2011 9:31 PM

## 2011-12-08 NOTE — Discharge Summary (Signed)
I interviewed and examined this patient and discussed the care plan with Dr. Adriana Simas and the [facility] team and agree with assessment and plan as documented in the discharge note for today.    [name] A. Sheffield Slider, MD Family Medicine Teaching Service Attending  12/08/2011 5:23 PM

## 2012-01-03 ENCOUNTER — Encounter: Payer: Self-pay | Admitting: Internal Medicine

## 2012-01-03 ENCOUNTER — Ambulatory Visit (INDEPENDENT_AMBULATORY_CARE_PROVIDER_SITE_OTHER): Payer: Medicare Other | Admitting: Internal Medicine

## 2012-01-03 VITALS — BP 138/74 | HR 73 | Ht 62.0 in | Wt 170.0 lb

## 2012-01-03 DIAGNOSIS — I4891 Unspecified atrial fibrillation: Secondary | ICD-10-CM

## 2012-01-03 DIAGNOSIS — I498 Other specified cardiac arrhythmias: Secondary | ICD-10-CM

## 2012-01-03 DIAGNOSIS — R001 Bradycardia, unspecified: Secondary | ICD-10-CM

## 2012-01-03 LAB — PACEMAKER DEVICE OBSERVATION
BATTERY VOLTAGE: 2.78 V
BMOD-0002RV: 10
BRDY-0002RV: 50 {beats}/min
DEVICE MODEL PM: 1687436
RV LEAD AMPLITUDE: 5.5 mv
VENTRICULAR PACING PM: 16

## 2012-01-03 NOTE — Assessment & Plan Note (Signed)
Normal pacemaker function See Pace Art report No changes today  

## 2012-01-03 NOTE — Patient Instructions (Addendum)
Your physician wants you to follow-up in: 1 year with Dr Allred.  You will receive a reminder letter in the mail two months in advance. If you don't receive a letter, please call our office to schedule the follow-up appointment.  

## 2012-01-03 NOTE — Progress Notes (Signed)
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  She had her pacemaker implanted initially in 1997 by Dr Juanda Chance for sick sinus syndrome.  She has developed permanent atrial fibrillation.  Her most recent generator change was 04/27/2005.  Today, she denies symptoms of palpitations, chest pain, shortness of breath (above baseline),  lower extremity edema, dizziness, presyncope, or syncope.  The patient is otherwise without complaint today.   Past Medical History  Diagnosis Date  . Cardiac pacemaker     Dr. Juanda Chance; [employer] Jude VVI  . Macular hole of left eye     told will lose sight  . NICM (nonischemic cardiomyopathy)   . Atrial fibrillation     off anticoagulation presumed to anemia  . Coronary atherosclerosis     and old MI and hx of CVA   . Depression   . Dysphagia   . DM (diabetes mellitus)     nueropathy  . CRI (chronic renal insufficiency)     cr basleine 1.5-1.6  . HTN (hypertension)   . HLD (hyperlipidemia)   . Incontinence   . CHF (congestive heart failure)     related to dyastolic dysfunction  . CAD (coronary artery disease)     nonobstructive  . Osteoarthritis   . Scoliosis   . Repeated falls     hx  . Stasis dermatitis   . Hearing loss   . Anemia   . CHF (congestive heart failure)     cardiacc ath EF 25-30%  . Permanent atrial fibrillation   . Cancer   . Stroke   . Symptomatic bradycardia     sp PPM 1997   Past Surgical History  Procedure Date  . Bliateral foot surgery 08/19/01  . Bladder tack 08/19/01  . Hysterectomy and bso 08/19/01  . Melanoma excision 08/19/01  . Pacemaker placement 09/27/1995, 04/27/2005    SJM pacemaker implanted by Dr Juanda Chance, generator change 04/27/05  . Tonsillectomy   . Appendectomy   . Esophagogastroduodenoscopy 11/02/2011    Procedure: ESOPHAGOGASTRODUODENOSCOPY (EGD);  Surgeon: Hilarie Fredrickson, MD;  Location[facility] ENDOSCOPY;  Service:  Endoscopy;  Laterality: N/A;    Current Outpatient Prescriptions  Medication Sig Dispense Refill  . acetaminophen (TYLENOL) 325 MG tablet Take 2 tablets (650 mg total) by mouth every 6 (six) hours as needed (or Fever >/= 101).      Marland Kitchen albuterol (PROVENTIL) (5 MG/ML) 0.5% nebulizer solution Take 0.5 mLs (2.5 mg total) by nebulization every 6 (six) hours as needed for wheezing or shortness of breath.  20 mL    . antiseptic oral rinse (BIOTENE) LIQD 15 mLs by Mouth Rinse route 2 (two) times daily.      Marland Kitchen atorvastatin (LIPITOR) 10 MG tablet Take 1 tablet (10 mg total) by mouth at bedtime.      Marland Kitchen diltiazem (CARDIZEM CD) 120 MG 24 hr capsule Take 1 capsule (120 mg total) by mouth daily.  30 capsule  3  . docusate sodium (COLACE) 100 MG capsule Take 100 mg by mouth 2 (two) times daily as needed. For constipation      . feeding supplement (ENSURE COMPLETE) LIQD Take 237 mLs by mouth 3 (three) times daily with meals.      . ferrous sulfate 325 (65 FE) MG tablet Take 325 mg by mouth daily.        Marland Kitchen glimepiride (AMARYL) 4 MG tablet Take 4 mg by mouth  daily before breakfast.      . Multiple Vitamin (MULTIVITAMIN WITH MINERALS) TABS Take 1 tablet by mouth daily.      . NON FORMULARY Oxygen; 2 L, Dx 428.22 86% on RA       . pantoprazole (PROTONIX) 40 MG tablet Take 1 tablet (40 mg total) by mouth daily at 6 (six) AM.  30 tablet  3  . torsemide (DEMADEX) 20 MG tablet Take 1 tablet (20 mg total) by mouth daily.        Physical Exam: Filed Vitals:   01/03/12 1114  BP: 138/74  Pulse: 73  Height: 5\' 2"  (1.575 m)  Weight: 170 lb (77.111 kg)  PF: 97 L/min    GEN- The patient is elderly appearing, alert and oriented x 3 today.   Head- normocephalic, atraumatic Eyes-  Sclera clear, conjunctiva pink Ears- hearing intact Oropharynx- clear Lungs- Clear to ausculation bilaterally, normal work of breathing Heart- Regular rate and rhythm (paced) GI- soft, NT, ND, + BS Extremities- no clubbing, cyanosis, +1  edema Pacemaker pocket- well healed  Pacemaker interrogation today is reviewed and normal, see PACEART  Assessment and Plan:

## 2012-01-03 NOTE — Assessment & Plan Note (Signed)
Stable No change required today  She reports poor tolerance of coumadin previously and is clear that she will not consider anticoagulation in the future

## 2012-01-09 ENCOUNTER — Ambulatory Visit: Payer: Medicare Other | Admitting: Family Medicine

## 2012-01-16 ENCOUNTER — Ambulatory Visit: Payer: Medicare Other | Admitting: Family Medicine

## 2012-01-17 ENCOUNTER — Telehealth: Payer: Self-pay | Admitting: Family Medicine

## 2012-01-17 DIAGNOSIS — I5032 Chronic diastolic (congestive) heart failure: Secondary | ICD-10-CM

## 2012-01-17 NOTE — Telephone Encounter (Signed)
Pt is having a hard time coming because they can't get her portable oxygen tank fixed.  She cannot make it here without it.  Is supposed to have a f/u after her d/c from [place].  Wants to know if Dr [name] could call her

## 2012-01-18 ENCOUNTER — Ambulatory Visit: Payer: Medicare Other | Admitting: Family Medicine
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
## 2012-02-01 DIAGNOSIS — I495 Sick sinus syndrome: Secondary | ICD-10-CM

## 2012-02-15 ENCOUNTER — Telehealth: Payer: Self-pay | Admitting: Family Medicine

## 2012-02-15 NOTE — Telephone Encounter (Signed)
Pt is asking to speak to Dr [name] about her medicines - she sounds very confused -

## 2012-02-18 ENCOUNTER — Encounter (HOSPITAL_COMMUNITY): Payer: Self-pay | Admitting: *Deleted

## 2012-02-18 ENCOUNTER — Inpatient Hospital Stay (HOSPITAL_COMMUNITY): Payer: Medicare Other

## 2012-02-18 ENCOUNTER — Emergency Department (HOSPITAL_COMMUNITY): Payer: Medicare Other

## 2012-02-18 ENCOUNTER — Inpatient Hospital Stay (HOSPITAL_COMMUNITY)
Admission: EM | Admit: 2012-02-18 | Discharge: 2012-03-04 | DRG: 871 | Disposition: A | Discharge status: Skilled Nursing Facility | Payer: Medicare Other | Attending: Family Medicine | Admitting: Family Medicine

## 2012-02-18 DIAGNOSIS — F329 Major depressive disorder, single episode, unspecified: Secondary | ICD-10-CM

## 2012-02-18 DIAGNOSIS — I472 Ventricular tachycardia: Secondary | ICD-10-CM

## 2012-02-18 DIAGNOSIS — H9190 Unspecified hearing loss, unspecified ear: Secondary | ICD-10-CM

## 2012-02-18 DIAGNOSIS — Z7901 Long term (current) use of anticoagulants: Secondary | ICD-10-CM

## 2012-02-18 DIAGNOSIS — A0472 Enterocolitis due to Clostridium difficile, not specified as recurrent: Secondary | ICD-10-CM | POA: Diagnosis not present

## 2012-02-18 DIAGNOSIS — I252 Old myocardial infarction: Secondary | ICD-10-CM

## 2012-02-18 DIAGNOSIS — E7800 Pure hypercholesterolemia, unspecified: Secondary | ICD-10-CM

## 2012-02-18 DIAGNOSIS — J189 Pneumonia, unspecified organism: Secondary | ICD-10-CM | POA: Diagnosis present

## 2012-02-18 DIAGNOSIS — A419 Sepsis, unspecified organism: Principal | ICD-10-CM | POA: Diagnosis present

## 2012-02-18 DIAGNOSIS — D689 Coagulation defect, unspecified: Secondary | ICD-10-CM

## 2012-02-18 DIAGNOSIS — K219 Gastro-esophageal reflux disease without esophagitis: Secondary | ICD-10-CM

## 2012-02-18 DIAGNOSIS — R55 Syncope and collapse: Secondary | ICD-10-CM

## 2012-02-18 DIAGNOSIS — N179 Acute kidney failure, unspecified: Secondary | ICD-10-CM | POA: Diagnosis not present

## 2012-02-18 DIAGNOSIS — K921 Melena: Secondary | ICD-10-CM

## 2012-02-18 DIAGNOSIS — R32 Unspecified urinary incontinence: Secondary | ICD-10-CM

## 2012-02-18 DIAGNOSIS — M4120 Other idiopathic scoliosis, site unspecified: Secondary | ICD-10-CM

## 2012-02-18 DIAGNOSIS — M109 Gout, unspecified: Secondary | ICD-10-CM | POA: Diagnosis present

## 2012-02-18 DIAGNOSIS — E1165 Type 2 diabetes mellitus with hyperglycemia: Secondary | ICD-10-CM

## 2012-02-18 DIAGNOSIS — D631 Anemia in chronic kidney disease: Secondary | ICD-10-CM | POA: Diagnosis present

## 2012-02-18 DIAGNOSIS — E1149 Type 2 diabetes mellitus with other diabetic neurological complication: Secondary | ICD-10-CM

## 2012-02-18 DIAGNOSIS — M5137 Other intervertebral disc degeneration, lumbosacral region: Secondary | ICD-10-CM

## 2012-02-18 DIAGNOSIS — Z66 Do not resuscitate: Secondary | ICD-10-CM | POA: Diagnosis present

## 2012-02-18 DIAGNOSIS — Z950 Presence of cardiac pacemaker: Secondary | ICD-10-CM

## 2012-02-18 DIAGNOSIS — R0600 Dyspnea, unspecified: Secondary | ICD-10-CM

## 2012-02-18 DIAGNOSIS — I495 Sick sinus syndrome: Secondary | ICD-10-CM | POA: Diagnosis present

## 2012-02-18 DIAGNOSIS — D649 Anemia, unspecified: Secondary | ICD-10-CM

## 2012-02-18 DIAGNOSIS — R195 Other fecal abnormalities: Secondary | ICD-10-CM

## 2012-02-18 DIAGNOSIS — E1142 Type 2 diabetes mellitus with diabetic polyneuropathy: Secondary | ICD-10-CM | POA: Diagnosis present

## 2012-02-18 DIAGNOSIS — Z79899 Other long term (current) drug therapy: Secondary | ICD-10-CM

## 2012-02-18 DIAGNOSIS — Z515 Encounter for palliative care: Secondary | ICD-10-CM

## 2012-02-18 DIAGNOSIS — J9620 Acute and chronic respiratory failure, unspecified whether with hypoxia or hypercapnia: Secondary | ICD-10-CM | POA: Diagnosis present

## 2012-02-18 DIAGNOSIS — E785 Hyperlipidemia, unspecified: Secondary | ICD-10-CM | POA: Diagnosis present

## 2012-02-18 DIAGNOSIS — J4489 Other specified chronic obstructive pulmonary disease: Secondary | ICD-10-CM | POA: Diagnosis present

## 2012-02-18 DIAGNOSIS — N184 Chronic kidney disease, stage 4 (severe): Secondary | ICD-10-CM

## 2012-02-18 DIAGNOSIS — Z85820 Personal history of malignant melanoma of skin: Secondary | ICD-10-CM

## 2012-02-18 DIAGNOSIS — I4891 Unspecified atrial fibrillation: Secondary | ICD-10-CM

## 2012-02-18 DIAGNOSIS — R509 Fever, unspecified: Secondary | ICD-10-CM

## 2012-02-18 DIAGNOSIS — E876 Hypokalemia: Secondary | ICD-10-CM | POA: Diagnosis not present

## 2012-02-18 DIAGNOSIS — M1990 Unspecified osteoarthritis, unspecified site: Secondary | ICD-10-CM

## 2012-02-18 DIAGNOSIS — K5730 Diverticulosis of large intestine without perforation or abscess without bleeding: Secondary | ICD-10-CM

## 2012-02-18 DIAGNOSIS — R001 Bradycardia, unspecified: Secondary | ICD-10-CM

## 2012-02-18 DIAGNOSIS — D72829 Elevated white blood cell count, unspecified: Secondary | ICD-10-CM

## 2012-02-18 DIAGNOSIS — K259 Gastric ulcer, unspecified as acute or chronic, without hemorrhage or perforation: Secondary | ICD-10-CM

## 2012-02-18 DIAGNOSIS — I498 Other specified cardiac arrhythmias: Secondary | ICD-10-CM

## 2012-02-18 DIAGNOSIS — R0601 Orthopnea: Secondary | ICD-10-CM

## 2012-02-18 DIAGNOSIS — J690 Pneumonitis due to inhalation of food and vomit: Secondary | ICD-10-CM | POA: Diagnosis present

## 2012-02-18 DIAGNOSIS — I8310 Varicose veins of unspecified lower extremity with inflammation: Secondary | ICD-10-CM

## 2012-02-18 DIAGNOSIS — N390 Urinary tract infection, site not specified: Secondary | ICD-10-CM

## 2012-02-18 DIAGNOSIS — I2789 Other specified pulmonary heart diseases: Secondary | ICD-10-CM | POA: Diagnosis present

## 2012-02-18 DIAGNOSIS — I6789 Other cerebrovascular disease: Secondary | ICD-10-CM

## 2012-02-18 DIAGNOSIS — Z7982 Long term (current) use of aspirin: Secondary | ICD-10-CM

## 2012-02-18 DIAGNOSIS — I5033 Acute on chronic diastolic (congestive) heart failure: Secondary | ICD-10-CM

## 2012-02-18 DIAGNOSIS — I509 Heart failure, unspecified: Secondary | ICD-10-CM

## 2012-02-18 DIAGNOSIS — D62 Acute posthemorrhagic anemia: Secondary | ICD-10-CM

## 2012-02-18 DIAGNOSIS — R7989 Other specified abnormal findings of blood chemistry: Secondary | ICD-10-CM

## 2012-02-18 DIAGNOSIS — H3530 Unspecified macular degeneration: Secondary | ICD-10-CM

## 2012-02-18 DIAGNOSIS — IMO0002 Reserved for concepts with insufficient information to code with codable children: Secondary | ICD-10-CM

## 2012-02-18 DIAGNOSIS — I2510 Atherosclerotic heart disease of native coronary artery without angina pectoris: Secondary | ICD-10-CM

## 2012-02-18 DIAGNOSIS — I5032 Chronic diastolic (congestive) heart failure: Secondary | ICD-10-CM

## 2012-02-18 DIAGNOSIS — F3289 Other specified depressive episodes: Secondary | ICD-10-CM

## 2012-02-18 DIAGNOSIS — I10 Essential (primary) hypertension: Secondary | ICD-10-CM

## 2012-02-18 DIAGNOSIS — D6489 Other specified anemias: Secondary | ICD-10-CM

## 2012-02-18 DIAGNOSIS — R252 Cramp and spasm: Secondary | ICD-10-CM

## 2012-02-18 DIAGNOSIS — M542 Cervicalgia: Secondary | ICD-10-CM

## 2012-02-18 DIAGNOSIS — R000 Tachycardia, unspecified: Secondary | ICD-10-CM

## 2012-02-18 DIAGNOSIS — M51379 Other intervertebral disc degeneration, lumbosacral region without mention of lumbar back pain or lower extremity pain: Secondary | ICD-10-CM

## 2012-02-18 DIAGNOSIS — I428 Other cardiomyopathies: Secondary | ICD-10-CM | POA: Diagnosis present

## 2012-02-18 DIAGNOSIS — I129 Hypertensive chronic kidney disease with stage 1 through stage 4 chronic kidney disease, or unspecified chronic kidney disease: Secondary | ICD-10-CM | POA: Diagnosis present

## 2012-02-18 DIAGNOSIS — J449 Chronic obstructive pulmonary disease, unspecified: Secondary | ICD-10-CM | POA: Diagnosis present

## 2012-02-18 DIAGNOSIS — Z8673 Personal history of transient ischemic attack (TIA), and cerebral infarction without residual deficits: Secondary | ICD-10-CM

## 2012-02-18 DIAGNOSIS — E781 Pure hyperglyceridemia: Secondary | ICD-10-CM

## 2012-02-18 LAB — URINALYSIS, ROUTINE W REFLEX MICROSCOPIC
Bilirubin Urine: NEGATIVE
Glucose, UA: NEGATIVE mg/dL
Hgb urine dipstick: NEGATIVE
Ketones, ur: NEGATIVE mg/dL
Protein, ur: 100 mg/dL — AB
pH: 7.5 (ref 5.0–8.0)

## 2012-02-18 LAB — CBC WITH DIFFERENTIAL/PLATELET
Basophils Absolute: 0 10*3/uL (ref 0.0–0.1)
Basophils Relative: 0 % (ref 0–1)
HCT: 28.8 % — ABNORMAL LOW (ref 36.0–46.0)
Lymphocytes Relative: 12 % (ref 12–46)
MCHC: 29.2 g/dL — ABNORMAL LOW (ref 30.0–36.0)
Monocytes Absolute: 0.9 10*3/uL (ref 0.1–1.0)
Neutro Abs: 12 10*3/uL — ABNORMAL HIGH (ref 1.7–7.7)
Neutrophils Relative %: 81 % — ABNORMAL HIGH (ref 43–77)
RDW: 15.9 % — ABNORMAL HIGH (ref 11.5–15.5)
WBC: 14.8 10*3/uL — ABNORMAL HIGH (ref 4.0–10.5)

## 2012-02-18 LAB — COMPREHENSIVE METABOLIC PANEL
ALT: 11 U/L (ref 0–35)
AST: 16 U/L (ref 0–37)
Albumin: 2.9 g/dL — ABNORMAL LOW (ref 3.5–5.2)
Alkaline Phosphatase: 111 U/L (ref 39–117)
CO2: 23 mEq/L (ref 19–32)
Chloride: 95 mEq/L — ABNORMAL LOW (ref 96–112)
Creatinine, Ser: 1.74 mg/dL — ABNORMAL HIGH (ref 0.50–1.10)
GFR calc non Af Amer: 27 mL/min — ABNORMAL LOW (ref >90)
Potassium: 4 mEq/L (ref 3.5–5.1)
Sodium: 139 mEq/L (ref 135–145)
Total Bilirubin: 0.6 mg/dL (ref 0.3–1.2)

## 2012-02-18 LAB — POCT I-STAT TROPONIN I

## 2012-02-18 LAB — POCT I-STAT 3, ART BLOOD GAS (G3+)
Bicarbonate: 23.6 mEq/L (ref 20.0–24.0)
TCO2: 25 mmol/L (ref 0–100)
pCO2 arterial: 35.1 mmHg (ref 35.0–45.0)
pH, Arterial: 7.436 (ref 7.350–7.450)

## 2012-02-18 LAB — URINE MICROSCOPIC-ADD ON

## 2012-02-18 LAB — GLUCOSE, CAPILLARY

## 2012-02-18 LAB — TROPONIN I: Troponin I: <0.3 ng/mL (ref <0.30)

## 2012-02-18 MED ORDER — PIPERACILLIN-TAZOBACTAM 3.375 G IVPB 30 MIN
3.3750 g | Freq: Once | INTRAVENOUS | Status: AC
  Administered 2012-02-18: 3.375 g via INTRAVENOUS
  Filled 2012-02-18: qty 50

## 2012-02-18 MED ORDER — FUROSEMIDE 10 MG/ML IJ SOLN
40.0000 mg | Freq: Once | INTRAMUSCULAR | Status: AC
  Administered 2012-02-18: 40 mg via INTRAVENOUS
  Filled 2012-02-18: qty 4

## 2012-02-18 MED ORDER — PANTOPRAZOLE SODIUM 40 MG PO TBEC
40.0000 mg | DELAYED_RELEASE_TABLET | Freq: Every day | ORAL | Status: DC
  Administered 2012-02-19 – 2012-03-04 (×15): 40 mg via ORAL
  Filled 2012-02-18 (×15): qty 1

## 2012-02-18 MED ORDER — ONDANSETRON HCL 4 MG/2ML IJ SOLN
INTRAMUSCULAR | Status: AC
  Administered 2012-02-18: 4 mg via INTRAVENOUS
  Filled 2012-02-18: qty 2

## 2012-02-18 MED ORDER — SODIUM CHLORIDE 0.9 % IV SOLN: INTRAVENOUS | Status: DC

## 2012-02-18 MED ORDER — AMIODARONE IV BOLUS ONLY 150 MG/100ML
150.0000 mg | Freq: Once | INTRAVENOUS | Status: AC
  Administered 2012-02-18: 150 mg via INTRAVENOUS
  Filled 2012-02-18: qty 100

## 2012-02-18 MED ORDER — ENOXAPARIN SODIUM 30 MG/0.3ML ~~LOC~~ SOLN
30.0000 mg | SUBCUTANEOUS | Status: DC
  Administered 2012-02-18 – 2012-02-20 (×3): 30 mg via SUBCUTANEOUS
  Filled 2012-02-18 (×4): qty 0.3

## 2012-02-18 MED ORDER — ACETAMINOPHEN 325 MG PO TABS
650.0000 mg | ORAL_TABLET | Freq: Four times a day (QID) | ORAL | Status: DC | PRN
  Administered 2012-02-21 – 2012-02-27 (×3): 650 mg via ORAL
  Filled 2012-02-18 (×3): qty 2

## 2012-02-18 MED ORDER — ACETAMINOPHEN 325 MG PO TABS
650.0000 mg | ORAL_TABLET | Freq: Four times a day (QID) | ORAL | Status: DC | PRN
  Administered 2012-02-20: 650 mg via ORAL
  Filled 2012-02-18: qty 2

## 2012-02-18 MED ORDER — ONDANSETRON HCL 4 MG/2ML IJ SOLN
4.0000 mg | Freq: Four times a day (QID) | INTRAMUSCULAR | Status: DC | PRN
  Administered 2012-02-21: 4 mg via INTRAVENOUS

## 2012-02-18 MED ORDER — FUROSEMIDE 10 MG/ML IJ SOLN
80.0000 mg | Freq: Two times a day (BID) | INTRAMUSCULAR | Status: DC
  Administered 2012-02-18 – 2012-02-19 (×2): 80 mg via INTRAMUSCULAR
  Filled 2012-02-18 (×4): qty 8

## 2012-02-18 MED ORDER — CARVEDILOL 12.5 MG PO TABS
12.5000 mg | ORAL_TABLET | Freq: Two times a day (BID) | ORAL | Status: DC
  Administered 2012-02-19 – 2012-03-04 (×29): 12.5 mg via ORAL
  Filled 2012-02-18 (×31): qty 1

## 2012-02-18 MED ORDER — DILTIAZEM HCL 50 MG/10ML IV SOLN
20.0000 mg | Freq: Once | INTRAVENOUS | Status: AC
  Administered 2012-02-18: 20 mg via INTRAVENOUS

## 2012-02-18 MED ORDER — ASPIRIN 325 MG PO TABS
325.0000 mg | ORAL_TABLET | Freq: Once | ORAL | Status: AC
  Administered 2012-02-18: 325 mg via ORAL
  Filled 2012-02-18: qty 1

## 2012-02-18 MED ORDER — DOCUSATE SODIUM 100 MG PO CAPS
100.0000 mg | ORAL_CAPSULE | Freq: Two times a day (BID) | ORAL | Status: DC | PRN
  Filled 2012-02-18: qty 1

## 2012-02-18 MED ORDER — VANCOMYCIN HCL 10 G IV SOLR
1500.0000 mg | Freq: Once | INTRAVENOUS | Status: AC
  Administered 2012-02-18: 1500 mg via INTRAVENOUS
  Filled 2012-02-18: qty 1500

## 2012-02-18 MED ORDER — ONDANSETRON HCL 4 MG/2ML IJ SOLN
4.0000 mg | Freq: Once | INTRAMUSCULAR | Status: AC
  Administered 2012-02-18: 4 mg via INTRAVENOUS

## 2012-02-18 MED ORDER — FERROUS SULFATE 325 (65 FE) MG PO TABS
325.0000 mg | ORAL_TABLET | Freq: Two times a day (BID) | ORAL | Status: DC
  Administered 2012-02-18 – 2012-03-04 (×29): 325 mg via ORAL
  Filled 2012-02-18 (×32): qty 1

## 2012-02-18 MED ORDER — INSULIN ASPART 100 UNIT/ML ~~LOC~~ SOLN
0.0000 [IU] | Freq: Every day | SUBCUTANEOUS | Status: DC
  Administered 2012-02-21 – 2012-03-03 (×4): 2 [IU] via SUBCUTANEOUS

## 2012-02-18 MED ORDER — ACETAMINOPHEN 650 MG RE SUPP: 650.0000 mg | Freq: Four times a day (QID) | RECTAL | Status: DC | PRN

## 2012-02-18 MED ORDER — DILTIAZEM HCL 100 MG IV SOLR
5.0000 mg/h | INTRAVENOUS | Status: AC
  Administered 2012-02-18 – 2012-02-19 (×2): 5 mg/h via INTRAVENOUS
  Filled 2012-02-18: qty 100

## 2012-02-18 MED ORDER — BIOTENE DRY MOUTH MT LIQD
15.0000 mL | Freq: Two times a day (BID) | OROMUCOSAL | Status: DC
  Administered 2012-02-18 – 2012-03-03 (×28): 15 mL via OROMUCOSAL

## 2012-02-18 MED ORDER — SODIUM CHLORIDE 0.9 % IJ SOLN
3.0000 mL | Freq: Two times a day (BID) | INTRAMUSCULAR | Status: DC
  Administered 2012-02-18 – 2012-02-20 (×4): 3 mL via INTRAVENOUS
  Administered 2012-02-21: 6 mL via INTRAVENOUS
  Administered 2012-02-21: 09:00:00 via INTRAVENOUS
  Administered 2012-02-22 – 2012-02-28 (×9): 3 mL via INTRAVENOUS

## 2012-02-18 MED ORDER — ADULT MULTIVITAMIN W/MINERALS CH
1.0000 | ORAL_TABLET | Freq: Every day | ORAL | Status: DC
  Administered 2012-02-18 – 2012-03-04 (×15): 1 via ORAL
  Filled 2012-02-18 (×16): qty 1

## 2012-02-18 MED ORDER — ATORVASTATIN CALCIUM 10 MG PO TABS
10.0000 mg | ORAL_TABLET | Freq: Every day | ORAL | Status: DC
  Administered 2012-02-18 – 2012-03-04 (×15): 10 mg via ORAL
  Filled 2012-02-18 (×16): qty 1

## 2012-02-18 MED ORDER — ENSURE COMPLETE PO LIQD
237.0000 mL | Freq: Three times a day (TID) | ORAL | Status: DC
  Administered 2012-02-19 – 2012-02-25 (×15): 237 mL via ORAL

## 2012-02-18 MED ORDER — INSULIN ASPART 100 UNIT/ML ~~LOC~~ SOLN
0.0000 [IU] | Freq: Three times a day (TID) | SUBCUTANEOUS | Status: DC
  Administered 2012-02-19: 3 [IU] via SUBCUTANEOUS
  Administered 2012-02-19: 1 [IU] via SUBCUTANEOUS
  Administered 2012-02-20 – 2012-02-21 (×3): 2 [IU] via SUBCUTANEOUS
  Administered 2012-02-22 (×2): 3 [IU] via SUBCUTANEOUS
  Administered 2012-02-22: 1 [IU] via SUBCUTANEOUS
  Administered 2012-02-23: 2 [IU] via SUBCUTANEOUS
  Administered 2012-02-23: 1 [IU] via SUBCUTANEOUS
  Administered 2012-02-23: 2 [IU] via SUBCUTANEOUS
  Administered 2012-02-24 (×2): 3 [IU] via SUBCUTANEOUS
  Administered 2012-02-25: 1 [IU] via SUBCUTANEOUS
  Administered 2012-02-25: 3 [IU] via SUBCUTANEOUS
  Administered 2012-02-25: 7 [IU] via SUBCUTANEOUS
  Administered 2012-02-26: 2 [IU] via SUBCUTANEOUS
  Administered 2012-02-26: 1 [IU] via SUBCUTANEOUS
  Administered 2012-02-26: 3 [IU] via SUBCUTANEOUS
  Administered 2012-02-27: 1 [IU] via SUBCUTANEOUS
  Administered 2012-02-29: 15:00:00 via SUBCUTANEOUS
  Administered 2012-03-01: 1 [IU] via SUBCUTANEOUS
  Administered 2012-03-01: 2 [IU] via SUBCUTANEOUS
  Administered 2012-03-02 (×3): 1 [IU] via SUBCUTANEOUS
  Administered 2012-03-03: 2 [IU] via SUBCUTANEOUS
  Administered 2012-03-03 – 2012-03-04 (×2): 1 [IU] via SUBCUTANEOUS

## 2012-02-18 NOTE — ED Provider Notes (Signed)
History     CSN: [id number]  Arrival date & time 02/18/12  1619   None     Chief Complaint  Patient presents with  . Respiratory Distress  . Congestive Heart Failure  . COPD    HPI chief complaint: Shortness of breath. Onset: 2 hours ago. Location: At home. Severity moderate. Timing: Constant. Context: Sudden onset. For signs and symptoms to review of systems. Regarding social history see nurse's notes. Unable to obtain family history at this time. I have reviewed patient's past medical, past surgical, social history as well as medications and allergies.  Past Medical History  Diagnosis Date  . Cardiac pacemaker     Dr. Juanda Chance; [employer]. Jude VVI  . Macular hole of left eye     told will lose sight  . NICM (nonischemic cardiomyopathy)   . Atrial fibrillation     off anticoagulation presumed to anemia  . Coronary atherosclerosis     and old MI and hx of CVA   . Depression   . Dysphagia   . DM (diabetes mellitus)     nueropathy  . CRI (chronic renal insufficiency)     cr basleine 1.5-1.6  . HTN (hypertension)   . HLD (hyperlipidemia)   . Incontinence   . CHF (congestive heart failure)     related to dyastolic dysfunction  . CAD (coronary artery disease)     nonobstructive  . Osteoarthritis   . Scoliosis   . Repeated falls     hx  . Stasis dermatitis   . Hearing loss   . Anemia   . CHF (congestive heart failure)     cardiacc ath EF 25-30%  . Permanent atrial fibrillation   . Cancer   . Stroke   . Symptomatic bradycardia     sp PPM 1997    Past Surgical History  Procedure Date  . Bliateral foot surgery 08/19/01  . Bladder tack 08/19/01  . Hysterectomy and bso 08/19/01  . Melanoma excision 08/19/01  . Pacemaker placement 09/27/1995, 04/27/2005    SJM pacemaker implanted by Dr Juanda Chance, generator change 04/27/05  . Tonsillectomy   . Appendectomy   . Esophagogastroduodenoscopy 11/02/2011    Procedure: ESOPHAGOGASTRODUODENOSCOPY (EGD);  Surgeon: Hilarie Fredrickson, MD;  Location: [facility]  ENDOSCOPY;  Service: Endoscopy;  Laterality: N/A;    Family History  Problem Relation Age of Onset  . Heart attack      family hx  . Colon cancer      family hx  . Stroke      family hx  . Diabetes      DM - family hx     History  Substance Use Topics  . Smoking status: Never Smoker   . Smokeless tobacco: Not on file     Comment: non smoker   . Alcohol Use: No    OB History    Grav Para Term Preterm Abortions TAB SAB Ect Mult Living                  Review of Systems  Constitutional: Negative for fever and chills.  HENT: Negative for trouble swallowing, neck pain and neck stiffness.   Eyes: Negative for pain, discharge and itching.  Respiratory: Positive for cough and shortness of breath. Negative for chest tightness.   Cardiovascular: Positive for chest pain. Negative for palpitations and leg swelling.  Gastrointestinal: Negative for nausea, vomiting, abdominal pain, diarrhea, constipation and blood in stool.  Genitourinary: Negative for dysuria, urgency, frequency, hematuria, flank  pain, decreased urine volume, difficulty urinating and pelvic pain.  Musculoskeletal: Negative for back pain and joint swelling.  Skin: Negative for rash and wound.  Neurological: Negative for dizziness, tremors, seizures, syncope, facial asymmetry, speech difficulty, weakness, light-headedness, numbness and headaches.  Hematological: Negative for adenopathy. Does not bruise/bleed easily.  Psychiatric/Behavioral: Negative for confusion and decreased concentration.    Allergies  Paroxetine; Codeine phosphate; Hydrocodone; and Tramadol hcl  Home Medications   Current Outpatient Rx  Name  Route  Sig  Dispense  Refill  . ACETAMINOPHEN 325 MG PO TABS   Oral   Take 2 tablets (650 mg total) by mouth every 6 (six) hours as needed (or Fever >/= 101).         . ALBUTEROL SULFATE (5 MG/ML) 0.5% IN NEBU   Nebulization   Take 0.5 mLs (2.5 mg total) by nebulization every 6 (six) hours as  needed for wheezing or shortness of breath.   20 mL      . BIOTENE DRY MOUTH MT LIQD   Mouth Rinse   15 mLs by Mouth Rinse route 2 (two) times daily.         . ATORVASTATIN CALCIUM 10 MG PO TABS   Oral   Take 1 tablet (10 mg total) by mouth at bedtime.         Marland Kitchen DILTIAZEM HCL ER COATED BEADS 120 MG PO CP24   Oral   Take 1 capsule (120 mg total) by mouth daily.   30 capsule   3   . DOCUSATE SODIUM 100 MG PO CAPS   Oral   Take 100 mg by mouth 2 (two) times daily as needed. For constipation         . ENSURE COMPLETE PO LIQD   Oral   Take 237 mLs by mouth 3 (three) times daily with meals.         Di Kindle SULFATE 325 (65 FE) MG PO TABS   Oral   Take 325 mg by mouth daily.           Marland Kitchen GLIMEPIRIDE 4 MG PO TABS   Oral   Take 4 mg by mouth daily before breakfast.         . ADULT MULTIVITAMIN W/MINERALS CH   Oral   Take 1 tablet by mouth daily.         . NON FORMULARY      Oxygen; 2 L, Dx 428.22 86% on RA          . PANTOPRAZOLE SODIUM 40 MG PO TBEC   Oral   Take 1 tablet (40 mg total) by mouth daily at 6 (six) AM.   30 tablet   3   . TORSEMIDE 20 MG PO TABS   Oral   Take 1 tablet (20 mg total) by mouth daily.           There were no vitals taken for this visit.  Physical Exam  Constitutional: She is oriented to person, place, and time. She appears well-developed and well-nourished. No distress.  HENT:  Head: Normocephalic and atraumatic.  Eyes: Conjunctivae normal are normal. Right eye exhibits no discharge. Left eye exhibits no discharge. No scleral icterus.  Neck: Normal range of motion. Neck supple. JVD present.  Cardiovascular: Normal rate, regular rhythm, normal heart sounds and intact distal pulses.   No murmur heard. Pulmonary/Chest: Tachypnea noted. She is in respiratory distress. She has no decreased breath sounds. She has no wheezes. She has no rhonchi. She  has rales in the right lower field and the left lower field. She exhibits no  tenderness.  Abdominal: Soft. Bowel sounds are normal. She exhibits no distension. There is no tenderness.  Musculoskeletal: Normal range of motion. She exhibits edema. She exhibits no tenderness.  Neurological: She is alert and oriented to person, place, and time.  Skin: Skin is warm and dry. She is not diaphoretic.  Psychiatric: She has a normal mood and affect.    ED Course  Procedures (including critical care time)  Labs Reviewed  CBC WITH DIFFERENTIAL - Abnormal; Notable for the following:    WBC 14.8 (*)     RBC 3.09 (*)     Hemoglobin 8.4 (*)     HCT 28.8 (*)     MCHC 29.2 (*)     RDW 15.9 (*)     Neutrophils Relative 81 (*)     Neutro Abs 12.0 (*)     All other components within normal limits  COMPREHENSIVE METABOLIC PANEL - Abnormal; Notable for the following:    Chloride 95 (*)     Glucose, Bld 317 (*)     BUN 30 (*)     Creatinine, Ser 1.74 (*)     Albumin 2.9 (*)     GFR calc non Af Amer 27 (*)     GFR calc Af Amer 31 (*)     All other components within normal limits  PRO B NATRIURETIC PEPTIDE - Abnormal; Notable for the following:    Pro B Natriuretic peptide (BNP) 34797.0 (*)     All other components within normal limits  URINALYSIS, ROUTINE W REFLEX MICROSCOPIC - Abnormal; Notable for the following:    APPearance CLOUDY (*)     Protein, ur 100 (*)     Leukocytes, UA TRACE (*)     All other components within normal limits  GLUCOSE, CAPILLARY - Abnormal; Notable for the following:    Glucose-Capillary 283 (*)     All other components within normal limits  POCT I-STAT 3, BLOOD GAS (G3+) - Abnormal; Notable for the following:    pO2, Arterial 130.0 (*)     All other components within normal limits  URINE MICROSCOPIC-ADD ON - Abnormal; Notable for the following:    Squamous Epithelial / LPF FEW (*)     Bacteria, UA MANY (*)     Casts HYALINE CASTS (*)     All other components within normal limits  GLUCOSE, CAPILLARY - Abnormal; Notable for the following:      Glucose-Capillary 147 (*)     All other components within normal limits  POCT I-STAT TROPONIN I  TROPONIN I  MRSA PCR SCREENING  BLOOD GAS, ARTERIAL  URINE CULTURE  BASIC METABOLIC PANEL  CBC  TROPONIN I  TROPONIN I  VITAMIN B12  FOLATE  IRON AND TIBC  FERRITIN  RETICULOCYTES  LIPID PANEL   Portable Chest 2 Views  02/18/2012  *RADIOLOGY REPORT*  Clinical Data: Difficulty breathing.  PORTABLE CHEST - 2 VIEW  Comparison: 02/17/2022 obtained 4:43 p.m.  Findings: Worsening aeration left lung base.  This may represent atelectasis, infiltrate and / or pleural effusion.  Cardiomegaly with sequential pacemaker in place.  Pulmonary vascular prominence most notable centrally.  Small right-sided pleural effusion.  IMPRESSION: Interval worsening in the degree of aeration left lung base may represent pleural fluid with atelectasis and / or infiltrate. Recommend follow-up until clearance.  Cardiomegaly with central pulmonary vascular prominence and small right-sided pleural effusion raising possibility of  mild congestive heart failure.  Calcified tortuous aorta.   Original Report Authenticated By: Lacy Duverney, M.D.    Dg Chest Portable 1 View  02/18/2012  *RADIOLOGY REPORT*  Clinical Data: Respiratory distress.  Congestive heart failure. COPD.  PORTABLE CHEST - 1 VIEW  Comparison: 02/22/2010.  Findings: Sequential pacemaker enters from the right.  Leads unchanged in position.  Cardiomegaly.  Pulmonary vascular congestion superimposed on chronic changes.  Retrocardiac opacity may represent crowding of vessels or atelectasis however, infiltrate not excluded.  No gross pneumothorax.  Calcified mildly tortuous aorta.  The patient would eventually benefit from follow-up two-view chest with cardiac leads removed.  IMPRESSION: Cardiomegaly.  Pulmonary vascular congestion superimposed on chronic changes.  Retrocardiac opacity may represent crowding of vessels or atelectasis however, infiltrate not excluded.    Original Report Authenticated By: Lacy Duverney, M.D.      1. Acute exacerbation of CHF (congestive heart failure)   2. Dyspnea   3. Elevated brain natriuretic peptide (BNP) level   4. Fever   5. Leukocytosis, unspecified   6. Chronic anemia   7. Atrial fibrillation with rapid ventricular response      EKG reviewed and interpreted. Wide-complex tachycardia with a intermittent left bundle branch block rate 155. T-wave depressions in V5 and V6. No ST segment abnormalities. MDM  Patient is an elderly female past medical history of congestive heart failure COPD atrial fibrillation and was in her normal state of health this morning when she suddenly developed acute onset shortness of breath with reported pink frothy sputum at home. Her clinical picture most consistent with a congestive heart failure exacerbation with flash pulmonary edema. Dyspnea improved dramatically with BiPAP and patient was converted over to non-rebreather. The patient does have a new fever and given we cannot exclude pneumonia at this time vancomycin and Zosyn were ordered to cover for possible hospital acquired pneumonia given her last admission was less than 3 months ago. The patient's tachycardia is consistent with atrial fibrillation with RVR with an intermittent wide-complex tachycardia. Rate controlled with Cardizem. Blood pressures remained stable. Clinical picture doubtful for acute myocardial infarction, pulmonary embolus, aortic dissection, pneumothorax. Admitted to family medicine to a step down unit. Lasix given in the emergency department.        Consuello Masse, MD 02/19/12 [phone]

## 2012-02-18 NOTE — ED Notes (Signed)
Placed temp foley int patient size 14 french. Cloudy urine return

## 2012-02-18 NOTE — ED Notes (Signed)
ST Jude rep called will check device.

## 2012-02-18 NOTE — ED Provider Notes (Signed)
I saw and evaluated the patient, reviewed the resident's note and I agree with the findings and plan.  Pt with chf and on bipap--sx started suddenly pta--awaiting labs and xrays  Toy Baker, MD 02/18/12 1635

## 2012-02-18 NOTE — Progress Notes (Deleted)
Family Medicine Teaching Service Hospital Admission History and Physical Service Pager: [phone]  Patient name: [name] Medical record number: [id number] Date of birth: 10/17/1930 Age: 79 y.o. Gender: female  Primary Care Provider: HENSEL,WILLIAM ARTHUR, MD  Chief Complaint: Shortness of breath  History of Present Illness: [name] is a 79 y.o. year old female presenting via EMS after she "got strangled"on some milk today at noon. She states she was at home with her daughter when she choked on some milk and couldn't catch her breath. She continued to feel short of breath and so they called EMS. She denies a recent history of aspiration other than this event. She also states that for the last three weeks she's had gradually worsening leg swelling, but insists that she was not dyspneic until she choked on her milk today. She also notes chest pain that coincides with her dyspnea that started after the event. She denies wheezing, prior cough, congestion, abdominal pain, previous chest pain, or previous palpations, and dysuria. Additionally she's been nauseous since noon but no emesis. She uses 2 L of O2 via [place] at home.   Since arriving in the Ed she has  Had 150 mg of amiodarone, been started on a diltiazem drip due to wide complex tachycardia at arrival, and zosyn for aspiration. She was also supported with a BIPAP initially but has since become stable on a non-re breather. She has improved some in the ED, and reports feeling better.  Patient Active Problem List  Diagnosis  . DIABETIC PERIPHERAL NEUROPATHY  . DIABETES MELLITUS, II, COMPLICATIONS  . HYPERCHOLESTEROLEMIA  . HYPERTRIGLYCERIDEMIA  . ANEMIA NEC  . DEPRESSIVE DISORDER, NOS  . MACULAR DEGENERATION, SENILE  . UNSPECIFIED HEARING LOSS  . HYPERTENSION, BENIGN SYSTEMIC  . MYOCARDIAL INFARCTION, OLD  . CORONARY, ARTERIOSCLEROSIS  . HOCM / IHSS  . ATRIAL FIBRILLATION  . CVA  . STASIS DERMATITIS  . DIVERTICULOSIS, COLON  .  OSTEOARTHRITIS, MULTI SITES  . DEGENERATION, LUMBAR/LUMBOSACRAL DISC  . CERVICALGIA  . LEG CRAMPS  . SCOLIOSIS, LUMBAR SPINE  . SYNCOPE AND COLLAPSE  . INCONTINENCE/ENURESIS, NOS  . HX, PERSONAL, MALIGNANCY, SKIN MELANOMA  . PACEMAKER-[employer]  . Encounter for long-term (current) use of anticoagulants  . Encounter for end of life care  . Diastolic CHF, chronic  . Atrial fibrillation  . GERD (gastroesophageal reflux disease)  . Orthopnea  . Chronic kidney disease (CKD), stage IV (severe)  . Nonspecific abnormal finding in stool contents  . Acute posthemorrhagic anemia  . Coagulopathy  . Blood in stool  . Gastric ulcer  . Bradycardia   Past Medical History: Past Medical History  Diagnosis Date  . Cardiac pacemaker     Dr. Brodie; [employer] VVI  . Macular hole of left eye     told will lose sight  . NICM (nonischemic cardiomyopathy)   . Atrial fibrillation     off anticoagulation presumed to anemia  . Coronary atherosclerosis     and old MI and hx of CVA   . Depression   . Dysphagia   . DM (diabetes mellitus)     nueropathy  . CRI (chronic renal insufficiency)     cr basleine 1.5-1.6  . HTN (hypertension)   . HLD (hyperlipidemia)   . Incontinence   . CHF (congestive heart failure)     related to dyastolic dysfunction  . CAD (coronary artery disease)     nonobstructive  . Osteoarthritis   . Scoliosis   . Repeated falls       hx  . Stasis dermatitis   . Hearing loss   . Anemia   . CHF (congestive heart failure)     cardiacc ath EF 25-30%  . Permanent atrial fibrillation   . Cancer   . Stroke   . Symptomatic bradycardia     sp PPM 1997   Past Surgical History: Past Surgical History  Procedure Date  . Bliateral foot surgery 08/19/01  . Bladder tack 08/19/01  . Hysterectomy and bso 08/19/01  . Melanoma excision 08/19/01  . Pacemaker placement 09/27/1995, 04/27/2005    SJM pacemaker implanted by Dr Brodie, generator change 04/27/05  . Tonsillectomy   . Appendectomy     . Esophagogastroduodenoscopy 11/02/2011    Procedure: ESOPHAGOGASTRODUODENOSCOPY (EGD);  Surgeon: John N Perry, MD;  Location: [facility];  Service: Endoscopy;  Laterality: N/A;   Social History: History  Substance Use Topics  . Smoking status: Never Smoker   . Smokeless tobacco: Not on file     Comment: non smoker   . Alcohol Use: No   For any additional social history documentation, please refer to relevant sections of EMR.  Family History: Family History  Problem Relation Age of Onset  . Heart attack      family hx  . Colon cancer      family hx  . Stroke      family hx  . Diabetes      DM - family hx    Allergies: Allergies  Allergen Reactions  . Paroxetine Other (See Comments)    hallucinations and falls  . Codeine Phosphate Rash  . Hydrocodone Itching and Nausea Only    REACTION: nausea and pruritis  . Tramadol Hcl Hives and Other (See Comments)    Doesn't remember    No current facility-administered medications on file prior to encounter.   Current Outpatient Prescriptions on File Prior to Encounter  Medication Sig Dispense Refill  . antiseptic oral rinse (BIOTENE) LIQD 15 mLs by Mouth Rinse route 2 (two) times daily.      . atorvastatin (LIPITOR) 10 MG tablet Take 10 mg by mouth daily.      . glimepiride (AMARYL) 4 MG tablet Take 4 mg by mouth daily before breakfast.      . NON FORMULARY Oxygen; 2 L, Dx 428.22 86% on RA       . pantoprazole (PROTONIX) 40 MG tablet Take 1 tablet (40 mg total) by mouth daily at 6 (six) AM.  30 tablet  3  . torsemide (DEMADEX) 20 MG tablet Take 1 tablet (20 mg total) by mouth daily.      . diltiazem (CARDIZEM CD) 120 MG 24 hr capsule Take 1 capsule (120 mg total) by mouth daily.  30 capsule  3  . docusate sodium (COLACE) 100 MG capsule Take 100 mg by mouth 2 (two) times daily as needed. For constipation      . feeding supplement (ENSURE COMPLETE) LIQD Take 237 mLs by mouth 3 (three) times daily with meals.      . Multiple  Vitamin (MULTIVITAMIN WITH MINERALS) TABS Take 1 tablet by mouth daily.       Review Of Systems: Per HPI, Otherwise 12 point review of systems was performed and was unremarkable.  Physical Exam: BP 147/72  Pulse 93  Temp 101.5 F (38.6 C)  Resp 29  SpO2 98% Exam:  Gen: Awake, alert,moderate distress with slightly increased WOB with non-rebreather in upright position  HEENT: NCAT, EOMI, PERRL, MMM  CV: irregulary irregular, good   S1/S2, no murmur Resp: Crackles heard from bases to scapula, mild labor, abdominal breathing Abd: Soft, mild tenderness to palpation of RLQ, Suprapubic area, LUQ Ext: 1+ dependent edema on BL LE, 2+ DP pulses Moves all extremities Neuro: Alert and oriented, No gross deficits, 5/5 strength in BL upper and lower extremities.  Skin:  Approx 5 cm circular gray scar on L medial malleolus  Labs and Imaging: CBC BMET   Lab 02/18/12 1629  WBC 14.8*  HGB 8.4*  HCT 28.8*  PLT 279    Lab 02/18/12 1629  NA 139  K 4.0  CL 95*  CO2 23  BUN 30*  CREATININE 1.74*  GLUCOSE 317*  CALCIUM 9.6    UA: Cloudy, 100 protein, Trace LE, few squam, many bact,  UCx pending  ABG: pH 7.4, pCO2 35, pO2 130, )2 sat 99% Pro BNP: 34797  CXR 02/18/2012 IMPRESSION:  Cardiomegaly.  Pulmonary vascular congestion superimposed on chronic changes.  Retrocardiac opacity may represent crowding of vessels or  atelectasis however, infiltrate not excluded.   Assessment and Plan: Lillan F Miler is a 79 y.o. year old female with PMH of dCHF, DM2, HTN, CAD presenting with dyspnea after aspirating some milk and progressive lower extremity edema. Given her fever, tachycardia, tachypnea, leukocytosis, and continued need for oxygen we will admit her to stepdown for very close observation and support.   Sepsis and dyspnea - Likely due to a combination of aspiration pneumonia, Chemical pneumonitis, and acute on chronic diastolic CHF exacerbation - Stable on a non re-breather currently and  hemodynamically stable, T max 103.1. Transition to  as tolerated - CXR with possible consolidation, pulmonary vascular congestion. Repeat CXR tomorrow - WBC 14.8, BNP 34,797 - O2 to keep sats >92% - IV zosyn for aspiration, as well as fever. (Consider adding Vanc for HCAP coverage giving her recent d/c from SNF) - Lasix 80 mg IV BID for now, but decrease dose once she has good diuresis. Closely monitor I/O's - monitor respiratory status - CBC in the Am, Troponin X 3, TTE in the Am  Wide complex tachycardia - seen earlier in the ED with LBBB - resolved after 150 IV amiodarone, will not continue at this point  DM2 - Glucose 317 this PM - Hold home glimepiride - SSI, monitor CBGs  HTN - 136-185/77-99 - Continue home meds: coreg, diltiazem (currently on IV titrated, but will make PO once stable)  Anemia - Hgb 8.4 - Iron panel, in the past has been low iron, low TIBC, low saturation ratio, MCV 93 - consider that CHF patient can have decreased Iron absorption- consider IV iron tomorrow pending iron levels.   CAD - Continue lipitor, monitor, EKG in the Am - Will get fasting lipid panel in the AM  Afib and sick sinus syndrome - Pacemaker in place for SSS - Bradycardia down to 38 tonight, so possibly not working properly - will have pace maker interrogated - did not tolerate warfarin in the past, and cardiology notes indicate that patient will not consider anticoagulation therefore we will not start this.  CKD4 - KVO Fluids, Diuresis as above, monitor with daily BMP (consider more frequent Bmet while on high dose lasix given her CKD4)  FEN/GI: Heart healthy/carb modified Prophylaxis: Lovenox Disposition: Step down Code Status: DNR  Bradshaw, Samuel, MD 02/18/2012, 6:37 PM   PGY-2 Addendum: I have seen and examined patient. I agree with Dr. Bradshaw's note above with additional changes.  Robertt Buda M. Nicoles Sedlacek, M.D. 02/18/2012 9:31 PM   

## 2012-02-18 NOTE — ED Notes (Signed)
Pt lives at home EMS was called of resp. Distress. Pt Hx of COPD - CHF . 1 NGT SL given and breathing treatment. Pt arrived to ED on C-PAP. Pt. Alert anxious.

## 2012-02-18 NOTE — Telephone Encounter (Addendum)
Called pt to speak to her about her meds. She stated that when she was in the Nursing facility they gave her meds for asthma and she stated that she does not have asthma and they refused to renew her Rx's she is only taking her "pee pill" now. I asked the pt to tell me all of the meds that they were giving her while she was at [facility]:  Ferrous sulfate, carvedilol, diltiazem,glimepiride,omeprazole,simvastatin,torsemide,warfin,and lipitor. I informed pt that she will need to continue taking these medications as directed. Should she have any additional questions to please call back. Pt voiced understanding and agreed.Loralee Pacas [place]

## 2012-02-18 NOTE — ED Notes (Signed)
Bi-pap d/c per resp. - changed to 100% non reb 15liters

## 2012-02-18 NOTE — ED Notes (Signed)
CBG in ED 283

## 2012-02-19 ENCOUNTER — Inpatient Hospital Stay (HOSPITAL_COMMUNITY): Payer: Medicare Other

## 2012-02-19 ENCOUNTER — Encounter (HOSPITAL_COMMUNITY): Payer: Self-pay | Admitting: Neurology

## 2012-02-19 DIAGNOSIS — I472 Ventricular tachycardia: Secondary | ICD-10-CM

## 2012-02-19 DIAGNOSIS — A0472 Enterocolitis due to Clostridium difficile, not specified as recurrent: Secondary | ICD-10-CM

## 2012-02-19 DIAGNOSIS — I4891 Unspecified atrial fibrillation: Secondary | ICD-10-CM

## 2012-02-19 DIAGNOSIS — R000 Tachycardia, unspecified: Secondary | ICD-10-CM

## 2012-02-19 DIAGNOSIS — J690 Pneumonitis due to inhalation of food and vomit: Secondary | ICD-10-CM

## 2012-02-19 DIAGNOSIS — I5033 Acute on chronic diastolic (congestive) heart failure: Secondary | ICD-10-CM

## 2012-02-19 DIAGNOSIS — I5040 Unspecified combined systolic (congestive) and diastolic (congestive) heart failure: Secondary | ICD-10-CM

## 2012-02-19 LAB — IRON AND TIBC
Iron: <10 ug/dL — ABNORMAL LOW (ref 42–135)
UIBC: 199 ug/dL (ref 125–400)

## 2012-02-19 LAB — BASIC METABOLIC PANEL
BUN: 35 mg/dL — ABNORMAL HIGH (ref 6–23)
Calcium: 9.2 mg/dL (ref 8.4–10.5)
Calcium: 8.9 mg/dL (ref 8.4–10.5)
Creatinine, Ser: 1.76 mg/dL — ABNORMAL HIGH (ref 0.50–1.10)
GFR calc non Af Amer: 26 mL/min — ABNORMAL LOW (ref >90)
GFR calc non Af Amer: 23 mL/min — ABNORMAL LOW (ref >90)
Glucose, Bld: 137 mg/dL — ABNORMAL HIGH (ref 70–99)
Glucose, Bld: 239 mg/dL — ABNORMAL HIGH (ref 70–99)
Sodium: 144 mEq/L (ref 135–145)

## 2012-02-19 LAB — LIPID PANEL
HDL: 36 mg/dL — ABNORMAL LOW (ref >39)
LDL Cholesterol: 71 mg/dL (ref 0–99)
Total CHOL/HDL Ratio: 3.4 RATIO
VLDL: 17 mg/dL (ref 0–40)

## 2012-02-19 LAB — MRSA PCR SCREENING: MRSA by PCR: NEGATIVE

## 2012-02-19 LAB — CBC
MCH: 28.4 pg (ref 26.0–34.0)
Platelets: 156 10*3/uL (ref 150–400)
RBC: 2.78 MIL/uL — ABNORMAL LOW (ref 3.87–5.11)
RDW: 15.8 % — ABNORMAL HIGH (ref 11.5–15.5)

## 2012-02-19 LAB — GLUCOSE, CAPILLARY: Glucose-Capillary: 148 mg/dL — ABNORMAL HIGH (ref 70–99)

## 2012-02-19 LAB — FOLATE: Folate: 7.5 ng/mL

## 2012-02-19 MED ORDER — POTASSIUM CHLORIDE CRYS ER 20 MEQ PO TBCR
40.0000 meq | EXTENDED_RELEASE_TABLET | Freq: Once | ORAL | Status: AC
  Administered 2012-02-19: 40 meq via ORAL
  Filled 2012-02-19: qty 2

## 2012-02-19 MED ORDER — PIPERACILLIN-TAZOBACTAM 3.375 G IVPB
3.3750 g | Freq: Three times a day (TID) | INTRAVENOUS | Status: DC
  Administered 2012-02-19 – 2012-02-24 (×14): 3.375 g via INTRAVENOUS
  Filled 2012-02-19 (×17): qty 50

## 2012-02-19 MED ORDER — FERUMOXYTOL INJECTION 510 MG/17 ML
510.0000 mg | Freq: Once | INTRAVENOUS | Status: AC
  Administered 2012-02-19: 510 mg via INTRAVENOUS
  Filled 2012-02-19: qty 17

## 2012-02-19 MED ORDER — VANCOMYCIN HCL 1000 MG IV SOLR
750.0000 mg | INTRAVENOUS | Status: DC
  Administered 2012-02-19 – 2012-02-20 (×2): 750 mg via INTRAVENOUS
  Filled 2012-02-19 (×3): qty 750

## 2012-02-19 MED ORDER — FUROSEMIDE 10 MG/ML IJ SOLN
40.0000 mg | Freq: Two times a day (BID) | INTRAMUSCULAR | Status: DC
  Administered 2012-02-19: 40 mg via INTRAVENOUS
  Filled 2012-02-19 (×2): qty 4

## 2012-02-19 MED ORDER — FUROSEMIDE 10 MG/ML IJ SOLN: 40.0000 mg | Freq: Two times a day (BID) | INTRAMUSCULAR | Status: DC

## 2012-02-19 MED ORDER — DOCUSATE SODIUM 100 MG PO CAPS
100.0000 mg | ORAL_CAPSULE | Freq: Every day | ORAL | Status: DC
  Administered 2012-02-19 – 2012-03-02 (×4): 100 mg via ORAL
  Filled 2012-02-19 (×15): qty 1

## 2012-02-19 MED ORDER — DILTIAZEM HCL ER COATED BEADS 120 MG PO CP24
120.0000 mg | ORAL_CAPSULE | Freq: Every day | ORAL | Status: DC
  Administered 2012-02-19 – 2012-03-04 (×15): 120 mg via ORAL
  Filled 2012-02-19 (×15): qty 1
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
## 2012-02-19 NOTE — H&P (Signed)
Family Medicine Teaching [facility] Admission History and Physical Service Pager: [phone]  Patient name: [name] Medical record number: [id number] Date of birth: [birth year] Age: 79 y.o. Gender: female  Primary Care Provider: Sanjuana Letters, MD  Chief Complaint: Shortness of breath  History of Present Illness: [name] is a 79 y.o. year old female presenting via EMS after she "got strangled"on some milk today at noon. She states she was at home with her daughter when she choked on some milk and couldn't catch her breath. She continued to feel short of breath and so they called EMS. She denies a recent history of aspiration other than this event. She also states that for the last three weeks she's had gradually worsening leg swelling, but insists that she was not dyspneic until she choked on her milk today. She also notes chest pain that coincides with her dyspnea that started after the event. She denies wheezing, prior cough, congestion, abdominal pain, previous chest pain, or previous palpations, and dysuria. Additionally she's been nauseous since noon but no emesis. She uses 2 L of O2 via [place] at home.   Since arriving in the Ed she has  Had 150 mg of amiodarone, been started on a diltiazem drip due to wide complex tachycardia at arrival, and zosyn for aspiration. She was also supported with a BIPAP initially but has since become stable on a non-re breather. She has improved some in the ED, and reports feeling better.  Patient Active Problem List  Diagnosis  . DIABETIC PERIPHERAL NEUROPATHY  . DIABETES MELLITUS, II, COMPLICATIONS  . HYPERCHOLESTEROLEMIA  . HYPERTRIGLYCERIDEMIA  . ANEMIA NEC  . DEPRESSIVE DISORDER, NOS  . MACULAR DEGENERATION, SENILE  . UNSPECIFIED HEARING LOSS  . HYPERTENSION, BENIGN SYSTEMIC  . MYOCARDIAL INFARCTION, OLD  . CORONARY, ARTERIOSCLEROSIS  . HOCM / IHSS  . ATRIAL FIBRILLATION  . CVA  . STASIS DERMATITIS  . DIVERTICULOSIS, COLON  .  OSTEOARTHRITIS, MULTI SITES  . DEGENERATION, LUMBAR/LUMBOSACRAL DISC  . CERVICALGIA  . LEG CRAMPS  . SCOLIOSIS, LUMBAR SPINE  . SYNCOPE AND COLLAPSE  . INCONTINENCE/ENURESIS, NOS  . HX, PERSONAL, MALIGNANCY, SKIN MELANOMA  . PACEMAKER-St.Jude  . Encounter for long-term (current) use of anticoagulants  . Encounter for end of life care  . Diastolic CHF, chronic  . Atrial fibrillation  . GERD (gastroesophageal reflux disease)  . Orthopnea  . Chronic kidney disease (CKD), stage IV (severe)  . Nonspecific abnormal finding in stool contents  . Acute posthemorrhagic anemia  . Coagulopathy  . Blood in stool  . Gastric ulcer  . Bradycardia   Past Medical History: Past Medical History  Diagnosis Date  . Cardiac pacemaker     Dr. Juanda Chance; St. Jude VVI  . Macular hole of left eye     told will lose sight  . NICM (nonischemic cardiomyopathy)   . Atrial fibrillation     off anticoagulation presumed to anemia  . Coronary atherosclerosis     and old MI and hx of CVA   . Depression   . Dysphagia   . DM (diabetes mellitus)     nueropathy  . CRI (chronic renal insufficiency)     cr basleine 1.5-1.6  . HTN (hypertension)   . HLD (hyperlipidemia)   . Incontinence   . CHF (congestive heart failure)     related to dyastolic dysfunction  . CAD (coronary artery disease)     nonobstructive  . Osteoarthritis   . Scoliosis   . Repeated falls  hx  . Stasis dermatitis   . Hearing loss   . Anemia   . CHF (congestive heart failure)     cardiacc ath EF 25-30%  . Permanent atrial fibrillation   . Cancer   . Stroke   . Symptomatic bradycardia     sp PPM 1997   Past Surgical History: Past Surgical History  Procedure Date  . Bliateral foot surgery 08/19/01  . Bladder tack 08/19/01  . Hysterectomy and bso 08/19/01  . Melanoma excision 08/19/01  . Pacemaker placement 09/27/1995, 04/27/2005    SJM pacemaker implanted by Dr Juanda Chance, generator change 04/27/05  . Tonsillectomy   . Appendectomy     . Esophagogastroduodenoscopy 11/02/2011    Procedure: ESOPHAGOGASTRODUODENOSCOPY (EGD);  Surgeon: Hilarie Fredrickson, MD;  Location: [facility] ENDOSCOPY;  Service: Endoscopy;  Laterality: N/A;   Social History: History  Substance Use Topics  . Smoking status: Never Smoker   . Smokeless tobacco: Not on file     Comment: non smoker   . Alcohol Use: No   For any additional social history documentation, please refer to relevant sections of EMR.  Family History: Family History  Problem Relation Age of Onset  . Heart attack      family hx  . Colon cancer      family hx  . Stroke      family hx  . Diabetes      DM - family hx    Allergies: Allergies  Allergen Reactions  . Paroxetine Other (See Comments)    hallucinations and falls  . Codeine Phosphate Rash  . Hydrocodone Itching and Nausea Only    REACTION: nausea and pruritis  . Tramadol Hcl Hives and Other (See Comments)    Doesn't remember    No current facility-administered medications on file prior to encounter.   Current Outpatient Prescriptions on File Prior to Encounter  Medication Sig Dispense Refill  . antiseptic oral rinse (BIOTENE) LIQD 15 mLs by Mouth Rinse route 2 (two) times daily.      Marland Kitchen atorvastatin (LIPITOR) 10 MG tablet Take 10 mg by mouth daily.      Marland Kitchen glimepiride (AMARYL) 4 MG tablet Take 4 mg by mouth daily before breakfast.      . NON FORMULARY Oxygen; 2 L, Dx 428.22 86% on RA       . pantoprazole (PROTONIX) 40 MG tablet Take 1 tablet (40 mg total) by mouth daily at 6 (six) AM.  30 tablet  3  . torsemide (DEMADEX) 20 MG tablet Take 1 tablet (20 mg total) by mouth daily.      Marland Kitchen diltiazem (CARDIZEM CD) 120 MG 24 hr capsule Take 1 capsule (120 mg total) by mouth daily.  30 capsule  3  . docusate sodium (COLACE) 100 MG capsule Take 100 mg by mouth 2 (two) times daily as needed. For constipation      . feeding supplement (ENSURE COMPLETE) LIQD Take 237 mLs by mouth 3 (three) times daily with meals.      . Multiple  Vitamin (MULTIVITAMIN WITH MINERALS) TABS Take 1 tablet by mouth daily.       Review Of Systems: Per HPI, Otherwise 12 point review of systems was performed and was unremarkable.  Physical Exam: BP 147/72  Pulse 93  Temp 101.5 F (38.6 C)  Resp 29  SpO2 98% Exam:  Gen: Awake, alert,moderate distress with slightly increased WOB with non-rebreather in upright position  HEENT: NCAT, EOMI, PERRL, MMM  CV: irregulary irregular, good  S1/S2, no murmur Resp: Crackles heard from bases to scapula, mild labor, abdominal breathing Abd: Soft, mild tenderness to palpation of RLQ, Suprapubic area, LUQ Ext: 1+ dependent edema on BL LE, 2+ DP pulses Moves all extremities Neuro: Alert and oriented, No gross deficits, 5/5 strength in BL upper and lower extremities.  Skin:  Approx 5 cm circular gray scar on L medial malleolus  Labs and Imaging: CBC BMET   Lab 02/18/12 1629  WBC 14.8*  HGB 8.4*  HCT 28.8*  PLT 279    Lab 02/18/12 1629  NA 139  K 4.0  CL 95*  CO2 23  BUN 30*  CREATININE 1.74*  GLUCOSE 317*  CALCIUM 9.6    UA: Cloudy, 100 protein, Trace LE, few squam, many bact,  UCx pending  ABG: pH 7.4, pCO2 35, pO2 130, )2 sat 99% Pro BNP: 34797  CXR 02/18/2012 IMPRESSION:  Cardiomegaly.  Pulmonary vascular congestion superimposed on chronic changes.  Retrocardiac opacity may represent crowding of vessels or  atelectasis however, infiltrate not excluded.   Assessment and Plan: HARBOR PASTER is a 79 y.o. year old female with PMH of dCHF, DM2, HTN, CAD presenting with dyspnea after aspirating some milk and progressive lower extremity edema. Given her fever, tachycardia, tachypnea, leukocytosis, and continued need for oxygen we will admit her to stepdown for very close observation and support.   Sepsis and dyspnea - Likely due to a combination of aspiration pneumonia, Chemical pneumonitis, and acute on chronic diastolic CHF exacerbation - Stable on a non re-breather currently and  hemodynamically stable, T max 103.1. Transition to [place] as tolerated - CXR with possible consolidation, pulmonary vascular congestion. Repeat CXR tomorrow - WBC 14.8, BNP 34,797 - O2 to keep sats >92% - IV zosyn for aspiration, as well as fever. (Consider adding Vanc for HCAP coverage giving her recent d/c from SNF) - Lasix 80 mg IV BID for now, but decrease dose once she has good diuresis. Closely monitor I/O's - monitor respiratory status - CBC in the Am, Troponin X 3, TTE in the Am  Wide complex tachycardia - seen earlier in the ED with LBBB - resolved after 150 IV amiodarone, will not continue at this point  DM2 - Glucose 317 this PM - Hold home glimepiride - SSI, monitor CBGs  HTN - 136-185/77-99 - Continue home meds: coreg, diltiazem (currently on IV titrated, but will make PO once stable)  Anemia - Hgb 8.4 - Iron panel, in the past has been low iron, low TIBC, low saturation ratio, MCV 93 - consider that CHF patient can have decreased Iron absorption- consider IV iron tomorrow pending iron levels.   CAD - Continue lipitor, monitor, EKG in the Am - Will get fasting lipid panel in the AM  Afib and sick sinus syndrome - Pacemaker in place for SSS - Bradycardia down to 38 tonight, so possibly not working properly - will have Visual merchandiser interrogated - did not tolerate warfarin in the past, and cardiology notes indicate that patient will not consider anticoagulation therefore we will not start this.  CKD4 - KVO Fluids, Diuresis as above, monitor with daily BMP (consider more frequent Bmet while on high dose lasix given her CKD4)  FEN/GI: Heart healthy/carb modified Prophylaxis: Lovenox Disposition: Step down Code Status: DNR  Kevin Fenton, MD 02/18/2012, 6:37 PM   PGY-2 Addendum: I have seen and examined patient. I agree with Dr. Felipa Emory note above with additional changes.  Amber M. Hairford, M.D. 02/18/2012 9:31 PM

## 2012-02-19 NOTE — Progress Notes (Signed)
Family Medicine Teaching Service Daily Progress Note Service Page: [phone]  Patient Assessment:  79 y.o. year old female with PMH of dCHF, DM2, HTN, CAD presenting with dyspnea after aspirating some milk, progressive lower extremity edema, and wide complex tachycardia.     Subjective:  Breathing easier this am, some L leg pain for the last 2 to 3 weeks. Denies chest pain. Good appetite.    Objective: Temp:  [97.9 F (36.6 C)-103.1 F (39.5 C)] 98.8 F (37.1 C) (01/07 0330) Pulse Rate:  [38-152] 64  (01/07 0400) Resp:  [20-38] 20  (01/07 0400) BP: (123-185)/(57-115) 123/57 mmHg (01/07 0400) SpO2:  [93 %-100 %] 96 % (01/07 0400) Weight:  [167 lb 1.7 oz (75.8 kg)-168 lb 1.6 oz (76.25 kg)] 168 lb 1.6 oz (76.25 kg) (01/07 0500) Exam:  Gen: Awake, alert, NAD, breathing easy on 5 L via [place] HEENT: NCAT, EOMI, PERRL, MMM  CV: irregulary irregular, good S1/S2, no murmur  Resp: Crackles heard from bases to scapula, mild labor, abdominal breathing  Abd: SNTND, voluntary guarding throughout, + BS Ext: 1+ dependent edema on BL LE, 2+ DP pulses Neuro: Alert and oriented, No gross deficits, 5/5 strength in BL upper and lower extremities.  Skin: Approx 5 cm circular gray scar on L medial malleolus   I have reviewed the patient's medications, labs, imaging, and diagnostic testing.  Notable results are summarized below.  CBC BMET   Lab 02/19/12 0149 02/18/12 1629  WBC 13.3* 14.8*  HGB 7.9* 8.4*  HCT 25.3* 28.8*  PLT 156 279    Lab 02/19/12 0149 02/18/12 1629  NA 144 139  K 3.2* 4.0  CL 103 95*  CO2 29 23  BUN 30* 30*  CREATININE 1.76* 1.74*  GLUCOSE 137* 317*  CALCIUM 9.2 9.6    Pro BNP: 34797  Imaging/Diagnostic Tests:   CXR 02/18/2012  IMPRESSION:  Cardiomegaly.  Pulmonary vascular congestion superimposed on chronic changes.  Retrocardiac opacity may represent crowding of vessels or  atelectasis however, infiltrate not excluded.  CXR [id number] IMPRESSION:  Interval  worsening in the degree of aeration left lung base may  represent pleural fluid with atelectasis and / or infiltrate.  Recommend follow-up until clearance.  Cardiomegaly with central pulmonary vascular prominence and small  right-sided pleural effusion raising possibility of mild congestive  heart failure.   Plan: [name] is a 79 y.o. year old female with PMH of dCHF, DM2, HTN, CAD presenting with dyspnea after aspirating some milk, progressive lower extremity edema, and wide complex tachycardia.    Sepsis and dyspnea  - Likely due to a combination of aspiration pneumonia, Chemical pneumonitis, and acute on chronic diastolic CHF exacerbation  - Stable on 5L O2 via [place] currently and hemodynamically stable, afebrile currently.  - CXR with possible consolidation, pulmonary vascular congestion, L pleural effusion  _ L lat decubitus to eval for need of pleurocentesis - WBC 14.8 > 13.3, BNP 34,797, last fever of 100.9 at 1945 last night - IV zosyn for aspiration pneumonia, added Vanc to cover for HCAP - Lasix 80 mg this am and last night, decrease to 40 BID and monitor diuresis  - 950 mL out since admission - monitor respiratory status  - Troponin neg times 3 - TTE today  Wide complex tachycardia - resolved - seen in the ED with LBBB  - resolved after 150 IV amiodarone, will not continue at this point  - Monitor on tele  DM2  - Glucose 137 this AM  - Hold  home glimepiride  - SSI, monitor CBGs   HTN  - controlled this am, not hypotensiove - Continue home meds: coreg, diltiazem (currently on IV titrated, but will make PO once stable)   Anemia  - Hgb 7.9 this am, 8.4 on admission - Iron panel, in the past has been low iron, low TIBC, low saturation ratio, MCV 93  - consider that CHF patient can have decreased Iron absorption- consider IV iron tomorrow pending iron levels.   CAD  - Continue lipitor, monitor, EKG in the Am  - LDL 71 this am  Afib and sick sinus syndrome  -  Pacemaker in place for SSS  - Bradycardia down to 38 in EDt, so possibly not working properly  - will have pace maker interrogated  - did not tolerate warfarin in the past, and cardiology notes indicate that patient will not consider anticoagulation therefore we will not start this.   CKD4  - KVO Fluids, Diuresis as above, monitor with BID BMP   FEN/GI: Heart healthy/carb modified  Prophylaxis: Lovenox  Disposition: Step down  Code Status: DNR   Kevin Fenton, MD 02/19/2012, 7:09 AM

## 2012-02-19 NOTE — H&P (Signed)
I have seen and examined this patient. I have discussed with Dr(s) Ermalinda Memos and Hairford.  I agree with their findings and plans as documented in their admission notes note.  Acute Issues 1. Healthcare associated Pneumonia with possible parapneumonic effusion - LLL opacity on CHEST XRAY  2. Acute Decompensated Heart Failure - History of heart failure with preserved ejection fraction by Echo 06/19/2011. - Pulmonary Hypertension by Echo 06/19/11  3.  Possible aspiration event with eating yesterday  4. Wide-complex tachycardia - resolved after dose of amiodarone in ED  5. Sick Sinus Syndrome with pacemaker inplace - Episode of bradycardia overnite - Concern for proper pacemaker functioning  6. Code Status: (+)DNR  Plan: -Lateral decubitus CHEST XRAY in process of risk-stratifying the possible parapneumonic effussion as simple (antibiotic-responsive) vs complicated (requiring invasive drainage). - Agree with Vanc and Zosyn therapy - Agree with aggressive diuresis with close monitoring of GFR. - Agree with interogation of pacemaker for appropriate functioning.  - Consultation with Speech therapy for swallow evaluation given possible aspiration event prior to admission.

## 2012-02-19 NOTE — Progress Notes (Signed)
FMTS Attending Note Patient's case discussed with resident team, I agree with plan as per Dr Felipa Emory progress note for today.  Patient is being treated for presumed aspiration PNA.  To monitor clinical progress with abx and with diuresis; consider repeat lateral decub xray chest to look for interval change in the effusion.   Paula Compton, MD

## 2012-02-19 NOTE — Progress Notes (Signed)
ANTIBIOTIC CONSULT NOTE - INITIAL  Pharmacy Consult for vancomycin Indication: HCAP  Allergies  Allergen Reactions  . Paroxetine Other (See Comments)    hallucinations and falls  . Codeine Phosphate Rash  . Hydrocodone Itching and Nausea Only    REACTION: nausea and pruritis  . Tramadol Hcl Hives and Other (See Comments)    Doesn't remember     Patient Measurements: Height: 5\' 5"  (165.1 cm) Weight: 168 lb 1.6 oz (76.25 kg) IBW/kg (Calculated) : 57    Vital Signs: Temp: 98.3 F (36.8 C) (01/07 0725) Temp src: Oral (01/07 0725) BP: 135/63 mmHg (01/07 0725) Pulse Rate: 74  (01/07 0725) Intake/Output from previous day: 01/06 0701 - 01/07 0700 In: 768 [I.V.:268; IV Piggyback:500] Out: 950 [Urine:950] Intake/Output from this shift:    Labs:  Basename 02/19/12 0149 02/18/12 1629  WBC 13.3* 14.8*  HGB 7.9* 8.4*  PLT 156 279  LABCREA -- --  CREATININE 1.76* 1.74*   Estimated Creatinine Clearance: 26.5 ml/min (by C-G formula based on Cr of 1.76). No results found for this basename: VANCOTROUGH:2,VANCOPEAK:2,VANCORANDOM:2,GENTTROUGH:2,GENTPEAK:2,GENTRANDOM:2,TOBRATROUGH:2,TOBRAPEAK:2,TOBRARND:2,AMIKACINPEAK:2,AMIKACINTROU:2,AMIKACIN:2, in the last 72 hours   Microbiology: Recent Results (from the past 720 hour(s))  MRSA PCR SCREENING     Status: Normal   Collection Time   02/18/12 10:49 PM      Component Value Range Status Comment   MRSA by PCR NEGATIVE  NEGATIVE Final     Medical History: Past Medical History  Diagnosis Date  . Cardiac pacemaker     Dr. Juanda Chance; [employer] Jude VVI  . Macular hole of left eye     told will lose sight  . NICM (nonischemic cardiomyopathy)   . Atrial fibrillation     off anticoagulation presumed to anemia  . Coronary atherosclerosis     and old MI and hx of CVA   . Depression   . Dysphagia   . DM (diabetes mellitus)     nueropathy  . CRI (chronic renal insufficiency)     cr basleine 1.5-1.6  . HTN (hypertension)   . HLD  (hyperlipidemia)   . Incontinence   . CHF (congestive heart failure)     related to dyastolic dysfunction  . CAD (coronary artery disease)     nonobstructive  . Osteoarthritis   . Scoliosis   . Repeated falls     hx  . Stasis dermatitis   . Hearing loss   . Anemia   . CHF (congestive heart failure)     cardiacc ath EF 25-30%  . Permanent atrial fibrillation   . Cancer   . Stroke   . Symptomatic bradycardia     sp PPM 1997   Assessment: 79 year old female who recently aspirated on some milk and now with CXR concerning for possible consolidation. Given recent admission, broad empiric antibiotics of vancomycin and zosyn were ordered. tmax overnight was 103.1, wbc is now trending down to 13. Scr impaired at 1.7, will adjust vancomycin dosing accordingly. Zosyn does not require adjusting at this time.  Goal of Therapy:  Vancomycin trough level 15-20 mcg/ml  Plan:  Measure antibiotic drug levels at steady state Follow up culture results Vancomycin 1500mg  load, will continue with 750mg  q 24 hours Will continue zosyn at 3.375g IV q8 hours - 4h infusion   Severiano Gilbert 02/19/2012,10:32 AM

## 2012-02-20 ENCOUNTER — Encounter (HOSPITAL_COMMUNITY): Payer: Self-pay | Admitting: *Deleted

## 2012-02-20 DIAGNOSIS — I059 Rheumatic mitral valve disease, unspecified: Secondary | ICD-10-CM

## 2012-02-20 DIAGNOSIS — M7989 Other specified soft tissue disorders: Secondary | ICD-10-CM

## 2012-02-20 DIAGNOSIS — M79609 Pain in unspecified limb: Secondary | ICD-10-CM

## 2012-02-20 LAB — URINE CULTURE: Colony Count: >=100000

## 2012-02-20 LAB — BASIC METABOLIC PANEL
BUN: 40 mg/dL — ABNORMAL HIGH (ref 6–23)
BUN: 43 mg/dL — ABNORMAL HIGH (ref 6–23)
Calcium: 9.3 mg/dL (ref 8.4–10.5)
Chloride: 98 mEq/L (ref 96–112)
Creatinine, Ser: 2.13 mg/dL — ABNORMAL HIGH (ref 0.50–1.10)
Creatinine, Ser: 2.37 mg/dL — ABNORMAL HIGH (ref 0.50–1.10)
GFR calc Af Amer: 21 mL/min — ABNORMAL LOW (ref >90)
GFR calc non Af Amer: 21 mL/min — ABNORMAL LOW (ref >90)
GFR calc non Af Amer: 18 mL/min — ABNORMAL LOW (ref >90)
Glucose, Bld: 138 mg/dL — ABNORMAL HIGH (ref 70–99)
Potassium: 4.6 mEq/L (ref 3.5–5.1)
Sodium: 136 mEq/L (ref 135–145)

## 2012-02-20 LAB — CBC
HCT: 24.9 % — ABNORMAL LOW (ref 36.0–46.0)
MCH: 28 pg (ref 26.0–34.0)
MCHC: 30.5 g/dL (ref 30.0–36.0)
MCV: 91.9 fL (ref 78.0–100.0)
Platelets: 146 10*3/uL — ABNORMAL LOW (ref 150–400)
RDW: 16.1 % — ABNORMAL HIGH (ref 11.5–15.5)

## 2012-02-20 LAB — GLUCOSE, CAPILLARY
Glucose-Capillary: 186 mg/dL — ABNORMAL HIGH (ref 70–99)
Glucose-Capillary: 193 mg/dL — ABNORMAL HIGH (ref 70–99)
Glucose-Capillary: 169 mg/dL — ABNORMAL HIGH (ref 70–99)

## 2012-02-20 MED ORDER — FUROSEMIDE 10 MG/ML IJ SOLN
10.0000 mg/h | INTRAVENOUS | Status: DC
  Administered 2012-02-20: 10 mg/h via INTRAVENOUS
  Filled 2012-02-20 (×4): qty 25

## 2012-02-20 MED ORDER — OXYCODONE HCL 5 MG PO TABS
5.0000 mg | ORAL_TABLET | Freq: Three times a day (TID) | ORAL | Status: DC | PRN
  Administered 2012-02-20 – 2012-03-02 (×18): 5 mg via ORAL
  Filled 2012-02-20 (×19): qty 1

## 2012-02-20 MED ORDER — FUROSEMIDE 10 MG/ML IJ SOLN
40.0000 mg | Freq: Once | INTRAMUSCULAR | Status: AC
  Administered 2012-02-20 – 2012-02-21 (×2): 40 mg via INTRAVENOUS
  Filled 2012-02-20: qty 4

## 2012-02-20 MED ORDER — ASPIRIN 81 MG PO CHEW
81.0000 mg | CHEWABLE_TABLET | Freq: Every day | ORAL | Status: DC
  Administered 2012-02-20 – 2012-03-04 (×13): 81 mg via ORAL
  Filled 2012-02-20 (×11): qty 1

## 2012-02-20 NOTE — Progress Notes (Signed)
Echocardiogram 2D Echocardiogram has been performed.  [name] 02/20/2012, 1:28 PM

## 2012-02-20 NOTE — Evaluation (Signed)
Occupational Therapy Evaluation Patient Details Name: [name] MRN: [id number] DOB: 17-Aug-1930 Today's Date: 02/20/2012 Time: 4098-1191 OT Time Calculation (min): 39 min  OT Assessment / Plan / Recommendation Clinical Impression  Pt with w/ PMH of dCHF, DM2, HTN, CAD presenting with dyspnea after aspirating some milk, progressive lower extremity edema, and wide complex tachycardia.  Will follow acutely to address below problem list.  Recommending 24/7 assist/supervision at home with HHOT.  If 24/7 assist unavailable, then recommending ST SNF.    OT Assessment  Patient needs continued OT Services    Follow Up Recommendations  Home health OT;SNF;Supervision/Assistance - 24 hour    Barriers to Discharge Decreased caregiver support Pt's son can spend the night at her house but otherwise pt will be home alone during day.  Equipment Recommendations  3 in 1 bedside comode    Recommendations for Other Services    Frequency  Min 2X/week    Precautions / Restrictions Precautions Precautions: Fall   Pertinent Vitals/Pain See vitals    ADL  Eating/Feeding: Performed;Independent Where Assessed - Eating/Feeding: Chair Upper Body Dressing: Performed;Minimal assistance Where Assessed - Upper Body Dressing: Unsupported sitting Lower Body Dressing: Performed;Maximal assistance Where Assessed - Lower Body Dressing: Supported sit to stand Toilet Transfer: Simulated;+2 Total assistance Toilet Transfer: Patient Percentage: 70% Toilet Transfer Method: Stand pivot;Sit to Barista: Other (comment) (bed to chair) Toileting - Clothing Manipulation and Hygiene: Performed;+1 Total assistance Where Assessed - Toileting Clothing Manipulation and Hygiene: Sit to stand from 3-in-1 or toilet Equipment Used: Rolling walker Transfers/Ambulation Related to ADLs: +2 assist for SPT from bed to chair. Limited ambulation due to diarrhea.  ADL Comments: Pt experiencing diarrhea and  required total assist for front/back peri care.      OT Diagnosis: Generalized weakness  OT Problem List: Decreased activity tolerance;Decreased strength;Impaired balance (sitting and/or standing);Decreased knowledge of use of DME or AE;Pain;Obesity OT Treatment Interventions: Self-care/ADL training;DME and/or AE instruction;Therapeutic activities;Balance training;Patient/family education   OT Goals Acute Rehab OT Goals OT Goal Formulation: With patient Time For Goal Achievement: 03/05/12 Potential to Achieve Goals: Good ADL Goals Pt Will Perform Grooming: with modified independence;Standing at sink ADL Goal: Grooming - Progress: Goal set today Pt Will Perform Upper Body Bathing: Independently;Sitting, chair;Sitting, edge of bed ADL Goal: Upper Body Bathing - Progress: Goal set today Pt Will Perform Lower Body Bathing: with modified independence;Sit to stand from chair;Sit to stand from bed ADL Goal: Lower Body Bathing - Progress: Goal set today Pt Will Perform Upper Body Dressing: Independently;Sitting, chair;Sitting, bed ADL Goal: Upper Body Dressing - Progress: Goal set today Pt Will Perform Lower Body Dressing: with modified independence;Sit to stand from chair;Sit to stand from bed ADL Goal: Lower Body Dressing - Progress: Goal set today Pt Will Transfer to Toilet: with modified independence;Ambulation;with DME;Comfort height toilet ADL Goal: Toilet Transfer - Progress: Goal set today Pt Will Perform Toileting - Clothing Manipulation: with modified independence;Sitting on 3-in-1 or toilet;Standing ADL Goal: Toileting - Clothing Manipulation - Progress: Goal set today Pt Will Perform Toileting - Hygiene: with modified independence;Sit to stand from 3-in-1/toilet ADL Goal: Toileting - Hygiene - Progress: Goal set today  Visit Information  Last OT Received On: 02/20/12 Assistance Needed: +1 (+2 for safety) PT/OT Co-Evaluation/Treatment: Yes    Subjective Data      Prior  Functioning     Home Living Lives With: Spouse Available Help at Discharge: Family;Available PRN/intermittently Type of Home: Mobile home Home Access: Stairs to enter [employer] of  Steps: 3 Entrance Stairs-Rails: Right;Left Home Layout: One level Bathroom Toilet: Handicapped height Bathroom Accessibility: Yes How Accessible: Accessible via walker Home Adaptive Equipment: Wheelchair - manual;Walker - rolling;Straight cane Prior Function Level of Independence: Needs assistance Needs Assistance: Meal Prep;Light Housekeeping Meal Prep: Moderate Light Housekeeping: Maximal Driving: No Comments: Family assists her with grocery shopping and household management Communication Communication: No difficulties         Vision/Perception     Cognition  Overall Cognitive Status: Impaired Area of Impairment: Safety/judgement Arousal/Alertness: Awake/alert Orientation Level: Appears intact for tasks assessed Behavior During Session: [facility] for tasks performed Safety/Judgement: Decreased awareness of need for assistance Safety/Judgement - Other Comments: adamant about going home despite difficulties moving around today reporting she will be able to do it when she gets home    Extremity/Trunk Assessment Right Upper Extremity Assessment RUE ROM/Strength/Tone: [facility] for tasks assessed Left Upper Extremity Assessment LUE ROM/Strength/Tone: WFL for tasks assessed Right Lower Extremity Assessment RLE ROM/Strength/Tone: Deficits RLE ROM/Strength/Tone Deficits: generally weak grossly 4/5 RLE Sensation: WFL - Light Touch Left Lower Extremity Assessment LLE ROM/Strength/Tone: Deficits LLE ROM/Strength/Tone Deficits: generally weak grossly 4/5 LLE Sensation: WFL - Light Touch     Mobility Bed Mobility Bed Mobility: Supine to Sit Supine to Sit: 5: Supervision;HOB elevated (30 degrees) Details for Bed Mobility Assistance: increased effort, cues for efficiency of  movement Transfers Transfers: Sit to Stand;Stand to Sit Sit to Stand: 3: Mod assist;From bed;With upper extremity assist Stand to Sit: 4: Min assist;With upper extremity assist;With armrests;To chair/3-in-1 Details for Transfer Assistance: verbal cues for sequencing, facilitation for follow through and power up especially with sit->stand, stability assist during transfer and pt using RW     Shoulder Instructions     Exercise     Balance Balance Balance Assessed: Yes Static Standing Balance Static Standing - Balance Support: Bilateral upper extremity supported Static Standing - Level of Assistance: 5: Stand by assistance   End of Session OT - End of Session Equipment Utilized During Treatment: Gait belt Activity Tolerance: Patient tolerated treatment well Patient left: in chair;with call bell/phone within reach Nurse Communication: Mobility status;Other (comment) (diarrhea)  GO   02/20/2012 Cipriano Mile OTR/L Pager [phone] Office [phone]   Cipriano Mile 02/20/2012, 2:39 PM
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
## 2012-02-20 NOTE — Progress Notes (Signed)
VASCULAR LAB PRELIMINARY  PRELIMINARY  PRELIMINARY  PRELIMINARY  Bilateral lower extremity venous duplex  completed.    Preliminary report:  Bilateral:  No evidence of DVT, superficial thrombosis, or Baker's Cyst.    Perl Folmar, RVT 02/20/2012, 3:05 PM

## 2012-02-20 NOTE — Progress Notes (Signed)
Cardiology Progress Note Patient Name: [name] Date of Encounter: 02/20/2012, 7:01 AM     Subjective  Denies chest pain or shortness of breath (on 5L [place]). Complains of bilat leg "gout" pain.    Objective   Telemetry: A.Fib 60-80s  Medications: . antiseptic oral rinse  15 mL Mouth Rinse BID  . atorvastatin  10 mg Oral Daily  . carvedilol  12.5 mg Oral BID WC  . diltiazem  120 mg Oral Daily  . docusate sodium  100 mg Oral Daily  . enoxaparin (LOVENOX) injection  30 mg Subcutaneous Q24H  . feeding supplement  237 mL Oral TID WC  . ferrous sulfate  325 mg Oral BID  . furosemide  40 mg Intravenous BID  . insulin aspart  0-5 Units Subcutaneous QHS  . insulin aspart  0-9 Units Subcutaneous TID WC  . multivitamin with minerals  1 tablet Oral Daily  . pantoprazole  40 mg Oral Q0600  . piperacillin-tazobactam (ZOSYN)  IV  3.375 g Intravenous Q8H  . sodium chloride  3 mL Intravenous Q12H  . vancomycin  750 mg Intravenous Q24H   . sodium chloride 20 mL/hr at 02/19/12 1800    Physical Exam: Temp:  [98.3 F (36.8 C)-99.1 F (37.3 C)] 99.1 F (37.3 C) (01/08 0400) Pulse Rate:  [63-94] 68  (01/08 0600) Resp:  [16-34] 20  (01/08 0600) BP: (110-135)/(45-65) 114/55 mmHg (01/08 0311) SpO2:  [91 %-99 %] 99 % (01/08 0600) Weight:  [171 lb 1.2 oz (77.6 kg)] 171 lb 1.2 oz (77.6 kg) (01/08 0600)  General: Chronically ill appearing elderly female, in no acute distress. Head: Normocephalic, atraumatic, sclera non-icteric, nares are without discharge.  Neck: Supple. Negative for carotid bruits. JVP 14 cm Lungs: Bibasilar rales. No wheezes or rhonchi. Breathing is unlabored on 5L O2. Heart: RRR S1 S2 without murmurs, rubs, or gallops.  Abdomen: Soft, non-tender, non-distended with normoactive bowel sounds. No rebound/guarding. No obvious abdominal masses. Msk:  Strength and tone appear normal for age. Extremities: Trace BLE edema. Chronic skin discoloration changes to BLE L>R. BLE  tender to touch. Distal pedal pulses are intact and equal bilaterally. Neuro: Alert and oriented X 3. Moves all extremities spontaneously. Psych:  Responds to questions appropriately with a normal affect.   Intake/Output Summary (Last 24 hours) at 02/20/12 0701 Last data filed at 02/20/12 0600  Gross per 24 hour  Intake    600 ml  Output    800 ml  Net   -200 ml    Labs:  [facility] 02/19/12 1756 02/19/12 0149  NA 138 144  K 3.3* 3.2*  CL 97 103  CO2 28 29  GLUCOSE 239* 137*  BUN 35* 30*  CREATININE 1.96* 1.76*  CALCIUM 8.9 9.2  MG 1.5 --  PHOS -- --   Basename 02/18/12 1629  AST 16  ALT 11  ALKPHOS 111  BILITOT 0.6  PROT 7.4  ALBUMIN 2.9*    Basename 02/19/12 0149 02/18/12 1629  WBC 13.3* 14.8*  NEUTROABS -- 12.0*  HGB 7.9* 8.4*  HCT 25.3* 28.8*  MCV 91.0 93.2  PLT 156 279   Basename 02/19/12 0840 02/19/12 0149 02/18/12 2037  TROPONINI <0.30 <0.30 <0.30   Basename 02/19/12 0149  CHOL 124  HDL 36*  LDLCALC 71  TRIG 87  CHOLHDL 3.4   Basename 02/19/12 0149  VITAMINB12 840  FOLATE 7.5  FERRITIN 798*  TIBC Not calculated due to Iron <10.  IRON <10*  RETICCTPCT 1.8    Radiology/Studies:   02/19/2012 - CHEST - LEFT DECUBITUS   Findings: In left decubitus positioning, and there is layering partially of pleural effusion along the dependent thorax.  With fluid immobilization, there remains evidence of opacity in the retrocardiac left lower lobe suggesting airspace infiltrate.  IMPRESSION: At least partially free-flowing small pleural effusion.  Left lower lobe infiltrate also identified.   02/19/2012 -  PORTABLE CHEST - 1 VIEW   Findings: Similar appearance of the dense left basilar opacity likely left pleural effusion.  Unchanged cardiomegaly with left atrial enlargement.  Probable enlargement of the main pulmonary artery.  Slightly increased pulmonary vascular congestion compared to yesterday.  Right subclavian approach cardiac rhythm maintenance device again  noted.  No acute osseous abnormality.  IMPRESSION:  1.  Slightly increased pulmonary vascular congestion. 2.  Similar appearance of dense left basilar opacity with pleural effusion. 3.  Stable cardiomegaly with left atrial and likely pulmonary arterial enlargement     02/18/2012  - PORTABLE CHEST - 2 VIEW   Findings: Worsening aeration left lung base.  This may represent atelectasis, infiltrate and / or pleural effusion.  Cardiomegaly with sequential pacemaker in place.  Pulmonary vascular prominence most notable centrally.  Small right-sided pleural effusion.  IMPRESSION: Interval worsening in the degree of aeration left lung base may represent pleural fluid with atelectasis and / or infiltrate. Recommend follow-up until clearance.  Cardiomegaly with central pulmonary vascular prominence and small right-sided pleural effusion raising possibility of mild congestive heart failure.  Calcified tortuous aorta.     02/18/2012 - PORTABLE CHEST - 1 VIEW  Findings: Sequential pacemaker enters from the right.  Leads unchanged in position.  Cardiomegaly.  Pulmonary vascular congestion superimposed on chronic changes.  Retrocardiac opacity may represent crowding of vessels or atelectasis however, infiltrate not excluded.  No gross pneumothorax.  Calcified mildly tortuous aorta.  The patient would eventually benefit from follow-up two-view chest with cardiac leads removed.  IMPRESSION: Cardiomegaly.  Pulmonary vascular congestion superimposed on chronic changes.  Retrocardiac opacity may represent crowding of vessels or atelectasis however, infiltrate not excluded.        Assessment and Plan  79 y.o. female w/ PMHx significant for diastolic HF in setting of hypertrophic cardiomyopathy, chronic afib with sick sinus syndrome s/p [employer] PPM (not an anticoag candidate), CKD Stage IV (Cr baseline 2.1-2.3), nonobsturctive CAD (per cath 2007), Chronic Respiratory Failure (chronic O2 @ 2L), CVA, HTN, DM, HLD and depression who  presented to [facility]/18[facility]12 with shortness of breath, volume overload, ? Aspiration, and wide complex tachycardia.  1. Acute on chronic respiratory failure: Multifactoral in the setting of pneumonia (?aspiration) and acute on chronic diastolic heart failure. No longer requiring BiPAP, still on 5L [place] (on 2L chronically) 2. HCAP with aspiration: Low grade temp. On vanc and zosyn per primary team 3. Acute on Chronic diastolic heart failure: Still with volume on exam. Poor diuresis. I/Os only (-) on 40mg  Lasix IV BID. Weight up 4lbs. Crt trending up. Further diuresis per MD recs. Echo pending 4. Chronic renal failure: Baseline Crt 2.1-2.3. Crt 1.76-->1.96 -->?today with diuresis. Cont to monitor 5. Hypokalemia: K+ supplemented yesterday. BMET and Mg pending this morning. Cont to supplement as needed.  6. WCT, resolved with amiodarone, reassess EF on echo today. 7. Chronic atrial fibrillation/SSS s/p PPM: PPM interrogated and noted to be functioning properly. Rate controlled on oral diltiazem. Not felt to be coumadin candidate 8. Chronic Iron Deficiency Anemia: Iron <10.  On Iron supplementation. Hgb 8.4 --> 7.9 --> ?today. Guaiac stool.  9. Diabetes mellitus, Type 2, uncontrolled: Management per primary team 10. Depression and unstable living environment: consult social worker for depression screen, medication options (Delivery?) and probable SNF placement.   Darrol Jump, JESSICA PA-C 02/20/2012 7:35 AM  Patient seen with PA, agree with the above note.  1. Atrial fibrillation: Chronic, rate is controlled on Coreg and diltiazem CD.  She is no longer on warfarin due to falls and recent GI bleed.  Will have her start ASA 81.  2. Acute on chronic diastolic CHF: She is quite volume overloaded on exam.  She is diuresing poorly on a relatively low dose of IV Lasix.  She had been on torsemide 100 mg daily at home and was stable.  However, apparently her dose was decreased to 20 mg daily  after last hospitalization for GI bleed, which is probably inadequate for her.   Creatinine is around 1.9 which is below what has been her chronic baseline.  I will give her a 40 mg IV Lasix bolus and start gtt at 10 mg/hr.  Will need to follow creatinine closely.  She will need an echo this admission.  3. PNA: Possible component of aspiration.  Febrile at admission, now afebrile.  Antibiotics per primary team. 4. Proteus UTI: Abx per primary team.  5. WCT: ? afib with aberrancy.  Continue Coreg.  Follow telemetry.  6. Very difficult home situation for years.  I recommended to her that she go to SNF.  She is not ready for that.  I will have the social worker talk to her.   [name] 02/20/2012 8:15 AM

## 2012-02-20 NOTE — Evaluation (Signed)
Physical Therapy Evaluation Patient Details Name: KELSYE LOOMER MRN: [id number] DOB: [birth year] Today's Date: 02/20/2012 Time: 1125-1203 PT Time Calculation (min): 38 min  PT Assessment / Plan / Recommendation Clinical Impression  Mrs. Geeting is 79 y/o female w/ PMH of dCHF, DM2, HTN, CAD presenting with dyspnea after aspirating some milk, progressive lower extremity edema, and wide complex tachycardia. Presents with below impairments and will benefit physical therapy in the acute setting to maximize strength and independence for safe d/c home. Ideally I would recommend ST-SNF however pt adamantly against SNF placement at this time. She is very concerned about getting bills paid. Educated pt on our recommendations for 24 hour supervision/assist and she seems to think her daughter could come stay with her.     PT Assessment  Patient needs continued PT services    Follow Up Recommendations  Home health PT;Supervision/Assistance - 24 hour    Does the patient have the potential to tolerate intense rehabilitation      Barriers to Discharge        Equipment Recommendations  None recommended by PT    Recommendations for Other Services     Frequency Min 3X/week    Precautions / Restrictions Precautions Precautions: Fall   Pertinent Vitals/Pain On 5-6 liters throughout session with SaO2 98%      Mobility  Bed Mobility Bed Mobility: Supine to Sit Supine to Sit: 5: Supervision;HOB elevated (30 degrees) Details for Bed Mobility Assistance: increased effort, cues for efficiency of movement Transfers Transfers: Sit to Stand;Stand to Sit;Stand Pivot Transfers (sit<>stand x2 ) Sit to Stand: 3: Mod assist;From bed;With upper extremity assist Stand to Sit: 4: Min assist;With upper extremity assist;With armrests;To chair/3-in-1 Stand Pivot Transfers: 1: +2 Total assist (bed->3in1) Stand Pivot Transfers: Patient Percentage: 70% Details for Transfer Assistance: verbal cues for sequencing,  facilitation for follow through and power up especially with sit->stand, stability assist during transfer and pt using RW Ambulation/Gait Ambulation/Gait Assistance: Not tested (comment) (pt incontinent with loose stool)              PT Diagnosis: Difficulty walking;Abnormality of gait;Generalized weakness  PT Problem List: Decreased strength;Decreased activity tolerance;Decreased balance;Decreased mobility;Obesity PT Treatment Interventions: DME instruction;Gait training;Stair training;Functional mobility training;Therapeutic activities;Therapeutic exercise;Balance training;Neuromuscular re-education;Patient/family education   PT Goals Acute Rehab PT Goals PT Goal Formulation: With patient Time For Goal Achievement: 02/27/12 Potential to Achieve Goals: Good Pt will go Supine/Side to Sit: with modified independence PT Goal: Supine/Side to Sit - Progress: Goal set today Pt will go Sit to Supine/Side: with modified independence PT Goal: Sit to Supine/Side - Progress: Goal set today Pt will go Sit to Stand: with modified independence PT Goal: Sit to Stand - Progress: Goal set today Pt will go Stand to Sit: with modified independence PT Goal: Stand to Sit - Progress: Goal set today Pt will Transfer Bed to Chair/Chair to Bed: with modified independence PT Transfer Goal: Bed to Chair/Chair to Bed - Progress: Goal set today Pt will Ambulate: 51 - 150 feet;with modified independence PT Goal: Ambulate - Progress: Goal set today Pt will Perform Home Exercise Program: Independently PT Goal: Perform Home Exercise Program - Progress: Goal set today  Visit Information  Last PT Received On: 02/20/12 Assistance Needed: +1 (+2 for safety) PT/OT Co-Evaluation/Treatment: Yes    Subjective Data  Subjective: You can just call me Drippy Lucendia Herrlich.  Patient Stated Goal: home, to get her bills together and to go to rehab after that   Prior Functioning  Home Living Lives  With: Spouse Available Help at  Discharge: Family;Available PRN/intermittently Type of Home: Mobile home Home Access: Stairs to enter Entrance Stairs-Number of Steps: 3 Entrance Stairs-Rails: Right;Left Home Layout: One level Bathroom Toilet: Handicapped height Bathroom Accessibility: Yes How Accessible: Accessible via walker Home Adaptive Equipment: Wheelchair - manual;Walker - rolling;Straight cane Prior Function Level of Independence: Needs assistance Needs Assistance: Meal Prep;Light Housekeeping Meal Prep: Moderate Light Housekeeping: Maximal Driving: No Comments: Family assists her with grocery shopping and household management Communication Communication: No difficulties    Cognition  Overall Cognitive Status: Impaired Area of Impairment: Safety/judgement Arousal/Alertness: Awake/alert Orientation Level: Appears intact for tasks assessed Behavior During Session: [facility] for tasks performed Safety/Judgement: Decreased awareness of need for assistance Safety/Judgement - Other Comments: adamant about going home despite difficulties moving around today reporting she will be able to do it when she gets home    Extremity/Trunk Assessment Right Upper Extremity Assessment RUE ROM/Strength/Tone: [facility] for tasks assessed Left Upper Extremity Assessment LUE ROM/Strength/Tone: WFL for tasks assessed Right Lower Extremity Assessment RLE ROM/Strength/Tone: Deficits RLE ROM/Strength/Tone Deficits: generally weak grossly 4/5 RLE Sensation: WFL - Light Touch Left Lower Extremity Assessment LLE ROM/Strength/Tone: Deficits LLE ROM/Strength/Tone Deficits: generally weak grossly 4/5 LLE Sensation: WFL - Light Touch   Balance Balance Balance Assessed: Yes Static Standing Balance Static Standing - Balance Support: Bilateral upper extremity supported Static Standing - Level of Assistance: 5: Stand by assistance  End of Session PT - End of Session Equipment Utilized During Treatment: Gait belt Activity Tolerance: Treatment  limited secondary to medical complications (Comment) (diarrhea) Patient left: in chair;with call bell/phone within reach Nurse Communication: Mobility status;Other (comment) (diarrhea)  GP     WHITLOW,Stormy Sabol [name] 02/20/2012, 1:35 PM

## 2012-02-20 NOTE — Care Management Note (Addendum)
    Page 1 of 2   03/04/2012     2:53:02 PM   CARE MANAGEMENT NOTE 03/04/2012  Patient:  [name], [name]   Account Number:  [id number]  Date Initiated:  02/20/2012  Documentation initiated by:  MAYO,HENRIETTA  Subjective/Objective Assessment:   79 yr-old female adm with diastolic HF; lives with spouse, has home O2 with [employer].     Action/Plan:   Anticipated DC Date:  03/04/2012   Anticipated DC Plan:  SKILLED NURSING FACILITY  In-house referral  Clinical Social Worker      DC Associate Professor  CM consult      [facility] Choice  HOME HEALTH   Choice offered to / List presented to:  C-1 Patient        HH arranged  HH-1 RN  HH-10 DISEASE MANAGEMENT      HH agency  [facility]   Status of service:  Completed, signed off Medicare Important Message given?   (If response is "NO", the following Medicare IM given date fields will be blank) Date Medicare IM given:   Date Additional Medicare IM given:    Discharge Disposition:  SKILLED NURSING FACILITY  Per UR Regulation:  Reviewed for med. necessity/level of care/duration of stay  If discussed at Long Length of Stay Meetings, dates discussed:   02/28/2012    Comments:  PCP: Dr Doralee [facility]  03/04/12- 1000- Donn Pierini RN, BSN - [phone] Pt for d/c today to SNF  1/15 1216 debbie dowell rn,bsn spoke w pt. her husband in nsg facility and pt feels she needs nsg facility since lives alone and no assist. sw ref made.  02/20/12 0845 Henrietta Mayo RN BSN MSN CCM Met with pt and discussed current home situation.  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 She has declined home health services previously because spouse would not allow RN to visit.  Spouse is currently hospitalized and pt states the plan is for SNF placement as she is unable to continue to care for him.  Pt states that adult children visit her at least twice weekly and have agreed to pick up her medications  from the pharmacy as needed.  Discussed home health RN for CHF education/management and pt agrees; provided agency list and referral made per choice.  Alerted spouse's CM of pt's expressed inability to continue to care for him.
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
## 2012-02-20 NOTE — Clinical Social Work Note (Signed)
Clinical Social Worker received multiple referrals. CSW reviewed chart and staffed case with Case Management. CM discussed with patient her home situation and patient's husband is planned for SNF placement because patient reported that she cannot continue to care for her husband, who has alzheimer's dementia . Patient is planned for home health services at discharge, per discussion with case management. CSW will sign for now, as social work intervention is no longer needed.   Rozetta Nunnery MSW, [employer] [phone]

## 2012-02-21 ENCOUNTER — Inpatient Hospital Stay (HOSPITAL_COMMUNITY): Payer: Medicare Other

## 2012-02-21 ENCOUNTER — Encounter (HOSPITAL_COMMUNITY)
Admission: EM | Disposition: A | Discharge status: Skilled Nursing Facility | Payer: Self-pay | Source: Home / Self Care | Attending: Family Medicine

## 2012-02-21 DIAGNOSIS — I509 Heart failure, unspecified: Secondary | ICD-10-CM

## 2012-02-21 HISTORY — PX: RIGHT HEART CATHETERIZATION: SHX5447

## 2012-02-21 LAB — BASIC METABOLIC PANEL
Calcium: 9.3 mg/dL (ref 8.4–10.5)
Calcium: 9.3 mg/dL (ref 8.4–10.5)
Creatinine, Ser: 1.99 mg/dL — ABNORMAL HIGH (ref 0.50–1.10)
GFR calc Af Amer: 21 mL/min — ABNORMAL LOW (ref >90)
GFR calc Af Amer: 26 mL/min — ABNORMAL LOW (ref >90)
GFR calc non Af Amer: 18 mL/min — ABNORMAL LOW (ref >90)
Glucose, Bld: 126 mg/dL — ABNORMAL HIGH (ref 70–99)
Potassium: 4.4 mEq/L (ref 3.5–5.1)
Sodium: 134 mEq/L — ABNORMAL LOW (ref 135–145)
Sodium: 136 mEq/L (ref 135–145)

## 2012-02-21 LAB — CBC
MCH: 28.4 pg (ref 26.0–34.0)
MCHC: 30.7 g/dL (ref 30.0–36.0)
Platelets: 157 10*3/uL (ref 150–400)
RDW: 16 % — ABNORMAL HIGH (ref 11.5–15.5)
RDW: 15.8 % — ABNORMAL HIGH (ref 11.5–15.5)
WBC: 8.6 10*3/uL (ref 4.0–10.5)

## 2012-02-21 LAB — POCT I-STAT 3, VENOUS BLOOD GAS (G3P V)
Acid-Base Excess: 5 mmol/L — ABNORMAL HIGH (ref 0.0–2.0)
Acid-Base Excess: 5 mmol/L — ABNORMAL HIGH (ref 0.0–2.0)
O2 Saturation: 32 %
pO2, Ven: 20 mmHg — CL (ref 30.0–45.0)
pO2, Ven: 20 mmHg — CL (ref 30.0–45.0)

## 2012-02-21 LAB — POCT I-STAT 3, ART BLOOD GAS (G3+)
Acid-Base Excess: 6 mmol/L — ABNORMAL HIGH (ref 0.0–2.0)
Bicarbonate: 30.7 mEq/L — ABNORMAL HIGH (ref 20.0–24.0)

## 2012-02-21 LAB — GLUCOSE, CAPILLARY
Glucose-Capillary: 100 mg/dL — ABNORMAL HIGH (ref 70–99)
Glucose-Capillary: 186 mg/dL — ABNORMAL HIGH (ref 70–99)

## 2012-02-21 LAB — CARBOXYHEMOGLOBIN
Carboxyhemoglobin: 1.9 % — ABNORMAL HIGH (ref 0.5–1.5)
O2 Saturation: 72.8 %
Total hemoglobin: 9.2 g/dL — ABNORMAL LOW (ref 12.0–16.0)

## 2012-02-21 LAB — VANCOMYCIN, TROUGH: Vancomycin Tr: 25.2 ug/mL (ref 10.0–20.0)

## 2012-02-21 LAB — PREPARE RBC (CROSSMATCH)

## 2012-02-21 SURGERY — RIGHT HEART CATH
Anesthesia: Moderate Sedation

## 2012-02-21 SURGERY — RIGHT HEART CATH
Anesthesia: LOCAL

## 2012-02-21 MED ORDER — SODIUM CHLORIDE 0.9 % IV SOLN
250.0000 mL | INTRAVENOUS | Status: DC | PRN
  Administered 2012-02-22: 30 mL/h via INTRAVENOUS
  Administered 2012-02-22: 1000 mL via INTRAVENOUS

## 2012-02-21 MED ORDER — SODIUM CHLORIDE 0.9 % IJ SOLN
3.0000 mL | Freq: Two times a day (BID) | INTRAMUSCULAR | Status: DC
  Administered 2012-02-26 – 2012-02-27 (×2): 3 mL via INTRAVENOUS
  Administered 2012-02-28: 10 mL via INTRAVENOUS
  Administered 2012-02-29 – 2012-03-03 (×8): 3 mL via INTRAVENOUS

## 2012-02-21 MED ORDER — ACETAMINOPHEN 325 MG PO TABS
650.0000 mg | ORAL_TABLET | ORAL | Status: DC | PRN
  Administered 2012-02-22 – 2012-02-24 (×4): 650 mg via ORAL
  Filled 2012-02-21 (×5): qty 2

## 2012-02-21 MED ORDER — SODIUM CHLORIDE 0.9 % IJ SOLN
3.0000 mL | INTRAMUSCULAR | Status: DC | PRN
  Administered 2012-02-22 (×2): 3 mL via INTRAVENOUS

## 2012-02-21 MED ORDER — ASPIRIN 81 MG PO CHEW
324.0000 mg | CHEWABLE_TABLET | ORAL | Status: AC
  Administered 2012-02-21: 324 mg via ORAL
  Filled 2012-02-21: qty 4

## 2012-02-21 MED ORDER — SODIUM CHLORIDE 0.9 % IJ SOLN: 3.0000 mL | INTRAMUSCULAR | Status: DC | PRN

## 2012-02-21 MED ORDER — FUROSEMIDE 10 MG/ML IJ SOLN
40.0000 mg | Freq: Once | INTRAMUSCULAR | Status: AC
  Filled 2012-02-21: qty 4

## 2012-02-21 MED ORDER — HEPARIN (PORCINE) IN NACL 2-0.9 UNIT/ML-% IJ SOLN
INTRAMUSCULAR | Status: AC
  Filled 2012-02-21: qty 1000

## 2012-02-21 MED ORDER — SODIUM CHLORIDE 0.9 % IJ SOLN: 3.0000 mL | Freq: Two times a day (BID) | INTRAMUSCULAR | Status: DC

## 2012-02-21 MED ORDER — DOPAMINE-DEXTROSE 3.2-5 MG/ML-% IV SOLN
2.0000 ug/kg/min | INTRAVENOUS | Status: DC
  Administered 2012-02-21: 2 ug/kg/min via INTRAVENOUS
  Filled 2012-02-21: qty 250

## 2012-02-21 MED ORDER — SODIUM CHLORIDE 0.9 % IV SOLN: 250.0000 mL | INTRAVENOUS | Status: DC | PRN

## 2012-02-21 MED ORDER — ONDANSETRON HCL 4 MG/2ML IJ SOLN
4.0000 mg | Freq: Four times a day (QID) | INTRAMUSCULAR | Status: DC | PRN
  Administered 2012-02-28: 4 mg via INTRAVENOUS
  Filled 2012-02-21: qty 2

## 2012-02-21 MED ORDER — ENOXAPARIN SODIUM 30 MG/0.3ML ~~LOC~~ SOLN
30.0000 mg | SUBCUTANEOUS | Status: DC
  Administered 2012-02-22 – 2012-02-26 (×4): 30 mg via SUBCUTANEOUS
  Filled 2012-02-21 (×6): qty 0.3

## 2012-02-21 MED ORDER — LIDOCAINE HCL (PF) 1 % IJ SOLN
INTRAMUSCULAR | Status: AC
  Filled 2012-02-21: qty 30

## 2012-02-21 MED ORDER — DOPAMINE-DEXTROSE 3.2-5 MG/ML-% IV SOLN
4.0000 ug/kg/min | INTRAVENOUS | Status: DC
  Administered 2012-02-21: 5 ug/kg/min via INTRAVENOUS
  Administered 2012-02-24 – 2012-02-25 (×2): 4 ug/kg/min via INTRAVENOUS
  Filled 2012-02-21 (×3): qty 250

## 2012-02-21 MED ORDER — FUROSEMIDE 10 MG/ML IJ SOLN
15.0000 mg/h | INTRAVENOUS | Status: DC
  Administered 2012-02-21 – 2012-02-22 (×2): 10 mg/h via INTRAVENOUS
  Administered 2012-02-23 – 2012-02-26 (×6): 15 mg/h via INTRAVENOUS
  Filled 2012-02-21 (×19): qty 25

## 2012-02-21 MED ORDER — VANCOMYCIN HCL 1000 MG IV SOLR
750.0000 mg | INTRAVENOUS | Status: DC
  Administered 2012-02-22 – 2012-02-23 (×2): 750 mg via INTRAVENOUS
  Filled 2012-02-21 (×3): qty 750

## 2012-02-21 NOTE — Progress Notes (Signed)
02/21/2012 12:36 PM To cath lab via bed. Maude Hettich, Linnell Fulling
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
## 2012-02-21 NOTE — Progress Notes (Signed)
PT Cancellation Note  Patient Details Name: [name] MRN: [id number] DOB: 1930-09-18   Cancelled Treatment:    Reason Eval/Treat Not Completed: Per RN request will hold PT this morning so she can prep for heat cath. Will f/u tomorrow.   [facility] [name] 02/21/2012, 9:45 AM Pager: [phone]
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
## 2012-02-21 NOTE — Interval H&P Note (Signed)
History and Physical Interval Note:  02/21/2012 1:06 PM  [name]  has presented today for surgery, with the diagnosis of CHF  The various methods of treatment have been discussed with the patient and family. After consideration of risks, benefits and other options for treatment, the patient has consented to  Procedure(s) (LRB) with comments: RIGHT HEART CATH (N/A) as a surgical intervention .  The patient's history has been reviewed, patient examined, no change in status, stable for surgery.  I have reviewed the patient's chart and labs.  Questions were answered to the patient's satisfaction.     Twana Wileman

## 2012-02-21 NOTE — Progress Notes (Signed)
UR completed 

## 2012-02-21 NOTE — CV Procedure (Addendum)
Cardiac Cath Procedure Note:  Indication:  HF and cardiorenal syndrome  Procedures performed:  1) Right heart catheterization  Description of procedure:   The risks and indication of the procedure were explained. Consent was signed and placed on the chart. An appropriate timeout was taken prior to the procedure. The right neck was prepped and draped in the routine sterile fashion and anesthetized with 1% local lidocaine.   A 7 FR venous sheath was placed in the right internal jugular vein using a modified Seldinger technique. A standard Swan-Ganz catheter was used for the procedure.   Complications: None apparent.  Findings:  Done on dopamine 58mcg/kg/min  RA = 15 RV = 52/12/15 PA = 54/27 (37) PCW = 20 Fick cardiac output/index = 4.5/2.4 Thermo CO/CI =  3.2/1.7 PVR = 5.4 Woods (Thermo) PA sat = 30%, 32% FA sat = 89% on 4L O2  Assessment & Plan:  Hemodynamics most consistent with RV failure (and mild pHTN). Given low PA sat suspect Thermodilution CO more accurate than Fick calculation. Increase dopamine to 5 mcg/kg/min (run through [place]). Resume lasix gtt at 10 mg/hr. Follow renal function closely.   [name] 1:40 PM

## 2012-02-21 NOTE — Progress Notes (Signed)
FMTS Attending Note Patient case discussed with resident team, and I agree with Dr Patsey Berthold assessment and plan for today.  The patient was seen and examined by Dr [name] today. Paula Compton, MD

## 2012-02-21 NOTE — Progress Notes (Signed)
ANTIBIOTIC CONSULT NOTE - FOLLOW UP  Pharmacy Consult for Vancomycin Indication: HCAP vs aspiration PNA  Allergies  Allergen Reactions  . Paroxetine Other (See Comments)    hallucinations and falls  . Codeine Phosphate Rash  . Hydrocodone Itching and Nausea Only    REACTION: nausea and pruritis  . Tramadol Hcl Hives and Other (See Comments)    Doesn't remember     Patient Measurements: Height: 5\' 5"  (165.1 cm) Weight: 175 lb 4.3 oz (79.5 kg) IBW/kg (Calculated) : 57   Vital Signs: Temp: 98.6 F (37 C) (01/09 2025) Temp src: Oral (01/09 1225) BP: 132/70 mmHg (01/09 2000) Pulse Rate: 59  (01/09 2025) Intake/Output from previous day: 01/08 0701 - 01/09 0700 In: 1951.5 [P.O.:1050; I.V.:660; IV Piggyback:241.5] Out: 940 [Urine:940] Intake/Output from this shift: Total I/O In: 53.5 [I.V.:53.5] Out: -   Labs:  Basename 02/21/12 2000 02/21/12 0430 02/20/12 2123 02/20/12 0914  WBC 8.6 6.8 -- 7.5  HGB 8.5* 7.1* -- 7.6*  PLT 157 142* -- 146*  LABCREA -- -- -- --  CREATININE 1.99* 2.41* 2.37* --   Estimated Creatinine Clearance: 23.9 ml/min (by C-G formula based on Cr of 1.99).  Basename 02/21/12 2000  VANCOTROUGH 25.2*  VANCOPEAK --  Drue Dun --  GENTTROUGH --  GENTPEAK --  GENTRANDOM --  TOBRATROUGH --  TOBRAPEAK --  TOBRARND --  AMIKACINPEAK --  AMIKACINTROU --  AMIKACIN --    Assessment: 81yof continues on vancomycin day#4 for HCAP vs aspiration PNA. Vancomycin trough is above goal on 750mg  q24. However, renal function is improving on dopamine (sCr 2.4-->1.99) and UOP is picking up on lasix gtt so hesitant to decrease dose.   1/6 vanc>> 1/6 zosyn>> 1/6 urine>>Proteus (resis- cipro, bactrim, nitro, levaquin, sens - amp, ancef, rocephin, zosyn) 1/6 MRSA screen neg  Goal of Therapy:  Vancomycin trough level 15-20 mcg/ml  Plan:  1) Continue vancomycin 750mg  IV q24 but hold next dose until 0500 tomorrow 2) Continue to follow renal function, UOP,  LOT  Fredrik Rigger 02/21/2012,9:08 PM

## 2012-02-22 ENCOUNTER — Inpatient Hospital Stay (HOSPITAL_COMMUNITY): Payer: Medicare Other

## 2012-02-22 LAB — GLUCOSE, CAPILLARY
Glucose-Capillary: 218 mg/dL — ABNORMAL HIGH (ref 70–99)
Glucose-Capillary: 231 mg/dL — ABNORMAL HIGH (ref 70–99)

## 2012-02-22 LAB — BASIC METABOLIC PANEL
CO2: 32 mEq/L (ref 19–32)
Chloride: 96 mEq/L (ref 96–112)
Potassium: 3.4 mEq/L — ABNORMAL LOW (ref 3.5–5.1)
Sodium: 139 mEq/L (ref 135–145)

## 2012-02-22 LAB — CARBOXYHEMOGLOBIN
Carboxyhemoglobin: 2 % — ABNORMAL HIGH (ref 0.5–1.5)
Methemoglobin: 1.3 % (ref 0.0–1.5)
O2 Saturation: 71.7 %
Total hemoglobin: 9.1 g/dL — ABNORMAL LOW (ref 12.0–16.0)

## 2012-02-22 LAB — CBC
MCV: 90.7 fL (ref 78.0–100.0)
Platelets: 172 10*3/uL (ref 150–400)
RBC: 3.13 MIL/uL — ABNORMAL LOW (ref 3.87–5.11)
WBC: 9.1 10*3/uL (ref 4.0–10.5)

## 2012-02-22 LAB — TYPE AND SCREEN
Antibody Screen: NEGATIVE
Unit division: 0

## 2012-02-22 MED ORDER — POTASSIUM CHLORIDE CRYS ER 20 MEQ PO TBCR
40.0000 meq | EXTENDED_RELEASE_TABLET | Freq: Once | ORAL | Status: AC
  Administered 2012-02-22: 40 meq via ORAL

## 2012-02-22 MED ORDER — POTASSIUM CHLORIDE 20 MEQ PO PACK: 40.0000 meq | PACK | Freq: Once | ORAL | Status: DC

## 2012-02-22 MED ORDER — ASPIRIN 81 MG PO CHEW
CHEWABLE_TABLET | ORAL | Status: AC
  Filled 2012-02-22: qty 1

## 2012-02-22 MED ORDER — POTASSIUM CHLORIDE CRYS ER 20 MEQ PO TBCR
EXTENDED_RELEASE_TABLET | ORAL | Status: AC
  Filled 2012-02-22: qty 2

## 2012-02-22 MED ORDER — DOPAMINE-DEXTROSE 3.2-5 MG/ML-% IV SOLN
INTRAVENOUS | Status: AC
  Administered 2012-02-22: 4 ug
  Filled 2012-02-22: qty 250
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
## 2012-02-22 NOTE — Progress Notes (Signed)
FMTS Attending Note Patient seen and examined by me today, discussed with resident team and I agree with Dr Bradshaw's note for today.  Sherryll Skoczylas, MD 

## 2012-02-22 NOTE — Progress Notes (Signed)
Attempt PICC placement left arm - has right Swann cordis and right-sided pacemaker. Unable to get PICC into correct position into SVC - appears to cross midline on ultrasound. Spoke with Interventional radiology to reposition or place new PICC. RN aware

## 2012-02-22 NOTE — Progress Notes (Signed)
PT/OT Cancellation Note  Patient Details Name: [name] MRN: [id number] DOB: [birth year]   Cancelled Treatment:    Reason Eval/Treat Not Completed: Other (comment) (pt now has [name] but about to leave to get PICC line placed per RN report.)  02/22/2012 Cipriano Mile OTR/L Pager [phone] Office [phone]  Ivonne Andrew PT, DPT Pager: [phone]
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
## 2012-02-22 NOTE — Procedures (Signed)
PICC line needed repositioning.  Fluoro suggested that the PICC was short and possibly in azygous vein.  The PICC was exchanged.  New triple lumen PICC is 37 cm in length and tip in lower SVC.

## 2012-02-23 LAB — CBC
Hemoglobin: 8.6 g/dL — ABNORMAL LOW (ref 12.0–15.0)
MCH: 28.3 pg (ref 26.0–34.0)
MCV: 91.8 fL (ref 78.0–100.0)
RBC: 3.04 MIL/uL — ABNORMAL LOW (ref 3.87–5.11)
WBC: 9.1 10*3/uL (ref 4.0–10.5)

## 2012-02-23 LAB — GLUCOSE, CAPILLARY
Glucose-Capillary: 193 mg/dL — ABNORMAL HIGH (ref 70–99)
Glucose-Capillary: 183 mg/dL — ABNORMAL HIGH (ref 70–99)
Glucose-Capillary: 209 mg/dL — ABNORMAL HIGH (ref 70–99)

## 2012-02-23 LAB — BASIC METABOLIC PANEL
CO2: 33 mEq/L — ABNORMAL HIGH (ref 19–32)
Calcium: 9.3 mg/dL (ref 8.4–10.5)
Chloride: 93 mEq/L — ABNORMAL LOW (ref 96–112)
Creatinine, Ser: 0.86 mg/dL (ref 0.50–1.10)
Glucose, Bld: 176 mg/dL — ABNORMAL HIGH (ref 70–99)

## 2012-02-23 LAB — CARBOXYHEMOGLOBIN
Methemoglobin: 1.1 % (ref 0.0–1.5)
O2 Saturation: 58.3 %

## 2012-02-23 MED ORDER — POTASSIUM CHLORIDE 20 MEQ PO PACK
40.0000 meq | PACK | Freq: Once | ORAL | Status: DC
  Filled 2012-02-23: qty 2

## 2012-02-23 MED ORDER — POTASSIUM CHLORIDE CRYS ER 20 MEQ PO TBCR
40.0000 meq | EXTENDED_RELEASE_TABLET | Freq: Once | ORAL | Status: DC
  Filled 2012-02-23 (×3): qty 2

## 2012-02-23 MED ORDER — POTASSIUM CHLORIDE CRYS ER 20 MEQ PO TBCR
20.0000 meq | EXTENDED_RELEASE_TABLET | Freq: Once | ORAL | Status: DC
Start: 2012-02-23 — End: 2012-02-23
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
## 2012-02-23 NOTE — Progress Notes (Signed)
FMTS Attending note Patient seen and examined by me, discussed with resident team and I agree with Dr Felipa Emory assess/plan, including plan for transitioning to oral treatment for her HCAP. Of note, she reports continued pain in both legs, which she says is worse.  Ambulates with walker at home, reports only Tylenol for the leg pain.  While we do not have a clear etiology for the pain, I would be interested in considering trial of gabapentin or SNRI to see if this is helpful.  (Would need to sort out the reaction with Paroxetine, which is listed on her Allergy profile.) Paula Compton, MD
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
## 2012-02-23 NOTE — Plan of Care (Signed)
Problem: Phase I Progression Outcomes Goal: Up in chair, BRP Outcome: Not Met (add Reason) Pt. Declined stating that she did not sleep well during the night and wants to try to sleep today.  Also, c/o increased pain in her legs with movement. Goal: Voiding-avoid urinary catheter unless indicated Outcome: Progressing Diuresing d/t continuous lasix gtt.  Foley catheter remains for strict I and O measurement.

## 2012-02-24 LAB — GLUCOSE, CAPILLARY
Glucose-Capillary: 118 mg/dL — ABNORMAL HIGH (ref 70–99)
Glucose-Capillary: 206 mg/dL — ABNORMAL HIGH (ref 70–99)

## 2012-02-24 LAB — BASIC METABOLIC PANEL
BUN: 35 mg/dL — ABNORMAL HIGH (ref 6–23)
Calcium: 9.6 mg/dL (ref 8.4–10.5)
Creatinine, Ser: 1.74 mg/dL — ABNORMAL HIGH (ref 0.50–1.10)
GFR calc Af Amer: 31 mL/min — ABNORMAL LOW (ref >90)
GFR calc non Af Amer: 27 mL/min — ABNORMAL LOW (ref >90)
Glucose, Bld: 136 mg/dL — ABNORMAL HIGH (ref 70–99)
Potassium: 3.3 mEq/L — ABNORMAL LOW (ref 3.5–5.1)

## 2012-02-24 LAB — VANCOMYCIN, TROUGH: Vancomycin Tr: 28.5 ug/mL (ref 10.0–20.0)

## 2012-02-24 LAB — CBC
HCT: 30.3 % — ABNORMAL LOW (ref 36.0–46.0)
MCH: 28.2 pg (ref 26.0–34.0)
MCHC: 30.7 g/dL (ref 30.0–36.0)
RDW: 15.8 % — ABNORMAL HIGH (ref 11.5–15.5)

## 2012-02-24 MED ORDER — VANCOMYCIN HCL 500 MG IV SOLR: 500.0000 mg | INTRAVENOUS | Status: DC

## 2012-02-24 MED ORDER — POTASSIUM CHLORIDE CRYS ER 20 MEQ PO TBCR
40.0000 meq | EXTENDED_RELEASE_TABLET | Freq: Once | ORAL | Status: AC
  Administered 2012-02-24: 40 meq via ORAL

## 2012-02-24 MED ORDER — LEVOFLOXACIN IN D5W 750 MG/150ML IV SOLN
750.0000 mg | INTRAVENOUS | Status: DC
  Administered 2012-02-24: 750 mg via INTRAVENOUS
  Filled 2012-02-24 (×2): qty 150

## 2012-02-24 NOTE — Progress Notes (Signed)
ANTIBIOTIC CONSULT NOTE - FOLLOW UP  Pharmacy Consult for Vancomycin Indication: HCAP vs aspiration PNA  Allergies  Allergen Reactions  . Paroxetine Other (See Comments)    hallucinations and falls  . Codeine Phosphate Rash  . Hydrocodone Itching and Nausea Only    REACTION: nausea and pruritis  . Tramadol Hcl Hives and Other (See Comments)    Doesn't remember     Patient Measurements: Height: 5\' 5"  (165.1 cm) Weight: 164 lb 7.4 oz (74.6 kg) IBW/kg (Calculated) : 57   Vital Signs: Temp: 99.4 F (37.4 C) (01/12 0400) Temp src: Oral (01/12 0400) BP: 135/67 mmHg (01/12 0500) Pulse Rate: 80  (01/12 0500) Intake/Output from previous day: 01/11 0701 - 01/12 0700 In: 1811 [P.O.:720; I.V.:891; IV Piggyback:200] Out: 3600 [Urine:3600] Intake/Output from this shift: Total I/O In: 820 [P.O.:360; I.V.:410; IV Piggyback:50] Out: 2050 [Urine:2050]  Labs:  [facility] 02/24/12 0415 02/23/12 0400 02/22/12 0443  WBC 9.6 9.1 9.1  HGB 9.3* 8.6* 8.8*  PLT 169 167 172  LABCREA -- -- --  CREATININE 1.74* 0.86 2.04*   Estimated Creatinine Clearance: 26.5 ml/min (by C-G formula based on Cr of 1.74).  Basename 02/24/12 0420 02/21/12 2000  VANCOTROUGH 28.5* 25.2*  VANCOPEAK -- --  Drue Dun -- --  GENTTROUGH -- --  GENTPEAK -- --  GENTRANDOM -- --  TOBRATROUGH -- --  TOBRAPEAK -- --  TOBRARND -- --  AMIKACINPEAK -- --  AMIKACINTROU -- --  AMIKACIN -- --    Assessment: 61 yof continues on vancomycin day#6 for HCAP vs aspiration PNA. Vancomycin trough (28.5) is above goal on 750mg  q24.   Per FMTS 1/11 note, plan for 7 days of Zosyn and vancomycin then transition to levofloxacin.   1/6 vanc>> 1/6 zosyn>> 1/6 urine>>Proteus (resis- cipro, bactrim, nitro, levaquin, sens - amp, ancef, rocephin, zosyn) 1/6 MRSA screen neg  Goal of Therapy:  Vancomycin trough level 15-20 mcg/ml  Plan:  1. Decrease IV vancomycin to 500mg  IV Q24H. 2. Follow-up antibiotic plan.    [name] 02/24/2012,5:32 AM

## 2012-02-24 NOTE — Progress Notes (Signed)
Specimen sent to Respiratory Dept @ 0430 am for Carboxyhemoglobin. Results unable to be viewed in computer at this time. Verbal report of results received from [place], RT: Total Hemoglobin: 7.6 Carboxyhemoglobin: 2 Methemoglobin: 1.1 SCVO2: 68.1

## 2012-02-24 NOTE — ED Provider Notes (Signed)
I saw and evaluated the patient, reviewed the resident's note and I agree with the findings and plan.   Toy Baker, MD 02/24/12 2138
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
## 2012-02-24 NOTE — Progress Notes (Signed)
FMTS Attending Note Patient seen and examined by me today; discussed with resident team and I agree with Dr Felipa Emory note.  Ms. Rhue reports improvement in her breathing today.  Agree with change to levaquin; would change to oral dosing in order to reduce volume per IV as much as possible.  Appreciate HF Team management. Paula Compton, MD

## 2012-02-25 LAB — CBC
Hemoglobin: 9 g/dL — ABNORMAL LOW (ref 12.0–15.0)
MCHC: 30.8 g/dL (ref 30.0–36.0)
RDW: 15.7 % — ABNORMAL HIGH (ref 11.5–15.5)
WBC: 13.1 10*3/uL — ABNORMAL HIGH (ref 4.0–10.5)

## 2012-02-25 LAB — GLUCOSE, CAPILLARY
Glucose-Capillary: 247 mg/dL — ABNORMAL HIGH (ref 70–99)
Glucose-Capillary: 313 mg/dL — ABNORMAL HIGH (ref 70–99)

## 2012-02-25 LAB — CARBOXYHEMOGLOBIN
Carboxyhemoglobin: 2 % — ABNORMAL HIGH (ref 0.5–1.5)
Carboxyhemoglobin: 1.5 % (ref 0.5–1.5)
Methemoglobin: 1.1 % (ref 0.0–1.5)
O2 Saturation: 68.1 %
O2 Saturation: 47.5 %

## 2012-02-25 LAB — BASIC METABOLIC PANEL
GFR calc Af Amer: 32 mL/min — ABNORMAL LOW (ref >90)
GFR calc non Af Amer: 27 mL/min — ABNORMAL LOW (ref >90)
Potassium: 3.5 mEq/L (ref 3.5–5.1)
Sodium: 135 mEq/L (ref 135–145)

## 2012-02-25 MED ORDER — POTASSIUM CHLORIDE CRYS ER 20 MEQ PO TBCR
40.0000 meq | EXTENDED_RELEASE_TABLET | Freq: Once | ORAL | Status: AC
  Administered 2012-02-25: 40 meq via ORAL
  Filled 2012-02-25: qty 2

## 2012-02-25 MED ORDER — ACETAMINOPHEN 650 MG RE SUPP
650.0000 mg | Freq: Three times a day (TID) | RECTAL | Status: DC
  Administered 2012-02-26 (×2): 650 mg via RECTAL
  Filled 2012-02-25 (×6): qty 1

## 2012-02-25 MED ORDER — POTASSIUM CHLORIDE 20 MEQ PO PACK
40.0000 meq | PACK | Freq: Once | ORAL | Status: DC
  Filled 2012-02-25: qty 2

## 2012-02-25 NOTE — Progress Notes (Signed)
Physical Therapy Treatment Patient Details Name: [name] MRN: [id number] DOB: November 12, 1930 Today's Date: 02/25/2012 Time: 7829-5621 PT Time Calculation (min): 47 min  PT Assessment / Plan / Recommendation Comments on Treatment Session  Admitted for dyspnea after aspirating on milk. Being treated for volume overload in the setting of CHF. Has not mobilized since eval last week. Mobility has declined making transfers more difficult especially with regards to her left leg pain. Do not feel she is safe to go home given her lack of support. If she does go home she will have to go home at w/c level but even then I am concerned about transfers as she states that her daughter is also disabled and cannot help her. Continue to recommend SNF and discussed this with [name] today.     Follow Up Recommendations  SNF;Supervision/Assistance - 24 hour (if pt refused SNF rec HHPT)     Does the patient have the potential to tolerate intense rehabilitation     Barriers to Discharge        Equipment Recommendations  None recommended by PT    Recommendations for Other Services    Frequency Min 3X/week   Plan Discharge plan remains appropriate;Frequency remains appropriate    Precautions / Restrictions Precautions Precautions: Fall Restrictions Weight Bearing Restrictions: No   Pertinent Vitals/Pain Leg pain L>R making mobility difficult, was pre-medicated    Mobility  Bed Mobility Bed Mobility: Supine to Sit;Sitting - Scoot to Edge of Bed Supine to Sit: 4: Min assist Sitting - Scoot to [place] of Bed: 3: Mod assist Details for Bed Mobility Assistance: tactile and verbal sequencing cues for efficiency of movement Transfers Transfers: Sit to Stand;Stand to Sit;Stand Pivot Transfers (sit<>stand x3) Sit to Stand: 1: +2 Total assist;From bed;From chair/3-in-1;With upper extremity assist;With armrests Sit to Stand: Patient Percentage: 60% Stand to Sit: 1: +2 Total assist;With armrests;With upper  extremity assist;To chair/3-in-1 Stand to Sit: Patient Percentage: 70% Stand Pivot Transfers: 1: +2 Total assist Stand Pivot Transfers: Patient Percentage: 60% Details for Transfer Assistance: stood x3 as pt incontinent and fearful making transfers difficult so to sit on the 3in1 the bed had to be pulled from behind her and the 3in1 placed for stand->sit; pt able to pivot to recliner with increased time but needing bilateral stability assist and sequencing cues Ambulation/Gait Ambulation/Gait Assistance: Not tested (comment) (pt painful and incontinent with stool)      PT Goals Acute Rehab PT Goals PT Goal: Supine/Side to Sit - Progress: Progressing toward goal PT Goal: Sit to Stand - Progress: Progressing toward goal PT Goal: Stand to Sit - Progress: Progressing toward goal PT Transfer Goal: Bed to Chair/Chair to Bed - Progress: Progressing toward goal PT Goal: Ambulate - Progress: Not progressing  Visit Information  Last PT Received On: 02/25/12 Assistance Needed: +2    Subjective Data  Subjective: Don't touch my leg!  Patient Stated Goal: home   Cognition  Overall Cognitive Status: Impaired Area of Impairment: Safety/judgement Arousal/Alertness: Awake/alert Orientation Level: Appears intact for tasks assessed Behavior During Session: Anxious Safety/Judgement: Decreased safety judgement for tasks assessed;Decreased awareness of safety precautions;Decreased awareness of need for assistance Cognition - Other Comments: still adamant about going home, mobility was worse today but pt still not wanting to consider home even after long discussion    Balance  Static Standing Balance Static Standing - Balance Support: Bilateral upper extremity supported Static Standing - Level of Assistance: 4: Min assist Static Standing - Comment/# of Minutes: stability assist, cues  for downward force on RW and anterior translation of trunk over feet   End of Session PT - End of Session Equipment  Utilized During Treatment: Gait belt Activity Tolerance: Patient limited by pain Patient left: in chair;with call bell/phone within reach Nurse Communication: Mobility status;Need for lift equipment (possible use of stedy for transfer back to bed)   GP     [name] 02/25/2012, 9:32 AM
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
## 2012-02-25 NOTE — Progress Notes (Signed)
Occupational Therapy Treatment Patient Details Name: [name] MRN: [id number] DOB: [birth year] Today's Date: 02/25/2012 Time: 4098-1191 OT Time Calculation (min): 44 min  OT Assessment / Plan / Recommendation Comments on Treatment Session Pt more limited in mobility and with ADLs today secondary to pain in bil (L>R) LE     Follow Up Recommendations  Home health OT;SNF;Supervision/Assistance - 24 hour    Barriers to Discharge       Equipment Recommendations  3 in 1 bedside comode    Recommendations for Other Services    Frequency Min 2X/week   Plan Discharge plan remains appropriate    Precautions / Restrictions Precautions Precautions: Fall Restrictions Weight Bearing Restrictions: No   Pertinent Vitals/Pain Pt reports bil (L>R) LE pain during mobility/any movement. Pt did not rate and states she was premedicated    ADL  Grooming: Performed;Teeth care;Set up;Supervision/safety Where Assessed - Grooming: Supported sitting Lower Body Bathing: +1 Total assistance Where Assessed - Lower Body Bathing: Supported sit to stand Lower Body Dressing: +1 Total assistance (don bil socks; limited by pain) Where Assessed - Lower Body Dressing: Supported sitting Toilet Transfer: Simulated;+2 Total assistance Toilet Transfer: Patient Percentage: 60% Toilet Transfer Method: Stand pivot;Sit to Barista: Bedside commode Toileting - Clothing Manipulation and Hygiene: Performed;+1 Total assistance Where Assessed - Engineer, mining and Hygiene: Sit to stand from 3-in-1 or toilet Equipment Used: Gait belt;Rolling walker Transfers/Ambulation Related to ADLs: +2(pt=60%) sit to stand from chair/bedside and with SPT's ADL Comments: Pt experiencing diarrhea and required total assist for front/back peri care.      OT Diagnosis:    OT Problem List:   OT Treatment Interventions:     OT Goals Acute Rehab OT Goals Time For Goal Achievement: 03/05/12 ADL  Goals Pt Will Perform Grooming: with modified independence;Standing at sink ADL Goal: Grooming - Progress: Progressing toward goals Pt Will Perform Upper Body Bathing: Independently;Sitting, chair;Sitting, edge of bed Pt Will Perform Lower Body Bathing: with modified independence;Sit to stand from chair;Sit to stand from bed ADL Goal: Lower Body Bathing - Progress: Not progressing Pt Will Perform Upper Body Dressing: Independently;Sitting, chair;Sitting, bed Pt Will Perform Lower Body Dressing: with modified independence;Sit to stand from chair;Sit to stand from bed ADL Goal: Lower Body Dressing - Progress: Not progressing Pt Will Transfer to Toilet: with modified independence;Ambulation;with DME;Comfort height toilet ADL Goal: Toilet Transfer - Progress: Not progressing Pt Will Perform Toileting - Clothing Manipulation: with modified independence;Sitting on 3-in-1 or toilet;Standing ADL Goal: Toileting - Clothing Manipulation - Progress: Not progressing Pt Will Perform Toileting - Hygiene: with modified independence;Sit to stand from 3-in-1/toilet  Visit Information  Last OT Received On: 02/25/12 Assistance Needed: +2    Subjective Data      Prior Functioning       Cognition  Overall Cognitive Status: Impaired Area of Impairment: Safety/judgement Arousal/Alertness: Awake/alert Orientation Level: Appears intact for tasks assessed Behavior During Session: Anxious Safety/Judgement: Decreased safety judgement for tasks assessed;Decreased awareness of safety precautions;Decreased awareness of need for assistance Cognition - Other Comments: still adamant about going home, mobility was worse today but pt still not wanting to consider home even after long discussion    Mobility  Shoulder Instructions Bed Mobility Bed Mobility: Supine to Sit;Sitting - Scoot to Edge of Bed Supine to Sit: 4: Min assist Sitting - Scoot to Edge of Bed: 3: Mod assist Details for Bed Mobility Assistance:  tactile and verbal sequencing cues for efficiency of movement Transfers Sit to Stand: 1: +  2 Total assist;From bed;From chair/3-in-1;With upper extremity assist;With armrests Sit to Stand: Patient Percentage: 60% Stand to Sit: 1: +2 Total assist;With armrests;With upper extremity assist;To chair/3-in-1 Stand to Sit: Patient Percentage: 70% Details for Transfer Assistance: stood x3 as pt incontinent and fearful making transfers difficult so to sit on the 3in1 the bed had to be pulled from behind her and the 3in1 placed for stand->sit; pt able to pivot to recliner with increased time but needing bilateral stability assist and sequencing cues       Exercises      Balance Static Standing Balance Static Standing - Balance Support: Bilateral upper extremity supported Static Standing - Level of Assistance: 4: Min assist Static Standing - Comment/# of Minutes: stability assist, cues for downward force on RW and anterior translation of trunk over feet    End of Session OT - End of Session Equipment Utilized During Treatment: Gait belt Activity Tolerance: Patient limited by pain Patient left: in chair;with call bell/phone within reach Nurse Communication: Mobility status  GO     Aditri Louischarles 02/25/2012, 9:28 AM

## 2012-02-25 NOTE — Progress Notes (Signed)
I discussed with  Dr Bradshaw.  I agree with their plans documented in their progress note for today.  

## 2012-02-25 NOTE — Progress Notes (Signed)
Patient had a 6 beat run of wide complex tachycardia while resting in bed. BP 145/63 P 88(afib) R 12 SaO2 96% on 5L/[place]. Patient denied chest pain/dyspnea and had no associated symptoms. No obvious signs of acute distress noted. Strip posted on chart for further MD review.
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
## 2012-02-26 LAB — GLUCOSE, CAPILLARY
Glucose-Capillary: 153 mg/dL — ABNORMAL HIGH (ref 70–99)
Glucose-Capillary: 145 mg/dL — ABNORMAL HIGH (ref 70–99)

## 2012-02-26 LAB — BASIC METABOLIC PANEL
BUN: 39 mg/dL — ABNORMAL HIGH (ref 6–23)
GFR calc Af Amer: >90 mL/min (ref >90)
GFR calc non Af Amer: 82 mL/min — ABNORMAL LOW (ref >90)
Potassium: 3.3 mEq/L — ABNORMAL LOW (ref 3.5–5.1)
Sodium: 134 mEq/L — ABNORMAL LOW (ref 135–145)

## 2012-02-26 LAB — CARBOXYHEMOGLOBIN
Carboxyhemoglobin: 1.9 % — ABNORMAL HIGH (ref 0.5–1.5)
O2 Saturation: 56.3 %

## 2012-02-26 LAB — CBC
HCT: 28.4 % — ABNORMAL LOW (ref 36.0–46.0)
MCHC: 31.3 g/dL (ref 30.0–36.0)
RDW: 16 % — ABNORMAL HIGH (ref 11.5–15.5)

## 2012-02-26 MED ORDER — POTASSIUM CHLORIDE 20 MEQ PO PACK: 40.0000 meq | PACK | Freq: Once | ORAL | Status: DC

## 2012-02-26 MED ORDER — POTASSIUM CHLORIDE CRYS ER 20 MEQ PO TBCR: 20.0000 meq | EXTENDED_RELEASE_TABLET | Freq: Two times a day (BID) | ORAL | Status: DC

## 2012-02-26 MED ORDER — GLIMEPIRIDE 4 MG PO TABS
4.0000 mg | ORAL_TABLET | Freq: Every day | ORAL | Status: DC
  Administered 2012-02-26 – 2012-02-27 (×2): 4 mg via ORAL
  Filled 2012-02-26 (×4): qty 1

## 2012-02-26 MED ORDER — ENOXAPARIN SODIUM 40 MG/0.4ML ~~LOC~~ SOLN
40.0000 mg | SUBCUTANEOUS | Status: DC
  Administered 2012-02-27 – 2012-02-28 (×2): 40 mg via SUBCUTANEOUS
  Filled 2012-02-26 (×2): qty 0.4

## 2012-02-26 MED ORDER — GERHARDT'S BUTT CREAM
TOPICAL_CREAM | Freq: Two times a day (BID) | CUTANEOUS | Status: DC | PRN
  Administered 2012-02-26: 1 via TOPICAL
  Filled 2012-02-26: qty 1

## 2012-02-26 MED ORDER — GLUCERNA SHAKE PO LIQD
237.0000 mL | Freq: Three times a day (TID) | ORAL | Status: DC
  Administered 2012-02-26 – 2012-03-04 (×19): 237 mL via ORAL

## 2012-02-26 MED ORDER — POTASSIUM CHLORIDE CRYS ER 20 MEQ PO TBCR
40.0000 meq | EXTENDED_RELEASE_TABLET | ORAL | Status: AC
  Administered 2012-02-26 (×2): 40 meq via ORAL
  Filled 2012-02-26: qty 2

## 2012-02-26 NOTE — Progress Notes (Signed)
Patient c/o urine incontinence. Leak around foley catheter noted. Balloon only had 6ml of fluid. Additional 4ml of sterile saline injected to the baloon. Will continue to monitor for leak. Unable to measure this urinary output. Jorge Ny Roachester

## 2012-02-26 NOTE — Progress Notes (Signed)
Foley catheter leak noted again. Catheter replaced using sterile technique with an immediate return of urine. [name]

## 2012-02-26 NOTE — Progress Notes (Signed)
I discussed with Dr Madolyn Frieze.  I agree with their plans documented in their progress note for today.

## 2012-02-26 NOTE — Progress Notes (Signed)
Brief Nutrition Note:  RD pulled to pt for malnutrition screening tool.  Pt denies poor appetite or unintentional weight loss at this time.  Pt has orders for Glucerna. States meal completion is at baseline.  Body mass index is 26.16 kg/(m^2).  Overweight Diet: Carb Mod medium. Meal completion not documented.   Chart reviewed, no nutrition interventions warranted at this time. Please consult as needed.   Clarene Duke RD, LDN Pager [phone] After Hours pager [phone]
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
## 2012-02-26 NOTE — Progress Notes (Signed)
Patient ID: IREOLUWA GORSLINE, female   DOB: May 27, 1930, 79 y.o.   MRN: [id number]       Cardiology Progress Note Patient Name: [name] Date of Encounter: 02/26/2012, 7:35 AM     Subjective  Patient continues to lose weight, she is down another 4 lbs today.  CVP is down to 10.  Legs not as painful today.   RHC Done on dopamine 74mcg/kg/min  RA = 15  RV = 52/12/15  PA = 54/27 (37)  PCW = 20  Fick cardiac output/index = 4.5/2.4  Thermo CO/CI = 3.2/1.7  PVR = 5.4 [place] (Thermo)  PA sat = 30%, 32%  FA sat = 89% on 4L O2    Objective   Telemetry: A.Fib 60-80s     . acetaminophen  650 mg Rectal TID  . antiseptic oral rinse  15 mL Mouth Rinse BID  . aspirin  81 mg Oral Daily  . atorvastatin  10 mg Oral Daily  . carvedilol  12.5 mg Oral BID WC  . diltiazem  120 mg Oral Daily  . docusate sodium  100 mg Oral Daily  . enoxaparin  30 mg Subcutaneous Q24H  . feeding supplement  237 mL Oral TID BM  . ferrous sulfate  325 mg Oral BID  . glimepiride  4 mg Oral Q breakfast  . insulin aspart  0-5 Units Subcutaneous QHS  . insulin aspart  0-9 Units Subcutaneous TID WC  . levofloxacin  750 mg Intravenous Q48H  . multivitamin with minerals  1 tablet Oral Daily  . pantoprazole  40 mg Oral Q0600  . potassium chloride SA  20 mEq Oral BID  . potassium chloride  40 mEq Oral Once  . sodium chloride  3 mL Intravenous Q12H  . sodium chloride  3 mL Intravenous Q12H  dopamine @ 4 Lasix gtt @ 15    Physical Exam: Temp:  [98.9 F (37.2 C)-99.1 F (37.3 C)] 99 F (37.2 C) (01/14 0400) Pulse Rate:  [70-97] 81  (01/14 0700) Resp:  [19-20] 20  (01/13 2000) BP: (98-146)/(51-75) 146/72 mmHg (01/14 0700) SpO2:  [90 %-98 %] 93 % (01/14 0700) Weight:  [157 lb 3 oz (71.3 kg)] 157 lb 3 oz (71.3 kg) (01/14 0500)  General: Chronically ill appearing elderly female, in no acute distress. Head: Normocephalic, atraumatic, sclera non-icteric, nares are without discharge.  Neck: Supple. Negative for  carotid bruits. JVP 8-9 cm Lungs: Bibasilar rales. No wheezes or rhonchi. Breathing is unlabored on 5L O2. Heart: Irregular S1 S2 without murmurs, rubs, or gallops.  Abdomen: Soft, non-tender, non-distended with normoactive bowel sounds. No rebound/guarding. No obvious abdominal masses. Msk:  Strength and tone appear normal for age. Extremities: Trace BLE edema. Chronic skin discoloration changes to BLE L>R. BLE tender to touch. Distal pedal pulses are intact and equal bilaterally. Neuro: Alert and oriented X 3. Moves all extremities spontaneously. Psych:  Responds to questions appropriately with a normal affect.   Intake/Output Summary (Last 24 hours) at 02/26/12 0735 Last data filed at 02/26/12 0700  Gross per 24 hour  Intake   1854 ml  Output   2790 ml  Net   -936 ml    Labs:  [facility] 02/26/12 0430 02/25/12 0330  NA 134* 135  K 3.3* 3.5  CL 86* 88*  CO2 38* 37*  GLUCOSE 242* 163*  BUN 39* 34*  CREATININE 0.65 1.71*  CALCIUM 9.8 10.1  MG -- --  PHOS -- --   Basename 02/18/12 1629  AST 16  ALT 11  ALKPHOS 111  BILITOT 0.6  PROT 7.4  ALBUMIN 2.9*    Basename 02/26/12 0430 02/25/12 0330  WBC 8.9 13.1*  NEUTROABS -- --  HGB 8.9* 9.0*  HCT 28.4* 29.2*  MCV 89.6 91.3  PLT 189 186   Basename 02/19/12 0840 02/19/12 0149 02/18/12 2037  TROPONINI <0.30 <0.30 <0.30   Basename 02/19/12 0149  CHOL 124  HDL 36*  LDLCALC 71  TRIG 87  CHOLHDL 3.4   Basename 02/19/12 0149  VITAMINB12 840  FOLATE 7.5  FERRITIN 798*  TIBC Not calculated due to Iron <10.  IRON <10*  RETICCTPCT 1.8    Radiology/Studies:   02/19/2012 - CHEST - LEFT DECUBITUS   Findings: In left decubitus positioning, and there is layering partially of pleural effusion along the dependent thorax.  With fluid immobilization, there remains evidence of opacity in the retrocardiac left lower lobe suggesting airspace infiltrate.  IMPRESSION: At least partially free-flowing small pleural effusion.  Left  lower lobe infiltrate also identified.   02/19/2012 -  PORTABLE CHEST - 1 VIEW   Findings: Similar appearance of the dense left basilar opacity likely left pleural effusion.  Unchanged cardiomegaly with left atrial enlargement.  Probable enlargement of the main pulmonary artery.  Slightly increased pulmonary vascular congestion compared to yesterday.  Right subclavian approach cardiac rhythm maintenance device again noted.  No acute osseous abnormality.  IMPRESSION:  1.  Slightly increased pulmonary vascular congestion. 2.  Similar appearance of dense left basilar opacity with pleural effusion. 3.  Stable cardiomegaly with left atrial and likely pulmonary arterial enlargement       Assessment and Plan  79 y.o. female w/ PMHx significant for diastolic HF in setting of hypertrophic cardiomyopathy, chronic afib with sick sinus syndrome s/p [employer] PPM (not an anticoag candidate), CKD Stage IV (Cr baseline 2.1-2.3), nonobstructive CAD (per cath 2007), Chronic Respiratory Failure (chronic O2 @ 2L), CVA, HTN, DM, HLD and depression who presented to [facility] on [facility]12 with shortness of breath, volume overload, ? Aspiration, and wide complex tachycardia.  1. Atrial fibrillation: Chronic, rate is controlled on Coreg and diltiazem CD.  She is no longer on warfarin due to falls and recent GI bleed.  She is on ASA 81.  2. Acute on chronic diastolic CHF: Doing better on combination of dopamine/Lasix gtt.  CVP still elevated at 10 but better.  RHC numbers looked like right heart failure out of proportion to left heart failure with a component of pulmonary arterial HTN.  I suspect this may be due to perhaps OHS and hypoxemia over a long period (she has been on home oxygen for years).  Weight continues to fall. - Continue IV Lasix/dopamine combination today, probably will transition over to po tomorrow.  3. PNA: Possible component of aspiration.  Febrile at admission, now afebrile.  Antibiotics per primary  team. 4. Proteus UTI: Abx per primary team.  5. WCT: ? afib with aberrancy.  Continue Coreg.  Follow telemetry, no recurrence.  6. Renal: AKI.  Creatinine improved with dopamine gtt. 7. Anemia: Stable post-transfusion.  8. Very difficult home situation for years. Husband has gone to SNF. Needs PT work, want her to get out of bed again today.   [name] 02/26/2012 7:35 AM

## 2012-02-27 ENCOUNTER — Inpatient Hospital Stay (HOSPITAL_COMMUNITY): Payer: Medicare Other

## 2012-02-27 DIAGNOSIS — I509 Heart failure, unspecified: Secondary | ICD-10-CM

## 2012-02-27 LAB — CBC
HCT: 29.7 % — ABNORMAL LOW (ref 36.0–46.0)
Hemoglobin: 9.1 g/dL — ABNORMAL LOW (ref 12.0–15.0)
MCHC: 30.6 g/dL (ref 30.0–36.0)
RBC: 3.24 MIL/uL — ABNORMAL LOW (ref 3.87–5.11)
WBC: 9.4 10*3/uL (ref 4.0–10.5)

## 2012-02-27 LAB — BASIC METABOLIC PANEL
BUN: 51 mg/dL — ABNORMAL HIGH (ref 6–23)
CO2: 34 mEq/L — ABNORMAL HIGH (ref 19–32)
Calcium: 9.4 mg/dL (ref 8.4–10.5)
Chloride: 86 mEq/L — ABNORMAL LOW (ref 96–112)
Chloride: 90 mEq/L — ABNORMAL LOW (ref 96–112)
GFR calc non Af Amer: 89 mL/min — ABNORMAL LOW (ref >90)
Glucose, Bld: 249 mg/dL — ABNORMAL HIGH (ref 70–99)
Glucose, Bld: 133 mg/dL — ABNORMAL HIGH (ref 70–99)
Potassium: 3.6 mEq/L (ref 3.5–5.1)
Sodium: 131 mEq/L — ABNORMAL LOW (ref 135–145)
Sodium: 135 mEq/L (ref 135–145)

## 2012-02-27 LAB — CARBOXYHEMOGLOBIN
Carboxyhemoglobin: 1.8 % — ABNORMAL HIGH (ref 0.5–1.5)
Methemoglobin: 1.2 % (ref 0.0–1.5)
O2 Saturation: 99.9 %

## 2012-02-27 LAB — GLUCOSE, CAPILLARY: Glucose-Capillary: 133 mg/dL — ABNORMAL HIGH (ref 70–99)

## 2012-02-27 LAB — CLOSTRIDIUM DIFFICILE BY PCR: Toxigenic C. Difficile by PCR: POSITIVE — AB

## 2012-02-27 MED ORDER — METRONIDAZOLE 500 MG PO TABS
500.0000 mg | ORAL_TABLET | Freq: Three times a day (TID) | ORAL | Status: DC
  Administered 2012-02-27 – 2012-02-29 (×6): 500 mg via ORAL
  Filled 2012-02-27 (×9): qty 1

## 2012-02-27 MED ORDER — TORSEMIDE 20 MG PO TABS
80.0000 mg | ORAL_TABLET | Freq: Every day | ORAL | Status: DC
  Administered 2012-02-27: 80 mg via ORAL
  Filled 2012-02-27: qty 4

## 2012-02-27 MED ORDER — ALTEPLASE 100 MG IV SOLR: 2.0000 mg | Freq: Once | INTRAVENOUS | Status: DC

## 2012-02-27 MED ORDER — DULOXETINE HCL 30 MG PO CPEP
30.0000 mg | ORAL_CAPSULE | Freq: Every day | ORAL | Status: DC
  Administered 2012-02-27 – 2012-03-04 (×7): 30 mg via ORAL
  Filled 2012-02-27 (×7): qty 1

## 2012-02-27 MED ORDER — ACETAMINOPHEN 325 MG PO TABS
650.0000 mg | ORAL_TABLET | Freq: Three times a day (TID) | ORAL | Status: DC
  Administered 2012-02-27 – 2012-03-04 (×17): 650 mg via ORAL
  Filled 2012-02-27 (×17): qty 2

## 2012-02-27 MED ORDER — ALTEPLASE 100 MG IV SOLR
6.0000 mg | Freq: Once | INTRAVENOUS | Status: DC
  Filled 2012-02-27: qty 6

## 2012-02-27 NOTE — Progress Notes (Signed)
Occupational Therapy Treatment Note   02/27/12 1200  OT Visit Information  Last OT Received On 02/27/12  Assistance Needed +2  PT/OT Co-Evaluation/Treatment Yes  OT Time Calculation  OT Start Time 1112  OT Stop Time 1157  OT Time Calculation (min) 45 min  Precautions  Precautions Fall  ADL  Grooming Performed;Brushing hair;Supervision/safety  Where Assessed - Grooming Supported sitting  Network engineer Total assistance  Toilet Transfer: Patient Percentage 50%  Statistician Method Sit to Production manager Other (comment) (bed to chair)  Toileting - Clothing Manipulation and Hygiene Performed;+1 Total assistance  Where Assessed - Engineer, mining and Hygiene Standing  Equipment Used Gait belt;Rolling walker  Transfers/Ambulation Related to ADLs +2 total assist (pt=50%) ambulating with RW ~4 ft. Assist for weight shift and to advance LLE. Frequent verbal and tactile cues for posture due to tendency to keep hips flexed and lean back on heels.  ADL Comments Pt having diarrhea upon PT/OT arrival.  Pt stood wtih OT/PT assist while RN provided assist for peri care and fasten incontinence pad around pt's hips.  Pt brushed hair while sitting in chair but with increased effort due to fatigue.  Cognition  Overall Cognitive Status Impaired  Area of Impairment Problem solving  Arousal/Alertness Awake/alert  Orientation Level Appears intact for tasks assessed  Behavior During Session Anxious  Problem Solving Some difficulty problem solving sit<>stand technique.   Cognition - Other Comments Pt intermittently emotional during session due to multiple stressors (concerned that her daughters don't care about her, friends recently passing away).  Pt would become labile but easily redirected to task at hand.  Bed Mobility  Bed Mobility Supine to Sit;Sitting - Scoot to Edge of Bed  Supine to Sit 5: Supervision;With rails;HOB elevated (HOB 40 degrees)    Sitting - Scoot to Edge of Bed 4: Min guard  Transfers  Transfers Sit to Stand;Stand to Sit  Sit to Stand 1: +2 Total assist;From bed;From chair/3-in-1;With armrests;With upper extremity assist  Sit to Stand: Patient Percentage 50%  Stand to Sit 1: +2 Total assist;To chair/3-in-1;To bed;With armrests;With upper extremity assist  Stand to Sit: Patient Percentage 60%  Details for Transfer Assistance Verbal cues and manual facilitation for anterior weight shift and power up from bed/chair. VC for safe hand placement.   Balance  Balance Assessed Yes  Static Sitting Balance  Static Sitting - Balance Support Feet supported;No upper extremity supported  Static Sitting - Level of Assistance 5: Stand by assistance  Static Sitting - Comment/# of Minutes 5 minutes EOB  Static Standing Balance  Static Standing - Balance Support Bilateral upper extremity supported  Static Standing - Level of Assistance 4: Min assist;1: +2 Total assist;5: Stand by assistance  Static Standing - Comment/# of Minutes Initally required +2 assist with verbal cues and manual facilitation to trunk and bil hips for anterior translation and downward force on RW. Pt then able to stand ~10 min with close guarding and intermittent min assist.  Dynamic Standing Balance  Dynamic Standing - Balance Support Bilateral upper extremity supported  Dynamic Standing - Level of Assistance 3: Mod assist  Dynamic Standing - Balance Activities Lateral lean/weight shifting;Forward lean/weight shifting;Reaching for objects  Dynamic Standing - Comments Manual facilitation to assist with weight shift anteriorly as well as to left/right sides.   OT - End of Session  Equipment Utilized During Treatment Gait belt  Activity Tolerance Patient tolerated treatment well  Patient left in chair;with call bell/phone within reach  Nurse  Communication Mobility status  OT Assessment/Plan  Comments on Treatment Session Pt reporting today that she thinks going  to SNF for rehab may be helpful (which PT/OT has been recommending as safest option).  Reports she would like to go to [place] since she has been there before.  Pt slowly progressing towards goals with max encouragement.  OT Plan Discharge plan needs to be updated  OT Frequency Min 2X/week  Follow Up Recommendations SNF;Supervision/Assistance - 24 hour  OT Equipment 3 in 1 bedside comode  Acute Rehab OT Goals  Time For Goal Achievement 03/05/12  Potential to Achieve Goals Good  ADL Goals  Pt Will Perform Grooming with modified independence;Standing at sink  ADL Goal: Grooming - Progress Progressing toward goals  Pt Will Transfer to Toilet with modified independence;Ambulation;with DME;Comfort height toilet  ADL Goal: Toilet Transfer - Progress Progressing toward goals  Pt Will Perform Toileting - Clothing Manipulation with modified independence;Sitting on 3-in-1 or toilet;Standing  ADL Goal: Toileting - Clothing Manipulation - Progress Not progressing  Pt Will Perform Toileting - Hygiene with modified independence;Sit to stand from 3-in-1/toilet  ADL Goal: Toileting - Hygiene - Progress Not progressing  OT General Charges  $OT Visit 1 Procedure  OT Treatments  $Self Care/Home Management  23-37 mins  $Therapeutic Activity 8-22 mins   02/27/2012 Cipriano Mile OTR/L Pager [phone] Office [phone]

## 2012-02-27 NOTE — Progress Notes (Signed)
Notified by RN about Diarrhea.  C diff obtained and was positive.  Started treatment with Flagyl.  Holding Torsemide at this time.

## 2012-02-27 NOTE — Progress Notes (Signed)
Report given to [place] RN. Pt's bed in lowest position and bed locked. VSS.

## 2012-02-27 NOTE — Clinical Social Work Placement (Addendum)
     Clinical Social Work Department CLINICAL SOCIAL WORK PLACEMENT NOTE 03/03/2012  Patient:  [name], [name]  Account Number:  [id number] Admit date:  02/18/2012  Clinical Social Worker:  Hulan Fray  Date/time:  02/27/2012 05:26 PM  Clinical Social Work is seeking post-discharge placement for this patient at the following level of care:   SKILLED NURSING   (*CSW will update this form in Epic as items are completed)   02/27/2012  Patient/family provided with [facility] Department of Clinical Social Works list of facilities offering this level of care within the geographic area requested by the patient (or if unable, by the patients family).  02/27/2012  Patient/family informed of their freedom to choose among providers that offer the needed level of care, that participate in Medicare, Medicaid or managed care program needed by the patient, have an available bed and are willing to accept the patient.  02/27/2012  Patient/family informed of [facility] ownership interest in [facility], as well as of the fact that they are under no obligation to receive care at this facility.  PASARR submitted to EDS on 02/27/2012 PASARR number received from EDS on   FL2 transmitted to all facilities in geographic area requested by pt/family on  02/27/2012 FL2 transmitted to all facilities within larger geographic area on   Patient informed that his/her managed care company has contracts with or will negotiate with  certain facilities, including the following:     Patient/family informed of bed offers received:  03/03/2012 Patient chooses bed at [facility] Physician recommends and patient chooses bed at  [facility]  Patient to be transferred to [facility] on  03/03/2012 Patient to be transferred to facility by   The following physician request were entered in Epic:   Additional Comments:
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
## 2012-02-27 NOTE — Progress Notes (Signed)
Radiology and MD consulted in regards to PICC line misplacement. PICC line need will be evaluated in AM. IV team paged for need of PIV. Will continue to monitor pt. VSS.

## 2012-02-27 NOTE — Progress Notes (Signed)
Patient ID: [name], female   DOB: [birth year], 79 y.o.   MRN: [id number]       Cardiology Progress Note Patient Name: [name] Date of Encounter: 02/27/2012, 8:02 AM     Subjective  CVP is 9 this morning.  We may have reached the limit for effective diuresis (she did not diurese well yesterday).  She denies dyspnea.   RHC Done on dopamine 37mcg/kg/min  RA = 15  RV = 52/12/15  PA = 54/27 (37)  PCW = 20  Fick cardiac output/index = 4.5/2.4  Thermo CO/CI = 3.2/1.7  PVR = 5.4 Woods (Thermo)  PA sat = 30%, 32%  FA sat = 89% on 4L O2    Objective   Telemetry: A.Fib 60-80s     . acetaminophen  650 mg Rectal TID  . antiseptic oral rinse  15 mL Mouth Rinse BID  . aspirin  81 mg Oral Daily  . atorvastatin  10 mg Oral Daily  . carvedilol  12.5 mg Oral BID WC  . diltiazem  120 mg Oral Daily  . docusate sodium  100 mg Oral Daily  . enoxaparin  40 mg Subcutaneous Q24H  . feeding supplement  237 mL Oral TID BM  . ferrous sulfate  325 mg Oral BID  . glimepiride  4 mg Oral Q breakfast  . insulin aspart  0-5 Units Subcutaneous QHS  . insulin aspart  0-9 Units Subcutaneous TID WC  . multivitamin with minerals  1 tablet Oral Daily  . pantoprazole  40 mg Oral Q0600  . potassium chloride  40 mEq Oral Once  . sodium chloride  3 mL Intravenous Q12H  . sodium chloride  3 mL Intravenous Q12H  . torsemide  80 mg Oral Daily  dopamine @ 4 Lasix gtt @ 15    Physical Exam: Temp:  [97.6 F (36.4 C)-98.7 F (37.1 C)] 98 F (36.7 C) (01/15 0400) Pulse Rate:  [70-82] 70  (01/15 0600) Resp:  [16] 16  (01/14 2006) BP: (103-136)/(51-67) 110/53 mmHg (01/15 0600) SpO2:  [87 %-99 %] 91 % (01/15 0600) Weight:  [161 lb 2.5 oz (73.1 kg)] 161 lb 2.5 oz (73.1 kg) (01/15 0500)  General: Chronically ill appearing elderly female, in no acute distress. Head: Normocephalic, atraumatic, sclera non-icteric, nares are without discharge.  Neck: Supple. Negative for carotid bruits. JVP 8 cm Lungs:  Bibasilar rales. No wheezes or rhonchi. Breathing is unlabored on 5L O2. Heart: Irregular S1 S2 without murmurs, rubs, or gallops.  Abdomen: Soft, non-tender, non-distended with normoactive bowel sounds. No rebound/guarding. No obvious abdominal masses. Msk:  Strength and tone appear normal for age. Extremities: Trace BLE edema. Chronic skin discoloration changes to BLE L>R. BLE tender to touch. Distal pedal pulses are intact and equal bilaterally. Neuro: Alert and oriented X 3. Moves all extremities spontaneously. Psych:  Responds to questions appropriately with a normal affect.   Intake/Output Summary (Last 24 hours) at 02/27/12 0802 Last data filed at 02/27/12 0600  Gross per 24 hour  Intake   1502 ml  Output   1750 ml  Net   -248 ml    Labs:  Basename 02/27/12 0500 02/26/12 0430  NA 131* 134*  K 3.6 3.3*  CL 86* 86*  CO2 35* 38*  GLUCOSE 249* 242*  BUN 51* 39*  CREATININE 0.52 0.65  CALCIUM 9.5 9.8  MG -- --  PHOS -- --   Basename 02/18/12 1629  AST 16  ALT 11  ALKPHOS 111  BILITOT 0.6  PROT 7.4  ALBUMIN 2.9*    Basename 02/27/12 0500 02/26/12 0430  WBC 9.4 8.9  NEUTROABS -- --  HGB 9.1* 8.9*  HCT 29.7* 28.4*  MCV 91.7 89.6  PLT 219 189   Basename 02/19/12 0840 02/19/12 0149 02/18/12 2037  TROPONINI <0.30 <0.30 <0.30   Basename 02/19/12 0149  CHOL 124  HDL 36*  LDLCALC 71  TRIG 87  CHOLHDL 3.4   Basename 02/19/12 0149  VITAMINB12 840  FOLATE 7.5  FERRITIN 798*  TIBC Not calculated due to Iron <10.  IRON <10*  RETICCTPCT 1.8    Radiology/Studies:   02/19/2012 - CHEST - LEFT DECUBITUS   Findings: In left decubitus positioning, and there is layering partially of pleural effusion along the dependent thorax.  With fluid immobilization, there remains evidence of opacity in the retrocardiac left lower lobe suggesting airspace infiltrate.  IMPRESSION: At least partially free-flowing small pleural effusion.  Left lower lobe infiltrate also identified.    02/19/2012 -  PORTABLE CHEST - 1 VIEW   Findings: Similar appearance of the dense left basilar opacity likely left pleural effusion.  Unchanged cardiomegaly with left atrial enlargement.  Probable enlargement of the main pulmonary artery.  Slightly increased pulmonary vascular congestion compared to yesterday.  Right subclavian approach cardiac rhythm maintenance device again noted.  No acute osseous abnormality.  IMPRESSION:  1.  Slightly increased pulmonary vascular congestion. 2.  Similar appearance of dense left basilar opacity with pleural effusion. 3.  Stable cardiomegaly with left atrial and likely pulmonary arterial enlargement       Assessment and Plan  79 y.o. female w/ PMHx significant for diastolic HF in setting of hypertrophic cardiomyopathy, chronic afib with sick sinus syndrome s/p St Jude PPM (not an anticoag candidate), CKD Stage IV (Cr baseline 2.1-2.3), nonobstructive CAD (per cath 2007), Chronic Respiratory Failure (chronic O2 @ 2L), CVA, HTN, DM, HLD and depression who presented to [facility] on 02/18/12 with shortness of breath, volume overload, ? Aspiration, and wide complex tachycardia.  1. Atrial fibrillation: Chronic, rate is controlled on Coreg and diltiazem CD.  She is no longer on warfarin due to falls and recent GI bleed.  She is on ASA 81.  2. Acute on chronic diastolic CHF: CVP improved this morning but still mildly elevated.  She did not diurese well yesterday and BUN is up.  RHC numbers looked like right heart failure out of proportion to left heart failure with a component of pulmonary arterial HTN.  I suspect this may be due to perhaps OHS and hypoxemia over a long period (she has been on home oxygen for years).   - Today, will stop Lasix gtt and dopamine.  I think we have probably reached this limit of aggressive diuresis for her.  - Start torsemide 80 mg daily (she was on 100 mg daily long-term at home, after her previous hospitalization the dose was decreased  to 20 mg daily - not sure why).  3. PNA: Possible component of aspiration.  Febrile at admission, now afebrile.  Antibiotics per primary team. 4. Proteus UTI: Abx per primary team.  5. WCT: ? afib with aberrancy.  Continue Coreg.  Follow telemetry, no recurrence.  6. Renal: AKI.  Creatinine improved with dopamine gtt. 7. Anemia: Stable post-transfusion.  8. Needs continued PT work, ? SNF.  [name] 02/27/2012 8:02 AM

## 2012-02-27 NOTE — Clinical Social Work Note (Signed)
Clinical Social Worker was reconsulted for SNF placement. CSW spoke with patient again and she was receptive to placement at discharge. Patient reported that she prefers [facility]. CSW left SNF packet for patient and encourage patient to think of an alternative facility, if her preferred facility did not have availability. CSW will complete FL2 for MD's signature and will update patient when bed offers are made.   Rozetta Nunnery MSW, [employer] [phone]

## 2012-02-27 NOTE — Progress Notes (Signed)
Physical Therapy Treatment Patient Details Name: [name] MRN: [id number] DOB: September 24, 1930 Today's Date: 02/27/2012 Time: 4098-1191 PT Time Calculation (min): 45 min  PT Assessment / Plan / Recommendation Comments on Treatment Session  Admitted for dyspnea after aspirating on milk. Being treated for volume overload in the CHF. Still limited today by incontinence of loose stool but applied a pad and pt able to ambulate some today but needing 2 assist. Worked primarily on standing balance and activity tolerance today. Pt seems more willing to consider SNF at this point. Was very emotional throughout session.     Follow Up Recommendations  SNF;Supervision/Assistance - 24 hour     Does the patient have the potential to tolerate intense rehabilitation     Barriers to Discharge        Equipment Recommendations  None recommended by PT    Recommendations for Other Services    Frequency Min 3X/week   Plan Discharge plan remains appropriate;Frequency remains appropriate    Precautions / Restrictions Precautions Precautions: Fall   Pertinent Vitals/Pain C/o 10/10 leg pain, RN made aware and medication provided    Mobility  Bed Mobility Bed Mobility: Supine to Sit;Sitting - Scoot to Edge of Bed Supine to Sit: 5: Supervision;With rails;HOB elevated (HOB 40 degrees) Sitting - Scoot to Edge of Bed: 4: Min guard Transfers Sit to Stand: 1: +2 Total assist;From bed;From chair/3-in-1;With armrests;With upper extremity assist Sit to Stand: Patient Percentage: 50% Stand to Sit: 1: +2 Total assist;To chair/3-in-1;To bed;With armrests;With upper extremity assist Stand to Sit: Patient Percentage: 60% Details for Transfer Assistance: Verbal cues and manual facilitation for anterior weight shift and power up from bed/chair. VC for safe hand placement.  Ambulation/Gait Ambulation/Gait Assistance: 1: +2 Total assist Ambulation/Gait: Patient Percentage: 40% Ambulation Distance (Feet): 8  Feet Assistive device: Rolling walker Ambulation/Gait Assistance Details: max verbal and faciliatory cues for midline and tall posture posture, weight shift and assist to step, max facilitation bilaterally for anterior weight shift, pt anxious throughout and needing a lot of encouragement Gait Pattern: Trunk flexed;Decreased stride length;Decreased weight shift to left General Gait Details: weight in her heels     PT Goals Acute Rehab PT Goals PT Goal: Supine/Side to Sit - Progress: Progressing toward goal PT Goal: Sit to Supine/Side - Progress: Progressing toward goal PT Goal: Sit to Stand - Progress: Progressing toward goal PT Goal: Stand to Sit - Progress: Progressing toward goal PT Transfer Goal: Bed to Chair/Chair to Bed - Progress: Progressing toward goal PT Goal: Ambulate - Progress: Progressing toward goal  Visit Information  Last PT Received On: 02/27/12 Assistance Needed: +2    Subjective Data  Subjective: I can't.  Patient Stated Goal: thinking about going to [place] now   Cognition  Overall Cognitive Status: Impaired Area of Impairment: Problem solving Arousal/Alertness: Awake/alert Orientation Level: Appears intact for tasks assessed Behavior During Session: Anxious Problem Solving: Some difficulty problem solving sit<>stand technique.  Cognition - Other Comments: Pt intermittently emotional during session due to multiple stressors (concerned that her daughters don't care about her, friends recently passing away).  Pt would become labile but easily redirected to task at hand.    Balance  Balance Balance Assessed: Yes Static Sitting Balance Static Sitting - Balance Support: Feet supported;No upper extremity supported Static Sitting - Level of Assistance: 5: Stand by assistance Static Sitting - Comment/# of Minutes: 5 minutes EOB Static Standing Balance Static Standing - Balance Support: Bilateral upper extremity supported Static Standing - Level of Assistance:  1: +  2 Total assist;4: Min assist;5: Stand by assistance Static Standing - Comment/# of Minutes: initially pt needing bilateral facilitation for anterior weight shift and tall posture, pt then able to stand with close gaurd-minA  for at least 10 minutes during session Dynamic Standing Balance Dynamic Standing - Balance Support: Bilateral upper extremity supported Dynamic Standing - Level of Assistance: 4: Min assist Dynamic Standing - Balance Activities: Lateral lean/weight shifting;Forward lean/weight shifting Dynamic Standing - Comments: min tactile cues for posture and weight shift  End of Session PT - End of Session Equipment Utilized During Treatment: Gait belt Activity Tolerance: Patient tolerated treatment well;Patient limited by fatigue Patient left: in chair;with call bell/phone within reach Nurse Communication: Mobility status   GP     [facility] [name] 02/27/2012, 1:50 PM

## 2012-02-27 NOTE — Progress Notes (Signed)
Pt PICC line does not flush or have blood return. TPA ordered. IV team paged. Unable to get CVP or lab via nurse draw at this time.  Lab made aware to come and collect labs ordered. Will continue to monitor. Pt VSS.

## 2012-02-27 NOTE — Progress Notes (Signed)
I discussed with  Dr Bradshaw.  I agree with their plans documented in their progress note for today.  

## 2012-02-28 LAB — BASIC METABOLIC PANEL
BUN: 62 mg/dL — ABNORMAL HIGH (ref 6–23)
Chloride: 90 mEq/L — ABNORMAL LOW (ref 96–112)
Creatinine, Ser: 2.36 mg/dL — ABNORMAL HIGH (ref 0.50–1.10)
GFR calc Af Amer: 21 mL/min — ABNORMAL LOW (ref >90)
Glucose, Bld: 60 mg/dL — ABNORMAL LOW (ref 70–99)
Potassium: 4.3 mEq/L (ref 3.5–5.1)

## 2012-02-28 LAB — CBC
HCT: 28.9 % — ABNORMAL LOW (ref 36.0–46.0)
Hemoglobin: 8.9 g/dL — ABNORMAL LOW (ref 12.0–15.0)
MCHC: 30.8 g/dL (ref 30.0–36.0)
MCV: 90.6 fL (ref 78.0–100.0)
WBC: 10.7 10*3/uL — ABNORMAL HIGH (ref 4.0–10.5)

## 2012-02-28 LAB — GLUCOSE, CAPILLARY
Glucose-Capillary: 56 mg/dL — ABNORMAL LOW (ref 70–99)
Glucose-Capillary: 89 mg/dL (ref 70–99)
Glucose-Capillary: 66 mg/dL — ABNORMAL LOW (ref 70–99)
Glucose-Capillary: 109 mg/dL — ABNORMAL HIGH (ref 70–99)

## 2012-02-28 MED ORDER — ENOXAPARIN SODIUM 30 MG/0.3ML ~~LOC~~ SOLN
30.0000 mg | SUBCUTANEOUS | Status: DC
  Administered 2012-02-29 – 2012-03-03 (×4): 30 mg via SUBCUTANEOUS
  Filled 2012-02-28 (×5): qty 0.3

## 2012-02-28 MED ORDER — SODIUM CHLORIDE 0.9 % IV SOLN: INTRAVENOUS | Status: DC

## 2012-02-28 MED ORDER — SODIUM CHLORIDE 0.9 % IV BOLUS (SEPSIS)
250.0000 mL | Freq: Once | INTRAVENOUS | Status: AC
Start: 2012-02-28 — End: 2012-02-28
  Administered 2012-02-28: 250 mL via INTRAVENOUS

## 2012-02-28 NOTE — Progress Notes (Signed)
Per Dr. Charm Barges okay to use pts PICC for fluids. PICC not blood returning bc of position. Radiology will need fix this AM. PICC currently capped off.  [name], Lindsey Elizabeth

## 2012-02-28 NOTE — Progress Notes (Signed)
Pt's BP began to decrease to SBP in the 70s.  MD notified; 250cc bolus given; will continue to monitor and update as needed

## 2012-02-28 NOTE — Progress Notes (Addendum)
Interval note  Called to bedside by RN for persistently low BP fo rthe last 1 hour. Patients states she feels fine and is feeling better than earlier today. She confirms that she is DNR/DNI and does not want pressors if necessary.   O: Filed Vitals:   02/28/12 1800  BP: 77/45  Pulse: 66  Temp:   Resp:   Satting 90% on 2 L [place]   G: NAD, laying in bed, speaks easily Abd: soft, NT/ND N: Alert and oriented  A/P:  79 y/o F admitted for HCAP, CHF exacerbation, An dnow found to be C diff + on 1/15. Now with low BPs - Still DNR/DNI - 250 mL NS bolus, 50 mL/hr for the next 6 hours after that.  - Spoke with her son, Asia Dusenbury about her condition.   Kevin Fenton MD 02/28/2012, [phone]

## 2012-02-28 NOTE — Progress Notes (Signed)
Patient ID: LAURANN MCMORRIS, female   DOB: June 09, 1930, 79 y.o.   MRN: [id number]       Cardiology Progress Note Patient Name: DAVETTE NUGENT Date of Encounter: 02/28/2012, 7:49 AM     Subjective  PICC not drawing back this morning, not in right location.  CVP probably not accurate.  Creatinine up to 2.3 off dopamine in the setting of profuse diarrhea (C difficile positive).  She denies dyspnea.  RHC Done on dopamine 19mcg/kg/min  RA = 15  RV = 52/12/15  PA = 54/27 (37)  PCW = 20  Fick cardiac output/index = 4.5/2.4  Thermo CO/CI = 3.2/1.7  PVR = 5.4 Woods (Thermo)  PA sat = 30%, 32%  FA sat = 89% on 4L O2    Objective   Telemetry: A.Fib 60-80s     . acetaminophen  650 mg Oral TID  . alteplase  6 mg Intracatheter Once  . antiseptic oral rinse  15 mL Mouth Rinse BID  . aspirin  81 mg Oral Daily  . atorvastatin  10 mg Oral Daily  . carvedilol  12.5 mg Oral BID WC  . diltiazem  120 mg Oral Daily  . docusate sodium  100 mg Oral Daily  . DULoxetine  30 mg Oral Daily  . enoxaparin  40 mg Subcutaneous Q24H  . feeding supplement  237 mL Oral TID BM  . ferrous sulfate  325 mg Oral BID  . glimepiride  4 mg Oral Q breakfast  . insulin aspart  0-5 Units Subcutaneous QHS  . insulin aspart  0-9 Units Subcutaneous TID WC  . metroNIDAZOLE  500 mg Oral Q8H  . multivitamin with minerals  1 tablet Oral Daily  . pantoprazole  40 mg Oral Q0600  . potassium chloride  40 mEq Oral Once  . sodium chloride  3 mL Intravenous Q12H  . sodium chloride  3 mL Intravenous Q12H      Physical Exam: Temp:  [97.9 F (36.6 C)-98.3 F (36.8 C)] 98.2 F (36.8 C) (01/16 0343) Pulse Rate:  [55-84] 78  (01/16 0600) Resp:  [20] 20  (01/15 0800) BP: (84-125)/(44-90) 111/57 mmHg (01/16 0600) SpO2:  [91 %-100 %] 100 % (01/16 0600) Weight:  [155 lb 13.8 oz (70.7 kg)] 155 lb 13.8 oz (70.7 kg) (01/16 0500)  General: Chronically ill appearing elderly female, in no acute distress. Head: Normocephalic,  atraumatic, sclera non-icteric, nares are without discharge.  Neck: Supple. Negative for carotid bruits. JVP 8-9 cm Lungs: Bibasilar rales. No wheezes or rhonchi. Breathing is unlabored on 5L O2. Heart: Irregular S1 S2 without murmurs, rubs, or gallops.  Abdomen: Soft, non-tender, non-distended with normoactive bowel sounds. No rebound/guarding. No obvious abdominal masses. Msk:  Strength and tone appear normal for age. Extremities: Trace BLE edema. Chronic skin discoloration changes to BLE L>R. BLE tender to touch. Distal pedal pulses are intact and equal bilaterally. Neuro: Alert and oriented X 3. Moves all extremities spontaneously. Psych:  Responds to questions appropriately with a normal affect.   Intake/Output Summary (Last 24 hours) at 02/28/12 0749 Last data filed at 02/28/12 0600  Gross per 24 hour  Intake    802 ml  Output   1275 ml  Net   -473 ml    Labs:  [facility] 02/28/12 0510 02/27/12 2016  NA 136 135  K 4.3 4.6  CL 90* 90*  CO2 34* 34*  GLUCOSE 60* 133*  BUN 62* 59*  CREATININE 2.36* 2.29*  CALCIUM 9.6 9.4  MG -- --  PHOS -- --   Basename 02/18/12 1629  AST 16  ALT 11  ALKPHOS 111  BILITOT 0.6  PROT 7.4  ALBUMIN 2.9*    Basename 02/28/12 0510 02/27/12 0500  WBC 10.7* 9.4  NEUTROABS -- --  HGB 8.9* 9.1*  HCT 28.9* 29.7*  MCV 90.6 91.7  PLT 228 219   Basename 02/19/12 0840 02/19/12 0149 02/18/12 2037  TROPONINI <0.30 <0.30 <0.30   Basename 02/19/12 0149  CHOL 124  HDL 36*  LDLCALC 71  TRIG 87  CHOLHDL 3.4   Basename 02/19/12 0149  VITAMINB12 840  FOLATE 7.5  FERRITIN 798*  TIBC Not calculated due to Iron <10.  IRON <10*  RETICCTPCT 1.8    Radiology/Studies:   02/19/2012 - CHEST - LEFT DECUBITUS   Findings: In left decubitus positioning, and there is layering partially of pleural effusion along the dependent thorax.  With fluid immobilization, there remains evidence of opacity in the retrocardiac left lower lobe suggesting airspace  infiltrate.  IMPRESSION: At least partially free-flowing small pleural effusion.  Left lower lobe infiltrate also identified.   02/19/2012 -  PORTABLE CHEST - 1 VIEW   Findings: Similar appearance of the dense left basilar opacity likely left pleural effusion.  Unchanged cardiomegaly with left atrial enlargement.  Probable enlargement of the main pulmonary artery.  Slightly increased pulmonary vascular congestion compared to yesterday.  Right subclavian approach cardiac rhythm maintenance device again noted.  No acute osseous abnormality.  IMPRESSION:  1.  Slightly increased pulmonary vascular congestion. 2.  Similar appearance of dense left basilar opacity with pleural effusion. 3.  Stable cardiomegaly with left atrial and likely pulmonary arterial enlargement       Assessment and Plan  79 y.o. female w/ PMHx significant for diastolic HF in setting of hypertrophic cardiomyopathy, chronic afib with sick sinus syndrome s/p St Jude PPM (not an anticoag candidate), CKD Stage IV (Cr baseline 2.1-2.3), nonobstructive CAD (per cath 2007), Chronic Respiratory Failure (chronic O2 @ 2L), CVA, HTN, DM, HLD and depression who presented to [facility] on 02/18/12 with shortness of breath, volume overload, ? Aspiration, and wide complex tachycardia.  1. Atrial fibrillation: Chronic, rate is controlled on Coreg and diltiazem CD.  She is no longer on warfarin due to falls and recent GI bleed.  She is on ASA 81.  2. Acute on chronic diastolic CHF:  RHC numbers looked like right heart failure out of proportion to left heart failure with a component of pulmonary arterial HTN.  I suspect this may be due to perhaps OHS and hypoxemia over a long period (she has been on home oxygen for years).  Dopamine/lasix were stopped yesterday.  Creatinine up to 2.3 today, which is likely a function of stopping dopamine and the profuse diarrhea.  Of note, her baseline creatinine at home has run 2.0 - 2.5 (it looks better when she is on  dopamine).  - Can hold torsemide today given significant diarrhea, likely restart po diuretic tomorrow.  3. C difficile diarrhea: Treatment per primary team.  4. Renal: Rise in creatinine off dopamine and with profuse diarrhea.  Hold torsemide today, probably restart tomorrow.   [name] 02/28/2012 7:49 AM
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
## 2012-02-28 NOTE — Progress Notes (Signed)
Aware of Cr increase.  Due to large frequent bowel movements due to C.Diff. Pt respiratory status improved.  Demadex stopped and encouraging PO fluids.  On PO flagyl. 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  IR to replace PICC in AM.  Appreciate Cardiology's reassessment of diuresis/fluid status.    Will continue to monitor renal function and fluid status.  Andrena Mews, DO [facility] Resident - PGY-2 02/28/2012 3:56 AM

## 2012-02-28 NOTE — Progress Notes (Signed)
I discussed with Dr Ermalinda Memos.  I agree with their plans documented in their progress note for today.  Pt with C. Diff colitis (low-risk), started on Flagyl for 14 days. SNF placement pending.
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
## 2012-02-28 NOTE — Progress Notes (Signed)
Inpatient Diabetes Program Recommendations  AACE/ADA: New Consensus Statement on Inpatient Glycemic Control (2013)  Target Ranges:  Prepandial:   less than 140 mg/dL      Peak postprandial:   less than 180 mg/dL (1-2 hours)      Critically ill patients:  140 - 180 mg/dL  Results for [name], [name] (MRN [id number]) as of 02/28/2012 13:19  Ref. Range 02/28/2012 07:56 02/28/2012 11:52  Glucose-Capillary Latest Range: 70-99 mg/dL 56 (L) 66 (L)   Inpatient Diabetes Program Recommendations Oral Agents: Discontinue Amaryl with Cr. =2.36 PO intake 50-75%. Will follow. Thank you  Piedad Climes BSN, RN,CDE Inpatient Diabetes Coordinator [phone] (team pager)

## 2012-02-29 LAB — BASIC METABOLIC PANEL
BUN: 69 mg/dL — ABNORMAL HIGH (ref 6–23)
CO2: 31 mEq/L (ref 19–32)
Chloride: 88 mEq/L — ABNORMAL LOW (ref 96–112)
Creatinine, Ser: 2.45 mg/dL — ABNORMAL HIGH (ref 0.50–1.10)
GFR calc Af Amer: 20 mL/min — ABNORMAL LOW (ref >90)
GFR calc non Af Amer: 18 mL/min — ABNORMAL LOW (ref >90)
Glucose, Bld: 65 mg/dL — ABNORMAL LOW (ref 70–99)
Potassium: 4 mEq/L (ref 3.5–5.1)
Sodium: 132 mEq/L — ABNORMAL LOW (ref 135–145)

## 2012-02-29 LAB — CBC
HCT: 27.7 % — ABNORMAL LOW (ref 36.0–46.0)
Hemoglobin: 8.5 g/dL — ABNORMAL LOW (ref 12.0–15.0)
MCHC: 30.7 g/dL (ref 30.0–36.0)
Platelets: 238 10*3/uL (ref 150–400)
RDW: 16.6 % — ABNORMAL HIGH (ref 11.5–15.5)
WBC: 14.4 10*3/uL — ABNORMAL HIGH (ref 4.0–10.5)

## 2012-02-29 LAB — GLUCOSE, CAPILLARY
Glucose-Capillary: 83 mg/dL (ref 70–99)
Glucose-Capillary: 75 mg/dL (ref 70–99)

## 2012-02-29 MED ORDER — SODIUM CHLORIDE 0.9 % IV SOLN: 250.0000 mL | INTRAVENOUS | Status: DC | PRN

## 2012-02-29 MED ORDER — VANCOMYCIN 50 MG/ML ORAL SOLUTION
125.0000 mg | Freq: Four times a day (QID) | ORAL | Status: DC
  Administered 2012-02-29 – 2012-03-04 (×15): 125 mg via ORAL
  Filled 2012-02-29 (×21): qty 2.5

## 2012-02-29 NOTE — Progress Notes (Signed)
Physical Therapy Treatment Patient Details Name: [name] MRN: [id number] DOB: 26-Apr-1930 Today's Date: 02/29/2012 Time: 4098-1191 PT Time Calculation (min): 25 min  PT Assessment / Plan / Recommendation Comments on Treatment Session  Slow improvement with walking tolerance.  Still limited due to pain right knee, though able to use walker to unweight sore leg.     Follow Up Recommendations  SNF           Equipment Recommendations  None recommended by PT       Frequency Min 3X/week   Plan Discharge plan remains appropriate    Precautions / Restrictions Precautions Precautions: Fall Precaution Comments: Contact for C-Diff   Pertinent Vitals/Pain VSS with ambulation on 4 L O2 [place]; Pain complaints right knee and hand    Mobility  Bed Mobility Supine to Sit: 4: Min assist Sitting - Scoot to Edge of Bed: 4: Min assist Transfers Sit to Stand: 1: +2 Total assist Sit to Stand: Patient Percentage: 50% Stand to Sit: With armrests;With upper extremity assist;To chair/3-in-1 (has uncontrolled descent last few inches into chair) Stand to Sit: Patient Percentage: 50% Ambulation/Gait Ambulation/Gait Assistance: 1: +2 Total assist Ambulation/Gait: Patient Percentage: 60% Ambulation Distance (Feet): 10 Feet Assistive device: Rolling walker Ambulation/Gait Assistance Details: antalgic on left, cues for UE weight bearing due to painful right knee Gait Pattern: Step-to pattern;Antalgic;Decreased stance time - left;Decreased step length - right      PT Goals Acute Rehab PT Goals Pt will go Supine/Side to Sit: with modified independence PT Goal: Supine/Side to Sit - Progress: Progressing toward goal Pt will go Sit to Stand: with modified independence PT Goal: Sit to Stand - Progress: Progressing toward goal Pt will go Stand to Sit: with modified independence PT Goal: Stand to Sit - Progress: Progressing toward goal Pt will Ambulate: 51 - 150 feet;with modified independence;with  least restrictive assistive device PT Goal: Ambulate - Progress: Progressing toward goal  Visit Information  Last PT Received On: 02/29/12    Subjective Data  Subjective: My daughter said I shouldn't say "I can't"   Cognition  Overall Cognitive Status: Impaired Area of Impairment: Problem solving Arousal/Alertness: Awake/alert Behavior During Session: WFL for tasks performed    Balance  Static Standing Balance Static Standing - Balance Support: Right upper extremity supported;Left upper extremity supported Static Standing - Level of Assistance: 3: Mod assist Static Standing - Comment/# of Minutes: stood several seconds prior to walking min-mod assist   End of Session PT - End of Session Equipment Utilized During Treatment: Gait belt Activity Tolerance: Patient limited by fatigue Patient left: in chair;with call bell/phone within reach   GP     [facility] 02/29/2012, 2:57 PM [place], [place] [phone] 02/29/2012

## 2012-02-29 NOTE — Progress Notes (Signed)
Patient ID: [name], female   DOB: [birth year], 79 y.o.   MRN: [id number]       Cardiology Progress Note Patient Name: [name] Date of Encounter: 02/29/2012, 7:44 AM     Subjective  Patient continues to have diarrhea periodically.  She was yesterday evening, pressure is fine now.  Has been made DNR/DNI does not want pressors, etc.  Main complaint continues to be leg pain.  No dyspnea while in bed.  RHC Done on dopamine 69mcg/kg/min  RA = 15  RV = 52/12/15  PA = 54/27 (37)  PCW = 20  Fick cardiac output/index = 4.5/2.4  Thermo CO/CI = 3.2/1.7  PVR = 5.4 [place] (Thermo)  PA sat = 30%, 32%  FA sat = 89% on 4L O2    Objective   Telemetry: A.Fib 60-80s     . acetaminophen  650 mg Oral TID  . alteplase  6 mg Intracatheter Once  . antiseptic oral rinse  15 mL Mouth Rinse BID  . aspirin  81 mg Oral Daily  . atorvastatin  10 mg Oral Daily  . carvedilol  12.5 mg Oral BID WC  . diltiazem  120 mg Oral Daily  . docusate sodium  100 mg Oral Daily  . DULoxetine  30 mg Oral Daily  . enoxaparin  30 mg Subcutaneous Q24H  . feeding supplement  237 mL Oral TID BM  . ferrous sulfate  325 mg Oral BID  . insulin aspart  0-5 Units Subcutaneous QHS  . insulin aspart  0-9 Units Subcutaneous TID WC  . metroNIDAZOLE  500 mg Oral Q8H  . multivitamin with minerals  1 tablet Oral Daily  . pantoprazole  40 mg Oral Q0600  . potassium chloride  40 mEq Oral Once  . sodium chloride  3 mL Intravenous Q12H  . sodium chloride  3 mL Intravenous Q12H      Physical Exam: Temp:  [97.7 F (36.5 C)-98.5 F (36.9 C)] 98.1 F (36.7 C) (01/17 0323) Pulse Rate:  [66-96] 77  (01/17 0323) Resp:  [16] 16  (01/16 2351) BP: (70-137)/(32-73) 94/59 mmHg (01/17 0323) SpO2:  [85 %-98 %] 92 % (01/17 0323) Weight:  [156 lb 15.5 oz (71.2 kg)] 156 lb 15.5 oz (71.2 kg) (01/17 0500)  General: Chronically ill appearing elderly female, in no acute distress. Head: Normocephalic, atraumatic, sclera non-icteric,  nares are without discharge.  Neck: Supple. Negative for carotid bruits. JVP 7-8 cm Lungs: Bibasilar rales. No wheezes or rhonchi. Breathing is unlabored on 5L O2. Heart: Irregular S1 S2 without murmurs, rubs, or gallops.  Abdomen: Soft, non-tender, non-distended with normoactive bowel sounds. No rebound/guarding. No obvious abdominal masses. Msk:  Strength and tone appear normal for age. Extremities: Trace BLE edema. Chronic skin discoloration changes to BLE L>R. BLE tender to touch. Distal pedal pulses are intact and equal bilaterally. Neuro: Alert and oriented X 3. Moves all extremities spontaneously. Psych:  Responds to questions appropriately with a normal affect.   Intake/Output Summary (Last 24 hours) at 02/29/12 0744 Last data filed at 02/29/12 0600  Gross per 24 hour  Intake 1134.5 ml  Output      0 ml  Net 1134.5 ml    Labs:  [facility] 02/29/12 0520 02/28/12 0510  NA 132* 136  K 4.0 4.3  CL 88* 90*  CO2 31 34*  GLUCOSE 65* 60*  BUN 69* 62*  CREATININE 2.45* 2.36*  CALCIUM 9.2 9.6  MG -- --  PHOS -- --  Basename 02/18/12 1629  AST 16  ALT 11  ALKPHOS 111  BILITOT 0.6  PROT 7.4  ALBUMIN 2.9*    Basename 02/29/12 0520 02/28/12 0510  WBC 14.4* 10.7*  NEUTROABS -- --  HGB 8.5* 8.9*  HCT 27.7* 28.9*  MCV 91.1 90.6  PLT 238 228   Basename 02/19/12 0840 02/19/12 0149 02/18/12 2037  TROPONINI <0.30 <0.30 <0.30   Basename 02/19/12 0149  CHOL 124  HDL 36*  LDLCALC 71  TRIG 87  CHOLHDL 3.4   Basename 02/19/12 0149  VITAMINB12 840  FOLATE 7.5  FERRITIN 798*  TIBC Not calculated due to Iron <10.  IRON <10*  RETICCTPCT 1.8    Radiology/Studies:   02/19/2012 - CHEST - LEFT DECUBITUS   Findings: In left decubitus positioning, and there is layering partially of pleural effusion along the dependent thorax.  With fluid immobilization, there remains evidence of opacity in the retrocardiac left lower lobe suggesting airspace infiltrate.  IMPRESSION: At least  partially free-flowing small pleural effusion.  Left lower lobe infiltrate also identified.   02/19/2012 -  PORTABLE CHEST - 1 VIEW   Findings: Similar appearance of the dense left basilar opacity likely left pleural effusion.  Unchanged cardiomegaly with left atrial enlargement.  Probable enlargement of the main pulmonary artery.  Slightly increased pulmonary vascular congestion compared to yesterday.  Right subclavian approach cardiac rhythm maintenance device again noted.  No acute osseous abnormality.  IMPRESSION:  1.  Slightly increased pulmonary vascular congestion. 2.  Similar appearance of dense left basilar opacity with pleural effusion. 3.  Stable cardiomegaly with left atrial and likely pulmonary arterial enlargement       Assessment and Plan  79 y.o. female w/ PMHx significant for diastolic HF in setting of hypertrophic cardiomyopathy, chronic afib with sick sinus syndrome s/p [employer] PPM (not an anticoag candidate), CKD Stage IV (Cr baseline 2.1-2.3), nonobstructive CAD (per cath 2007), Chronic Respiratory Failure (chronic O2 @ 2L), CVA, HTN, DM, HLD and depression who [facility] with shortness of breath, volume overload, ? Aspiration, and wide complex tachycardia.  1. Atrial fibrillation: Chronic, rate is controlled on Coreg and diltiazem CD.  She is no longer on warfarin due to falls and recent GI bleed.  She is on ASA 81.  2. Acute on chronic diastolic CHF:  RHC numbers looked like right heart failure out of proportion to left heart failure with a component of pulmonary arterial HTN.  I suspect this may be due to perhaps OHS and hypoxemia over a long period (she has been on home oxygen for years).  She diuresed well with dopamine/Lasix, now off.  BP low last night but better today.  No longer can get CVP off PICC but she does not appear significantly volume overloaded on exam.  Creatinine/BUN are higher again today.  Of note, her baseline creatinine at home has  run 2.0 - 2.5 (it looked better when she was on dopamine).  - Can hold torsemide today given significant diarrhea and rise in creatinine, would restart when creatinine stabilizes out as she will need long-term (would use torsemide 80 mg daily eventually, she had done well on this dose and had CHF exacerbation when dose was lowered).  3. C difficile diarrhea: Treatment per primary team with Flagyl.  4. Renal: Rise in creatinine off dopamine and with  diarrhea.  Hold torsemide today, restart when creatinine stabilizes.   [name] 02/29/2012 7:44 AM

## 2012-02-29 NOTE — Progress Notes (Signed)
Covering Clinical Child psychotherapist (CSW) visited pt room and informed her that [name] is unable to offer placement for pt. CSW explored whether pt would be agreeable to another facility. Pt stated she would be agreeable to another facility and has consented for CSW to do a new SNF search. CSW will follow up with pt later today with bed offers.  CSW has also left a message for [facility] to inquire whether a bed has become available today.  Theresia Bough, MSW, Theresia Majors [phone]

## 2012-02-29 NOTE — Progress Notes (Signed)
I have seen and examined this patient. I have discussed with Dr Bradshaw.  I agree with their findings and plans as documented in their progress note.    
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
## 2012-03-01 LAB — CBC
Platelets: 212 10*3/uL (ref 150–400)
RBC: 2.96 MIL/uL — ABNORMAL LOW (ref 3.87–5.11)

## 2012-03-01 LAB — GLUCOSE, CAPILLARY
Glucose-Capillary: 135 mg/dL — ABNORMAL HIGH (ref 70–99)
Glucose-Capillary: 115 mg/dL — ABNORMAL HIGH (ref 70–99)

## 2012-03-01 LAB — BASIC METABOLIC PANEL
CO2: 30 mEq/L (ref 19–32)
Chloride: 91 mEq/L — ABNORMAL LOW (ref 96–112)
Potassium: 3.9 mEq/L (ref 3.5–5.1)
Sodium: 133 mEq/L — ABNORMAL LOW (ref 135–145)
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
## 2012-03-01 NOTE — Progress Notes (Signed)
I have seen and examined this patient. I have discussed with Dr Selena Batten.  I agree with their findings and plans as documented in their progress note.  Improving diarrhea on Vanc per oral. Transfer to telemetry today Stable baseline creatinine SNF pending

## 2012-03-01 NOTE — Progress Notes (Signed)
Patient ID: [name], female   DOB: 04-09-1930, 79 y.o.   MRN: [id number] Chart reviewed and discussed with Dennie Bible, RN. Blood pressure has improved and BUN and creatinine are starting to stabilize. Will hold torsemide again today. We'll reassess in the morning. No charge today.

## 2012-03-02 LAB — GLUCOSE, CAPILLARY
Glucose-Capillary: 135 mg/dL — ABNORMAL HIGH (ref 70–99)
Glucose-Capillary: 133 mg/dL — ABNORMAL HIGH (ref 70–99)
Glucose-Capillary: 157 mg/dL — ABNORMAL HIGH (ref 70–99)

## 2012-03-02 LAB — CBC
MCH: 28.2 pg (ref 26.0–34.0)
MCHC: 30.5 g/dL (ref 30.0–36.0)
RDW: 16.4 % — ABNORMAL HIGH (ref 11.5–15.5)

## 2012-03-02 LAB — BASIC METABOLIC PANEL
BUN: 72 mg/dL — ABNORMAL HIGH (ref 6–23)
Calcium: 9 mg/dL (ref 8.4–10.5)
GFR calc Af Amer: 23 mL/min — ABNORMAL LOW (ref >90)
GFR calc non Af Amer: 20 mL/min — ABNORMAL LOW (ref >90)
Potassium: 3.3 mEq/L — ABNORMAL LOW (ref 3.5–5.1)
Sodium: 134 mEq/L — ABNORMAL LOW (ref 135–145)

## 2012-03-02 MED ORDER — COLCHICINE 0.6 MG PO TABS
0.6000 mg | ORAL_TABLET | Freq: Once | ORAL | Status: AC
  Administered 2012-03-02: 0.6 mg via ORAL
  Filled 2012-03-02: qty 1

## 2012-03-02 MED ORDER — COLCHICINE 0.6 MG PO TABS
1.2000 mg | ORAL_TABLET | Freq: Once | ORAL | Status: AC
  Administered 2012-03-02: 1.2 mg via ORAL
  Filled 2012-03-02: qty 2

## 2012-03-02 MED ORDER — POTASSIUM CHLORIDE CRYS ER 20 MEQ PO TBCR
40.0000 meq | EXTENDED_RELEASE_TABLET | Freq: Once | ORAL | Status: AC
  Administered 2012-03-02: 40 meq via ORAL
  Filled 2012-03-02: qty 2

## 2012-03-02 NOTE — Progress Notes (Signed)
PROGRESS NOTE  Subjective:   Mrs. Alleman is a 79 year old female with a history of diastolic dysfunction, diabetes mellitus, hyperlipidemia, , obstructive sleep apnea, chronic kidney disease, C. difficile diarrhea.  She was transferred from the CCU yesterday to 3 W.  She has been made a DO NOT RESUSCITATE and does not want pressors.  She complains of right hand pain and sore legs. She thinks her right hand is swollen. She has an IV in her right hand. She denies dyspnea.  Objective:    Vital Signs:   Temp:  [97.8 F (36.6 C)-98.7 F (37.1 C)] 97.8 F (36.6 C) (01/19 0500) Pulse Rate:  [66-85] 69  (01/19 0500) Resp:  [18-20] 18  (01/19 0500) BP: (96-127)/(51-65) 108/64 mmHg (01/19 0500) SpO2:  [92 %-100 %] 100 % (01/19 0500) Weight:  [165 lb 8 oz (75.07 kg)-166 lb 14.4 oz (75.705 kg)] 165 lb 8 oz (75.07 kg) (01/19 0500)  Last BM Date: 03/01/12   24-hour weight change: Weight change: 9 lb 7.9 oz (4.305 kg)  Weight trends: Filed Weights   03/01/12 0500 03/01/12 1710 03/02/12 0500  Weight: 157 lb 6.5 oz (71.4 kg) 166 lb 14.4 oz (75.705 kg) 165 lb 8 oz (75.07 kg)    Intake/Output:  01/18 0701 - 01/19 0700 In: 900 [P.O.:900] Out: -      Physical Exam: BP 108/64  Pulse 69  Temp 97.8 F (36.6 C) (Oral)  Resp 18  Ht 5\' 5"  (1.651 m)  Wt 165 lb 8 oz (75.07 kg)  BMI 27.54 kg/m2  SpO2 100%  General: Vital signs reviewed and noted. An elderly female in no acute distress   Head: Normocephalic, atraumatic.  Eyes: conjunctivae/corneas clear.  EOM's intact.   Throat: normal  Neck:  normal   Lungs:   a few basilar rales   Heart:  irregularly irregular  Abdomen:  Soft, non-tender, non-distended    Extremities: Chronic stasis, no edema    Neurologic: A&O X3, CN II - XII are grossly intact.   Psych: Normal     Labs: BMET:  Basename 03/02/12 0605 03/01/12 0500  NA 134* 133*  K 3.3* 3.9  CL 92* 91*  CO2 31 30  GLUCOSE 105* 78  BUN 72* 71*  CREATININE 2.23* 2.43*    CALCIUM 9.0 9.3  MG -- --  PHOS -- --    Liver function tests: No results found for this basename: AST:2,ALT:2,ALKPHOS:2,BILITOT:2,PROT:2,ALBUMIN:2 in the last 72 hours No results found for this basename: LIPASE:2,AMYLASE:2 in the last 72 hours  CBC:  Basename 03/02/12 0605 03/01/12 0500  WBC 7.7 9.6  NEUTROABS -- --  HGB 8.4* 8.2*  HCT 27.5* 27.0*  MCV 92.3 91.2  PLT 242 212    Cardiac Enzymes: No results found for this basename: CKTOTAL:4,CKMB:4,TROPONINI:4 in the last 72 hours  Coagulation Studies: No results found for this basename: LABPROT:5,INR:5 in the last 72 hours   Other results:  Tele:  Atrial fib with intermittent pacing  Medications:    Infusions:    Scheduled Medications:    . acetaminophen  650 mg Oral TID  . alteplase  6 mg Intracatheter Once  . antiseptic oral rinse  15 mL Mouth Rinse BID  . aspirin  81 mg Oral Daily  . atorvastatin  10 mg Oral Daily  . carvedilol  12.5 mg Oral BID WC  . diltiazem  120 mg Oral Daily  . docusate sodium  100 mg Oral Daily  . DULoxetine  30 mg Oral Daily  .  enoxaparin  30 mg Subcutaneous Q24H  . feeding supplement  237 mL Oral TID BM  . ferrous sulfate  325 mg Oral BID  . insulin aspart  0-5 Units Subcutaneous QHS  . insulin aspart  0-9 Units Subcutaneous TID WC  . multivitamin with minerals  1 tablet Oral Daily  . pantoprazole  40 mg Oral Q0600  . potassium chloride  40 mEq Oral Once  . sodium chloride  3 mL Intravenous Q12H  . vancomycin  125 mg Oral Q6H    Assessment/ Plan:    Acute on chronic diastolic heart failure (02/19/2012)  appears to be stable for now. Continue current medication.   Chronic kidney disease (CKD), stage IV (severe) (07/20/2011)  her creatinine is trending down slowly. Further plans per the medicine team.   Wide-complex tachycardia (02/19/2012)    Disposition:  Length of Stay: 13  Vesta Mixer, Montez Hageman., MD, [facility] 03/02/2012, 8:24 AM Office [phone] Pager [phone]

## 2012-03-02 NOTE — Progress Notes (Signed)
I have seen and examined this patient. I have discussed with Dr Lula Olszewski.  I agree with their findings and plans as documented in their progress note.  Euvolemic appearing Improving C Diff diarrhea. Pain in right hand second digit with overly erythema concerning for gouty flare. Will try trial of Colchicine 1.2 mg once than 0.6 mg one hour later.  Remember to increase newly started Duloxetine to 60 mg daily on 03/05/12 for chronic leg osteoarthritis and back pain.
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
## 2012-03-03 LAB — CBC
HCT: 29.1 % — ABNORMAL LOW (ref 36.0–46.0)
MCH: 28.4 pg (ref 26.0–34.0)
MCV: 91.8 fL (ref 78.0–100.0)
Platelets: 311 10*3/uL (ref 150–400)
RBC: 3.17 MIL/uL — ABNORMAL LOW (ref 3.87–5.11)

## 2012-03-03 LAB — BASIC METABOLIC PANEL
BUN: 70 mg/dL — ABNORMAL HIGH (ref 6–23)
CO2: 31 mEq/L (ref 19–32)
Calcium: 9.3 mg/dL (ref 8.4–10.5)
Chloride: 95 mEq/L — ABNORMAL LOW (ref 96–112)
Creatinine, Ser: 1.9 mg/dL — ABNORMAL HIGH (ref 0.50–1.10)
Glucose, Bld: 139 mg/dL — ABNORMAL HIGH (ref 70–99)

## 2012-03-03 LAB — GLUCOSE, CAPILLARY
Glucose-Capillary: 136 mg/dL — ABNORMAL HIGH (ref 70–99)
Glucose-Capillary: 153 mg/dL — ABNORMAL HIGH (ref 70–99)
Glucose-Capillary: 243 mg/dL — ABNORMAL HIGH (ref 70–99)

## 2012-03-03 MED ORDER — GERHARDT'S BUTT CREAM: 1.0000 "application " | TOPICAL_CREAM | Freq: Two times a day (BID) | CUTANEOUS | Status: DC | PRN

## 2012-03-03 MED ORDER — ACETAMINOPHEN 325 MG PO TABS: 650.0000 mg | ORAL_TABLET | Freq: Three times a day (TID) | ORAL | Status: DC

## 2012-03-03 MED ORDER — PREDNISONE 20 MG PO TABS
30.0000 mg | ORAL_TABLET | Freq: Every day | ORAL | Status: DC
  Administered 2012-03-03 – 2012-03-04 (×2): 30 mg via ORAL
  Filled 2012-03-03 (×3): qty 1

## 2012-03-03 MED ORDER — PREDNISONE 10 MG PO TABS: 30.0000 mg | ORAL_TABLET | Freq: Every day | ORAL | Status: DC

## 2012-03-03 MED ORDER — ASPIRIN 81 MG PO CHEW: 81.0000 mg | CHEWABLE_TABLET | Freq: Every day | ORAL | Status: DC

## 2012-03-03 MED ORDER — TORSEMIDE 20 MG PO TABS: 80.0000 mg | ORAL_TABLET | Freq: Every day | ORAL | Status: DC

## 2012-03-03 MED ORDER — DULOXETINE HCL 30 MG PO CPEP: 30.0000 mg | ORAL_CAPSULE | Freq: Every day | ORAL | Status: DC

## 2012-03-03 MED ORDER — TORSEMIDE 20 MG PO TABS
80.0000 mg | ORAL_TABLET | Freq: Every day | ORAL | Status: DC
  Administered 2012-03-03 – 2012-03-04 (×2): 80 mg via ORAL
  Filled 2012-03-03 (×3): qty 4

## 2012-03-03 MED ORDER — VANCOMYCIN 50 MG/ML ORAL SOLUTION: 125.0000 mg | Freq: Four times a day (QID) | ORAL | Status: DC

## 2012-03-03 NOTE — Progress Notes (Signed)
Patient ID: [name], female   DOB: 05-Nov-1930, 79 y.o.   MRN: [id number]      Cardiology Progress Note Patient Name: [name] Date of Encounter: 03/03/2012, 11:45 AM     Subjective  Patient continues to have diarrhea periodically.  Still with leg pain and right wrist pain.  Creatinine improved.  She has been off diuretics for several days now.  RHC Done on dopamine 10mcg/kg/min  RA = 15  RV = 52/12/15  PA = 54/27 (37)  PCW = 20  Fick cardiac output/index = 4.5/2.4  Thermo CO/CI = 3.2/1.7  PVR = 5.4 [place] (Thermo)  PA sat = 30%, 32%  FA sat = 89% on 4L O2    Objective   Telemetry: A.Fib 60-80s     . acetaminophen  650 mg Oral TID  . alteplase  6 mg Intracatheter Once  . antiseptic oral rinse  15 mL Mouth Rinse BID  . aspirin  81 mg Oral Daily  . atorvastatin  10 mg Oral Daily  . carvedilol  12.5 mg Oral BID WC  . diltiazem  120 mg Oral Daily  . docusate sodium  100 mg Oral Daily  . DULoxetine  30 mg Oral Daily  . enoxaparin  30 mg Subcutaneous Q24H  . feeding supplement  237 mL Oral TID BM  . ferrous sulfate  325 mg Oral BID  . insulin aspart  0-5 Units Subcutaneous QHS  . insulin aspart  0-9 Units Subcutaneous TID WC  . multivitamin with minerals  1 tablet Oral Daily  . pantoprazole  40 mg Oral Q0600  . sodium chloride  3 mL Intravenous Q12H  . torsemide  80 mg Oral Daily  . vancomycin  125 mg Oral Q6H      Physical Exam: Temp:  [98.2 F (36.8 C)-98.8 F (37.1 C)] 98.2 F (36.8 C) (01/20 0500) Pulse Rate:  [72-76] 75  (01/20 0500) Resp:  [16-18] 18  (01/20 0500) BP: (116-143)/(58-77) 143/77 mmHg (01/20 0953) SpO2:  [91 %-97 %] 91 % (01/20 0500) Weight:  [163 lb (73.936 kg)] 163 lb (73.936 kg) (01/20 0500)  General: Chronically ill appearing elderly female, in no acute distress..  Neck: Supple. Negative for carotid bruits. JVP 10 cm Lungs: Bibasilar rales. No wheezes or rhonchi. Breathing is unlabored on 5L O2. Heart: Irregular S1 S2 without  murmurs, rubs, or gallops.  Abdomen: Soft, non-tender, non-distended with normoactive bowel sounds. No rebound/guarding. No obvious abdominal masses. Msk:  Strength and tone appear normal for age. Extremities: Trace BLE edema. Chronic skin discoloration changes to BLE L>R. BLE tender to touch. Distal pedal pulses are intact and equal bilaterally. Neuro: Alert and oriented X 3. Moves all extremities spontaneously. Psych:  Responds to questions appropriately with a normal affect.   Intake/Output Summary (Last 24 hours) at 03/03/12 1145 Last data filed at 03/03/12 0859  Gross per 24 hour  Intake    240 ml  Output      0 ml  Net    240 ml    Labs:  [facility] 03/03/12 0530 03/02/12 0605  NA 138 134*  K 4.0 3.3*  CL 95* 92*  CO2 31 31  GLUCOSE 139* 105*  BUN 70* 72*  CREATININE 1.90* 2.23*  CALCIUM 9.3 9.0  MG -- --  PHOS -- --   Basename 02/18/12 1629  AST 16  ALT 11  ALKPHOS 111  BILITOT 0.6  PROT 7.4  ALBUMIN 2.9*    Basename 03/03/12 0530 03/02/12  0605  WBC 7.6 7.7  NEUTROABS -- --  HGB 9.0* 8.4*  HCT 29.1* 27.5*  MCV 91.8 92.3  PLT 311 242   Basename 02/19/12 0840 02/19/12 0149 02/18/12 2037  TROPONINI <0.30 <0.30 <0.30   Basename 02/19/12 0149  CHOL 124  HDL 36*  LDLCALC 71  TRIG 87  CHOLHDL 3.4   Basename 02/19/12 0149  VITAMINB12 840  FOLATE 7.5  FERRITIN 798*  TIBC Not calculated due to Iron <10.  IRON <10*  RETICCTPCT 1.8    Radiology/Studies:   02/19/2012 - CHEST - LEFT DECUBITUS   Findings: In left decubitus positioning, and there is layering partially of pleural effusion along the dependent thorax.  With fluid immobilization, there remains evidence of opacity in the retrocardiac left lower lobe suggesting airspace infiltrate.  IMPRESSION: At least partially free-flowing small pleural effusion.  Left lower lobe infiltrate also identified.   02/19/2012 -  PORTABLE CHEST - 1 VIEW   Findings: Similar appearance of the dense left basilar opacity  likely left pleural effusion.  Unchanged cardiomegaly with left atrial enlargement.  Probable enlargement of the main pulmonary artery.  Slightly increased pulmonary vascular congestion compared to yesterday.  Right subclavian approach cardiac rhythm maintenance device again noted.  No acute osseous abnormality.  IMPRESSION:  1.  Slightly increased pulmonary vascular congestion. 2.  Similar appearance of dense left basilar opacity with pleural effusion. 3.  Stable cardiomegaly with left atrial and likely pulmonary arterial enlargement       Assessment and Plan  79 y.o. female w/ PMHx significant for diastolic HF in setting of hypertrophic cardiomyopathy, chronic afib with sick sinus syndrome s/p [employer] PPM (not an anticoag candidate), CKD Stage IV (Cr baseline 2.1-2.3), nonobstructive CAD (per cath 2007), Chronic Respiratory Failure (chronic O2 @ 2L), CVA, HTN, DM, HLD and depression who presented [facility] with shortness of breath, volume overload, ? Aspiration, and wide complex tachycardia.  1. Atrial fibrillation: Chronic, rate is controlled on Coreg and diltiazem CD.  She is no longer on warfarin due to falls and recent GI bleed.  She is on ASA 81.  2. Acute on chronic diastolic CHF:  RHC numbers looked like right heart failure out of proportion to left heart failure with a component of pulmonary arterial HTN.  I suspect this may be due to perhaps OHS and hypoxemia over a long period (she has been on home oxygen for years).  She diuresed well with dopamine/Lasix, now off.  She has been off diuresis several days with AKI in the setting of C difficile diarrhea.  Creatinine trending down.  JVP is up.  Will start torsemide 80 mg daily today (had been on a stable dose of 100 mg daily at home - though decreased after her previous hospitalization).   3. C difficile diarrhea: Treatment per primary team with po vancomycin. 4. Renal: Stabilizing.  As above, will start torsemide 80 mg  daily. 5. When she is discharged to SNF, she will need close cardiology followup.   [name] 03/03/2012 11:45 AM
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
## 2012-03-03 NOTE — Progress Notes (Signed)
Physical Therapy Treatment Patient Details Name: [name] MRN: [id number] DOB: 22-Oct-1930 Today's Date: 03/03/2012 Time: 4098-1191 PT Time Calculation (min): 27 min  PT Assessment / Plan / Recommendation Comments on Treatment Session  Pt making slow progress toward her goals. Incr time for processing instructions and max cues for safety with transfers today. Pt with sig forward flexion upon intitial standing on first rep, with cues was able to come into full upright posture and maintain with min assist and cues.    Follow Up Recommendations  SNF           Equipment Recommendations  None recommended by PT       Frequency Min 3X/week   Plan Discharge plan remains appropriate;Frequency remains appropriate    Precautions / Restrictions Precautions Precautions: Fall Precaution Comments: contact for c-diff    Pertinent Vitals/Pain 5-6/10 in right hand and legs. Premedicated    Mobility  Bed Mobility Supine to Sit: 5: Supervision Sitting - Scoot to Edge of Bed: 5: Supervision Details for Bed Mobility Assistance: cues for technique/use of arms to assist with mobility Transfers Sit to Stand: 3: Mod assist;From bed;With upper extremity assist Sit to Stand: Patient Percentage: 70% Stand to Sit: 3: Mod assist;With armrests;To bed;To chair/3-in-1 Stand to Sit: Patient Percentage: 60% (assist to control descent to bed x2 and chair x1) Stand Pivot Transfers: 2: Max assist Stand Pivot Transfers: Patient Percentage: 50% Details for Transfer Assistance: max cues (verbal/tactile) for hand placement, posture and transfer techniques. Repeated sit/stand x3 for strenghening and education on safety with transfers.    Exercises General Exercises - Lower Extremity Ankle Circles/Pumps: AROM;Both;10 reps;Supine Quad Sets: AROM;Strengthening;Both;10 reps;Supine Long Arc Quad: AROM;Strengthening;Both;10 reps;Seated Toe Raises: AROM;Strengthening;10 reps;Seated;Both Heel Raises:  AROM;Strengthening;Both;10 reps;Seated     PT Goals Acute Rehab PT Goals PT Goal: Supine/Side to Sit - Progress: Progressing toward goal PT Goal: Sit to Stand - Progress: Progressing toward goal PT Goal: Stand to Sit - Progress: Progressing toward goal PT Transfer Goal: Bed to Chair/Chair to Bed - Progress: Progressing toward goal PT Goal: Perform Home Exercise Program - Progress: Progressing toward goal  Visit Information  Last PT Received On: 03/03/12 Assistance Needed: +2    Subjective Data  Subjective: Still with hand and leg pain, "I have gout and it's acting out". Agreeable to therapy and out of bed, "I'll try".   Cognition  Overall Cognitive Status: Impaired Area of Impairment: Problem solving Arousal/Alertness: Awake/alert Orientation Level: Appears intact for tasks assessed Behavior During Session: [facility] for tasks performed Safety/Judgement: Decreased safety judgement for tasks assessed Problem Solving: difficulty and increased time/cues for problem solving sit/stand and stand/pivot transfers to perfom safely.       End of Session PT - End of Session Equipment Utilized During Treatment: Gait belt Activity Tolerance: Patient limited by fatigue;Patient limited by pain Patient left: in chair;with call bell/phone within reach Nurse Communication: Mobility status   GP     Sallyanne Kuster 03/03/2012, 10:03 AM  Sallyanne Kuster, PTA Office- [phone]

## 2012-03-03 NOTE — Progress Notes (Addendum)
Spoke with Dr. Ermalinda Memos regarding pt's cancelled discharge. Ok to leave IV out for now, but place back on heart monitor. Order carried out.

## 2012-03-03 NOTE — Progress Notes (Signed)
I examined this patient and discussed the care plan with Dr Bradshaw and the FPTS team and agree with assessment and plan as documented in the progress note above.  

## 2012-03-03 NOTE — Progress Notes (Signed)
Clinical Social Worker received notification that pt does not currently have a Passr.  CSW staffed case with Chiropodist.  CSW updated RN, MD, and SNF.    Angelia Mould, MSW, [place] [phone]

## 2012-03-04 LAB — CBC
Platelets: 373 10*3/uL (ref 150–400)
RBC: 3.42 MIL/uL — ABNORMAL LOW (ref 3.87–5.11)
WBC: 7.7 10*3/uL (ref 4.0–10.5)

## 2012-03-04 LAB — BASIC METABOLIC PANEL
CO2: 32 mEq/L (ref 19–32)
Chloride: 95 mEq/L — ABNORMAL LOW (ref 96–112)
Potassium: 4 mEq/L (ref 3.5–5.1)
Sodium: 139 mEq/L (ref 135–145)

## 2012-03-04 LAB — GLUCOSE, CAPILLARY: Glucose-Capillary: 133 mg/dL — ABNORMAL HIGH (ref 70–99)

## 2012-03-04 MED ORDER — GLIMEPIRIDE 2 MG PO TABS: 2.0000 mg | ORAL_TABLET | Freq: Every day | ORAL | Status: DC

## 2012-03-04 NOTE — Progress Notes (Signed)
I examined this patient and discussed the care plan with Dr Ermalinda Memos and the [facility] team and agree with assessment and plan as documented in the progress note above.
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
## 2012-03-04 NOTE — Progress Notes (Signed)
Patient ID: [name], female   DOB: Jan 31, 1931, 78 y.o.   MRN: [id number]      Cardiology Progress Note Patient Name: [name] Date of Encounter: 03/04/2012, 8:39 AM     Subjective  Patient continues to have diarrhea periodically.  Still with leg pain and right wrist pain.  Creatinine improved again.  Torsemide restarted, weight is down a pound today.   RHC Done on dopamine 61mcg/kg/min  RA = 15  RV = 52/12/15  PA = 54/27 (37)  PCW = 20  Fick cardiac output/index = 4.5/2.4  Thermo CO/CI = 3.2/1.7  PVR = 5.4 [place] (Thermo)  PA sat = 30%, 32%  FA sat = 89% on 4L O2    Objective   Telemetry: A.Fib 60-80s     . acetaminophen  650 mg Oral TID  . alteplase  6 mg Intracatheter Once  . antiseptic oral rinse  15 mL Mouth Rinse BID  . aspirin  81 mg Oral Daily  . atorvastatin  10 mg Oral Daily  . carvedilol  12.5 mg Oral BID WC  . diltiazem  120 mg Oral Daily  . docusate sodium  100 mg Oral Daily  . DULoxetine  30 mg Oral Daily  . enoxaparin  30 mg Subcutaneous Q24H  . feeding supplement  237 mL Oral TID BM  . ferrous sulfate  325 mg Oral BID  . insulin aspart  0-5 Units Subcutaneous QHS  . insulin aspart  0-9 Units Subcutaneous TID WC  . multivitamin with minerals  1 tablet Oral Daily  . pantoprazole  40 mg Oral Q0600  . predniSONE  30 mg Oral Q breakfast  . sodium chloride  3 mL Intravenous Q12H  . torsemide  80 mg Oral Q breakfast  . vancomycin  125 mg Oral Q6H      Physical Exam: Temp:  [98 F (36.7 C)-98.2 F (36.8 C)] 98 F (36.7 C) (01/21 0645) Pulse Rate:  [76-82] 76  (01/21 0645) Resp:  [17-18] 18  (01/21 0645) BP: (128-147)/(51-77) 147/71 mmHg (01/21 0645) SpO2:  [96 %-100 %] 96 % (01/21 0645) Weight:  [162 lb (73.483 kg)] 162 lb (73.483 kg) (01/21 0645)  General: Chronically ill appearing elderly female, in no acute distress..  Neck: Supple. Negative for carotid bruits. JVP 8 cm Lungs: Bibasilar rales. No wheezes or rhonchi. Breathing is  unlabored on 5L O2. Heart: Irregular S1 S2 without murmurs, rubs, or gallops.  Abdomen: Soft, non-tender, non-distended with normoactive bowel sounds. No rebound/guarding. No obvious abdominal masses. Msk:  Strength and tone appear normal for age. Extremities: Trace BLE edema. Chronic skin discoloration changes to BLE L>R. BLE tender to touch. Distal pedal pulses are intact and equal bilaterally. Neuro: Alert and oriented X 3. Moves all extremities spontaneously. Psych:  Responds to questions appropriately with a normal affect.   Intake/Output Summary (Last 24 hours) at 03/04/12 0839 Last data filed at 03/04/12 0700  Gross per 24 hour  Intake    840 ml  Output      0 ml  Net    840 ml    Labs:  [facility] 03/04/12 0604 03/03/12 0530  NA 139 138  K 4.0 4.0  CL 95* 95*  CO2 32 31  GLUCOSE 177* 139*  BUN 58* 70*  CREATININE 1.64* 1.90*  CALCIUM 9.6 9.3  MG -- --  PHOS -- --   Basename 02/18/12 1629  AST 16  ALT 11  ALKPHOS 111  BILITOT 0.6  PROT  7.4  ALBUMIN 2.9*    Basename 03/04/12 0604 03/03/12 0530  WBC 7.7 7.6  NEUTROABS -- --  HGB 9.4* 9.0*  HCT 31.5* 29.1*  MCV 92.1 91.8  PLT 373 311   Basename 02/19/12 0840 02/19/12 0149 02/18/12 2037  TROPONINI <0.30 <0.30 <0.30   Basename 02/19/12 0149  CHOL 124  HDL 36*  LDLCALC 71  TRIG 87  CHOLHDL 3.4   Basename 02/19/12 0149  VITAMINB12 840  FOLATE 7.5  FERRITIN 798*  TIBC Not calculated due to Iron <10.  IRON <10*  RETICCTPCT 1.8    Radiology/Studies:   02/19/2012 - CHEST - LEFT DECUBITUS   Findings: In left decubitus positioning, and there is layering partially of pleural effusion along the dependent thorax.  With fluid immobilization, there remains evidence of opacity in the retrocardiac left lower lobe suggesting airspace infiltrate.  IMPRESSION: At least partially free-flowing small pleural effusion.  Left lower lobe infiltrate also identified.   02/19/2012 -  PORTABLE CHEST - 1 VIEW   Findings:  Similar appearance of the dense left basilar opacity likely left pleural effusion.  Unchanged cardiomegaly with left atrial enlargement.  Probable enlargement of the main pulmonary artery.  Slightly increased pulmonary vascular congestion compared to yesterday.  Right subclavian approach cardiac rhythm maintenance device again noted.  No acute osseous abnormality.  IMPRESSION:  1.  Slightly increased pulmonary vascular congestion. 2.  Similar appearance of dense left basilar opacity with pleural effusion. 3.  Stable cardiomegaly with left atrial and likely pulmonary arterial enlargement       Assessment and Plan  79 y.o. female w/ PMHx significant for diastolic HF in setting of hypertrophic cardiomyopathy, chronic afib with sick sinus syndrome s/p [employer] PPM (not an anticoag candidate), CKD Stage IV (Cr baseline 2.1-2.3), nonobstructive CAD (per cath 2007), Chronic Respiratory Failure (chronic O2 @ 2L), CVA, HTN, DM, HLD and depression who presented to [facility] on 02/18/12 with shortness of breath, volume overload, ? Aspiration, and wide complex tachycardia.  1. Atrial fibrillation: Chronic, rate is controlled on Coreg and diltiazem CD.  She is no longer on warfarin due to falls and recent GI bleed.  She is on ASA 81.  2. Acute on chronic diastolic CHF:  RHC numbers looked like right heart failure out of proportion to left heart failure with a component of pulmonary arterial HTN.  I suspect this may be due to perhaps OHS and hypoxemia over a long period (she has been on home oxygen for years).  She diuresed well with dopamine/Lasix, now off.  Torsemide po restarted, creatinine has decreased.  3. C difficile diarrhea: Treatment per primary team with po vancomycin. 4. Renal: Stabilizing.  Tolerating po torsemide with no problems. 5. When she is discharged to SNF, she will need close cardiology followup.   [name] 03/04/2012 8:39 AM

## 2012-03-04 NOTE — Progress Notes (Signed)
Occupational Therapy Treatment Patient Details Name: [name] MRN: [id number] DOB: Mar 23, 1930 Today's Date: 03/04/2012 Time: 2956-2130 OT Time Calculation (min): 31 min  OT Assessment / Plan / Recommendation Comments on Treatment Session PT currently continues to have dark brown loose stool during session. Recommend if ambulating to wear brief due to urgency to void bowels. Pt with redness and early signs of skin break down due to voiding bowels. PT progressing and able to static stand min guard this session . Pt very pleased with performance during session    Follow Up Recommendations  SNF;Supervision/Assistance - 24 hour    Barriers to Discharge       Equipment Recommendations  3 in 1 bedside comode    Recommendations for Other Services    Frequency Min 2X/week   Plan Discharge plan needs to be updated    Precautions / Restrictions Precautions Precautions: Fall Precaution Comments: contact for c-diff Restrictions Weight Bearing Restrictions: No   Pertinent Vitals/Pain No pain Urgency with bowels    ADL  Eating/Feeding: Modified independent Where Assessed - Eating/Feeding: Chair Grooming: Wash/dry face;Set up Where Assessed - Grooming: Supported sitting Upper Body Dressing: Set up Where Assessed - Upper Body Dressing: Supported sitting Lower Body Dressing: +1 Total assistance Where Assessed - Lower Body Dressing: Supported standing Toilet Transfer: Minimal assistance Toilet Transfer Method: Surveyor, minerals: Raised toilet seat with arms (or 3-in-1 over toilet) Toileting - Clothing Manipulation and Hygiene: Minimal assistance Where Assessed - Engineer, mining and Hygiene: Sit to stand from 3-in-1 or toilet Equipment Used: Gait belt Transfers/Ambulation Related to ADLs: PT stand pivot to the Right and the Left during session with Min (A). PT reaching for environmental supports ADL Comments: Pt incontinent of bowels on arrival. Pt  unable to control void due to Cdiff but recognizes the urge to start. PT completed bed mobility Rt and LT for hygiene. Pt transfered to 3n1 to void again after supine hygiene. Pt able to static stand using high back chair Min guard. pt yells "I am standing I am standing feels good" "ill stand here all day if you want me to" PT very pleased with ability to static stand. PT standing while OT performs hygiene. Pt with large red areas at sacrum and bil thighs from void of bowels. Pt provided aleo lotion and barrier cream RN notifed and sign placed outside patient door as a reminder that high risk for skin breakdown and need to change positions in 2 hours. Pt positioned in chair. Pt smiling and states "this feels better " at end of session    OT Diagnosis:    OT Problem List:   OT Treatment Interventions:     OT Goals Acute Rehab OT Goals OT Goal Formulation: With patient Time For Goal Achievement: 03/05/12 Potential to Achieve Goals: Good ADL Goals Pt Will Perform Grooming: with modified independence;Standing at sink ADL Goal: Grooming - Progress: Progressing toward goals Pt Will Perform Upper Body Bathing: Independently;Sitting, chair;Sitting, edge of bed ADL Goal: Upper Body Bathing - Progress: Progressing toward goals Pt Will Perform Lower Body Bathing: with modified independence;Sit to stand from chair;Sit to stand from bed Pt Will Perform Upper Body Dressing: Independently;Sitting, chair;Sitting, bed ADL Goal: Upper Body Dressing - Progress: Progressing toward goals Pt Will Perform Lower Body Dressing: with modified independence;Sit to stand from chair;Sit to stand from bed ADL Goal: Lower Body Dressing - Progress: Progressing toward goals Pt Will Transfer to Toilet: with modified independence;Ambulation;with DME;Comfort height toilet ADL Goal: Toilet  Transfer - Progress: Progressing toward goals Pt Will Perform Toileting - Clothing Manipulation: with modified independence;Sitting on 3-in-1  or toilet;Standing ADL Goal: Toileting - Clothing Manipulation - Progress: Progressing toward goals Pt Will Perform Toileting - Hygiene: with modified independence;Sit to stand from 3-in-1/toilet ADL Goal: Toileting - Hygiene - Progress: Progressing toward goals  Visit Information  Last OT Received On: 03/04/12 Assistance Needed: +1 (+2 for ambulation, can static stand Min Guard with RW)    Subjective Data      Prior Functioning       Cognition  Overall Cognitive Status: Appears within functional limits for tasks assessed/performed Arousal/Alertness: Awake/alert Orientation Level: Appears intact for tasks assessed Behavior During Session: [facility] for tasks performed Cognition - Other Comments: pt appears very appropriate this session and ready for d/c to SNF. Pt oriented to morning events and reporting personal care needs accurately.     Mobility  Shoulder Instructions Bed Mobility Supine to Sit: 5: Supervision;HOB elevated;With rails Sitting - Scoot to Edge of Bed: 5: Supervision;With rail Details for Bed Mobility Assistance: pt requires x2 attempts for supine <>sit however able to complete S levle Transfers Transfers: Sit to Stand;Stand to Sit Sit to Stand: 4: Min assist;With upper extremity assist;From bed Stand to Sit: 4: Min assist;With upper extremity assist;To chair/3-in-1 Details for Transfer Assistance: pt able to complete upright posture stand pivot with min v/c to avoid reaching for environmental supports. Pt attempting to reach for chair handles. Pt could benefit from RW next session. Pt able to static stand holding high back chair       Exercises      Balance Static Standing Balance Static Standing - Balance Support: Bilateral upper extremity supported;During functional activity Static Standing - Level of Assistance: 5: Stand by assistance Static Standing - Comment/# of Minutes: ~2 minutes   End of Session OT - End of Session Activity Tolerance: Patient tolerated  treatment well Patient left: in chair;with call bell/phone within reach Nurse Communication: Mobility status;Precautions  GO     Lucile Shutters 03/04/2012, 9:18 AM Pager: [phone]

## 2012-03-04 NOTE — Progress Notes (Signed)
Clinical Optician, dispensing from state.  CSW updated RN and MD.  Facility ready to accept pt.  CSW to phone transport.  CSW to sign off at dc.   Angelia Mould, MSW, [place] [phone]

## 2012-03-05 ENCOUNTER — Encounter: Payer: Self-pay | Admitting: Family Medicine

## 2012-03-05 NOTE — Telephone Encounter (Signed)
error 

## 2012-03-06 NOTE — Telephone Encounter (Signed)
This encounter was created in error - please disregard.

## 2012-03-10 ENCOUNTER — Encounter: Payer: Medicare Other | Admitting: Nurse Practitioner

## 2012-03-12 ENCOUNTER — Ambulatory Visit (INDEPENDENT_AMBULATORY_CARE_PROVIDER_SITE_OTHER): Payer: Medicare Other | Admitting: Nurse Practitioner

## 2012-03-12 ENCOUNTER — Encounter: Payer: Self-pay | Admitting: Nurse Practitioner

## 2012-03-12 VITALS — BP 138/84 | HR 64 | Ht 65.0 in | Wt 163.0 lb

## 2012-03-12 DIAGNOSIS — I5032 Chronic diastolic (congestive) heart failure: Secondary | ICD-10-CM
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
## 2012-03-12 NOTE — Patient Instructions (Addendum)
Continue with the current medicines  We need to get you a visit with Dr. [name] to go over your medical issues  See Dr. Shirlee Latch in about 4 months  Call the [facility] office at [phone] if you have any questions, problems or concerns.

## 2012-03-25 ENCOUNTER — Encounter: Payer: Self-pay | Admitting: *Deleted

## 2012-03-25 NOTE — Progress Notes (Signed)
This encounter was created in error - please disregard.

## 2012-04-02 ENCOUNTER — Telehealth: Payer: Self-pay | Admitting: Family Medicine

## 2012-04-02 ENCOUNTER — Ambulatory Visit (INDEPENDENT_AMBULATORY_CARE_PROVIDER_SITE_OTHER): Payer: Medicare Other | Admitting: Family Medicine

## 2012-04-02 VITALS — BP 142/92 | HR 125 | Temp 98.4°F | Wt 166.0 lb

## 2012-04-02 DIAGNOSIS — F329 Major depressive disorder, single episode, unspecified: Secondary | ICD-10-CM

## 2012-04-02 DIAGNOSIS — E1165 Type 2 diabetes mellitus with hyperglycemia: Secondary | ICD-10-CM

## 2012-04-02 DIAGNOSIS — I5033 Acute on chronic diastolic (congestive) heart failure: Secondary | ICD-10-CM

## 2012-04-02 DIAGNOSIS — E118 Type 2 diabetes mellitus with unspecified complications: Secondary | ICD-10-CM

## 2012-04-02 DIAGNOSIS — N184 Chronic kidney disease, stage 4 (severe): Secondary | ICD-10-CM

## 2012-04-02 DIAGNOSIS — I4891 Unspecified atrial fibrillation: Secondary | ICD-10-CM

## 2012-04-02 MED ORDER — INSULIN GLARGINE 100 UNIT/ML ~~LOC~~ SOLN: 12.0000 [IU] | Freq: Every day | SUBCUTANEOUS | Status: DC

## 2012-04-02 NOTE — Patient Instructions (Addendum)
Only use the lantus insulin at night, but increase the dose to 12 units.  I prescribed the pen to make it easier.

## 2012-04-02 NOTE — Telephone Encounter (Signed)
Will call tomorrow to give verbal ok for this. Office was closed when I called

## 2012-04-02 NOTE — Telephone Encounter (Signed)
Nurse is calling because there is a delay with scheduling the Occupational Therapy until next week.  They just need a verbal ok.

## 2012-04-03 ENCOUNTER — Telehealth: Payer: Self-pay | Admitting: Family Medicine

## 2012-04-03 ENCOUNTER — Encounter: Payer: Self-pay | Admitting: Family Medicine

## 2012-04-03 DIAGNOSIS — E118 Type 2 diabetes mellitus with unspecified complications: Secondary | ICD-10-CM

## 2012-04-03 MED ORDER — INSULIN GLARGINE 100 UNIT/ML ~~LOC~~ SOLN: 12.0000 [IU] | Freq: Every day | SUBCUTANEOUS | Status: DC

## 2012-04-03 NOTE — Telephone Encounter (Signed)
Spoke with Diane and gave to verbal ok to delay, spoke with Dr. [name] about this

## 2012-04-03 NOTE — Telephone Encounter (Signed)
Approved change
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
## 2012-04-03 NOTE — Assessment & Plan Note (Signed)
Rate controled

## 2012-04-03 NOTE — Assessment & Plan Note (Signed)
She has both diastolic heart failure and rt heart failure.  Seems to be at dry wt currently.

## 2012-04-03 NOTE — Assessment & Plan Note (Signed)
Now on insulin.  Was taking lantus 10 units qhs and rapid acting 3 u with each meal.  Simplified and just given lantus 12 units qhs.

## 2012-04-03 NOTE — Assessment & Plan Note (Signed)
Much improved with improvement in her home situation.

## 2012-04-03 NOTE — Telephone Encounter (Signed)
Called to asked they can switch to Lantus Solostar instead of the Opticlic - pls advise

## 2012-04-03 NOTE — Assessment & Plan Note (Signed)
Stable by last creat.  Will not measure today.  Will not press further diuresis.

## 2012-04-28 ENCOUNTER — Telehealth: Payer: Self-pay | Admitting: Family Medicine

## 2012-04-28 DIAGNOSIS — I498 Other specified cardiac arrhythmias: Secondary | ICD-10-CM

## 2012-04-28 NOTE — Telephone Encounter (Signed)
Patient is calling because she needs refills on all of her med and she wants to use [facility] on [address].  It is easier for her son to be able to pick up all of her meds at this pharmacy.  She is out of her insulin so she needs that right away.

## 2012-04-28 NOTE — Telephone Encounter (Signed)
Please send all medication refill to new pharmacy [facility] [address].

## 2012-04-28 NOTE — Telephone Encounter (Signed)
I first tried finding the pharmacy and could not find any [facility] on [address].  Then, I tried calling patient.  The number was for a nursing facility and patient has been discharged.  I will ask my nurse to investigate how to contact.

## 2012-04-29 MED ORDER — DULOXETINE HCL 30 MG PO CPEP: 30.0000 mg | ORAL_CAPSULE | Freq: Every day | ORAL | Status: DC

## 2012-04-29 MED ORDER — INSULIN GLARGINE 100 UNIT/ML ~~LOC~~ SOLN: 12.0000 [IU] | Freq: Every day | SUBCUTANEOUS | Status: DC

## 2012-04-29 MED ORDER — DILTIAZEM HCL ER COATED BEADS 120 MG PO CP24: 120.0000 mg | ORAL_CAPSULE | Freq: Every day | ORAL | Status: DC

## 2012-04-29 MED ORDER — PANTOPRAZOLE SODIUM 40 MG PO TBEC: 40.0000 mg | DELAYED_RELEASE_TABLET | Freq: Every day | ORAL | Status: DC

## 2012-04-29 MED ORDER — ATORVASTATIN CALCIUM 10 MG PO TABS: 10.0000 mg | ORAL_TABLET | Freq: Every day | ORAL | Status: DC

## 2012-04-29 MED ORDER — CARVEDILOL 12.5 MG PO TABS: 12.5000 mg | ORAL_TABLET | Freq: Two times a day (BID) | ORAL | Status: DC

## 2012-04-29 MED ORDER — TORSEMIDE 20 MG PO TABS: 80.0000 mg | ORAL_TABLET | Freq: Every day | ORAL | Status: DC

## 2012-04-29 MED ORDER — FERROUS SULFATE 325 (65 FE) MG PO TABS: 325.0000 mg | ORAL_TABLET | Freq: Two times a day (BID) | ORAL | Status: DC

## 2012-04-29 NOTE — Telephone Encounter (Signed)
Prescriptions sent to [facility].

## 2012-04-29 NOTE — Telephone Encounter (Signed)
Pt was informed. [name]  

## 2012-04-29 NOTE — Telephone Encounter (Signed)
Dr Carie Caddy to patient,she states it's [facility] at [address]. Kerry Chisolm, [name]

## 2012-04-29 NOTE — Addendum Note (Signed)
Addended by: Tivis Ringer on: 04/29/2012 02:20 PM   Modules accepted: Orders

## 2012-05-02 DIAGNOSIS — I495 Sick sinus syndrome: Secondary | ICD-10-CM

## 2012-05-09 ENCOUNTER — Other Ambulatory Visit: Payer: Self-pay | Admitting: *Deleted

## 2012-05-19 ENCOUNTER — Telehealth: Payer: Self-pay | Admitting: Family Medicine

## 2012-05-19 MED ORDER — TORSEMIDE 20 MG PO TABS: 80.0000 mg | ORAL_TABLET | Freq: Every day | ORAL | Status: DC

## 2012-05-19 NOTE — Telephone Encounter (Signed)
Not routed to Dr. Hensel---Ok to increase quantityfrom #30 to #120?   Gaylene Brooks, RN

## 2012-05-19 NOTE — Telephone Encounter (Signed)
Returned call to patient to inform that Rx quantity has been corrected.  Phone number not correct and patient does not reside at their facility [facility]).  Gaylene Brooks, RN _

## 2012-05-19 NOTE — Telephone Encounter (Signed)
Yes.  120 is appropriate quantity

## 2012-05-19 NOTE — Telephone Encounter (Signed)
Pt only rec'd 30 of her torsemide when she is supposed to take 4 per day - needs this increased

## 2012-05-22 ENCOUNTER — Telehealth: Payer: Self-pay | Admitting: Family Medicine

## 2012-05-22 NOTE — Telephone Encounter (Signed)
Patient is calling because she needs needles for the Lantus Solstar sent to [facility] on [address].

## 2012-05-23 MED ORDER — INSULIN GLARGINE 100 UNIT/ML ~~LOC~~ SOLN: 12.0000 [IU] | Freq: Every day | SUBCUTANEOUS | Status: DC

## 2012-05-23 NOTE — Telephone Encounter (Signed)
Spoke with patient and gave below message 

## 2012-05-23 NOTE — Telephone Encounter (Signed)
Will do!

## 2012-08-01 DIAGNOSIS — I495 Sick sinus syndrome: Secondary | ICD-10-CM

## 2012-08-21 ENCOUNTER — Other Ambulatory Visit: Payer: Self-pay

## 2012-10-15 ENCOUNTER — Ambulatory Visit: Payer: Medicare Other | Admitting: Cardiology

## 2012-10-31 ENCOUNTER — Encounter: Payer: Self-pay | Admitting: Internal Medicine

## 2012-10-31 DIAGNOSIS — I495 Sick sinus syndrome: Secondary | ICD-10-CM

## 2012-11-10 ENCOUNTER — Other Ambulatory Visit: Payer: Self-pay | Admitting: Family Medicine

## 2012-11-10 MED ORDER — ATORVASTATIN CALCIUM 10 MG PO TABS: 10.0000 mg | ORAL_TABLET | Freq: Every day | ORAL | Status: DC

## 2012-12-16 ENCOUNTER — Encounter: Payer: Self-pay | Admitting: Cardiology

## 2012-12-16 ENCOUNTER — Ambulatory Visit (INDEPENDENT_AMBULATORY_CARE_PROVIDER_SITE_OTHER): Payer: Medicare Other | Admitting: Cardiology

## 2012-12-16 VITALS — BP 146/76 | HR 96 | Ht 65.0 in | Wt 146.1 lb

## 2012-12-16 DIAGNOSIS — R0989 Other specified symptoms and signs involving the circulatory and respiratory systems: Secondary | ICD-10-CM

## 2012-12-16 DIAGNOSIS — I5032 Chronic diastolic (congestive) heart failure: Secondary | ICD-10-CM

## 2012-12-16 DIAGNOSIS — N184 Chronic kidney disease, stage 4 (severe): Secondary | ICD-10-CM

## 2012-12-16 DIAGNOSIS — I4891 Unspecified atrial fibrillation: Secondary | ICD-10-CM

## 2012-12-16 DIAGNOSIS — R0609 Other forms of dyspnea: Secondary | ICD-10-CM

## 2012-12-16 DIAGNOSIS — I509 Heart failure, unspecified: Secondary | ICD-10-CM

## 2012-12-16 LAB — BASIC METABOLIC PANEL
BUN: 37 mg/dL — ABNORMAL HIGH (ref 6–23)
CO2: 31 mEq/L (ref 19–32)
Chloride: 99 mEq/L (ref 96–112)
Glucose, Bld: 156 mg/dL — ABNORMAL HIGH (ref 70–99)
Potassium: 3.3 mEq/L — ABNORMAL LOW (ref 3.5–5.1)
Sodium: 140 mEq/L (ref 135–145)

## 2012-12-16 MED ORDER — DILTIAZEM HCL ER COATED BEADS 240 MG PO CP24: 240.0000 mg | ORAL_CAPSULE | Freq: Every day | ORAL | Status: DC

## 2012-12-16 NOTE — Patient Instructions (Signed)
Increase Diltiazem CD to 240mg  daily. You can take 2 of your 120mg  tablets at the same time and use your current supply.  Your physician recommends that you have lab work today--BMET/BNP  Your physician recommends that you schedule a follow-up appointment in: 4 months with Dr Shirlee Latch.

## 2012-12-17 ENCOUNTER — Telehealth: Payer: Self-pay | Admitting: *Deleted

## 2012-12-17 DIAGNOSIS — E876 Hypokalemia: Secondary | ICD-10-CM

## 2012-12-17 MED ORDER — POTASSIUM CHLORIDE ER 10 MEQ PO TBCR: 10.0000 meq | EXTENDED_RELEASE_TABLET | Freq: Every day | ORAL | Status: DC

## 2012-12-17 NOTE — Telephone Encounter (Signed)
Potassium 3.3 on 12/16/12. Discussed with Dawayne Patricia NP and will have patient start Potassium 10 meq daily and recheck labs in 2 weeks. Advised patient, verbalized understanding.

## 2012-12-17 NOTE — Progress Notes (Signed)
Patient ID: [name], female   DOB: [birth year], 79 y.o.   MRN: [id number] PCP: Dr. [name]  79 yo with history of diastolic CHF in the setting of hypertrophic cardiomyopathy, chronic atrial fibrillation, and CKD returns for cardiology evaluation.  She continues to wear home oxygen.  I have not seen her in the office for about a year.  She was admitted in 1/14 with C difficile diarrhea, AKI, and CHF.  She was on dopamine for a period of time.  She was discharged to SNF but is now home living with her daughter.  Her husband passed away this year.    She is symptomatically stable.  She uses a walker to get around the house.  She does not go out except for MD appointments.  She has mild dyspnea walking around her house with is chronic.  No orthopnea, PND, or chest pain. No recent falls.     Labs (2/12): BNP 521, K 4, creatinine 1.8 Labs (7/12): BNP 526 => 244, creatinine 1.9 => 2.2, K 4.1  Labs (8/12): BNP 410, K 3.3, creatinine 2.5 Labs (9/12): K 4.4, creatinine 2.2 Labs (10/12): K 3.6, creatinine 3.1 => 2.5 Labs (11/12): K 4.8, creatinine 2.1 Labs (4/13): BNP 312 Labs (6/13): K 4.4, creatinine 1.88 => 2.9, BUN 50 => 79 Labs (7/13): K 4.9, creatinine 2.3 Labs (1/14): K 4, creatinine 1.64  ECG: Atrial fibrillation with inferolateral T wave inversions  PMH: 1. St.Jude PCM  2. Chronic atrial fibrillation: not on warfarin at this point due to GI bleeding and frequent falls.  3. H/o CVA 4. Depression 5. CKD 6. DM 7. HTN 8. Hyperlipidemia 9. Hypertrophic cardiomyopathy with diastolic CHF: Echo (9/10) with EDF 55-60%, severe asymptomatic septal hypertrophy, no significant LV outflow tract gradient, mild MR, PA systolic pressure 53 mmHg.  Echo (5/13) with EF 50%, focal basal septal hypertrophy, no SAM, posterior hypokinesis, mild-moderate MR, massive LAE, RV mildly dilated with mildly decreased systolic function, PASP 50 mmHg.  Echo (1/14) with EF 55-60%, focal basal septal hypertrophy,  mild-moderate MR, severe biatrial enlargement, PA systolic pressure 54 mmHg.  RHC (1/14) with mean RA 15, PA 54/37, mean PCWP 20, CI 2.4, PVR 5.4.  10.  Nonobstructive CAD: 6/07 cath with luminal irregularities.  11. Chronic respiratory failure on home oxygen.   SH: Widow, has 7 children.  Never smoked.  Lives in [place] with one of her daughters.  FH: CAD, CVA  ROS: All systems reviewed and negative except as per HPI.   Current Outpatient Prescriptions  Medication Sig Dispense Refill  . acetaminophen (TYLENOL) 325 MG tablet Take 2 tablets (650 mg total) by mouth 3 (three) times daily.  180 tablet  0  . atorvastatin (LIPITOR) 10 MG tablet Take 1 tablet (10 mg total) by mouth daily.  90 tablet  0  . carvedilol (COREG) 12.5 MG tablet Take 1 tablet (12.5 mg total) by mouth 2 (two) times daily with a meal.  60 tablet  12  . DULoxetine (CYMBALTA) 30 MG capsule Take 1 capsule (30 mg total) by mouth daily.  30 capsule  12  . ferrous sulfate 325 (65 FE) MG tablet Take 325 mg by mouth daily with breakfast.      . insulin glargine (LANTUS SOLOSTAR) 100 UNIT/ML injection Inject 0.12 mLs (12 Units total) into the skin at bedtime.  5 pen  12  . NON FORMULARY Oxygen; 2 L, Dx 428.22 86% on RA       . pantoprazole (PROTONIX) 40 MG  tablet Take 1 tablet (40 mg total) by mouth daily at 6 (six) AM.  30 tablet  12  . torsemide (DEMADEX) 20 MG tablet Take 4 tablets (80 mg total) by mouth daily with breakfast.  120 tablet  12  . diltiazem (CARDIZEM CD) 240 MG 24 hr capsule Take 1 capsule (240 mg total) by mouth daily.  30 capsule  6   No current facility-administered medications for this visit.    BP 146/76  Pulse 96  Ht 5\' 5"  (1.651 m)  Wt 66.28 kg (146 lb 1.9 oz)  BMI 24.32 kg/m2  SpO2 87% General: NAD Neck: JVP 8 cm, no thyromegaly or thyroid nodule.  Lungs: Slight crackles at bases bilaterally.   CV: Nondisplaced PMI.  Heart irregular S1/S2, no S3/S4, 1/6 systolic murmur along the sternal border. No  edema. No carotid bruit.   Abdomen: Soft, nontender, no hepatosplenomegaly, no distention.  Neurologic: Alert and oriented x 3.  Psych: Depressed affect. Extremities: No clubbing or cyanosis.   Assessment/Plan: 1. Hypertrophic cardiomyopathy: She has not had significant outflow tract obstruction on her recent echoes, but she does have diastolic CHF.  Continue beta blocker and calcium channel blocker as these may be limiting outflow tract obstruction.  2. Chronic diastolic CHF: She is not significantly volume overloaded on exam.  She has chronic respiratory failure on home oxygen.   - Continue current torsemide dose.  - BMET/BNP today.  3. CKD: Will get BMET today.  4. Deconditioning: I suggested home PT but she does not want this.  I asked her to try to at least walk more around her house.  5. Atrial fibrillation: Chronic.  Rate controlled with Coreg and diltiazem CD.  She is not on anticoagulation due to frequent falls in the past and history of GI bleeding.   [name] 12/17/2012

## 2012-12-18 ENCOUNTER — Other Ambulatory Visit: Payer: Self-pay

## 2012-12-19 ENCOUNTER — Ambulatory Visit (INDEPENDENT_AMBULATORY_CARE_PROVIDER_SITE_OTHER): Payer: Medicare Other | Admitting: Family Medicine

## 2012-12-19 ENCOUNTER — Encounter: Payer: Self-pay | Admitting: Family Medicine

## 2012-12-19 ENCOUNTER — Ambulatory Visit: Payer: Medicare Other | Admitting: Cardiology

## 2012-12-19 VITALS — BP 136/73 | HR 92 | Ht 65.0 in | Wt 148.9 lb

## 2012-12-19 DIAGNOSIS — I509 Heart failure, unspecified: Secondary | ICD-10-CM

## 2012-12-19 DIAGNOSIS — Z23 Encounter for immunization: Secondary | ICD-10-CM

## 2012-12-19 DIAGNOSIS — I5032 Chronic diastolic (congestive) heart failure: Secondary | ICD-10-CM

## 2012-12-19 DIAGNOSIS — E1149 Type 2 diabetes mellitus with other diabetic neurological complication: Secondary | ICD-10-CM

## 2012-12-19 DIAGNOSIS — IMO0002 Reserved for concepts with insufficient information to code with codable children: Secondary | ICD-10-CM

## 2012-12-19 DIAGNOSIS — D6489 Other specified anemias: Secondary | ICD-10-CM

## 2012-12-19 DIAGNOSIS — E1165 Type 2 diabetes mellitus with hyperglycemia: Secondary | ICD-10-CM

## 2012-12-19 DIAGNOSIS — F329 Major depressive disorder, single episode, unspecified: Secondary | ICD-10-CM

## 2012-12-19 DIAGNOSIS — N760 Acute vaginitis: Secondary | ICD-10-CM | POA: Insufficient documentation

## 2012-12-19 DIAGNOSIS — N184 Chronic kidney disease, stage 4 (severe): Secondary | ICD-10-CM

## 2012-12-19 DIAGNOSIS — T148XXA Other injury of unspecified body region, initial encounter: Secondary | ICD-10-CM | POA: Insufficient documentation

## 2012-12-19 LAB — CBC
HCT: 25.2 % — ABNORMAL LOW (ref 36.0–46.0)
Hemoglobin: 8.4 g/dL — ABNORMAL LOW (ref 12.0–15.0)
MCV: 83.4 fL (ref 78.0–100.0)
RBC: 3.02 MIL/uL — ABNORMAL LOW (ref 3.87–5.11)
WBC: 7.6 10*3/uL (ref 4.0–10.5)

## 2012-12-19 MED ORDER — FLUCONAZOLE 150 MG PO TABS: 150.0000 mg | ORAL_TABLET | Freq: Once | ORAL | Status: DC

## 2012-12-19 MED ORDER — INSULIN GLARGINE 100 UNIT/ML ~~LOC~~ SOLN: 10.0000 [IU] | Freq: Every day | SUBCUTANEOUS | Status: DC

## 2012-12-19 MED ORDER — FERROUS SULFATE 325 (65 FE) MG PO TABS: 325.0000 mg | ORAL_TABLET | Freq: Three times a day (TID) | ORAL | Status: DC
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
## 2012-12-19 NOTE — Assessment & Plan Note (Signed)
Treat imperically for yeast.

## 2012-12-19 NOTE — Assessment & Plan Note (Signed)
Happiest I have seen her in years.

## 2012-12-19 NOTE — Assessment & Plan Note (Signed)
Concerned re hypoxia.

## 2012-12-19 NOTE — Assessment & Plan Note (Signed)
Small abrasion wrist left.  Due for tetanus

## 2012-12-19 NOTE — Assessment & Plan Note (Signed)
Worse, bump iron to TID and check ferritin.  May need epo.

## 2012-12-19 NOTE — Assessment & Plan Note (Signed)
Good control

## 2012-12-19 NOTE — Assessment & Plan Note (Signed)
Great control.  Will decrease insulin due to some hypoglycemia spells.

## 2012-12-19 NOTE — Patient Instructions (Signed)
Your diabetes is under great control - cut back your insulin to 10 units a day I am worried about your anemia - increase your iron to three times per day.   I am also worried about your low oxygen level.  Increase your oxygen to 3-4 liters per minute.  We will get [facility] to your house to see about adding a humidifier. I sent in a prescription for the yeast infection. You get flu shot and a tetanus shot today. See me in 3-4 weeks to make sure this oxygen, anemia thing is improving.

## 2012-12-22 NOTE — Progress Notes (Signed)
Patient ID: [name], female   DOB: 1930/12/24, 79 y.o.   MRN: [id number] Ferritin up.  Likely no iron deficiency (could be iron deficiency and inflamation).  Continue on TID iron and recheck CBC next visit.  Will likely need epo.

## 2012-12-23 ENCOUNTER — Telehealth: Payer: Self-pay | Admitting: *Deleted

## 2012-12-23 NOTE — Telephone Encounter (Signed)
Spoke with patient she states services are from [facility]. Steen Bisig, [name]

## 2012-12-23 NOTE — Telephone Encounter (Signed)
Message copied by Tanna Savoy on Tue Dec 23, 2012 12:06 PM ------      Message from: Doralee Albino A      Created: Mon Dec 22, 2012  1:44 PM       I thought Nelva Bush would arrange.  Looks like I need to count on you.  Bill      ----- Message -----         From: Theresia Bough, LCSW         Sent: 12/19/2012   3:53 PM           To: Sanjuana Letters, MD            Hey Dr. [name],            I'm not sure if you meant to send this to me. I'm not sure who [facility] is. If it is related to homehealth the nurses would be the ones to arrange O2 services for her. Please let me know if theres something you would like me to do with this patient.            Thanks, Nelva Bush      ----- Message -----         From: Sanjuana Letters, MD         Sent: 12/19/2012  12:13 PM           To: Theresia Bough, LCSW            Need to contact [facility] re increase in her home O2 and add humidifier due to nose bleeds             ------

## 2012-12-31 ENCOUNTER — Telehealth: Payer: Self-pay | Admitting: Family Medicine

## 2012-12-31 ENCOUNTER — Other Ambulatory Visit (INDEPENDENT_AMBULATORY_CARE_PROVIDER_SITE_OTHER): Payer: Medicaid Other

## 2012-12-31 DIAGNOSIS — E876 Hypokalemia: Secondary | ICD-10-CM

## 2012-12-31 LAB — BASIC METABOLIC PANEL
BUN: 44 mg/dL — ABNORMAL HIGH (ref 6–23)
Chloride: 103 mEq/L (ref 96–112)
Creatinine, Ser: 2.7 mg/dL — ABNORMAL HIGH (ref 0.4–1.2)
Glucose, Bld: 97 mg/dL (ref 70–99)
Potassium: 4 mEq/L (ref 3.5–5.1)

## 2012-12-31 NOTE — Telephone Encounter (Signed)
Patient dropped off forms to be filled out.  Please fax when completed.  °

## 2013-01-01 NOTE — Telephone Encounter (Signed)
Forms given to Dr [name] to complete. [name], [name]

## 2013-01-05 ENCOUNTER — Other Ambulatory Visit: Payer: Self-pay | Admitting: *Deleted

## 2013-01-05 DIAGNOSIS — I5032 Chronic diastolic (congestive) heart failure: Secondary | ICD-10-CM

## 2013-01-05 DIAGNOSIS — I4891 Unspecified atrial fibrillation: Secondary | ICD-10-CM

## 2013-01-07 ENCOUNTER — Ambulatory Visit: Payer: Medicare Other | Admitting: Family Medicine

## 2013-01-14 ENCOUNTER — Ambulatory Visit (INDEPENDENT_AMBULATORY_CARE_PROVIDER_SITE_OTHER): Payer: Medicare Other | Admitting: Family Medicine

## 2013-01-14 ENCOUNTER — Other Ambulatory Visit: Payer: Medicare Other

## 2013-01-14 ENCOUNTER — Encounter: Payer: Self-pay | Admitting: Family Medicine

## 2013-01-14 VITALS — BP 178/96 | HR 106 | Temp 98.1°F | Ht 65.0 in | Wt 144.4 lb

## 2013-01-14 DIAGNOSIS — D6489 Other specified anemias: Secondary | ICD-10-CM

## 2013-01-14 DIAGNOSIS — I10 Essential (primary) hypertension: Secondary | ICD-10-CM

## 2013-01-14 DIAGNOSIS — N184 Chronic kidney disease, stage 4 (severe): Secondary | ICD-10-CM

## 2013-01-14 DIAGNOSIS — I4891 Unspecified atrial fibrillation: Secondary | ICD-10-CM

## 2013-01-14 DIAGNOSIS — I509 Heart failure, unspecified: Secondary | ICD-10-CM

## 2013-01-14 DIAGNOSIS — Z515 Encounter for palliative care: Secondary | ICD-10-CM

## 2013-01-14 DIAGNOSIS — I5032 Chronic diastolic (congestive) heart failure: Secondary | ICD-10-CM

## 2013-01-14 LAB — CBC
HCT: 27.8 % — ABNORMAL LOW (ref 36.0–46.0)
MCHC: 32.7 g/dL (ref 30.0–36.0)
MCV: 83.2 fL (ref 78.0–100.0)
Platelets: 236 10*3/uL (ref 150–400)
RBC: 3.34 MIL/uL — ABNORMAL LOW (ref 3.87–5.11)
RDW: 17.2 % — ABNORMAL HIGH (ref 11.5–15.5)
WBC: 8.7 10*3/uL (ref 4.0–10.5)

## 2013-01-14 LAB — BASIC METABOLIC PANEL
BUN: 37 mg/dL — ABNORMAL HIGH (ref 6–23)
Calcium: 9.8 mg/dL (ref 8.4–10.5)
Chloride: 102 mEq/L (ref 96–112)
Creat: 2.44 mg/dL — ABNORMAL HIGH (ref 0.50–1.10)
Glucose, Bld: 63 mg/dL — ABNORMAL LOW (ref 70–99)

## 2013-01-14 MED ORDER — CARVEDILOL 25 MG PO TABS: 25.0000 mg | ORAL_TABLET | Freq: Two times a day (BID) | ORAL | Status: DC

## 2013-01-14 NOTE — Assessment & Plan Note (Signed)
Borderline high rate.  Increase carvedolol

## 2013-01-14 NOTE — Assessment & Plan Note (Signed)
Reaffirmed out of facility DNR.

## 2013-01-14 NOTE — Progress Notes (Signed)
   Subjective:    Patient ID: [name], female    DOB: August 08, 1930, 79 y.o.   MRN: [id number]  HPI  States she is at her baseline.  Seen by cards who decreased torsemide to 60 mg daily.  Did not bring in meds.  Does have SOB on minimal exertion NY Heart Association class 3 to 4 CHF.  This is not changed.  She is happy with her life.  She does have an out of facility DNR and desires that it remain in place.  No other complaints.    Review of Systems     Objective:   Physical Exam BP confirmed to be elevated.  Pulse is borderline high. No edema.  Wt down 4 lbs.  Lungs minimal bibasilar crackles Cardiac irreg irreg, rate around 100       Assessment & Plan:

## 2013-01-14 NOTE — Assessment & Plan Note (Signed)
Will recheck creat to see if lower with decreased torsemide, which I will decrease further given low weight and lack of edema.

## 2013-01-14 NOTE — Assessment & Plan Note (Signed)
Dry, perhaps over diuresed.  Decrease torsemide.

## 2013-01-14 NOTE — Assessment & Plan Note (Signed)
Poor control.  Increase coreg 

## 2013-01-14 NOTE — Assessment & Plan Note (Signed)
Recheck to see if improved on higher dose iron.

## 2013-01-14 NOTE — Patient Instructions (Signed)
I want you to cut back your fluid pill - torsemide - from three pills a day to two pills a day. I sent in a new prescription for your carvedilol.  Currently you are taking 12.5 mg twice a day.  I want you to increase that to 25 mg twice a day.  You can use up your current supply by taking two pills twice a day.   I will call with the blood results.  I will also send Dr. Shirlee Latch a copy of this visit.

## 2013-01-15 NOTE — Progress Notes (Addendum)
Patient ID: [name], female   DOB: [birth year], 79 y.o.   MRN: [id number] Lab review.  Hgb up modestly on higher dose of iron and Creat down modestly on lower dose of torsemide.  Will inform patient and no change in plan.

## 2013-01-30 ENCOUNTER — Encounter: Payer: Self-pay | Admitting: Internal Medicine

## 2013-01-30 DIAGNOSIS — I495 Sick sinus syndrome: Secondary | ICD-10-CM

## 2013-04-15 ENCOUNTER — Ambulatory Visit: Payer: Medicare Other | Admitting: Cardiology

## 2013-05-01 ENCOUNTER — Encounter: Payer: Self-pay | Admitting: Internal Medicine

## 2013-05-13 ENCOUNTER — Ambulatory Visit (INDEPENDENT_AMBULATORY_CARE_PROVIDER_SITE_OTHER): Payer: PRIVATE HEALTH INSURANCE | Admitting: Family Medicine

## 2013-05-13 ENCOUNTER — Encounter: Payer: Self-pay | Admitting: Family Medicine

## 2013-05-13 VITALS — BP 129/57 | HR 79 | Temp 97.8°F | Ht 65.0 in | Wt 151.3 lb

## 2013-05-13 DIAGNOSIS — I4891 Unspecified atrial fibrillation: Secondary | ICD-10-CM

## 2013-05-13 DIAGNOSIS — E118 Type 2 diabetes mellitus with unspecified complications: Secondary | ICD-10-CM

## 2013-05-13 DIAGNOSIS — I5032 Chronic diastolic (congestive) heart failure: Secondary | ICD-10-CM

## 2013-05-13 DIAGNOSIS — IMO0002 Reserved for concepts with insufficient information to code with codable children: Secondary | ICD-10-CM

## 2013-05-13 DIAGNOSIS — I10 Essential (primary) hypertension: Secondary | ICD-10-CM

## 2013-05-13 DIAGNOSIS — I509 Heart failure, unspecified: Secondary | ICD-10-CM

## 2013-05-13 DIAGNOSIS — E1165 Type 2 diabetes mellitus with hyperglycemia: Secondary | ICD-10-CM

## 2013-05-13 DIAGNOSIS — I5033 Acute on chronic diastolic (congestive) heart failure: Secondary | ICD-10-CM

## 2013-05-13 DIAGNOSIS — D6489 Other specified anemias: Secondary | ICD-10-CM

## 2013-05-13 DIAGNOSIS — N184 Chronic kidney disease, stage 4 (severe): Secondary | ICD-10-CM

## 2013-05-13 LAB — CBC
HCT: 26.3 % — ABNORMAL LOW (ref 36.0–46.0)
Hemoglobin: 8.5 g/dL — ABNORMAL LOW (ref 12.0–15.0)
MCH: 27.3 pg (ref 26.0–34.0)
MCHC: 32.3 g/dL (ref 30.0–36.0)
MCV: 84.6 fL (ref 78.0–100.0)
Platelets: 190 10*3/uL (ref 150–400)
RBC: 3.11 MIL/uL — AB (ref 3.87–5.11)
RDW: 17.6 % — ABNORMAL HIGH (ref 11.5–15.5)
WBC: 6.1 10*3/uL (ref 4.0–10.5)

## 2013-05-13 LAB — POCT GLYCOSYLATED HEMOGLOBIN (HGB A1C): HEMOGLOBIN A1C: 6.1

## 2013-05-13 MED ORDER — TORSEMIDE 20 MG PO TABS: 100.0000 mg | ORAL_TABLET | Freq: Once | ORAL | Status: DC

## 2013-05-13 NOTE — Patient Instructions (Addendum)
Go home and take 5 torsemide as soon you get home.  Take five torsemide pills daily every morning until you see me on Monday. See me Monday at 11:00 AM

## 2013-05-14 LAB — BASIC METABOLIC PANEL
BUN: 46 mg/dL — ABNORMAL HIGH (ref 6–23)
CHLORIDE: 104 meq/L (ref 96–112)
CO2: 25 meq/L (ref 19–32)
Calcium: 9.1 mg/dL (ref 8.4–10.5)
Creat: 2.33 mg/dL — ABNORMAL HIGH (ref 0.50–1.10)
GLUCOSE: 107 mg/dL — AB (ref 70–99)
POTASSIUM: 4 meq/L (ref 3.5–5.3)
Sodium: 142 mEq/L (ref 135–145)

## 2013-05-14 MED ORDER — DILTIAZEM HCL ER COATED BEADS 240 MG PO CP24: 240.0000 mg | ORAL_CAPSULE | Freq: Every day | ORAL | Status: DC

## 2013-05-14 NOTE — Assessment & Plan Note (Signed)
Looks pale.  On iron.  Perhaps needs epo.

## 2013-05-14 NOTE — Assessment & Plan Note (Signed)
Could easily justify hospitalization but patient wants to try to stay home.   Has already taken torsemide 40 today. Increase to torsemide 100 daily including dose today. See in five days.  To ER if worsening SOB.

## 2013-05-14 NOTE — Assessment & Plan Note (Signed)
Check creat 

## 2013-05-14 NOTE — Assessment & Plan Note (Signed)
Low BP - but all BP meds control either rate or volume - so leave unchanged.  Stand slowly and fall precautions given.

## 2013-05-14 NOTE — Assessment & Plan Note (Signed)
Good control

## 2013-05-14 NOTE — Assessment & Plan Note (Addendum)
Rate controled.  Not on anticoag due to anemia.  On med rec has been taking diltiazem 240, not 120

## 2013-05-14 NOTE — Progress Notes (Signed)
   Subjective:    Patient ID: [name], female    DOB: [birth year], 80 y.o.   MRN: [id number]  HPI I am quite concerned about [name].  Her primary problem is right hear failure.  She comes in with worsening SOB on minimal exertion and significant orthopnea.  Has been diligent about taking prescribed meds.  Her pulse ox on entry to [facility] was high 70s on 3 l/min per [place] from the exertion of entering.  Rebounded to high 80s.   Wants handicapped license paperwork. Had one fall.    Review of Systems     Objective:   Physical Exam Note wt gain of 7 lbs. + JVD Bilateral rales Irreg irreg but rate controled. 2+ peripheral edema.        Assessment & Plan:

## 2013-05-18 ENCOUNTER — Encounter: Payer: Self-pay | Admitting: Emergency Medicine

## 2013-05-18 ENCOUNTER — Ambulatory Visit (INDEPENDENT_AMBULATORY_CARE_PROVIDER_SITE_OTHER): Payer: PRIVATE HEALTH INSURANCE | Admitting: Family Medicine

## 2013-05-18 ENCOUNTER — Encounter: Payer: Self-pay | Admitting: Family Medicine

## 2013-05-18 VITALS — BP 145/69 | Temp 97.6°F | Ht 65.0 in | Wt 149.0 lb

## 2013-05-18 DIAGNOSIS — D6489 Other specified anemias: Secondary | ICD-10-CM

## 2013-05-18 DIAGNOSIS — I5032 Chronic diastolic (congestive) heart failure: Secondary | ICD-10-CM

## 2013-05-18 DIAGNOSIS — I5033 Acute on chronic diastolic (congestive) heart failure: Secondary | ICD-10-CM

## 2013-05-18 DIAGNOSIS — K5900 Constipation, unspecified: Secondary | ICD-10-CM | POA: Insufficient documentation

## 2013-05-18 DIAGNOSIS — N184 Chronic kidney disease, stage 4 (severe): Secondary | ICD-10-CM

## 2013-05-18 DIAGNOSIS — I4891 Unspecified atrial fibrillation: Secondary | ICD-10-CM

## 2013-05-18 LAB — BASIC METABOLIC PANEL
BUN: 44 mg/dL — ABNORMAL HIGH (ref 6–23)
CHLORIDE: 103 meq/L (ref 96–112)
CO2: 29 mEq/L (ref 19–32)
Calcium: 8.9 mg/dL (ref 8.4–10.5)
Creat: 2.42 mg/dL — ABNORMAL HIGH (ref 0.50–1.10)
Glucose, Bld: 144 mg/dL — ABNORMAL HIGH (ref 70–99)
Potassium: 3.8 mEq/L (ref 3.5–5.3)
SODIUM: 144 meq/L (ref 135–145)

## 2013-05-18 LAB — FERRITIN: FERRITIN: 263 ng/mL (ref 10–291)

## 2013-05-18 MED ORDER — TORSEMIDE 100 MG PO TABS: 100.0000 mg | ORAL_TABLET | Freq: Every day | ORAL | Status: DC

## 2013-05-18 MED ORDER — POLYETHYLENE GLYCOL 3350 17 GM/SCOOP PO POWD: 17.0000 g | Freq: Two times a day (BID) | ORAL | Status: DC | PRN

## 2013-05-18 NOTE — Assessment & Plan Note (Signed)
Will check iron studies.  If normal, consider epo.

## 2013-05-18 NOTE — Progress Notes (Signed)
   Subjective:    Patient ID: [name], female    DOB: [birth year], 80 y.o.   MRN: [id number]  HPI FU acute on chronic CHF exac.  She took her extra torsemide and "was peeing all the time."  She feels better.  Still with orthopnea.  Less DOE.  Able to do more around the house.  Did have dietary indiscretion with salt - ate potato chips over the WE.  She states per her home scales, she has lost four pounds and this morning was at her former dry weight of 143 lbs per her home scales.  Of note, she has had poor appetite recently and slow weight loss.  I suspect that her dry weight is now 140 or less.  CBC and BMP last visit OK with CKD unchanged.    Review of Systems     Objective:   Physical Exam 2.3 lb wt loss per our scales.  Not tachycardic or tachypneic at rest. Cardiac irreg irreg but rate controled Lungs still rare bibasilar crackles. Ext 2+ edema.        Assessment & Plan:

## 2013-05-18 NOTE — Assessment & Plan Note (Signed)
Stable, recheck BMP to make sure no kidney injury with higher doses of torsemide.

## 2013-05-18 NOTE — Assessment & Plan Note (Signed)
I still think she is above her dry weight.  Recheck BMP and continue torsemide 100 mg daily.  FU 2 week.

## 2013-05-18 NOTE — Patient Instructions (Addendum)
See me in 2 weeks I think the dry weight we are now aiming for is 140 lbs on your scales - maybe even a touch lower. Take 100 mg of the torsemide daily (five of your old pills - one of the new ones I just sent in.) Stay on all the other same medications.  Stay active - but do not push yourself too hard.

## 2013-05-25 ENCOUNTER — Telehealth: Payer: Self-pay | Admitting: Family Medicine

## 2013-05-25 ENCOUNTER — Other Ambulatory Visit: Payer: Self-pay | Admitting: Family Medicine

## 2013-05-25 NOTE — Telephone Encounter (Signed)
Dear Dema Severin Team Which mediciines? All medicines? Which pharmacy?  90 days rx or 30 day rx? THANKS![name]

## 2013-05-25 NOTE — Telephone Encounter (Signed)
Dr Nori Riis patient recently seen,I went ahead and gave her 1 months worth of lipitor,cymbalta,coreg and protonix.Hope that's ok.spoke with Dr Wendy Poet before approval,I was hoping to avoid going back and forth.that you. Patient informed.[place]

## 2013-05-25 NOTE — Telephone Encounter (Signed)
Pt called and needs refills on her medications called in. jw

## 2013-05-25 NOTE — Telephone Encounter (Signed)
Patient states he has enough til tomorrow.thank you.[place]

## 2013-05-26 NOTE — Telephone Encounter (Signed)
THANKS! Dickie La

## 2013-05-27 NOTE — Telephone Encounter (Signed)
Agree 

## 2013-05-27 NOTE — Progress Notes (Signed)
Patient ID: [name], female   DOB: 1930-12-12, 80 y.o.   MRN: [id number] Encounter created in error.  See other encounter for 05/18/13

## 2013-06-10 ENCOUNTER — Ambulatory Visit: Payer: PRIVATE HEALTH INSURANCE | Admitting: Family Medicine

## 2013-06-12 ENCOUNTER — Ambulatory Visit (INDEPENDENT_AMBULATORY_CARE_PROVIDER_SITE_OTHER): Payer: PRIVATE HEALTH INSURANCE | Admitting: Family Medicine

## 2013-06-12 ENCOUNTER — Encounter: Payer: Self-pay | Admitting: Family Medicine

## 2013-06-12 VITALS — BP 118/49 | HR 66 | Temp 97.9°F | Ht 65.0 in | Wt 144.0 lb

## 2013-06-12 DIAGNOSIS — N184 Chronic kidney disease, stage 4 (severe): Secondary | ICD-10-CM

## 2013-06-12 DIAGNOSIS — I5032 Chronic diastolic (congestive) heart failure: Secondary | ICD-10-CM

## 2013-06-12 DIAGNOSIS — I509 Heart failure, unspecified: Secondary | ICD-10-CM

## 2013-06-12 LAB — CBC
HCT: 26.5 % — ABNORMAL LOW (ref 36.0–46.0)
HEMOGLOBIN: 8.6 g/dL — AB (ref 12.0–15.0)
MCH: 27.1 pg (ref 26.0–34.0)
MCHC: 32.5 g/dL (ref 30.0–36.0)
MCV: 83.6 fL (ref 78.0–100.0)
Platelets: 147 10*3/uL — ABNORMAL LOW (ref 150–400)
RBC: 3.17 MIL/uL — AB (ref 3.87–5.11)
RDW: 17.2 % — ABNORMAL HIGH (ref 11.5–15.5)
WBC: 6 10*3/uL (ref 4.0–10.5)

## 2013-06-12 LAB — BASIC METABOLIC PANEL
BUN: 45 mg/dL — ABNORMAL HIGH (ref 6–23)
CALCIUM: 9.2 mg/dL (ref 8.4–10.5)
CHLORIDE: 102 meq/L (ref 96–112)
CO2: 31 meq/L (ref 19–32)
CREATININE: 2.23 mg/dL — AB (ref 0.50–1.10)
GLUCOSE: 113 mg/dL — AB (ref 70–99)
Potassium: 3.8 mEq/L (ref 3.5–5.3)
SODIUM: 142 meq/L (ref 135–145)

## 2013-06-12 NOTE — Progress Notes (Signed)
   Subjective:    Patient ID: [name], female    DOB: [birth year], 80 y.o.   MRN: [id number]  HPI Follow up exacerbation of heart failure and multiple other problems.  She feels fine.  She of course has DOE but much less so since last visit.  Able to do more around house.  Her weight each morning has been about 138-140 lbs.  Denies lightheadedness    Review of Systems     Objective:   Physical Exam Lungs clear, no rales Mild JVD 2/6 SEM 1+ peripheral edema.       Assessment & Plan:

## 2013-06-12 NOTE — Patient Instructions (Signed)
Stay on your same medications. I think that 140 lbs by your scale is a good weight for you now.   May sure you call us if the weight is less than 135 or more than 145.  I will want to adjust your medications if your weight changes.

## 2013-06-12 NOTE — Assessment & Plan Note (Signed)
Recheck - I think we are at a good point.  Dry wt now 138-140 lbs 06/12/2013

## 2013-06-18 ENCOUNTER — Encounter: Payer: Self-pay | Admitting: Family Medicine

## 2013-06-22 ENCOUNTER — Other Ambulatory Visit: Payer: Self-pay | Admitting: *Deleted

## 2013-06-22 DIAGNOSIS — I4891 Unspecified atrial fibrillation: Secondary | ICD-10-CM

## 2013-06-22 MED ORDER — ATORVASTATIN CALCIUM 10 MG PO TABS: 10.0000 mg | ORAL_TABLET | Freq: Every day | ORAL | Status: DC

## 2013-06-22 MED ORDER — PANTOPRAZOLE SODIUM 40 MG PO TBEC: 40.0000 mg | DELAYED_RELEASE_TABLET | Freq: Every day | ORAL | Status: AC

## 2013-06-22 MED ORDER — DILTIAZEM HCL ER COATED BEADS 240 MG PO CP24: 240.0000 mg | ORAL_CAPSULE | Freq: Every day | ORAL | Status: DC

## 2013-06-22 MED ORDER — DULOXETINE HCL 30 MG PO CPEP: 30.0000 mg | ORAL_CAPSULE | Freq: Every day | ORAL | Status: DC

## 2013-06-22 NOTE — Telephone Encounter (Signed)
Request for 90 day supply. Madilynn Montante L Marquavion Venhuizen, RN  

## 2013-06-25 ENCOUNTER — Other Ambulatory Visit: Payer: Self-pay | Admitting: *Deleted

## 2013-06-25 DIAGNOSIS — I4891 Unspecified atrial fibrillation: Secondary | ICD-10-CM

## 2013-06-26 MED ORDER — DILTIAZEM HCL ER COATED BEADS 240 MG PO CP24: 240.0000 mg | ORAL_CAPSULE | Freq: Every day | ORAL | Status: DC

## 2013-07-04 ENCOUNTER — Other Ambulatory Visit: Payer: Self-pay | Admitting: Nurse Practitioner

## 2013-07-07 ENCOUNTER — Other Ambulatory Visit: Payer: Self-pay | Admitting: *Deleted

## 2013-07-07 DIAGNOSIS — I4891 Unspecified atrial fibrillation: Secondary | ICD-10-CM

## 2013-07-07 MED ORDER — DULOXETINE HCL 30 MG PO CPEP: 30.0000 mg | ORAL_CAPSULE | Freq: Every day | ORAL | Status: DC

## 2013-07-07 MED ORDER — DILTIAZEM HCL ER COATED BEADS 240 MG PO CP24: 240.0000 mg | ORAL_CAPSULE | Freq: Every day | ORAL | Status: DC

## 2013-07-07 MED ORDER — CARVEDILOL 25 MG PO TABS: ORAL_TABLET | ORAL | Status: DC

## 2013-07-09 ENCOUNTER — Telehealth: Payer: Self-pay | Admitting: Family Medicine

## 2013-07-09 DIAGNOSIS — I4891 Unspecified atrial fibrillation: Secondary | ICD-10-CM

## 2013-07-09 MED ORDER — DILTIAZEM HCL ER COATED BEADS 240 MG PO CP24: 240.0000 mg | ORAL_CAPSULE | Freq: Every day | ORAL | Status: DC

## 2013-07-09 NOTE — Telephone Encounter (Signed)
Spoke with patient. i resent the [place] due to it being sent in as no print

## 2013-07-09 NOTE — Telephone Encounter (Signed)
Pt called and would like refills on all her medications . I ask which ones and she said all. I seen where some were fill a couple of days ago. jw

## 2013-07-13 ENCOUNTER — Encounter: Payer: Self-pay | Admitting: Cardiology

## 2013-07-14 ENCOUNTER — Telehealth: Payer: Self-pay | Admitting: Internal Medicine

## 2013-07-14 NOTE — Telephone Encounter (Signed)
07-13-13 sent past due device check letter certified/mt ° °

## 2013-07-17 ENCOUNTER — Other Ambulatory Visit: Payer: Self-pay | Admitting: *Deleted

## 2013-07-17 DIAGNOSIS — IMO0002 Reserved for concepts with insufficient information to code with codable children: Secondary | ICD-10-CM

## 2013-07-17 DIAGNOSIS — E118 Type 2 diabetes mellitus with unspecified complications: Principal | ICD-10-CM

## 2013-07-17 DIAGNOSIS — E1165 Type 2 diabetes mellitus with hyperglycemia: Secondary | ICD-10-CM

## 2013-07-17 MED ORDER — INSULIN GLARGINE 100 UNIT/ML SOLOSTAR PEN: 10.0000 [IU] | PEN_INJECTOR | Freq: Every day | SUBCUTANEOUS | Status: DC

## 2013-07-23 ENCOUNTER — Encounter: Payer: Self-pay | Admitting: Cardiology

## 2013-07-23 ENCOUNTER — Ambulatory Visit (INDEPENDENT_AMBULATORY_CARE_PROVIDER_SITE_OTHER): Payer: PRIVATE HEALTH INSURANCE | Admitting: Cardiology

## 2013-07-23 VITALS — Ht 65.0 in | Wt 151.0 lb

## 2013-07-23 DIAGNOSIS — I4891 Unspecified atrial fibrillation: Secondary | ICD-10-CM

## 2013-07-23 DIAGNOSIS — I421 Obstructive hypertrophic cardiomyopathy: Secondary | ICD-10-CM

## 2013-07-23 DIAGNOSIS — I509 Heart failure, unspecified: Secondary | ICD-10-CM

## 2013-07-23 DIAGNOSIS — N184 Chronic kidney disease, stage 4 (severe): Secondary | ICD-10-CM

## 2013-07-23 DIAGNOSIS — I5032 Chronic diastolic (congestive) heart failure: Secondary | ICD-10-CM

## 2013-07-23 MED ORDER — TORSEMIDE 20 MG PO TABS: ORAL_TABLET | ORAL | Status: DC

## 2013-07-23 NOTE — Patient Instructions (Signed)
Take torsemide 80mg  in the morning and 40mg  about 5PM. This will be 4 of your 20 mg tablets in the morning and 2 of your 20mg  tablets about 5PM.  Your physician recommends that you return for lab work in: about 10 days. I have given you an order for this. Please fax the results to Dr Aundra Dubin at [phone].  Your physician recommends that you schedule a follow-up appointment in: 1 month with Dr Aundra Dubin.

## 2013-07-24 NOTE — Telephone Encounter (Signed)
11-01-14 CERTIFIED LETTER SIGNED BY KEN Crumble /MT

## 2013-07-25 NOTE — Progress Notes (Signed)
Patient ID: [name], female   DOB: 11-19-1930, 80 y.o.   MRN: [id number] PCP: Dr. Andria Frames  80 yo with history of diastolic CHF in the setting of hypertrophic cardiomyopathy, chronic atrial fibrillation, and CKD returns for cardiology evaluation.  She continues to wear home oxygen.  She was admitted in 1/14 with C difficile diarrhea, AKI, and CHF.  She was on dopamine for a period of time.  She was discharged to SNF but is now home living with her daughter.    She is symptomatically stable.  She uses a walker to get around the house.  She does not go out except for MD appointments.  She has mild dyspnea walking around her house that is chronic.  No orthopnea, PND, or chest pain. No recent falls.  She is able to do some housework but it makes her short of breath.  She tires very easily.  Weight is up by 5 lbs since last appointment here.    Labs (2/12): BNP 521, K 4, creatinine 1.8 Labs (7/12): BNP 526 => 244, creatinine 1.9 => 2.2, K 4.1  Labs (8/12): BNP 410, K 3.3, creatinine 2.5 Labs (9/12): K 4.4, creatinine 2.2 Labs (10/12): K 3.6, creatinine 3.1 => 2.5 Labs (11/12): K 4.8, creatinine 2.1 Labs (4/13): BNP 312 Labs (6/13): K 4.4, creatinine 1.88 => 2.9, BUN 50 => 79 Labs (7/13): K 4.9, creatinine 2.3 Labs (1/14): K 4, creatinine 1.64 Labs (5/15); K 3.8, creatinine 2.23  ECG: Atrial fibrillation, right axis deviation, inferolateral T wave inversions  PMH: 1. [place] PCM  2. Chronic atrial fibrillation: not on warfarin at this point due to GI bleeding and frequent falls.  3. H/o CVA 4. Depression 5. CKD 6. DM 7. HTN 8. Hyperlipidemia 9. Hypertrophic cardiomyopathy with diastolic CHF: Echo (8/67) with EDF 55-60%, severe asymptomatic septal hypertrophy, no significant LV outflow tract gradient, mild MR, PA systolic pressure 53 mmHg.  Echo (5/13) with EF 50%, focal basal septal hypertrophy, no SAM, posterior hypokinesis, mild-moderate MR, massive LAE, RV mildly dilated with mildly  decreased systolic function, PASP 50 mmHg.  Echo (1/14) with EF 55-60%, focal basal septal hypertrophy, mild-moderate MR, severe biatrial enlargement, PA systolic pressure 54 mmHg.  RHC (1/14) with mean RA 15, PA 54/37, mean PCWP 20, CI 2.4, PVR 5.4.  10.  Nonobstructive CAD: 6/07 cath with luminal irregularities.  11. Chronic respiratory failure on home oxygen.   SH: Widow, has 7 children.  Never smoked.  Lives in [place] with one of her daughters.  FH: CAD, CVA  ROS: All systems reviewed and negative except as per HPI.   Current Outpatient Prescriptions  Medication Sig Dispense Refill  . acetaminophen (TYLENOL) 325 MG tablet Take 2 tablets (650 mg total) by mouth 3 (three) times daily.  180 tablet  0  . atorvastatin (LIPITOR) 10 MG tablet Take 1 tablet (10 mg total) by mouth daily.  90 tablet  3  . carvedilol (COREG) 25 MG tablet TAKE ONE TABLET BY MOUTH TWICE DAILY WITH MEALS  180 tablet  3  . diltiazem (CARTIA XT) 240 MG 24 hr capsule Take 1 capsule (240 mg total) by mouth daily.  90 capsule  3  . DULoxetine (CYMBALTA) 30 MG capsule Take 1 capsule (30 mg total) by mouth daily.  90 capsule  3  . ferrous sulfate 325 (65 FE) MG tablet Take 1 tablet (325 mg total) by mouth 3 (three) times daily with meals.  100 tablet  6  . Insulin Glargine (LANTUS)  100 UNIT/ML Solostar Pen Inject 10 Units into the skin daily at 10 pm.  15 mL  11  . NON FORMULARY Oxygen; 2 L, Dx 428.22 86% on RA       . pantoprazole (PROTONIX) 40 MG tablet Take 1 tablet (40 mg total) by mouth daily at 6 (six) AM.  90 tablet  3  . polyethylene glycol powder (GLYCOLAX/MIRALAX) powder Take 17 g by mouth 2 (two) times daily as needed.  3350 g  1  . potassium chloride (K-DUR) 10 MEQ tablet TAKE ONE TABLET BY MOUTH ONCE DAILY  30 tablet  4  . torsemide (DEMADEX) 20 MG tablet 4 tablets (80mg ) in the AM and 2 tablets (40mg ) about 5PM  540 tablet  3   No current facility-administered medications for this visit.    Ht 5\' 5"  (1.651  m)  Wt 68.493 kg (151 lb)  BMI 25.13 kg/m2 General: NAD Neck: JVP 12 cm, no thyromegaly or thyroid nodule.  Lungs: Slight crackles at bases bilaterally.   CV: Nondisplaced PMI.  Heart irregular S1/S2, no O1/L5, 1/6 systolic murmur along the sternal border. 1+ edema at ankles. No carotid bruit.   Abdomen: Soft, nontender, no hepatosplenomegaly, no distention.  Neurologic: Alert and oriented x 3.  Psych: Depressed affect. Extremities: No clubbing or cyanosis.   Assessment/Plan: 1. Hypertrophic cardiomyopathy: She has not had significant outflow tract obstruction on her recent echoes, but she does have diastolic CHF.  Continue beta blocker and calcium channel blocker as these may be limiting outflow tract obstruction.  2. Chronic diastolic CHF: She is volume overloaded on exam.  She has chronic respiratory failure on home oxygen.  NYHA class IIIb symptoms.  - Change torsemide to 80 qam, 40 qpm.  - BMET/BNP in 10 days.   3. CKD: Follow creatinine with diuresis.   4. Atrial fibrillation: Chronic.  Rate controlled with Coreg and diltiazem CD.  She is not on anticoagulation due to frequent falls in the past and history of GI bleeding.   [name] 07/25/2013

## 2013-07-28 ENCOUNTER — Encounter: Payer: Self-pay | Admitting: Cardiology

## 2013-07-28 ENCOUNTER — Telehealth: Payer: Self-pay | Admitting: Cardiology

## 2013-07-28 DIAGNOSIS — I4891 Unspecified atrial fibrillation: Secondary | ICD-10-CM

## 2013-07-28 DIAGNOSIS — I5032 Chronic diastolic (congestive) heart failure: Secondary | ICD-10-CM

## 2013-07-28 NOTE — Telephone Encounter (Signed)
done

## 2013-07-28 NOTE — Telephone Encounter (Signed)
New Message  Pt called requests a lab appt for changed medication. Made lab appt for 06/25 . Please put in orders//SR

## 2013-07-28 NOTE — Addendum Note (Signed)
Addended by: Katrine Coho on: 07/28/2013 08:13 AM   Modules accepted: Orders

## 2013-07-31 ENCOUNTER — Encounter: Payer: Self-pay | Admitting: Internal Medicine

## 2013-07-31 DIAGNOSIS — I495 Sick sinus syndrome: Secondary | ICD-10-CM

## 2013-08-06 ENCOUNTER — Other Ambulatory Visit (INDEPENDENT_AMBULATORY_CARE_PROVIDER_SITE_OTHER): Payer: PRIVATE HEALTH INSURANCE

## 2013-08-06 DIAGNOSIS — I5032 Chronic diastolic (congestive) heart failure: Secondary | ICD-10-CM

## 2013-08-06 DIAGNOSIS — I4891 Unspecified atrial fibrillation: Secondary | ICD-10-CM

## 2013-08-07 ENCOUNTER — Other Ambulatory Visit: Payer: Self-pay | Admitting: *Deleted

## 2013-08-07 LAB — BASIC METABOLIC PANEL
BUN: 46 mg/dL — AB (ref 6–23)
CO2: 32 mEq/L (ref 19–32)
Calcium: 9.1 mg/dL (ref 8.4–10.5)
Chloride: 103 mEq/L (ref 96–112)
Creatinine, Ser: 2.6 mg/dL — ABNORMAL HIGH (ref 0.4–1.2)
GFR: 18.94 mL/min — AB (ref >60.00)
Glucose, Bld: 76 mg/dL (ref 70–99)
POTASSIUM: 3.3 meq/L — AB (ref 3.5–5.1)
Sodium: 143 mEq/L (ref 135–145)

## 2013-08-07 LAB — BRAIN NATRIURETIC PEPTIDE: Pro B Natriuretic peptide (BNP): 1765 pg/mL — ABNORMAL HIGH (ref 0.0–100.0)

## 2013-08-07 MED ORDER — POTASSIUM CHLORIDE ER 10 MEQ PO TBCR: 10.0000 meq | EXTENDED_RELEASE_TABLET | Freq: Two times a day (BID) | ORAL | Status: DC

## 2013-08-12 ENCOUNTER — Telehealth: Payer: Self-pay | Admitting: *Deleted

## 2013-08-12 NOTE — Telephone Encounter (Signed)
Spoke with patient and she is aware that she needs an annual wellness visit with suzanne.  She will have to check to see what days she can get transportation and then will call back to schedule.  Please schedule for an hour vist.   Thanks [employer]

## 2013-08-19 ENCOUNTER — Other Ambulatory Visit: Payer: Self-pay | Admitting: *Deleted

## 2013-08-19 MED ORDER — POTASSIUM CHLORIDE ER 10 MEQ PO TBCR: 10.0000 meq | EXTENDED_RELEASE_TABLET | Freq: Two times a day (BID) | ORAL | Status: DC

## 2013-08-20 ENCOUNTER — Encounter: Payer: Self-pay | Admitting: Home Health Services

## 2013-08-20 ENCOUNTER — Other Ambulatory Visit: Payer: Self-pay | Admitting: Family Medicine

## 2013-08-20 ENCOUNTER — Ambulatory Visit (INDEPENDENT_AMBULATORY_CARE_PROVIDER_SITE_OTHER): Payer: PRIVATE HEALTH INSURANCE | Admitting: Home Health Services

## 2013-08-20 VITALS — BP 121/70 | HR 68 | Temp 97.3°F | Ht 65.0 in | Wt 150.0 lb

## 2013-08-20 DIAGNOSIS — I2510 Atherosclerotic heart disease of native coronary artery without angina pectoris: Secondary | ICD-10-CM

## 2013-08-20 DIAGNOSIS — I5032 Chronic diastolic (congestive) heart failure: Secondary | ICD-10-CM

## 2013-08-20 DIAGNOSIS — Z0000 Encounter for general adult medical examination without abnormal findings: Secondary | ICD-10-CM

## 2013-08-20 DIAGNOSIS — I4891 Unspecified atrial fibrillation: Secondary | ICD-10-CM

## 2013-08-20 DIAGNOSIS — IMO0002 Reserved for concepts with insufficient information to code with codable children: Secondary | ICD-10-CM

## 2013-08-20 DIAGNOSIS — Z23 Encounter for immunization: Secondary | ICD-10-CM

## 2013-08-20 DIAGNOSIS — I5033 Acute on chronic diastolic (congestive) heart failure: Secondary | ICD-10-CM

## 2013-08-20 DIAGNOSIS — E118 Type 2 diabetes mellitus with unspecified complications: Secondary | ICD-10-CM

## 2013-08-20 DIAGNOSIS — E1165 Type 2 diabetes mellitus with hyperglycemia: Secondary | ICD-10-CM

## 2013-08-20 LAB — LIPID PANEL
Cholesterol: 97 mg/dL (ref 0–200)
HDL: 30 mg/dL — ABNORMAL LOW (ref >39)
LDL Cholesterol: 51 mg/dL (ref 0–99)
Total CHOL/HDL Ratio: 3.2 Ratio
Triglycerides: 78 mg/dL (ref <150)
VLDL: 16 mg/dL (ref 0–40)

## 2013-08-20 LAB — BASIC METABOLIC PANEL
BUN: 48 mg/dL — ABNORMAL HIGH (ref 6–23)
CO2: 28 mEq/L (ref 19–32)
CREATININE: 2.64 mg/dL — AB (ref 0.50–1.10)
Calcium: 9 mg/dL (ref 8.4–10.5)
Chloride: 101 mEq/L (ref 96–112)
Glucose, Bld: 113 mg/dL — ABNORMAL HIGH (ref 70–99)
Potassium: 3.9 mEq/L (ref 3.5–5.3)
Sodium: 140 mEq/L (ref 135–145)

## 2013-08-20 LAB — POCT GLYCOSYLATED HEMOGLOBIN (HGB A1C): Hemoglobin A1C: 6.3

## 2013-08-20 MED ORDER — TORSEMIDE 20 MG PO TABS: ORAL_TABLET | ORAL | Status: DC

## 2013-08-20 NOTE — Assessment & Plan Note (Signed)
Increased demedex to 80 bid until seen by cards.

## 2013-08-20 NOTE — Progress Notes (Signed)
Patient here for annual wellness visit, patient reports: Risk Factors/Conditions needing evaluation or treatment: Pt. Feels short of breath.  PCP-Dr. Andria Frames reviewed chart and recommended increasing her torsemide to 40 mg both morning and night.  Pt expressed understanding and will follow up with cardiologist on 7/23. Home Safety: Pt lives at home, with son and daughter  in a 1 story home.  Pt reports having smoke alarms. Other Information: Corrective lens: Pt wears daily corrective lens, has annual eye exams. Reports not seeing well out of left eye. Dentures: Pt has upper dentures. dentures, has irregular dental exams. Memory: Pt denies memory problems.  Patient's Mini Mental Score (recorded in doc. flowsheet): 24  Pt was unable to calculate.  Bladder:  Pt reports having problems with bladder control.  BMI/Exercise: We discussed BMI and strategies for weight loss including increasing torsemide and maintaining regular eating habits.  We also discussed exercise, pt is unable to move much due to her inability to breath well. Med Adherence:  We discussed importance of taking medications for dm and cholesterol.  Pt reports missing 0 day in the past week.  ADL/IADL:  Pt reports need assistance in home management, cooking cleaning, bathing, and transportation. Balance/Gait: Pt reports 1 falls in the past year.  We discussed home safety and fall prevention.    Balance Abnormal Patient value  Sitting balance    Sit to stand    Attempts to arise    Immediate standing balance    Standing balance    Nudge    Eyes closed- [place]    Tandem stance    Back lean    Neck Rotation    360 degree turn    Sitting down     Gait Abnormal Patient value  Initiation of gait    Step length-left    Step length-right    Step height-left    Step height-right    Step symmetry    Step continuity    Path deviation    Trunk movement    Walking stance        Annual Wellness Visit Requirements Recorded  Today In  Medical, family, social history Past Medical, Family, Social History Section  Current providers Care team  Current medications Medications  Wt, BP, Ht, BMI Vital signs  Hearing assessment (welcome visit) Hearing/vision  Tobacco, alcohol, illicit drug use History  ADL Nurse Assessment  Depression Screening Nurse Assessment  Cognitive impairment Nurse Assessment  Mini Mental Status Document Flowsheet  Fall Risk Fall/Depression  Home Safety Progress Note  End of Life Planning (welcome visit) Social Documentation  Medicare preventative services Progress Note  Risk factors/conditions needing evaluation/treatment Progress Note  Personalized health advice Patient Instructions, goals, letter  Diet & Exercise Social Documentation  Emergency Contact Social Documentation  Seat Belts Social Documentation  Sun exposure/protection Social Documentation

## 2013-08-20 NOTE — Progress Notes (Signed)
   Subjective:    Patient ID: [name], female    DOB: 1930-07-13, 80 y.o.   MRN: [id number]  HPI I have reviewed this visit and discussed with Lamont Dowdy and agree with her documentation Also seen due to shortness of breath.  Wt is noted to be up.  I increased her demedex and ordered labs.  She has a cards visit scheduled for next week.  Combination of CHF and CKD is a tricky one to treat.    Review of Systems     Objective:   Physical Exam        Assessment & Plan:

## 2013-09-01 ENCOUNTER — Telehealth: Payer: Self-pay | Admitting: Family Medicine

## 2013-09-01 NOTE — Telephone Encounter (Signed)
Spoke withpatient and appointment was made with Dr. Andria Frames tomorrow @ 11am

## 2013-09-01 NOTE — Telephone Encounter (Signed)
Left leg is swollen and there are now red spots on her leg She has been keeping it propped up What should she do?

## 2013-09-02 ENCOUNTER — Ambulatory Visit (INDEPENDENT_AMBULATORY_CARE_PROVIDER_SITE_OTHER): Payer: PRIVATE HEALTH INSURANCE | Admitting: Family Medicine

## 2013-09-02 ENCOUNTER — Inpatient Hospital Stay (HOSPITAL_COMMUNITY): Payer: PRIVATE HEALTH INSURANCE

## 2013-09-02 ENCOUNTER — Inpatient Hospital Stay (HOSPITAL_COMMUNITY)
Admission: AD | Admit: 2013-09-02 | Discharge: 2013-09-06 | DRG: 292 | Disposition: A | Discharge status: Home / Self Care | Payer: PRIVATE HEALTH INSURANCE | Source: Ambulatory Visit | Attending: Family Medicine | Admitting: Family Medicine

## 2013-09-02 ENCOUNTER — Encounter (HOSPITAL_COMMUNITY): Payer: Self-pay | Admitting: General Practice

## 2013-09-02 VITALS — BP 136/62 | HR 146 | Temp 97.6°F | Resp 22

## 2013-09-02 DIAGNOSIS — R042 Hemoptysis: Secondary | ICD-10-CM | POA: Diagnosis present

## 2013-09-02 DIAGNOSIS — R0609 Other forms of dyspnea: Secondary | ICD-10-CM | POA: Diagnosis present

## 2013-09-02 DIAGNOSIS — J9610 Chronic respiratory failure, unspecified whether with hypoxia or hypercapnia: Secondary | ICD-10-CM | POA: Diagnosis present

## 2013-09-02 DIAGNOSIS — I252 Old myocardial infarction: Secondary | ICD-10-CM

## 2013-09-02 DIAGNOSIS — N184 Chronic kidney disease, stage 4 (severe): Secondary | ICD-10-CM | POA: Diagnosis present

## 2013-09-02 DIAGNOSIS — I6789 Other cerebrovascular disease: Secondary | ICD-10-CM

## 2013-09-02 DIAGNOSIS — Z66 Do not resuscitate: Secondary | ICD-10-CM | POA: Diagnosis present

## 2013-09-02 DIAGNOSIS — H9190 Unspecified hearing loss, unspecified ear: Secondary | ICD-10-CM | POA: Diagnosis present

## 2013-09-02 DIAGNOSIS — I2510 Atherosclerotic heart disease of native coronary artery without angina pectoris: Secondary | ICD-10-CM | POA: Diagnosis present

## 2013-09-02 DIAGNOSIS — F3289 Other specified depressive episodes: Secondary | ICD-10-CM | POA: Diagnosis present

## 2013-09-02 DIAGNOSIS — M1990 Unspecified osteoarthritis, unspecified site: Secondary | ICD-10-CM | POA: Diagnosis present

## 2013-09-02 DIAGNOSIS — I4891 Unspecified atrial fibrillation: Secondary | ICD-10-CM | POA: Diagnosis present

## 2013-09-02 DIAGNOSIS — I872 Venous insufficiency (chronic) (peripheral): Secondary | ICD-10-CM | POA: Diagnosis present

## 2013-09-02 DIAGNOSIS — Z9181 History of falling: Secondary | ICD-10-CM

## 2013-09-02 DIAGNOSIS — Z794 Long term (current) use of insulin: Secondary | ICD-10-CM | POA: Diagnosis not present

## 2013-09-02 DIAGNOSIS — I509 Heart failure, unspecified: Secondary | ICD-10-CM

## 2013-09-02 DIAGNOSIS — I482 Chronic atrial fibrillation, unspecified: Secondary | ICD-10-CM

## 2013-09-02 DIAGNOSIS — E785 Hyperlipidemia, unspecified: Secondary | ICD-10-CM | POA: Diagnosis present

## 2013-09-02 DIAGNOSIS — I5032 Chronic diastolic (congestive) heart failure: Secondary | ICD-10-CM | POA: Diagnosis present

## 2013-09-02 DIAGNOSIS — Z950 Presence of cardiac pacemaker: Secondary | ICD-10-CM | POA: Diagnosis present

## 2013-09-02 DIAGNOSIS — D649 Anemia, unspecified: Secondary | ICD-10-CM | POA: Diagnosis present

## 2013-09-02 DIAGNOSIS — R32 Unspecified urinary incontinence: Secondary | ICD-10-CM | POA: Diagnosis present

## 2013-09-02 DIAGNOSIS — I129 Hypertensive chronic kidney disease with stage 1 through stage 4 chronic kidney disease, or unspecified chronic kidney disease: Secondary | ICD-10-CM | POA: Diagnosis present

## 2013-09-02 DIAGNOSIS — E1149 Type 2 diabetes mellitus with other diabetic neurological complication: Secondary | ICD-10-CM | POA: Diagnosis present

## 2013-09-02 DIAGNOSIS — I10 Essential (primary) hypertension: Secondary | ICD-10-CM | POA: Diagnosis present

## 2013-09-02 DIAGNOSIS — I5033 Acute on chronic diastolic (congestive) heart failure: Secondary | ICD-10-CM | POA: Diagnosis present

## 2013-09-02 DIAGNOSIS — F329 Major depressive disorder, single episode, unspecified: Secondary | ICD-10-CM | POA: Diagnosis present

## 2013-09-02 DIAGNOSIS — Z8673 Personal history of transient ischemic attack (TIA), and cerebral infarction without residual deficits: Secondary | ICD-10-CM | POA: Diagnosis not present

## 2013-09-02 DIAGNOSIS — I421 Obstructive hypertrophic cardiomyopathy: Secondary | ICD-10-CM | POA: Diagnosis present

## 2013-09-02 DIAGNOSIS — Z9981 Dependence on supplemental oxygen: Secondary | ICD-10-CM | POA: Diagnosis not present

## 2013-09-02 DIAGNOSIS — E7800 Pure hypercholesterolemia, unspecified: Secondary | ICD-10-CM | POA: Diagnosis present

## 2013-09-02 DIAGNOSIS — E118 Type 2 diabetes mellitus with unspecified complications: Secondary | ICD-10-CM | POA: Diagnosis present

## 2013-09-02 HISTORY — DX: Cardiac murmur, unspecified: R01.1

## 2013-09-02 HISTORY — DX: Angina pectoris, unspecified: I20.9

## 2013-09-02 HISTORY — DX: Personal history of other medical treatment: Z92.89

## 2013-09-02 HISTORY — DX: Acute myocardial infarction, unspecified: I21.9

## 2013-09-02 HISTORY — DX: Chronic kidney disease, stage 4 (severe): N18.4

## 2013-09-02 HISTORY — DX: Malignant melanoma of skin, unspecified: C43.9

## 2013-09-02 HISTORY — DX: Type 2 diabetes mellitus with diabetic neuropathy, unspecified: E11.40

## 2013-09-02 HISTORY — DX: Gout, unspecified: M10.9

## 2013-09-02 HISTORY — DX: Pneumonia, unspecified organism: J18.9

## 2013-09-02 LAB — COMPREHENSIVE METABOLIC PANEL
ALT: 14 U/L (ref 0–35)
AST: 19 U/L (ref 0–37)
Albumin: 3.5 g/dL (ref 3.5–5.2)
Alkaline Phosphatase: 107 U/L (ref 39–117)
Anion gap: 14 (ref 5–15)
BUN: 58 mg/dL — ABNORMAL HIGH (ref 6–23)
CO2: 28 meq/L (ref 19–32)
CREATININE: 2.76 mg/dL — AB (ref 0.50–1.10)
Calcium: 9.2 mg/dL (ref 8.4–10.5)
Chloride: 100 mEq/L (ref 96–112)
GFR, EST AFRICAN AMERICAN: 17 mL/min — AB (ref >90)
GFR, EST NON AFRICAN AMERICAN: 15 mL/min — AB (ref >90)
Glucose, Bld: 120 mg/dL — ABNORMAL HIGH (ref 70–99)
Potassium: 4.1 mEq/L (ref 3.7–5.3)
Sodium: 142 mEq/L (ref 137–147)
Total Bilirubin: 0.6 mg/dL (ref 0.3–1.2)
Total Protein: 7.5 g/dL (ref 6.0–8.3)

## 2013-09-02 LAB — GLUCOSE, CAPILLARY
Glucose-Capillary: 133 mg/dL — ABNORMAL HIGH (ref 70–99)
Glucose-Capillary: 118 mg/dL — ABNORMAL HIGH (ref 70–99)

## 2013-09-02 LAB — TROPONIN I: Troponin I: <0.3 ng/mL (ref <0.30)

## 2013-09-02 LAB — CBC
HCT: 29.1 % — ABNORMAL LOW (ref 36.0–46.0)
Hemoglobin: 8.7 g/dL — ABNORMAL LOW (ref 12.0–15.0)
MCH: 27.5 pg (ref 26.0–34.0)
MCHC: 29.9 g/dL — ABNORMAL LOW (ref 30.0–36.0)
MCV: 92.1 fL (ref 78.0–100.0)
PLATELETS: 131 10*3/uL — AB (ref 150–400)
RBC: 3.16 MIL/uL — AB (ref 3.87–5.11)
RDW: 17.1 % — ABNORMAL HIGH (ref 11.5–15.5)
WBC: 4.5 10*3/uL (ref 4.0–10.5)

## 2013-09-02 LAB — PROTIME-INR
INR: 1.35 (ref 0.00–1.49)
PROTHROMBIN TIME: 16.7 s — AB (ref 11.6–15.2)

## 2013-09-02 LAB — PRO B NATRIURETIC PEPTIDE: Pro B Natriuretic peptide (BNP): 34272 pg/mL — ABNORMAL HIGH (ref 0–450)

## 2013-09-02 LAB — TSH: TSH: 3.78 u[IU]/mL (ref 0.350–4.500)

## 2013-09-02 MED ORDER — ASPIRIN EC 81 MG PO TBEC
81.0000 mg | DELAYED_RELEASE_TABLET | Freq: Every day | ORAL | Status: DC
  Administered 2013-09-03 – 2013-09-06 (×4): 81 mg via ORAL
  Filled 2013-09-02 (×5): qty 1

## 2013-09-02 MED ORDER — SODIUM CHLORIDE 0.9 % IV SOLN: 250.0000 mL | INTRAVENOUS | Status: DC | PRN

## 2013-09-02 MED ORDER — ACETAMINOPHEN 325 MG PO TABS
650.0000 mg | ORAL_TABLET | Freq: Three times a day (TID) | ORAL | Status: DC
  Administered 2013-09-02 – 2013-09-06 (×12): 650 mg via ORAL
  Filled 2013-09-02 (×12): qty 2

## 2013-09-02 MED ORDER — INSULIN GLARGINE 100 UNIT/ML ~~LOC~~ SOLN
10.0000 [IU] | Freq: Every day | SUBCUTANEOUS | Status: DC
  Administered 2013-09-02 – 2013-09-03 (×2): 10 [IU] via SUBCUTANEOUS
  Filled 2013-09-02 (×3): qty 0.1

## 2013-09-02 MED ORDER — CARVEDILOL 25 MG PO TABS
25.0000 mg | ORAL_TABLET | Freq: Two times a day (BID) | ORAL | Status: DC
  Administered 2013-09-02 – 2013-09-03 (×3): 25 mg via ORAL
  Filled 2013-09-02 (×6): qty 1

## 2013-09-02 MED ORDER — SODIUM CHLORIDE 0.9 % IJ SOLN
3.0000 mL | INTRAMUSCULAR | Status: DC | PRN
  Administered 2013-09-03: 3 mL via INTRAVENOUS

## 2013-09-02 MED ORDER — FERROUS SULFATE 325 (65 FE) MG PO TABS
325.0000 mg | ORAL_TABLET | Freq: Three times a day (TID) | ORAL | Status: DC
  Administered 2013-09-02 – 2013-09-04 (×5): 325 mg via ORAL
  Filled 2013-09-02 (×8): qty 1

## 2013-09-02 MED ORDER — FUROSEMIDE 10 MG/ML IJ SOLN
80.0000 mg | Freq: Two times a day (BID) | INTRAMUSCULAR | Status: DC
  Administered 2013-09-02: 80 mg via INTRAVENOUS
  Filled 2013-09-02: qty 8

## 2013-09-02 MED ORDER — POTASSIUM CHLORIDE ER 10 MEQ PO TBCR
10.0000 meq | EXTENDED_RELEASE_TABLET | Freq: Two times a day (BID) | ORAL | Status: DC
Start: 2013-09-02 — End: 2013-09-06
  Administered 2013-09-02 – 2013-09-06 (×8): 10 meq via ORAL
  Filled 2013-09-02 (×10): qty 1

## 2013-09-02 MED ORDER — POLYETHYLENE GLYCOL 3350 17 GM/SCOOP PO POWD
17.0000 g | Freq: Two times a day (BID) | ORAL | Status: DC | PRN
  Filled 2013-09-02: qty 255

## 2013-09-02 MED ORDER — FUROSEMIDE 10 MG/ML IJ SOLN
80.0000 mg | Freq: Once | INTRAMUSCULAR | Status: AC
  Administered 2013-09-02: 80 mg via INTRAVENOUS
  Filled 2013-09-02: qty 8

## 2013-09-02 MED ORDER — SODIUM CHLORIDE 0.9 % IJ SOLN
3.0000 mL | Freq: Two times a day (BID) | INTRAMUSCULAR | Status: DC
Start: 2013-09-02 — End: 2013-09-04
  Administered 2013-09-03 – 2013-09-04 (×4): 3 mL via INTRAVENOUS

## 2013-09-02 MED ORDER — FUROSEMIDE 10 MG/ML IJ SOLN
80.0000 mg | Freq: Two times a day (BID) | INTRAMUSCULAR | Status: DC
  Administered 2013-09-03: 80 mg via INTRAVENOUS
  Filled 2013-09-02 (×4): qty 8

## 2013-09-02 MED ORDER — INSULIN GLARGINE 100 UNIT/ML SOLOSTAR PEN: 10.0000 [IU] | PEN_INJECTOR | Freq: Every day | SUBCUTANEOUS | Status: DC

## 2013-09-02 MED ORDER — DILTIAZEM HCL ER COATED BEADS 240 MG PO CP24
240.0000 mg | ORAL_CAPSULE | Freq: Every day | ORAL | Status: DC
  Administered 2013-09-03 – 2013-09-06 (×4): 240 mg via ORAL
  Filled 2013-09-02 (×4): qty 1

## 2013-09-02 MED ORDER — DULOXETINE HCL 30 MG PO CPEP
30.0000 mg | ORAL_CAPSULE | Freq: Every day | ORAL | Status: DC
  Administered 2013-09-03 – 2013-09-06 (×4): 30 mg via ORAL
  Filled 2013-09-02 (×4): qty 1

## 2013-09-02 MED ORDER — SODIUM CHLORIDE 0.9 % IJ SOLN
3.0000 mL | Freq: Two times a day (BID) | INTRAMUSCULAR | Status: DC
Start: 2013-09-02 — End: 2013-09-06
  Administered 2013-09-05 (×2): 3 mL via INTRAVENOUS

## 2013-09-02 MED ORDER — HEPARIN SODIUM (PORCINE) 5000 UNIT/ML IJ SOLN
5000.0000 [IU] | Freq: Three times a day (TID) | INTRAMUSCULAR | Status: DC
Start: 2013-09-02 — End: 2013-09-06
  Administered 2013-09-02 – 2013-09-06 (×13): 5000 [IU] via SUBCUTANEOUS
  Filled 2013-09-02 (×12): qty 1

## 2013-09-02 MED ORDER — PANTOPRAZOLE SODIUM 40 MG PO TBEC
40.0000 mg | DELAYED_RELEASE_TABLET | Freq: Every day | ORAL | Status: DC
  Administered 2013-09-03 – 2013-09-06 (×4): 40 mg via ORAL
  Filled 2013-09-02 (×4): qty 1

## 2013-09-02 MED ORDER — ATORVASTATIN CALCIUM 10 MG PO TABS
10.0000 mg | ORAL_TABLET | Freq: Every day | ORAL | Status: DC
  Administered 2013-09-03 – 2013-09-04 (×2): 10 mg via ORAL
  Filled 2013-09-02 (×3): qty 1

## 2013-09-02 MED ORDER — INSULIN ASPART 100 UNIT/ML ~~LOC~~ SOLN
0.0000 [IU] | Freq: Three times a day (TID) | SUBCUTANEOUS | Status: DC
  Administered 2013-09-03: 2 [IU] via SUBCUTANEOUS

## 2013-09-02 NOTE — Progress Notes (Signed)
UR Completed Jaree Dwight Graves-Bigelow, RN,BSN [phone]  

## 2013-09-02 NOTE — H&P (Signed)
I saw and examined [name] in the [facility].  Discussed with Dr. Wendi Snipes.  Agree with his documentation and management.  Briefly  Lindsey Soileau was encouraged to see me today due to worsening SOB with minimal exertion and worsening leg swelling rash.  She has a very serious combination of CHF (combined right heart failure and diastolic dysfunction.  She does not have systolic dysfunction CHF per last echo.)  And has Stage 4 chronic renal failure.  Her dry weight is estimated to be 140 lbs and weight today in [facility] is 152.  She has significant bilateral leg swelling and petechial hemorrhages consistent with both CHF exacerbation and chronic venous insufficiency.  She is already on a high dose of torsemide (80 mg qam and 40 mg qpm.)  Needs further diuresis.  Also, on chronic O2 therapy.  Pulse ox at rest on 3 l/minute hovers between 86-88%.  However it dropped as low as 59% and she became extremely dyspneic with the exertion of walking from the waiting room to the exam room which is less than 50 feet on level ground.    She recognizes that she has end stage disease and wants Do not resusitate.  However, she is willing to be hospitalized with the hope that her medication regimen can be tweaked to provide symptomatic relief.

## 2013-09-02 NOTE — Consult Note (Signed)
CARDIOLOGY CONSULT NOTE  Patient ID: [name] MRN: [id number] DOB/AGE: [birth year] 80 y.o.  Admit date: 09/02/2013 Primary Physician: Dr. Andria Frames Primary Cardiologist: Dr. Aundra Dubin Reason for Consultation: CHF  HPI:  80 yo with history of hypertrophic cardiomyopathy with chronic diastolic CHF, chronic respiratory failure on home oxygen, chronic atrial fibrillation (not on warfarin due to GI bleeding and falls), St Jude PPM, and CKD with baseline poor functional status was admitted with increased dyspnea. Patient states that she has been significantly more short of breath for the last 2 weeks.  She is dyspneic with any activity.  Her baseline even before this was severely impaired.  She has orthopnea and has to sleep on several pillows.  No chest pain.  No palpitations, lightheadedness, or syncope.  She was seen in [facility] clinic today and was admitted to the hospital for evaluation.  She states that she has been taking all her medications but has not been urinating as much as usual with her torsemide.  Creatinine is elevated and mildly higher than her prior baseline.   Review of systems complete and found to be negative unless listed above in HPI  PMH:  1. St.Jude PCM  2. Chronic atrial fibrillation: not on warfarin at this point due to GI bleeding and frequent falls.  3. H/o CVA  4. Depression  5. CKD  6. DM  7. HTN  8. Hyperlipidemia  9. Hypertrophic cardiomyopathy with diastolic CHF: Echo (4/70) with EDF 55-60%, severe asymptomatic septal hypertrophy, no significant LV outflow tract gradient, mild MR, PA systolic pressure 53 mmHg. Echo (5/13) with EF 50%, focal basal septal hypertrophy, no SAM, posterior hypokinesis, mild-moderate MR, massive LAE, RV mildly dilated with mildly decreased systolic function, PASP 50 mmHg. Echo (1/14) with EF 55-60%, focal basal septal hypertrophy, mild-moderate MR, severe biatrial enlargement, PA systolic pressure 54 mmHg. RHC (1/14) with mean RA  15, PA 54/37, mean PCWP 20, CI 2.4, PVR 5.4.  10. Nonobstructive CAD: 6/07 cath with luminal irregularities.  11. Chronic respiratory failure on home oxygen.    Family History  Problem Relation Age of Onset  . Heart attack      family hx  . Colon cancer      family hx  . Stroke      family hx  . Diabetes      DM - family hx   . COPD Mother     History   Social History  . Marital Status: Widowed    Spouse Name: [name]    Number of Children: 7  . Years of Education: 12   Occupational History  . Retired- Government social research officer    Social History Main Topics  . Smoking status: Never Smoker   . Smokeless tobacco: Not on file     Comment: non smoker   . Alcohol Use: No  . Drug Use: No  . Sexual Activity: Not Currently   Other Topics Concern  . Not on file   Social History Narrative   Lives in [place]. Husband died May 28, 2012   Was Freight forwarder in a Environmental consultant.          Financial assistance approved for 100% discount after Medicare pays for [facility] only, not eligible for [facility] card. Bonna Gains 12/23/09.       Health Care POA:    Emergency Contact: daughter [name] [phone]   End of Life Plan: pt verbalized DNR but doesn't have documentation in chart.   Who lives with you: son  and daughter help take care of her.   Any pets: none   Diet: Pt has a varied diet of protein starch and vegetables.   Exercise: Pt does not have regular exercise routine due to breathing difficulties.   Seatbelts: Pt reports wearing seatbelt when in vehicles.    Hobbies: reading, visiting with family.               Prescriptions prior to admission  Medication Sig Dispense Refill  . acetaminophen (TYLENOL) 325 MG tablet Take 650 mg by mouth every 6 (six) hours as needed for moderate pain.      Marland Kitchen atorvastatin (LIPITOR) 10 MG tablet Take 10 mg by mouth every morning.      . carvedilol (COREG) 25 MG tablet Take 25 mg by mouth 2 (two) times daily with a meal.      . diltiazem (CARTIA XT) 240 MG 24 hr  capsule Take 1 capsule (240 mg total) by mouth daily.  90 capsule  3  . DULoxetine (CYMBALTA) 30 MG capsule Take 1 capsule (30 mg total) by mouth daily.  90 capsule  3  . ferrous sulfate 325 (65 FE) MG tablet Take 1 tablet (325 mg total) by mouth 3 (three) times daily with meals.  100 tablet  6  . Insulin Glargine (LANTUS) 100 UNIT/ML Solostar Pen Inject 10 Units into the skin daily at 10 pm.  15 mL  11  . NON FORMULARY Oxygen; 2 L, Dx 428.22 86% on RA       . pantoprazole (PROTONIX) 40 MG tablet Take 1 tablet (40 mg total) by mouth daily at 6 (six) AM.  90 tablet  3  . polyethylene glycol (MIRALAX / GLYCOLAX) packet Take 17 g by mouth daily as needed for moderate constipation.      . potassium chloride (K-DUR) 10 MEQ tablet Take 1 tablet (10 mEq total) by mouth 2 (two) times daily.  30 tablet  4  . torsemide (DEMADEX) 20 MG tablet Take 40-80 mg by mouth daily. Takes 80 mg in am, and 40mg  in pm       Current Scheduled Meds: . acetaminophen  650 mg Oral TID  . aspirin EC  81 mg Oral Daily  . atorvastatin  10 mg Oral Daily  . carvedilol  25 mg Oral BID WC  . diltiazem  240 mg Oral Daily  . DULoxetine  30 mg Oral Daily  . ferrous sulfate  325 mg Oral TID WC  . furosemide  80 mg Intravenous BID  . heparin  5,000 Units Subcutaneous 3 times per day  . insulin aspart  0-9 Units Subcutaneous TID WC  . insulin glargine  10 Units Subcutaneous QHS  . [START ON 09/03/2013] pantoprazole  40 mg Oral Q0600  . potassium chloride  10 mEq Oral BID  . sodium chloride  3 mL Intravenous Q12H  . sodium chloride  3 mL Intravenous Q12H   Continuous Infusions:  PRN Meds:.sodium chloride, polyethylene glycol powder, sodium chloride   Physical exam Blood pressure 129/62, pulse 63, temperature 97.6 F (36.4 C), temperature source Oral, resp. rate 20, SpO2 92.00%. General: NAD Neck: JVP 14 cm with prominent EJ, no thyromegaly or thyroid nodule.  Lungs: Decreased breath sounds right base, crackles left  base. CV: Nondisplaced PMI.  Heart irregular S1/S2, no S3/S4, 1/6 SEM RUSB.  1+ edema 1/2 up lower legs.  No carotid bruit. Abdomen: Soft, nontender, no hepatosplenomegaly, no distention.  Skin: Petechial hemorrhages around the ankles.   Neurologic:  Alert and oriented x 3.  Psych: Normal affect. Extremities: No clubbing or cyanosis.  HEENT: Normal.   Labs:   Lab Results  Component Value Date   WBC 4.5 09/02/2013   HGB 8.7* 09/02/2013   HCT 29.1* 09/02/2013   MCV 92.1 09/02/2013   PLT 131* 09/02/2013    Recent Labs Lab 09/02/13 1354  NA 142  K 4.1  CL 100  CO2 28  BUN 58*  CREATININE 2.76*  CALCIUM 9.2  PROT 7.5  BILITOT 0.6  ALKPHOS 107  ALT 14  AST 19  GLUCOSE 120*  TnI < 0.3 BNP 34,272   Radiology: - CT chest: Moderate right pleural effusion, perihepatic ascites and concern for cirrhosis, interstitial infiltrate right lung concerning for atelectasis versus asymmetric edema  EKG: Not yet done  Telemetry: Atrial fibrillation with controlled rate.  Occasional v-pacing  ASSESSMENT AND PLAN:  80 yo with history of hypertrophic cardiomyopathy with chronic diastolic CHF, chronic respiratory failure on home oxygen, chronic atrial fibrillation (not on warfarin due to GI bleeding and falls), St Jude PPM, and CKD with baseline poor functional status was admitted with increased dyspnea. 1. Hypertrophic cardiomyopathy: She has not had significant outflow tract obstruction on her recent echoes, but she does have diastolic CHF. Continue beta blocker and calcium channel blocker as these may be limiting outflow tract obstruction.  2. Acute on chronic diastolic CHF: She is quite volume overloaded on exam with very high BNP. She has chronic respiratory failure at baseline on home oxygen and is very limited. NYHA class IIIb symptoms at baseline, now NYHA class IV.  Volume management is limited by CKD stage IV.  - Agree with Lasix 80 mg IV bid for now, follow response.   3. CKD stage IV:  Follow creatinine with diuresis.  I am concerned that volume management is going to be limited by her renal function.  4. Atrial fibrillation: Chronic. Rate controlled with Coreg and diltiazem CD. She is not on anticoagulation due to frequent falls in the past and history of GI bleeding.  5. Patient has overall poor prognosis and has poor function at baseline.  Would avoid invasive procedures.  She is appropriately DNR.   [name] 09/02/2013 5:32 PM

## 2013-09-02 NOTE — H&P (Signed)
[facility] Admission History and Physical Service Pager: [phone]  Patient name: [name] Medical record number: [id number] Date of birth: [birth year] Age: 80 y.o. Gender: female  Primary Care Provider: Zigmund Gottron, MD Consultants: Heart failure team/Cardiology Code Status: DNR  Chief Complaint: Dyspnea  Assessment and Plan: [name] is a 80 y.o. female presenting with CHF exacerbation complicated by CKD 4. PMH is significant for diastolic CHF s/p ppm, CAD, HOCM, DDD, CVA, A fib, HLD, DM2, DDD, and melanoma. She's had progressive dyspnea for about a week with an already poor respiratory status. This morning she and her daughter state that she coughed up a large blood clot. Given her comorbidities and gradually worsening dyspnea she likely has a CHF exacerbation which is complicated by her severe kidney disease. Considering her hemotysis and her lifetime of passive smoke exposure i'll go ahead and CT scan her chest without contrast to evaluate for possibility of lung mass.  To be clear, she is DNR and would like to go home quickly if it becomes apparent that our efforts to diurese her are too limited by her renal function and are not making significant improvements to her quality of life.   CHF exacerbation, CAD, HOCM, s/p PPM - progressive dyspnea and desats in a pt with known dCHF and right heart failure - Diuresis will be limited by her severe CKD - Consult Heart failure team - appreciate recommendations - hold home torsemide, Diurese with IV lasix 80 BID X 2 - Continue carvedilol, statin, cardizem - rule out MI - EKG, Cycle troponins - check BNP, CMP - CT scan with hemoptysis, also to eval pulmonary congestion - O2 support to achieve sats of 90% - Strict I/o, daily weights - Consider repeat Echo, last was 02/20/2012 with EF 55-60 - avoid positive inotropes like Digoxin with HOCM  CKD stage IV - baseline Cre 2.3-2.6 - Will cause  difficulty in diuresing - Monitor closely with aggressive diuresis  A. Fib - rate controlled on cardizem- continue - likely too much of a fall risk for anticoagulation  HTN - well controlled on carvedilol and diltiazem - Continue current meds and monitor.   Hyperlipidemia - last LDL 51, 08/20/2013 - continue lipitor  Diabetes - LAst A1C 6.3, well controlled - Continue lantus- 10 units - SSI, careful with renal disease  Depression - at baseline, continue cymbalta  FEN/GI: SLIV, carb mod/heart healthy diet Prophylaxis: sq Heparin  Disposition: Tele for IV diuresis and close monitoring  History of Present Illness: [name] is a 80 y.o. female presenting with progressive dyspnea for 1 week. The patient and her daughter agree that she's had for respiratory status for several months. However, over the last week she's had progressive dyspnea worse with exertion which is what brought him to the clinic today. The patient states that she's unsure if she is having good diuresis with her home torsemide dose as she wears a diaper. She denies chest pain, fever, and states that she has a pretty good appetite. She does have an intermittent cough but states that she coughed up a large blood clot this morning. She denies ever smoking directly but her husband was a heavy smoker. She is an established patient of the heart failure clinic.  Review Of Systems: Per HPI, Otherwise 12 point review of systems was performed and was unremarkable.  Patient Active Problem List   Diagnosis Date Noted  . CHF exacerbation 09/02/2013  . Unspecified constipation 05/18/2013  . Abrasion  12/19/2012  . Acute on chronic diastolic heart failure 41/32/4401  . Wide-complex tachycardia 02/19/2012  . Gastric ulcer 11/02/2011  . Acute posthemorrhagic anemia 11/01/2011  . Coagulopathy 11/01/2011  . Chronic kidney disease (CKD), stage IV (severe) 07/20/2011  . GERD (gastroesophageal reflux disease) 04/24/2011  .  Diastolic CHF, chronic 02/72/5366  . Encounter for end of life care 04/12/2010  . SYNCOPE AND COLLAPSE 06/01/2009  . Hypertrophic obstructive cardiomyopathy(425.11) 10/14/2008  . PACEMAKER-St.Jude 10/14/2008  . MACULAR DEGENERATION, SENILE 09/22/2008  . CERVICALGIA 07/29/2008  . DIVERTICULOSIS, COLON 12/18/2007  . DIABETIC PERIPHERAL NEUROPATHY 03/10/2007  . UNSPECIFIED HEARING LOSS 01/20/2007  . HX, PERSONAL, MALIGNANCY, SKIN MELANOMA 10/21/2006  . ANEMIA NEC 06/13/2006  . DEGENERATION, LUMBAR/LUMBOSACRAL DISC 05/17/2006  . SCOLIOSIS, LUMBAR SPINE 05/17/2006  . ATRIAL FIBRILLATION 04/18/2006  . DIABETES MELLITUS, II, COMPLICATIONS 44/04/4740  . HYPERCHOLESTEROLEMIA 04/11/2006  . HYPERTRIGLYCERIDEMIA 04/11/2006  . DEPRESSIVE DISORDER, NOS 04/11/2006  . HYPERTENSION, BENIGN SYSTEMIC 04/11/2006  . MYOCARDIAL INFARCTION, OLD 04/11/2006  . CORONARY, ARTERIOSCLEROSIS 04/11/2006  . CVA 04/11/2006  . STASIS DERMATITIS 04/11/2006  . OSTEOARTHRITIS, MULTI SITES 04/11/2006  . INCONTINENCE/ENURESIS, NOS 04/11/2006   Past Medical History: Past Medical History  Diagnosis Date  . Cardiac pacemaker     Dr. Olevia Perches; St. Jude VVI  . Macular hole of left eye     told will lose sight  . NICM (nonischemic cardiomyopathy)   . Atrial fibrillation     off anticoagulation presumed to anemia  . Coronary atherosclerosis     and old MI and hx of CVA   . Depression   . Dysphagia   . DM (diabetes mellitus)     nueropathy  . CRI (chronic renal insufficiency)     cr basleine 1.5-1.6  . HTN (hypertension)   . HLD (hyperlipidemia)   . Incontinence   . CHF (congestive heart failure)     related to dyastolic dysfunction  . CAD (coronary artery disease)     nonobstructive  . Osteoarthritis   . Scoliosis   . Repeated falls     hx  . Stasis dermatitis   . Hearing loss   . Anemia   . CHF (congestive heart failure)     cardiacc ath EF 25-30%  . Permanent atrial fibrillation   . Cancer   .  Stroke   . Symptomatic bradycardia     sp PPM 1997   Past Surgical History: Past Surgical History  Procedure Laterality Date  . Bliateral foot surgery  08/19/01  . Bladder tack  08/19/01  . Hysterectomy and bso  08/19/01  . Melanoma excision  08/19/01  . Pacemaker placement  09/27/1995, 04/27/2005    SJM pacemaker implanted by Dr Olevia Perches, generator change 04/27/05  . Tonsillectomy    . Appendectomy    . Esophagogastroduodenoscopy  11/02/2011    Procedure: ESOPHAGOGASTRODUODENOSCOPY (EGD);  Surgeon: Irene Shipper, MD;  Location: [facility] ENDOSCOPY;  Service: Endoscopy;  Laterality: N/A;   Social History: History  Substance Use Topics  . Smoking status: Never Smoker   . Smokeless tobacco: Not on file     Comment: non smoker   . Alcohol Use: No   Additional social history: Please also refer to relevant sections of EMR.  Family History: Family History  Problem Relation Age of Onset  . Heart attack      family hx  . Colon cancer      family hx  . Stroke      family hx  . Diabetes  DM - family hx   . COPD Mother    Allergies and Medications: Allergies  Allergen Reactions  . Paroxetine Other (See Comments)    hallucinations and falls  . Codeine Phosphate Rash  . Hydrocodone Itching and Nausea Only    REACTION: nausea and pruritis  . Tramadol Hcl Hives and Other (See Comments)    Doesn't remember    No current facility-administered medications on file prior to encounter.   Current Outpatient Prescriptions on File Prior to Encounter  Medication Sig Dispense Refill  . acetaminophen (TYLENOL) 325 MG tablet Take 2 tablets (650 mg total) by mouth 3 (three) times daily.  180 tablet  0  . atorvastatin (LIPITOR) 10 MG tablet Take 1 tablet (10 mg total) by mouth daily.  90 tablet  3  . carvedilol (COREG) 25 MG tablet TAKE ONE TABLET BY MOUTH TWICE DAILY WITH MEALS  180 tablet  3  . diltiazem (CARTIA XT) 240 MG 24 hr capsule Take 1 capsule (240 mg total) by mouth daily.  90 capsule  3  .  DULoxetine (CYMBALTA) 30 MG capsule Take 1 capsule (30 mg total) by mouth daily.  90 capsule  3  . ferrous sulfate 325 (65 FE) MG tablet Take 1 tablet (325 mg total) by mouth 3 (three) times daily with meals.  100 tablet  6  . Insulin Glargine (LANTUS) 100 UNIT/ML Solostar Pen Inject 10 Units into the skin daily at 10 pm.  15 mL  11  . NON FORMULARY Oxygen; 2 L, Dx 428.22 86% on RA       . pantoprazole (PROTONIX) 40 MG tablet Take 1 tablet (40 mg total) by mouth daily at 6 (six) AM.  90 tablet  3  . polyethylene glycol powder (GLYCOLAX/MIRALAX) powder Take 17 g by mouth 2 (two) times daily as needed.  3350 g  1  . potassium chloride (K-DUR) 10 MEQ tablet Take 1 tablet (10 mEq total) by mouth 2 (two) times daily.  30 tablet  4  . torsemide (DEMADEX) 20 MG tablet 4 tablets (80mg ) in the AM and 2 tablets (40mg ) about 5PM  540 tablet  3    Objective: There were no vitals taken for this visit. Exam: Gen: NAD, alert, cooperative with exam, sitting in a wheelchair HEENT: NCAT, EOMI CV: RRR, good S1/S2, no murmur Resp: very poor air movement, no crackles, mildly labored, desat to 86 with O2 via [place] at rest Abd: SNTND, BS present, no guarding or organomegaly Ext: 1-2+ pitting edema BL LE, changes of chronic venous stasis Neuro: Alert and oriented X 3, No gross deficits   Labs and Imaging: CBC BMET  No results found for this basename: WBC, HGB, HCT, PLT,  in the last 168 hours No results found for this basename: NA, K, CL, CO2, BUN, CREATININE, GLUCOSE, CALCIUM,  in the last 168 hours   Labs pending  EKG and CT chest pending  Timmothy Euler, MD 09/02/2013, 12:15 PM PGY-3, [place] Intern pager: [phone], text pages welcome

## 2013-09-03 ENCOUNTER — Ambulatory Visit: Payer: PRIVATE HEALTH INSURANCE | Admitting: Cardiology

## 2013-09-03 ENCOUNTER — Encounter: Payer: Self-pay | Admitting: Family Medicine

## 2013-09-03 DIAGNOSIS — I2510 Atherosclerotic heart disease of native coronary artery without angina pectoris: Secondary | ICD-10-CM

## 2013-09-03 LAB — BASIC METABOLIC PANEL
Anion gap: 12 (ref 5–15)
Anion gap: 12 (ref 5–15)
BUN: 56 mg/dL — ABNORMAL HIGH (ref 6–23)
BUN: 58 mg/dL — ABNORMAL HIGH (ref 6–23)
CHLORIDE: 103 meq/L (ref 96–112)
CO2: 29 meq/L (ref 19–32)
CO2: 27 meq/L (ref 19–32)
CREATININE: 2.68 mg/dL — AB (ref 0.50–1.10)
CREATININE: 2.71 mg/dL — AB (ref 0.50–1.10)
Calcium: 8.5 mg/dL (ref 8.4–10.5)
Calcium: 8.8 mg/dL (ref 8.4–10.5)
Chloride: 102 mEq/L (ref 96–112)
GFR calc Af Amer: 18 mL/min — ABNORMAL LOW (ref >90)
GFR calc Af Amer: 18 mL/min — ABNORMAL LOW (ref >90)
GFR calc non Af Amer: 15 mL/min — ABNORMAL LOW (ref >90)
GFR calc non Af Amer: 16 mL/min — ABNORMAL LOW (ref >90)
Glucose, Bld: 153 mg/dL — ABNORMAL HIGH (ref 70–99)
Glucose, Bld: 169 mg/dL — ABNORMAL HIGH (ref 70–99)
Potassium: 3.9 mEq/L (ref 3.7–5.3)
Potassium: 4.3 mEq/L (ref 3.7–5.3)
Sodium: 141 mEq/L (ref 137–147)
Sodium: 144 mEq/L (ref 137–147)

## 2013-09-03 LAB — CBC
HEMATOCRIT: 25.5 % — AB (ref 36.0–46.0)
Hemoglobin: 7.8 g/dL — ABNORMAL LOW (ref 12.0–15.0)
MCH: 28.2 pg (ref 26.0–34.0)
MCHC: 30.6 g/dL (ref 30.0–36.0)
MCV: 92.1 fL (ref 78.0–100.0)
Platelets: 101 10*3/uL — ABNORMAL LOW (ref 150–400)
RBC: 2.77 MIL/uL — ABNORMAL LOW (ref 3.87–5.11)
RDW: 17.3 % — AB (ref 11.5–15.5)
WBC: 3.1 10*3/uL — ABNORMAL LOW (ref 4.0–10.5)

## 2013-09-03 LAB — GLUCOSE, CAPILLARY
GLUCOSE-CAPILLARY: 136 mg/dL — AB (ref 70–99)
Glucose-Capillary: 154 mg/dL — ABNORMAL HIGH (ref 70–99)
Glucose-Capillary: 112 mg/dL — ABNORMAL HIGH (ref 70–99)
Glucose-Capillary: 162 mg/dL — ABNORMAL HIGH (ref 70–99)
Glucose-Capillary: 113 mg/dL — ABNORMAL HIGH (ref 70–99)

## 2013-09-03 LAB — TROPONIN I: Troponin I: <0.3 ng/mL (ref <0.30)

## 2013-09-03 MED ORDER — FUROSEMIDE 10 MG/ML IJ SOLN
120.0000 mg | Freq: Four times a day (QID) | INTRAVENOUS | Status: DC
  Administered 2013-09-03 – 2013-09-05 (×8): 120 mg via INTRAVENOUS
  Filled 2013-09-03 (×11): qty 12

## 2013-09-03 NOTE — Patient Instructions (Signed)
Admitted to hospital

## 2013-09-03 NOTE — Progress Notes (Signed)
   Subjective:    Patient ID: [name], female    DOB: [birth year], 80 y.o.   MRN: [id number]  HPI Patient with longstanding hx of diastolic dysfunction CHF, Rt. Heart failure and CKD stage 4 presents with worsening leg swelling and marked DOE minimal exertion.  Having severe SOB with her bed to chair to bathroom existence.  No chest pain.  Does have increased leg redness.    Review of Systems     Objective:   Physical Exam VS noted.  Wt up 12 lbs from her presumed dry wt of 140.   Pulse ox at rest on 3L/min O2=87-88%.  However, it hit a low of 59% sat as she walked from the waiting room to the exam room.  Marked dyspnea at the time Lungs, bibasilar rales to mid thorax. Cardiac Irreg with 1/6 SEM Abd benign EXt 2+ pitting edema. Has petechial hemorrhages both lower legs classic for venous stasis/insufficiency.  No skin breakdown.        Assessment & Plan:

## 2013-09-03 NOTE — Assessment & Plan Note (Signed)
This will interfere with diuresis and makes prognosis serious.  She is not yet ready to embrace full palliative care but she is close.  Wants DNR.

## 2013-09-03 NOTE — Progress Notes (Signed)
FMTS Attending Daily Note:  Annabell Sabal MD  [phone] pager  Family Practice pager:  [phone] I have seen and examined this patient and have reviewed their chart. I have discussed this patient with the resident. I agree with the resident's findings, assessment and care plan.   Alveda Reasons, MD 09/03/2013 2:46 PM

## 2013-09-03 NOTE — Progress Notes (Signed)
Patient ID: [name], female   DOB: 07/10/1930, 80 y.o.   MRN: [id number]   SUBJECTIVE:  Patient did not diurese well overnight.  She remains short of breath.    Scheduled Meds: . acetaminophen  650 mg Oral TID  . aspirin EC  81 mg Oral Daily  . atorvastatin  10 mg Oral Daily  . carvedilol  25 mg Oral BID WC  . diltiazem  240 mg Oral Daily  . DULoxetine  30 mg Oral Daily  . ferrous sulfate  325 mg Oral TID WC  . furosemide  120 mg Intravenous Q6H  . heparin  5,000 Units Subcutaneous 3 times per day  . insulin aspart  0-9 Units Subcutaneous TID WC  . insulin glargine  10 Units Subcutaneous QHS  . pantoprazole  40 mg Oral Q0600  . potassium chloride  10 mEq Oral BID  . sodium chloride  3 mL Intravenous Q12H  . sodium chloride  3 mL Intravenous Q12H   Continuous Infusions:  PRN Meds:.sodium chloride, polyethylene glycol powder, sodium chloride    Filed Vitals:   09/03/13 0024 09/03/13 0529 09/03/13 0841 09/03/13 1008  BP:  143/71    Pulse:  63    Temp:  97.5 F (36.4 C)    TempSrc:  Oral    Resp:  20    Weight:  150 lb 6.8 oz (68.233 kg)    SpO2: 85% 90% 88% 91%    Intake/Output Summary (Last 24 hours) at 09/03/13 1204 Last data filed at 09/03/13 0903  Gross per 24 hour  Intake    700 ml  Output   1100 ml  Net   -400 ml    LABS: Basic Metabolic Panel:  Recent Labs  09/02/13 2345 09/03/13 0940  NA 141 144  K 3.9 4.3  CL 102 103  CO2 27 29  GLUCOSE 153* 169*  BUN 58* 56*  CREATININE 2.71* 2.68*  CALCIUM 8.5 8.8   Liver Function Tests:  Recent Labs  09/02/13 1354  AST 19  ALT 14  ALKPHOS 107  BILITOT 0.6  PROT 7.5  ALBUMIN 3.5   No results found for this basename: LIPASE, AMYLASE,  in the last 72 hours CBC:  Recent Labs  09/02/13 1354 09/02/13 2345  WBC 4.5 3.1*  HGB 8.7* 7.8*  HCT 29.1* 25.5*  MCV 92.1 92.1  PLT 131* 101*   Cardiac Enzymes:  Recent Labs  09/02/13 1354 09/02/13 1850 09/02/13 2345  TROPONINI <0.30 <0.30 <0.30     BNP: No components found with this basename: POCBNP,  D-Dimer: No results found for this basename: DDIMER,  in the last 72 hours Hemoglobin A1C: No results found for this basename: HGBA1C,  in the last 72 hours Fasting Lipid Panel: No results found for this basename: CHOL, HDL, LDLCALC, TRIG, CHOLHDL, LDLDIRECT,  in the last 72 hours Thyroid Function Tests:  Recent Labs  09/02/13 1354  TSH 3.780   Anemia Panel: No results found for this basename: VITAMINB12, FOLATE, FERRITIN, TIBC, IRON, RETICCTPCT,  in the last 72 hours  RADIOLOGY: Ct Chest Wo Contrast  09/02/2013   CLINICAL DATA:  Hemoptysis and shortness of breath.  EXAM: CT CHEST WITHOUT CONTRAST  TECHNIQUE: Multidetector CT imaging of the chest was performed following the standard protocol without IV contrast.  COMPARISON:  02/27/2012 and abdominal CT 10/05/2009  FINDINGS: There is a moderate amount of right pleural fluid. No significant pericardial fluid. The heart is markedly enlarged, particularly the left atrium and right  atrium. Patient has a right dual lead cardiac pacemaker with leads in the right atrium and right ventricle. There are mitral annular calcifications. Calcifications in the coronary arteries. Small amount of fluid in the superior pericardial recess. There may be prominent lymph nodes in the supraclavicular areas, particularly on the right side. No significant axillary lymphadenopathy. Prominent precarinal lymph node on sequence 2, image 15 measures 1.6 cm. Subcarinal tissue measures 1.4 cm in the short axis on sequence 2, image 23. Limited evaluation for hilar lymphadenopathy on this non-contrast examination. No significant left pleural fluid.  There is a small amount of perihepatic ascites. The liver contour is mildly nodular which raises concern for cirrhosis. There is indeterminate 1.7 cm low-density structure in the right hepatic lobe on sequence 2, image 53. This structure has not significantly changed since 2011  and probably represents a benign etiology such as a cyst. Again noted is a large calcification the upper abdomen which could represent a calcified node or vascular calcification. Questionable cyst along the right kidney upper pole. No gross abnormality to the adrenal tissue.  The trachea and mainstem bronchi are patent. There is interstitial thickening, particularly in the right upper lobe and superior segment of the right lower lobe. This could represent mild edema. There are areas of air trapping in the right upper lobe. Small punctate calcifications in the right upper lobe. Volume loss in the right lower lobe related to pleural fluid. Volume loss throughout the left lower lobe. No acute bone abnormality.  IMPRESSION: Cardiomegaly with a moderate-sized right pleural effusion.  Patchy interstitial and airspace densities in the right lung could represent a combination of asymmetric edema and atelectasis. Volume loss in the left lung.  Perihepatic ascites and concern for cirrhosis.  Mild mediastinal lymphadenopathy and possible supraclavicular lymphadenopathy. These findings are nonspecific.  Stable 1.7 cm low-density structure in the liver probably represents a benign etiology such as a cyst.   Electronically Signed   By: Markus Daft M.D.   On: 09/02/2013 13:57    PHYSICAL EXAM General: NAD  Neck: JVP 14 cm with prominent EJ, no thyromegaly or thyroid nodule.  Lungs: Decreased breath sounds right base, crackles left base.  CV: Nondisplaced PMI. Heart irregular S1/S2, no S3/S4, 1/6 SEM RUSB. 1+ edema 1/2 up lower legs. No carotid bruit.  Abdomen: Soft, nontender, no hepatosplenomegaly, no distention.  Skin: Petechial hemorrhages around the ankles.  Neurologic: Alert and oriented x 3.  Psych: Normal affect.  Extremities: No clubbing or cyanosis.   TELEMETRY: Reviewed telemetry pt in atrial fibrillation  ASSESSMENT AND PLAN: 80 yo with history of hypertrophic cardiomyopathy with chronic diastolic CHF,  chronic respiratory failure on home oxygen, chronic atrial fibrillation (not on warfarin due to GI bleeding and falls), [employer] PPM, and CKD with baseline poor functional status was admitted with increased dyspnea.  1. Hypertrophic cardiomyopathy: She has not had significant outflow tract obstruction on her recent echoes, but she does have diastolic CHF. Continue beta blocker and calcium channel blocker as these may be limiting outflow tract obstruction.  2. Acute on chronic diastolic CHF: She is quite volume overloaded on exam with very high BNP. She has chronic respiratory failure at baseline on home oxygen and is very limited. NYHA class IIIb symptoms at baseline, now NYHA class IV. Volume management is limited by CKD stage IV.  She did not diurese very well over night.  - Lasix dosing appropriately increased to 120 mg IV every 6 hrs.  If this fails to induce  diuresis and renal function remains relatively stable, next step would be addition of metolazone.   3. CKD stage IV: Follow creatinine with diuresis, so far it has been stable. I am concerned that volume management is going to be limited by her renal function.  4. Atrial fibrillation: Chronic. Rate controlled with Coreg and diltiazem CD. She is not on anticoagulation due to frequent falls in the past and history of GI bleeding.  5. Patient has overall poor prognosis and has poor function at baseline. Would avoid invasive procedures. She is appropriately DNR.   [name] 09/03/2013

## 2013-09-03 NOTE — Progress Notes (Signed)
Consult for chaplain to visit pt. Pt requested prayer from chaplain. Chaplain prayer with pt and provided spiritual encouragement.  Charyl Dancer, Chaplain  09/03/13 1100  Clinical Encounter Type  Visited With Patient  Visit Type Spiritual support  Referral From Nurse  Consult/Referral To Chaplain  Spiritual Encounters  Spiritual Needs Prayer  Stress Factors  Patient Stress Factors None identified  Family Stress Factors None identified

## 2013-09-03 NOTE — Discharge Summary (Signed)
[facility] Discharge Summary  Patient name: [name] Medical record number: [id number] Date of birth: [birth year] Age: 80 y.o. Gender: female Date of Admission: 09/02/2013  Date of Discharge: 09/06/13 Admitting Physician: Alveda Reasons, MD  Primary Care Provider: Zigmund Gottron, MD Consultants: Heart failure/Cardiology  Indication for Hospitalization: CHF exacerbation  Discharge Diagnoses/Problem List:  Acute exacerbation of chronic diastolic CHF 2/2 HOCM, s/p PPM CAD CKD stage IV A Fib HTN HLD T2DM Depression  Disposition: Home  Discharge Condition: Stable  Discharge Exam:  Gen: NAD, alert, cooperative with exam, sitting in a wheelchair  HEENT: NCAT, EOMI  CV: RRR, good S1/S2, no murmur  Resp: very poor air movement, no crackles, mildly labored, desat to 86 with O2 via [place] at rest  Abd: SNTND, BS present, no guarding or organomegaly  Ext: 1-2+ pitting edema BL LE, changes of chronic venous stasis  Neuro: Alert and oriented X 3, No gross deficits   Brief Hospital Course:  [name] is a 80 y.o. female presenting with CHF exacerbation complicated by CKD 4. PMH is significant for diastolic CHF s/p ppm, CAD, HOCM, DDD, CVA, A fib, HLD, DM2, DDD, and melanoma.  Acute exacerbation of chronic diastolic CHF 2/2 HOCM, s/p PPM with CAD: She came in to the [facility] with progressive dyspnea for ~1wk with an already poor respiratory status at baseline.  EKG non-ischemic, troponins neg x3. BNP was 34K on admission (was 1765 1 month ago). Heart failure team was consulted and followed throughout admission.  Diuresis was initiated with IV Lasix 80mg  BID, but was increased to IV Lasix 120mg  q6h when she had little response to the lower dose, especially given that this dose was no more potent than home dose of demedex.  Metolazone was added for synergistic effect with Lasix on 7/24.  Chest CT did show a moderate-sized R pleural effusion that could be  contributing to her SOB.  No invasive procedures were pursued as patient is nearing end of life.  Her home cardizem and statin were continued throughout admission.  Her Coreg dose was decreased to 12.5 BID after addition of metolazone to maximize CO while diuresing and as HR was in 60s.  O2 was titrated to keep sats >90%.  The idea of a palliative care consult for possible hospice was discussed with the patient, who adamantly declined such services at this time.  Respiratory status was much improved by day of discharge.  Patient was resumed on home torsemide and continued on Metolazone on day of discharge.  Hemoptysis: Pt reported coughing up a large blood clot on morning of admission.  CT Chest was not concerning for hemorrhage.  She did not experience any more hemoptysis during admission.   CKD stage IV : The patient's creatinine was monitored closely in the setting of aggressive diuresis.  Her Cr was at baseline of 2.6 on admission.  Creatinine was stable near baseline throughout admission, with highest value of 2.75   Atrial fibrillation : Patient was rate controlled throughout admission on Coreg and Cardizem.  Coreg dose decreased as described above.  Pt is not on any anticoagulation 2/2 h/o frequent falls and GI bleeding   T2DM: Patient noted to have AM hypoglycemia on Lantus.  Patient reported hypoglycemia regularly at home.  D/c'd all insulin on 7/24.  Left calf pain and tenderness: Patient reported L calf pain on 7/25.  Concern for DVT in setting of decreased mobility in hospital. Would not anticoagulate if it was a DVT,  though, given h/o frequent falls and GIB. Therefore, did not test for DVT. Pain controlled with scheduled tylenol. Stable on discharge.  All other chronic conditions were stable throughout admission and treated with home medications.  Issues for Follow Up:  1. F/u respiratory status and fluid status on home torsemide and metolazone. 2. F/u blood glucose off of all insulin  given hypoglycemia. 3. F/u L calf pain. Pain was controlled with scheduled tylenol 4. F/u rate control on decreased Coreg dose.  Significant Procedures: None  Significant Labs and Imaging:   Recent Labs Lab 09/04/13 1150 09/05/13 0325 09/06/13 0310  WBC 4.1 3.4* 4.3  HGB 8.9* 8.0* 8.0*  HCT 29.3* 26.0* 25.8*  PLT 112* 110* 113*    Recent Labs Lab 09/02/13 1354 09/02/13 2345 09/03/13 0940 09/04/13 1150 09/05/13 0325 09/06/13 0310  NA 142 141 144 144 144 143  K 4.1 3.9 4.3 4.0 3.7 3.6*  CL 100 102 103 100 99 95*  CO2 28 27 29 30 31  34*  GLUCOSE 120* 153* 169* 89 101* 121*  BUN 58* 58* 56* 56* 61* 67*  CREATININE 2.76* 2.71* 2.68* 2.61* 2.75* 2.74*  CALCIUM 9.2 8.5 8.8 9.0 9.1 9.0  ALKPHOS 107  --   --   --   --   --   AST 19  --   --   --   --   --   ALT 14  --   --   --   --   --   ALBUMIN 3.5  --   --   --   --   --    BNP (last 3 results)  Recent Labs  12/16/12 1228 08/06/13 1612 09/02/13 1354  PROBNP 717.0* 1765.0* 34272.0*    ECG (7/23) : Afib with Ventricular pacing, PVCs, no ischemic changes noted   CT Chest (7/22): Cardiomegaly with a moderate-sized right pleural effusion.  Patchy interstitial and airspace densities in the right lung could represent a combination of asymmetric edema and atelectasis. Volume loss in the left lung.  Perihepatic ascites and concern for cirrhosis.  Mild mediastinal lymphadenopathy and possible supraclavicular  lymphadenopathy. These findings are nonspecific.  Stable 1.7 cm low-density structure in the liver probably represents  a benign etiology such as a cyst.   Results/Tests Pending at Time of Discharge: None  Discharge Medications:    Medication List    STOP taking these medications       Insulin Glargine 100 UNIT/ML Solostar Pen  Commonly known as:  LANTUS      TAKE these medications       acetaminophen 325 MG tablet  Commonly known as:  TYLENOL  Take 650 mg by mouth every 6 (six) hours as needed for  moderate pain.     atorvastatin 10 MG tablet  Commonly known as:  LIPITOR  Take 10 mg by mouth every morning.     carvedilol 12.5 MG tablet  Commonly known as:  COREG  Take 1 tablet (12.5 mg total) by mouth 2 (two) times daily with a meal.     diltiazem 240 MG 24 hr capsule  Commonly known as:  CARTIA XT  Take 1 capsule (240 mg total) by mouth daily.     DULoxetine 30 MG capsule  Commonly known as:  CYMBALTA  Take 1 capsule (30 mg total) by mouth daily.     ferrous sulfate 325 (65 FE) MG tablet  Take 1 tablet (325 mg total) by mouth 3 (three) times daily with meals.  metolazone 5 MG tablet  Commonly known as:  ZAROXOLYN  Take 1 tablet (5 mg total) by mouth daily.     NON FORMULARY  Oxygen; 2 L, Dx 428.22 86% on RA     pantoprazole 40 MG tablet  Commonly known as:  PROTONIX  Take 1 tablet (40 mg total) by mouth daily at 6 (six) AM.     polyethylene glycol packet  Commonly known as:  MIRALAX / GLYCOLAX  Take 17 g by mouth daily as needed for moderate constipation.     potassium chloride 10 MEQ tablet  Commonly known as:  K-DUR  Take 1 tablet (10 mEq total) by mouth 2 (two) times daily.     torsemide 20 MG tablet  Commonly known as:  DEMADEX  Take 5 tablets (100 mg total) by mouth 2 (two) times daily.     torsemide 100 MG tablet  Commonly known as:  DEMADEX  Take 1 tablet (100 mg total) by mouth 2 (two) times daily.        Discharge Instructions: Please refer to Patient Instructions section of EMR for full details.  Patient was counseled important signs and symptoms that should prompt return to medical care, changes in medications, dietary instructions, activity restrictions, and follow up appointments.   Follow-Up Appointments: Follow-up Information   Call Zigmund Gottron, MD. (Call to make an appointment to see Dr. Andria Frames within the next week.)    Specialty:  Family Medicine   Contact information:   [place]  [address] [phone]       Lavon Paganini, MD 09/07/2013, 12:23 PM PGY-1, [place]

## 2013-09-03 NOTE — Progress Notes (Signed)
Family Medicine Teaching Service Daily Progress Note Intern Pager: [phone]  Patient name: [name] Medical record number: [id number] Date of birth: [birth year] Age: 80 y.o. Gender: female  Primary Care Provider: Zigmund Gottron, MD Consultants: Heart failure team/Cardiology Code Status: DNR  Pt Overview and Major Events to Date:  7/22 Admit for CHF exacerbation  Assessment and Plan:  [name] is a 80 y.o. female presenting with CHF exacerbation complicated by CKD 4. PMH is significant for diastolic CHF s/p ppm, CAD, HOCM, DDD, CVA, A fib, HLD, DM2, DDD, and melanoma.   Acute exacerbation of chronic diastolic CHF 2/2 HOCM, s/p PPM with CAD: EKG non-ischemic, troponins neg x3. BNP 34K on admission (was 1765 1 month ago). Continues to be dyspneic and desat O/N. - Diuresis will be limited by her severe CKD  - Heart failure team following - appreciate recommendations  - CT Chest with moderate-sized R pleural effusion.  Consider therapeutic thoracentesis. - hold home torsemide, Continue to diurese with IV lasix 80 BID - Continue home carvedilol, statin, cardizem  - O2 support to achieve sats of 90%  - UOP 948mL in last 24hrs. Continue to monitor strict Is&Os and daily weights  - Consider repeat Echo, last was 02/20/2012 with EF 55-60  - avoid positive inotropes like Digoxin with HOCM  - Pt adamantly does not want Palliative care consult  CKD stage IV : Stable.  Creatinine just above baseline of  2.3-2.6. Cr 2.71 today.  - Continue to monitor closely with aggressive diuresis   A. Fib : Stable. Rate controlled.  Not on anticoagulation 2/2 frequent falls in past and h/o GI bleeding - Continue Coreg and diltiazem CD  HTN : Well controlled on carvedilol and diltiazem  - Continue current meds and monitor.   Hyperlipidemia : last LDL 51, 08/20/2013  - continue lipitor   Diabetes : LAst A1C 6.3, well controlled  - Continue lantus- 10 units  - SSI, careful with renal disease    Depression  - at baseline, continue cymbalta   FEN/GI: SLIV, carb mod/heart healthy diet  Prophylaxis: sq Heparin   Disposition: Pending IV diuresis and clinical response   Subjective:  Pt reports that breathing is no different than on admission.  She could not sleep all night 2/2 interruptions and light coming in through the windows.  She wants to go home if we can not do anything further.  She adamantly declines a palliative care c/s.  Objective: Temp:  [97.5 F (36.4 C)-97.6 F (36.4 C)] 97.5 F (36.4 C) (07/23 0529) Pulse Rate:  [60-63] 63 (07/23 0529) Resp:  [20] 20 (07/23 0529) BP: (123-143)/(53-71) 143/71 mmHg (07/23 0529) SpO2:  [85 %-91 %] 91 % (07/23 1008) Weight:  [150 lb 6.8 oz (68.233 kg)] 150 lb 6.8 oz (68.233 kg) (07/23 0529) Physical Exam: Gen: NAD, alert, cooperative with exam, sitting in a wheelchair  HEENT: NCAT, EOMI  CV: RRR, good S1/S2, no murmur  Resp: very poor air movement, no crackles, mildly labored, desat to 86 with O2 via [place] at rest  Abd: SNTND, BS present, no guarding or organomegaly  Ext: 1-2+ pitting edema BL LE, changes of chronic venous stasis  Neuro: Alert and oriented X 3, No gross deficits   Laboratory:  Recent Labs Lab 09/02/13 1354 09/02/13 2345  WBC 4.5 3.1*  HGB 8.7* 7.8*  HCT 29.1* 25.5*  PLT 131* 101*    Recent Labs Lab 09/02/13 1354 09/02/13 2345 09/03/13 0940  NA 142 141 144  K  4.1 3.9 4.3  CL 100 102 103  CO2 28 27 29   BUN 58* 58* 56*  CREATININE 2.76* 2.71* 2.68*  CALCIUM 9.2 8.5 8.8  PROT 7.5  --   --   BILITOT 0.6  --   --   ALKPHOS 107  --   --   ALT 14  --   --   AST 19  --   --   GLUCOSE 120* 153* 169*    Imaging/Diagnostic Tests: ECG (7/23) : Afib with Ventricular pacing, PVCs, no ischemic changes noted  CT Chest (7/22): Cardiomegaly with a moderate-sized right pleural effusion.  Patchy interstitial and airspace densities in the right lung could represent a combination of asymmetric edema and  atelectasis. Volume loss in the left lung.  Perihepatic ascites and concern for cirrhosis.  Mild mediastinal lymphadenopathy and possible supraclavicular  lymphadenopathy. These findings are nonspecific.  Stable 1.7 cm low-density structure in the liver probably represents  a benign etiology such as a cyst.   Lavon Paganini, MD 09/03/2013, 1:27 PM PGY-1, [place] Intern pager: [phone], text pages welcome

## 2013-09-03 NOTE — Progress Notes (Signed)
At about 0020 pt had 7 beats of Vtach. Pt denied symptoms. BP stable. MD on call made aware. No new orders. Cont to monitor.

## 2013-09-03 NOTE — Progress Notes (Signed)
Seen and examined.  Appreciate cards help.  Adjustments. 1. CHF/volume overload.  Increased lasix to 120 iv q6h.  The 80 iv bid likely is no more potent than her home demedex.  Need to push and follow daily creat. 2. CKD follow daily creat as we push diuresis. 3. End of life.  DNR.  We discussed whether she would want hospice services at [place] - I believe she would qualify as hospice eligible.  She stated that she does fine at home and does not want hospice services at this time.    On the positive note, she felt fine this morning as she sat up on the side of the bed eating breakfast.

## 2013-09-03 NOTE — Assessment & Plan Note (Signed)
Will admit to try for diuresis and tweak meds.  Cards consult.

## 2013-09-03 NOTE — Progress Notes (Signed)
Pts sats ranging between 84-86% mainly and reached 87% on 4L [place]. Pt denies any increase in her SOB. States it is "about the same". MD on call made aware. No new orders. Cont to monitor.

## 2013-09-04 LAB — BASIC METABOLIC PANEL
Anion gap: 14 (ref 5–15)
BUN: 56 mg/dL — ABNORMAL HIGH (ref 6–23)
CALCIUM: 9 mg/dL (ref 8.4–10.5)
CO2: 30 meq/L (ref 19–32)
CREATININE: 2.61 mg/dL — AB (ref 0.50–1.10)
Chloride: 100 mEq/L (ref 96–112)
GFR calc Af Amer: 19 mL/min — ABNORMAL LOW (ref >90)
GFR, EST NON AFRICAN AMERICAN: 16 mL/min — AB (ref >90)
Glucose, Bld: 89 mg/dL (ref 70–99)
Potassium: 4 mEq/L (ref 3.7–5.3)
Sodium: 144 mEq/L (ref 137–147)

## 2013-09-04 LAB — GLUCOSE, CAPILLARY
GLUCOSE-CAPILLARY: 57 mg/dL — AB (ref 70–99)
GLUCOSE-CAPILLARY: 81 mg/dL (ref 70–99)
Glucose-Capillary: 81 mg/dL (ref 70–99)
Glucose-Capillary: 112 mg/dL — ABNORMAL HIGH (ref 70–99)
Glucose-Capillary: 177 mg/dL — ABNORMAL HIGH (ref 70–99)

## 2013-09-04 LAB — CBC
HCT: 29.3 % — ABNORMAL LOW (ref 36.0–46.0)
Hemoglobin: 8.9 g/dL — ABNORMAL LOW (ref 12.0–15.0)
MCH: 28.5 pg (ref 26.0–34.0)
MCHC: 30.4 g/dL (ref 30.0–36.0)
MCV: 93.9 fL (ref 78.0–100.0)
PLATELETS: 112 10*3/uL — AB (ref 150–400)
RBC: 3.12 MIL/uL — AB (ref 3.87–5.11)
RDW: 17.1 % — ABNORMAL HIGH (ref 11.5–15.5)
WBC: 4.1 10*3/uL (ref 4.0–10.5)

## 2013-09-04 MED ORDER — CARVEDILOL 12.5 MG PO TABS
12.5000 mg | ORAL_TABLET | Freq: Two times a day (BID) | ORAL | Status: DC
  Administered 2013-09-04 – 2013-09-06 (×4): 12.5 mg via ORAL
  Filled 2013-09-04 (×6): qty 1

## 2013-09-04 MED ORDER — METOLAZONE 5 MG PO TABS
5.0000 mg | ORAL_TABLET | Freq: Every day | ORAL | Status: AC
  Administered 2013-09-04 – 2013-09-06 (×3): 5 mg via ORAL
  Filled 2013-09-04 (×3): qty 1

## 2013-09-04 NOTE — Progress Notes (Signed)
FMTS Attending Daily Note:  Lindsey Sabal MD  [phone] pager  Family Practice pager:  [phone] I have seen and examined this patient and have reviewed their chart. I have discussed this patient with the resident. I agree with the resident's findings, assessment and care plan.  Additionally:  Subjectively better today.  Unclear how much output she's truly had.  Less dyspnea when moving to bedside commode.  Plan is to continue IV diuretics today and see how she's doing tomorrow.  May be able to transition to PO medications tomorrow and home.  Alveda Reasons, MD 09/04/2013 3:53 PM

## 2013-09-04 NOTE — Progress Notes (Signed)
Patient ID: [name], female   DOB: 12/06/1930, 80 y.o.   MRN: [id number]   SUBJECTIVE:  Breathing better today.  She says she urinated more though this is not reflected in the I/Os.  Apparently she has a fair amount of incontinence.  She was weighed on the bed today so not certain of the accuracy of the weights.    Scheduled Meds: . acetaminophen  650 mg Oral TID  . aspirin EC  81 mg Oral Daily  . carvedilol  12.5 mg Oral BID WC  . diltiazem  240 mg Oral Daily  . DULoxetine  30 mg Oral Daily  . furosemide  120 mg Intravenous Q6H  . heparin  5,000 Units Subcutaneous 3 times per day  . metolazone  5 mg Oral Daily  . pantoprazole  40 mg Oral Q0600  . potassium chloride  10 mEq Oral BID  . sodium chloride  3 mL Intravenous Q12H  . sodium chloride  3 mL Intravenous Q12H   Continuous Infusions:  PRN Meds:.sodium chloride, polyethylene glycol powder, sodium chloride    Filed Vitals:   09/03/13 1716 09/03/13 2028 09/04/13 0500 09/04/13 0926  BP: 136/66 124/57 143/59 130/80  Pulse: 60 75 63   Temp:  97.5 F (36.4 C) 97.5 F (36.4 C)   TempSrc:  Oral Oral   Resp:  16 16   Weight:   153 lb 8 oz (69.627 kg)   SpO2: 93% 92% 90%     Intake/Output Summary (Last 24 hours) at 09/04/13 1255 Last data filed at 09/04/13 0930  Gross per 24 hour  Intake    720 ml  Output   1100 ml  Net   -380 ml    LABS: Basic Metabolic Panel:  Recent Labs  09/02/13 2345 09/03/13 0940  NA 141 144  K 3.9 4.3  CL 102 103  CO2 27 29  GLUCOSE 153* 169*  BUN 58* 56*  CREATININE 2.71* 2.68*  CALCIUM 8.5 8.8   Liver Function Tests:  Recent Labs  09/02/13 1354  AST 19  ALT 14  ALKPHOS 107  BILITOT 0.6  PROT 7.5  ALBUMIN 3.5   No results found for this basename: LIPASE, AMYLASE,  in the last 72 hours CBC:  Recent Labs  09/02/13 2345 09/04/13 1150  WBC 3.1* 4.1  HGB 7.8* 8.9*  HCT 25.5* 29.3*  MCV 92.1 93.9  PLT 101* 112*   Cardiac Enzymes:  Recent Labs  09/02/13 1354  09/02/13 1850 09/02/13 2345  TROPONINI <0.30 <0.30 <0.30   BNP: No components found with this basename: POCBNP,  D-Dimer: No results found for this basename: DDIMER,  in the last 72 hours Hemoglobin A1C: No results found for this basename: HGBA1C,  in the last 72 hours Fasting Lipid Panel: No results found for this basename: CHOL, HDL, LDLCALC, TRIG, CHOLHDL, LDLDIRECT,  in the last 72 hours Thyroid Function Tests:  Recent Labs  09/02/13 1354  TSH 3.780   Anemia Panel: No results found for this basename: VITAMINB12, FOLATE, FERRITIN, TIBC, IRON, RETICCTPCT,  in the last 72 hours  RADIOLOGY: Ct Chest Wo Contrast  09/02/2013   CLINICAL DATA:  Hemoptysis and shortness of breath.  EXAM: CT CHEST WITHOUT CONTRAST  TECHNIQUE: Multidetector CT imaging of the chest was performed following the standard protocol without IV contrast.  COMPARISON:  02/27/2012 and abdominal CT 10/05/2009  FINDINGS: There is a moderate amount of right pleural fluid. No significant pericardial fluid. The heart is markedly enlarged, particularly the  left atrium and right atrium. Patient has a right dual lead cardiac pacemaker with leads in the right atrium and right ventricle. There are mitral annular calcifications. Calcifications in the coronary arteries. Small amount of fluid in the superior pericardial recess. There may be prominent lymph nodes in the supraclavicular areas, particularly on the right side. No significant axillary lymphadenopathy. Prominent precarinal lymph node on sequence 2, image 15 measures 1.6 cm. Subcarinal tissue measures 1.4 cm in the short axis on sequence 2, image 23. Limited evaluation for hilar lymphadenopathy on this non-contrast examination. No significant left pleural fluid.  There is a small amount of perihepatic ascites. The liver contour is mildly nodular which raises concern for cirrhosis. There is indeterminate 1.7 cm low-density structure in the right hepatic lobe on sequence 2, image  53. This structure has not significantly changed since 2011 and probably represents a benign etiology such as a cyst. Again noted is a large calcification the upper abdomen which could represent a calcified node or vascular calcification. Questionable cyst along the right kidney upper pole. No gross abnormality to the adrenal tissue.  The trachea and mainstem bronchi are patent. There is interstitial thickening, particularly in the right upper lobe and superior segment of the right lower lobe. This could represent mild edema. There are areas of air trapping in the right upper lobe. Small punctate calcifications in the right upper lobe. Volume loss in the right lower lobe related to pleural fluid. Volume loss throughout the left lower lobe. No acute bone abnormality.  IMPRESSION: Cardiomegaly with a moderate-sized right pleural effusion.  Patchy interstitial and airspace densities in the right lung could represent a combination of asymmetric edema and atelectasis. Volume loss in the left lung.  Perihepatic ascites and concern for cirrhosis.  Mild mediastinal lymphadenopathy and possible supraclavicular lymphadenopathy. These findings are nonspecific.  Stable 1.7 cm low-density structure in the liver probably represents a benign etiology such as a cyst.   Electronically Signed   By: Markus Daft M.D.   On: 09/02/2013 13:57    PHYSICAL EXAM General: NAD  Neck: JVP 12 cm with prominent EJ, no thyromegaly or thyroid nodule.  Lungs: Decreased breath sounds right base, crackles left base.  CV: Nondisplaced PMI. Heart irregular S1/S2, no S3/S4, 1/6 SEM RUSB. 1+ ankle edema. No carotid bruit.  Abdomen: Soft, nontender, no hepatosplenomegaly, no distention.  Skin: Petechial hemorrhages around the ankles.  Neurologic: Alert and oriented x 3.  Psych: Normal affect.  Extremities: No clubbing or cyanosis.   TELEMETRY: Reviewed telemetry pt in atrial fibrillation  ASSESSMENT AND PLAN: 80 yo with history of  hypertrophic cardiomyopathy with chronic diastolic CHF, chronic respiratory failure on home oxygen, chronic atrial fibrillation (not on warfarin due to GI bleeding and falls), St Jude PPM, and CKD with baseline poor functional status was admitted with increased dyspnea.  1. Hypertrophic cardiomyopathy: She has not had significant outflow tract obstruction on her recent echoes, but she does have diastolic CHF. Continue beta blocker and calcium channel blocker as these may be limiting outflow tract obstruction.  2. Acute on chronic diastolic CHF: Volume actually looks better with less edema. Hard to tell how much output she really had as she has a fair amount of incontinence and she was weighed on the bed.  She is subjectively breathing better. - Continue current Lasix + metolazone today.  - Awaiting BMET - Weigh standing up please.  3. CKD stage IV: Awaiting the return of today's BMET.  I am concerned that volume  management is going to be limited by her renal function.  4. Atrial fibrillation: Chronic. Rate controlled with Coreg and diltiazem CD. She is not on anticoagulation due to frequent falls in the past and history of GI bleeding.  5. Patient has overall poor prognosis and has poor function at baseline. Would avoid invasive procedures. She is appropriately DNR.   [name] 09/04/2013

## 2013-09-04 NOTE — Progress Notes (Signed)
Family Medicine Teaching Service Daily Progress Note Intern Pager: [phone]  Patient name: [name] Medical record number: [id number] Date of birth: 1930/09/11 Age: 80 y.o. Gender: female  Primary Care Provider: Zigmund Gottron, MD Consultants: Heart failure team/Cardiology Code Status: DNR  Pt Overview and Major Events to Date:  7/22 Admit for CHF exacerbation  Assessment and Plan:  [name] is a 80 y.o. female presenting with CHF exacerbation complicated by CKD 4. PMH is significant for diastolic CHF s/p ppm, CAD, HOCM, DDD, CVA, A fib, HLD, DM2, DDD, and melanoma.   Acute exacerbation of chronic diastolic CHF 2/2 HOCM, s/p PPM with CAD: EKG non-ischemic, troponins neg x3. BNP 34K on admission (was 1765 1 month ago). Continues to be dyspneic and desat O/N. - Diuresis will be limited by her severe CKD  - Heart failure team following - appreciate recommendations  - CT Chest with moderate-sized R pleural effusion. - Hold home torsemide, Continue to diurese with IV lasix 120 q6h. Add metolazone 5mg  daily x3d. - Decrease dose of home Coreg from 25 BID to 12.5 BID to maximize CO while aggressively diuresing. Goal HR ~80. - Continue home statin, cardizem  - O2 support to achieve sats of 90%  - UOP 1070mL in last 24hrs (+88mL for the day), though does report 5-7 wet diapers O/N that were not recorded as output. Weight up 3 lbs from yesterday.  Continue to monitor strict Is&Os and daily weights  - Consider repeat Echo, last was 02/20/2012 with EF 55-60  - avoid positive inotropes like Digoxin with HOCM  - Pt adamantly does not want Palliative care consult  CKD stage IV : Stable.  Creatinine just above baseline of  2.3-2.6. Cr 2.68 yesterday.  - Continue to monitor closely with aggressive diuresis  - Repeat BMET pending today  A. Fib : Stable. Rate controlled.  Not on anticoagulation 2/2 frequent falls in past and h/o GI bleeding - Continue Coreg and diltiazem CD  HTN : Well  controlled on carvedilol and diltiazem  - Continue current meds and monitor.   Hyperlipidemia : last LDL 51, 08/20/2013  - continue home lipitor   Diabetes : Last A1C 6.3, well controlled. CBGs 57-162 O/N.  Patient reports low BG nearly every morning at home. - D/c Lantus and SSI, especially given that patient is at end of life.   Depression :  at baseline, continue home cymbalta   FEN/GI: SLIV, carb mod/heart healthy diet  Prophylaxis: sq Heparin   Disposition: Pending IV diuresis and clinical response   Subjective:  Pt reports that breathing is improved. She wants to go home if we can not do anything further, but is amenable to staying inpatient if we can continue to get fluid off and improve her breathing further.  Denies any further hemoptysis.  Objective: Temp:  [97.5 F (36.4 C)] 97.5 F (36.4 C) (07/24 0500) Pulse Rate:  [60-75] 63 (07/24 0500) Resp:  [16] 16 (07/24 0500) BP: (124-143)/(57-66) 143/59 mmHg (07/24 0500) SpO2:  [88 %-93 %] 90 % (07/24 0500) Weight:  [153 lb 8 oz (69.627 kg)] 153 lb 8 oz (69.627 kg) (07/24 0500) Physical Exam: Gen: NAD, alert, cooperative with exam, sitting in bed  HEENT: NCAT, EOMI  CV: RRR, good S1/S2, no murmur  Resp: Very poor air movement.  No breath sounds heard in R base.  Mild crackles in L base. Normal WOB.  Abd: SNTND, BS present, no guarding or organomegaly  Ext: 1-2+ pitting edema BL LE, changes  of chronic venous stasis  Neuro: Alert and oriented X 3, No gross deficits   Laboratory:  Recent Labs Lab 09/02/13 1354 09/02/13 2345  WBC 4.5 3.1*  HGB 8.7* 7.8*  HCT 29.1* 25.5*  PLT 131* 101*    Recent Labs Lab 09/02/13 1354 09/02/13 2345 09/03/13 0940  NA 142 141 144  K 4.1 3.9 4.3  CL 100 102 103  CO2 28 27 29   BUN 58* 58* 56*  CREATININE 2.76* 2.71* 2.68*  CALCIUM 9.2 8.5 8.8  PROT 7.5  --   --   BILITOT 0.6  --   --   ALKPHOS 107  --   --   ALT 14  --   --   AST 19  --   --   GLUCOSE 120* 153* 169*     Imaging/Diagnostic Tests: ECG (7/23) : Afib with Ventricular pacing, PVCs, no ischemic changes noted  CT Chest (7/22): Cardiomegaly with a moderate-sized right pleural effusion.  Patchy interstitial and airspace densities in the right lung could represent a combination of asymmetric edema and atelectasis. Volume loss in the left lung.  Perihepatic ascites and concern for cirrhosis.  Mild mediastinal lymphadenopathy and possible supraclavicular  lymphadenopathy. These findings are nonspecific.  Stable 1.7 cm low-density structure in the liver probably represents  a benign etiology such as a cyst.   Lavon Paganini, MD 09/04/2013, 8:06 AM PGY-1, [place] Intern pager: [phone], text pages welcome

## 2013-09-05 DIAGNOSIS — I6789 Other cerebrovascular disease: Secondary | ICD-10-CM

## 2013-09-05 DIAGNOSIS — Z950 Presence of cardiac pacemaker: Secondary | ICD-10-CM

## 2013-09-05 LAB — BASIC METABOLIC PANEL
Anion gap: 14 (ref 5–15)
BUN: 61 mg/dL — AB (ref 6–23)
CHLORIDE: 99 meq/L (ref 96–112)
CO2: 31 mEq/L (ref 19–32)
Calcium: 9.1 mg/dL (ref 8.4–10.5)
Creatinine, Ser: 2.75 mg/dL — ABNORMAL HIGH (ref 0.50–1.10)
GFR calc Af Amer: 18 mL/min — ABNORMAL LOW (ref >90)
GFR calc non Af Amer: 15 mL/min — ABNORMAL LOW (ref >90)
Glucose, Bld: 101 mg/dL — ABNORMAL HIGH (ref 70–99)
POTASSIUM: 3.7 meq/L (ref 3.7–5.3)
Sodium: 144 mEq/L (ref 137–147)

## 2013-09-05 LAB — CBC
HEMATOCRIT: 26 % — AB (ref 36.0–46.0)
Hemoglobin: 8 g/dL — ABNORMAL LOW (ref 12.0–15.0)
MCH: 28.2 pg (ref 26.0–34.0)
MCHC: 30.8 g/dL (ref 30.0–36.0)
MCV: 91.5 fL (ref 78.0–100.0)
Platelets: 110 10*3/uL — ABNORMAL LOW (ref 150–400)
RBC: 2.84 MIL/uL — ABNORMAL LOW (ref 3.87–5.11)
RDW: 17.1 % — ABNORMAL HIGH (ref 11.5–15.5)
WBC: 3.4 10*3/uL — AB (ref 4.0–10.5)

## 2013-09-05 LAB — GLUCOSE, CAPILLARY
GLUCOSE-CAPILLARY: 133 mg/dL — AB (ref 70–99)
GLUCOSE-CAPILLARY: 163 mg/dL — AB (ref 70–99)
Glucose-Capillary: 135 mg/dL — ABNORMAL HIGH (ref 70–99)
Glucose-Capillary: 113 mg/dL — ABNORMAL HIGH (ref 70–99)

## 2013-09-05 MED ORDER — CARVEDILOL 12.5 MG PO TABS: 12.5000 mg | ORAL_TABLET | Freq: Two times a day (BID) | ORAL | Status: DC

## 2013-09-05 MED ORDER — FUROSEMIDE 10 MG/ML IJ SOLN
120.0000 mg | Freq: Two times a day (BID) | INTRAMUSCULAR | Status: DC
  Administered 2013-09-05: 120 mg via INTRAVENOUS
  Filled 2013-09-05 (×3): qty 12

## 2013-09-05 MED ORDER — MORPHINE SULFATE 15 MG PO TABS: 15.0000 mg | ORAL_TABLET | ORAL | Status: DC | PRN

## 2013-09-05 MED ORDER — METOLAZONE 5 MG PO TABS: 5.0000 mg | ORAL_TABLET | Freq: Every day | ORAL | Status: DC

## 2013-09-05 NOTE — Progress Notes (Signed)
Patient ID: JOEI FRANGOS, female   DOB: 03-02-1930, 80 y.o.   MRN: [id number]   SUBJECTIVE:  Breathing better today.  She is still SOB and complains of swelling in her legs that is painful. Wants to go home.  Scheduled Meds: . acetaminophen  650 mg Oral TID  . aspirin EC  81 mg Oral Daily  . carvedilol  12.5 mg Oral BID WC  . diltiazem  240 mg Oral Daily  . DULoxetine  30 mg Oral Daily  . furosemide  120 mg Intravenous Q6H  . heparin  5,000 Units Subcutaneous 3 times per day  . metolazone  5 mg Oral Daily  . pantoprazole  40 mg Oral Q0600  . potassium chloride  10 mEq Oral BID  . sodium chloride  3 mL Intravenous Q12H   Continuous Infusions:  PRN Meds:.sodium chloride, polyethylene glycol powder, sodium chloride    Filed Vitals:   09/04/13 2048 09/05/13 0514 09/05/13 0550 09/05/13 1041  BP: 122/58 148/77  140/70  Pulse: 65 63    Temp: 97.4 F (36.3 C) 97.8 F (36.6 C)    TempSrc: Oral Oral    Resp: 18 16    Height:      Weight:   144 lb 1.6 oz (65.363 kg)   SpO2: 91% 90%      Intake/Output Summary (Last 24 hours) at 09/05/13 1213 Last data filed at 09/05/13 8889  Gross per 24 hour  Intake    952 ml  Output   1125 ml  Net   -173 ml    LABS: Basic Metabolic Panel:  Recent Labs  09/04/13 1150 09/05/13 0325  NA 144 144  K 4.0 3.7  CL 100 99  CO2 30 31  GLUCOSE 89 101*  BUN 56* 61*  CREATININE 2.61* 2.75*  CALCIUM 9.0 9.1   Liver Function Tests:  Recent Labs  09/02/13 1354  AST 19  ALT 14  ALKPHOS 107  BILITOT 0.6  PROT 7.5  ALBUMIN 3.5   No results found for this basename: LIPASE, AMYLASE,  in the last 72 hours CBC:  Recent Labs  09/04/13 1150 09/05/13 0325  WBC 4.1 3.4*  HGB 8.9* 8.0*  HCT 29.3* 26.0*  MCV 93.9 91.5  PLT 112* 110*   Cardiac Enzymes:  Recent Labs  09/02/13 1354 09/02/13 1850 09/02/13 2345  TROPONINI <0.30 <0.30 <0.30   BNP: No components found with this basename: POCBNP,  D-Dimer: No results found for this  basename: DDIMER,  in the last 72 hours Hemoglobin A1C: No results found for this basename: HGBA1C,  in the last 72 hours Fasting Lipid Panel: No results found for this basename: CHOL, HDL, LDLCALC, TRIG, CHOLHDL, LDLDIRECT,  in the last 72 hours Thyroid Function Tests:  Recent Labs  09/02/13 1354  TSH 3.780   Anemia Panel: No results found for this basename: VITAMINB12, FOLATE, FERRITIN, TIBC, IRON, RETICCTPCT,  in the last 72 hours  RADIOLOGY: Ct Chest Wo Contrast  09/02/2013   CLINICAL DATA:  Hemoptysis and shortness of breath.  EXAM: CT CHEST WITHOUT CONTRAST  TECHNIQUE: Multidetector CT imaging of the chest was performed following the standard protocol without IV contrast.  COMPARISON:  02/27/2012 and abdominal CT 10/05/2009  FINDINGS: There is a moderate amount of right pleural fluid. No significant pericardial fluid. The heart is markedly enlarged, particularly the left atrium and right atrium. Patient has a right dual lead cardiac pacemaker with leads in the right atrium and right ventricle. There are mitral annular  calcifications. Calcifications in the coronary arteries. Small amount of fluid in the superior pericardial recess. There may be prominent lymph nodes in the supraclavicular areas, particularly on the right side. No significant axillary lymphadenopathy. Prominent precarinal lymph node on sequence 2, image 15 measures 1.6 cm. Subcarinal tissue measures 1.4 cm in the short axis on sequence 2, image 23. Limited evaluation for hilar lymphadenopathy on this non-contrast examination. No significant left pleural fluid.  There is a small amount of perihepatic ascites. The liver contour is mildly nodular which raises concern for cirrhosis. There is indeterminate 1.7 cm low-density structure in the right hepatic lobe on sequence 2, image 53. This structure has not significantly changed since 2011 and probably represents a benign etiology such as a cyst. Again noted is a large calcification  the upper abdomen which could represent a calcified node or vascular calcification. Questionable cyst along the right kidney upper pole. No gross abnormality to the adrenal tissue.  The trachea and mainstem bronchi are patent. There is interstitial thickening, particularly in the right upper lobe and superior segment of the right lower lobe. This could represent mild edema. There are areas of air trapping in the right upper lobe. Small punctate calcifications in the right upper lobe. Volume loss in the right lower lobe related to pleural fluid. Volume loss throughout the left lower lobe. No acute bone abnormality.  IMPRESSION: Cardiomegaly with a moderate-sized right pleural effusion.  Patchy interstitial and airspace densities in the right lung could represent a combination of asymmetric edema and atelectasis. Volume loss in the left lung.  Perihepatic ascites and concern for cirrhosis.  Mild mediastinal lymphadenopathy and possible supraclavicular lymphadenopathy. These findings are nonspecific.  Stable 1.7 cm low-density structure in the liver probably represents a benign etiology such as a cyst.   Electronically Signed   By: Markus Daft M.D.   On: 09/02/2013 13:57    PHYSICAL EXAM General: NAD  Neck: JVP 12 cm with prominent EJ, no thyromegaly or thyroid nodule.  Lungs: Crackles left base.  CV: Nondisplaced PMI. Heart irregular S1/S2, no S3/S4, 1/6 SEM RUSB. tr ankle edema. No carotid bruit.  Abdomen: Soft, nontender, no hepatosplenomegaly, no distention.  Skin: Petechial hemorrhages around the ankles.  Neurologic: Alert and oriented x 3.  Psych: Normal affect.  Extremities: No clubbing or cyanosis.   TELEMETRY: Reviewed telemetry pt in atrial fibrillation  ASSESSMENT AND PLAN: 80 yo with history of hypertrophic cardiomyopathy with chronic diastolic CHF, chronic respiratory failure on home oxygen, chronic atrial fibrillation (not on warfarin due to GI bleeding and falls), St Jude PPM, and CKD  with baseline poor functional status was admitted with increased dyspnea.  1. Hypertrophic cardiomyopathy: She has not had significant outflow tract obstruction on her recent echoes, but she does have diastolic CHF. Continue beta blocker and calcium channel blocker as these may be limiting outflow tract obstruction.  2. Acute on chronic diastolic CHF: Volume actually looks better with less edema. Weight is down 6 lbs since admission.  She is subjectively breathing better. Still on high dose IV lasix. - Continue current Lasix IV but reduce to bid + metolazone today. Possibly transition to oral diuretics tomorrow. - Follow BMET - Weigh standing up please.  3. CKD stage IV: BMET is stable. BUN is increasing with diuresis 4. Atrial fibrillation: Chronic. Rate controlled with Coreg and diltiazem CD. She is not on anticoagulation due to frequent falls in the past and history of GI bleeding.  5. Patient has overall poor prognosis and  has poor function at baseline. Would avoid invasive procedures. She is appropriately DNR.   Collier [facility] 09/05/2013

## 2013-09-05 NOTE — Progress Notes (Signed)
Family Medicine Teaching Service Daily Progress Note Intern Pager: [phone]  Patient name: [name] Medical record number: [id number] Date of birth: 11-04-1930 Age: 80 y.o. Gender: female  Primary Care Provider: Zigmund Gottron, MD Consultants: Heart failure team/Cardiology Code Status: DNR  Pt Overview and Major Events to Date:  7/22 Admit for CHF exacerbation  Assessment and Plan:  [name] is a 80 y.o. female presenting with CHF exacerbation complicated by CKD 4.  PMH is significant for diastolic CHF s/p ppm, CAD, HOCM, DDD, CVA, A fib, HLD, DM2, DDD, and melanoma.   Acute exacerbation of chronic diastolic CHF 2/2 HOCM, s/p PPM with CAD: EKG non-ischemic, troponins neg x3. BNP 34K on admission (was 1765 1 month ago). CT Chest with moderate-sized R pleural effusion.  - Heart failure team following - appreciate recommendations  - Transition to home torsemide, metolazone 5mg  qday  - Continue Coreg and home statin and cardizem  - O2 support to achieve sats of 90%  - UOP 1366mL in last 24hrs, and several wet diapers O/N that were not recorded as output.  - Weight down 3 lbs from yesterday's standing weight.  Continue to monitor strict Is&Os and daily standing weights  - avoid positive inotropes like Digoxin with HOCM  - Pt adamantly does not want Palliative care consult, but will avoid invasive procedures  CKD stage IV : Stable.  Creatinine baseline 2.3-2.6. Cr 2.75 today.  - Continue to monitor closely with aggressive diuresis   Left calf pain and tenderness: Concern for DVT in setting of decreased mobility in hospital.  Would not anticoagulate if it was a DVT, though, given h/o frequent falls and GIB.  Therefore, will not test for DVT. - Scheduled tylenol for pain control  A. Fib : Stable. Rate controlled.  Not on anticoagulation 2/2 frequent falls in past and h/o GI bleeding - Continue Coreg and diltiazem CD  HTN : Well controlled on carvedilol and diltiazem.   Continue current meds and monitor.   Hyperlipidemia : last LDL 51, 08/20/2013.  Continue home lipitor   Diabetes : Last A1C 6.3, well controlled. D/c'd all CBGs and insulin on 7/24, as patient was having hypoglycemia and is nearing end of life.   Depression :  at baseline, continue home cymbalta   Normocytic anemia: Stable. Hgb around baseline of 8-9.  Likely related to CKD.  Continue to monitor.  FEN/GI: SLIV, carb mod/heart healthy diet  Prophylaxis: sq Heparin   Disposition: Possibly home today.  Will discuss with patient and family.   Subjective:   Pt reports that breathing is improved. Denies any further hemoptysis. Report pain and tenderness in L calf that started O/N.  Objective: Temp:  [97.4 F (36.3 C)-97.8 F (36.6 C)] 97.8 F (36.6 C) (07/25 0514) Pulse Rate:  [63-73] 63 (07/25 0514) Resp:  [16-18] 16 (07/25 0514) BP: (122-148)/(57-80) 148/77 mmHg (07/25 0514) SpO2:  [87 %-91 %] 90 % (07/25 0514) Weight:  [144 lb 1.6 oz (65.363 kg)-147 lb 7.8 oz (66.9 kg)] 144 lb 1.6 oz (65.363 kg) (07/25 0550) Physical Exam: Gen: NAD, alert, cooperative with exam, laying in bed  CV: RRR, good S1/S2, no murmur  Resp: No breath sounds heard in R base. Mild crackles in L base. Normal WOB.  Abd: SNTND, BS present, no guarding or organomegaly  Ext: 1+ pitting edema BL LE, changes of chronic venous stasis. L calf tenderness, no cords palpable  Neuro: Alert and oriented X 3, No gross deficits   Laboratory:  Recent  Labs Lab 09/02/13 2345 09/04/13 1150 09/05/13 0325  WBC 3.1* 4.1 3.4*  HGB 7.8* 8.9* 8.0*  HCT 25.5* 29.3* 26.0*  PLT 101* 112* 110*    Recent Labs Lab 09/02/13 1354  09/03/13 0940 09/04/13 1150 09/05/13 0325  NA 142  < > 144 144 144  K 4.1  < > 4.3 4.0 3.7  CL 100  < > 103 100 99  CO2 28  < > 29 30 31   BUN 58*  < > 56* 56* 61*  CREATININE 2.76*  < > 2.68* 2.61* 2.75*  CALCIUM 9.2  < > 8.8 9.0 9.1  PROT 7.5  --   --   --   --   BILITOT 0.6  --   --   --    --   ALKPHOS 107  --   --   --   --   ALT 14  --   --   --   --   AST 19  --   --   --   --   GLUCOSE 120*  < > 169* 89 101*  < > = values in this interval not displayed.  Imaging/Diagnostic Tests: ECG (7/23) : Afib with Ventricular pacing, PVCs, no ischemic changes noted  CT Chest (7/22): Cardiomegaly with a moderate-sized right pleural effusion.  Patchy interstitial and airspace densities in the right lung could represent a combination of asymmetric edema and atelectasis. Volume loss in the left lung.  Perihepatic ascites and concern for cirrhosis.  Mild mediastinal lymphadenopathy and possible supraclavicular  lymphadenopathy. These findings are nonspecific.  Stable 1.7 cm low-density structure in the liver probably represents  a benign etiology such as a cyst.   Lavon Paganini, MD 09/05/2013, 8:41 AM PGY-1, [place] Intern pager: [phone], text pages welcome

## 2013-09-05 NOTE — Progress Notes (Signed)
FMTS Attending Daily Note:  Lindsey Sabal MD  [phone] pager  Family Practice pager:  [phone] I have seen and examined this patient and have reviewed their chart. I have discussed this patient with the resident. I agree with the resident's findings, assessment and care plan.  Additionally:  - Weight down 3 lbs today.   - Still short of breath with moderate effort.  Not sure how much we can improve this. - Cardiology recommends keeping her 1 more day for IV diuresis.  Decreased dosage today - Plan will be switching her to PO diuretics tomorrow and home.  Continue metalozone.  Need to talk with family today to set realistic expectations for her course at home.    Alveda Reasons, MD 09/05/2013 12:43 PM

## 2013-09-06 ENCOUNTER — Telehealth: Payer: Self-pay | Admitting: Family Medicine

## 2013-09-06 DIAGNOSIS — I10 Essential (primary) hypertension: Secondary | ICD-10-CM

## 2013-09-06 LAB — CBC
HCT: 25.8 % — ABNORMAL LOW (ref 36.0–46.0)
Hemoglobin: 8 g/dL — ABNORMAL LOW (ref 12.0–15.0)
MCH: 28.2 pg (ref 26.0–34.0)
MCHC: 31 g/dL (ref 30.0–36.0)
MCV: 90.8 fL (ref 78.0–100.0)
Platelets: 113 10*3/uL — ABNORMAL LOW (ref 150–400)
RBC: 2.84 MIL/uL — ABNORMAL LOW (ref 3.87–5.11)
RDW: 17.2 % — ABNORMAL HIGH (ref 11.5–15.5)
WBC: 4.3 10*3/uL (ref 4.0–10.5)

## 2013-09-06 LAB — BASIC METABOLIC PANEL
Anion gap: 14 (ref 5–15)
BUN: 67 mg/dL — ABNORMAL HIGH (ref 6–23)
CO2: 34 mEq/L — ABNORMAL HIGH (ref 19–32)
Calcium: 9 mg/dL (ref 8.4–10.5)
Chloride: 95 mEq/L — ABNORMAL LOW (ref 96–112)
Creatinine, Ser: 2.74 mg/dL — ABNORMAL HIGH (ref 0.50–1.10)
GFR calc non Af Amer: 15 mL/min — ABNORMAL LOW (ref >90)
GFR, EST AFRICAN AMERICAN: 18 mL/min — AB (ref >90)
Glucose, Bld: 121 mg/dL — ABNORMAL HIGH (ref 70–99)
POTASSIUM: 3.6 meq/L — AB (ref 3.7–5.3)
SODIUM: 143 meq/L (ref 137–147)

## 2013-09-06 LAB — GLUCOSE, CAPILLARY
GLUCOSE-CAPILLARY: 112 mg/dL — AB (ref 70–99)
Glucose-Capillary: 148 mg/dL — ABNORMAL HIGH (ref 70–99)

## 2013-09-06 MED ORDER — TORSEMIDE 100 MG PO TABS: 100.0000 mg | ORAL_TABLET | Freq: Two times a day (BID) | ORAL | Status: DC

## 2013-09-06 MED ORDER — TORSEMIDE 20 MG PO TABS: 100.0000 mg | ORAL_TABLET | Freq: Two times a day (BID) | ORAL | Status: DC

## 2013-09-06 MED ORDER — TORSEMIDE 100 MG PO TABS
100.0000 mg | ORAL_TABLET | Freq: Two times a day (BID) | ORAL | Status: DC
  Administered 2013-09-06: 100 mg via ORAL
  Filled 2013-09-06 (×2): qty 1

## 2013-09-06 NOTE — Telephone Encounter (Signed)
Pt to be discharged on oral diuretics Sunday, 7/26. Unable to schedule follow up so will forward to admin to schedule this.

## 2013-09-06 NOTE — Plan of Care (Signed)
Problem: Phase I Progression Outcomes Goal: EF % per last Echo/documented,Core Reminder form on chart EF=55-60%     

## 2013-09-06 NOTE — Progress Notes (Signed)
Pt discharged hemodynamically stable !

## 2013-09-06 NOTE — Progress Notes (Signed)
Patient ID: [name], female   DOB: 01/28/1931, 80 y.o.   MRN: [id number]   SUBJECTIVE:  Breathing better today. Denies any complaints.  Scheduled Meds: . acetaminophen  650 mg Oral TID  . aspirin EC  81 mg Oral Daily  . carvedilol  12.5 mg Oral BID WC  . diltiazem  240 mg Oral Daily  . DULoxetine  30 mg Oral Daily  . heparin  5,000 Units Subcutaneous 3 times per day  . metolazone  5 mg Oral Daily  . pantoprazole  40 mg Oral Q0600  . potassium chloride  10 mEq Oral BID  . sodium chloride  3 mL Intravenous Q12H  . torsemide  100 mg Oral BID   Continuous Infusions:  PRN Meds:.sodium chloride, morphine, polyethylene glycol powder, sodium chloride    Filed Vitals:   09/05/13 0550 09/05/13 1041 09/05/13 2100 09/06/13 0500  BP:  140/70 145/72 142/69  Pulse:   54 80  Temp:   97.7 F (36.5 C) 98.6 F (37 C)  TempSrc:   Oral Oral  Resp:   16 16  Height:      Weight: 144 lb 1.6 oz (65.363 kg)   149 lb 9.6 oz (67.858 kg)  SpO2:   90% 94%    Intake/Output Summary (Last 24 hours) at 09/06/13 1013 Last data filed at 09/06/13 0200  Gross per 24 hour  Intake      0 ml  Output   1650 ml  Net  -1650 ml    LABS: Basic Metabolic Panel:  Recent Labs  09/05/13 0325 09/06/13 0310  NA 144 143  K 3.7 3.6*  CL 99 95*  CO2 31 34*  GLUCOSE 101* 121*  BUN 61* 67*  CREATININE 2.75* 2.74*  CALCIUM 9.1 9.0   Liver Function Tests: No results found for this basename: AST, ALT, ALKPHOS, BILITOT, PROT, ALBUMIN,  in the last 72 hours No results found for this basename: LIPASE, AMYLASE,  in the last 72 hours CBC:  Recent Labs  09/05/13 0325 09/06/13 0310  WBC 3.4* 4.3  HGB 8.0* 8.0*  HCT 26.0* 25.8*  MCV 91.5 90.8  PLT 110* 113*   Cardiac Enzymes: No results found for this basename: CKTOTAL, CKMB, CKMBINDEX, TROPONINI,  in the last 72 hours BNP: No components found with this basename: POCBNP,  D-Dimer: No results found for this basename: DDIMER,  in the last 72  hours Hemoglobin A1C: No results found for this basename: HGBA1C,  in the last 72 hours Fasting Lipid Panel: No results found for this basename: CHOL, HDL, LDLCALC, TRIG, CHOLHDL, LDLDIRECT,  in the last 72 hours Thyroid Function Tests: No results found for this basename: TSH, T4TOTAL, FREET3, T3FREE, THYROIDAB,  in the last 72 hours Anemia Panel: No results found for this basename: VITAMINB12, FOLATE, FERRITIN, TIBC, IRON, RETICCTPCT,  in the last 72 hours  RADIOLOGY: Ct Chest Wo Contrast  09/02/2013   CLINICAL DATA:  Hemoptysis and shortness of breath.  EXAM: CT CHEST WITHOUT CONTRAST  TECHNIQUE: Multidetector CT imaging of the chest was performed following the standard protocol without IV contrast.  COMPARISON:  02/27/2012 and abdominal CT 10/05/2009  FINDINGS: There is a moderate amount of right pleural fluid. No significant pericardial fluid. The heart is markedly enlarged, particularly the left atrium and right atrium. Patient has a right dual lead cardiac pacemaker with leads in the right atrium and right ventricle. There are mitral annular calcifications. Calcifications in the coronary arteries. Small amount of fluid in the  superior pericardial recess. There may be prominent lymph nodes in the supraclavicular areas, particularly on the right side. No significant axillary lymphadenopathy. Prominent precarinal lymph node on sequence 2, image 15 measures 1.6 cm. Subcarinal tissue measures 1.4 cm in the short axis on sequence 2, image 23. Limited evaluation for hilar lymphadenopathy on this non-contrast examination. No significant left pleural fluid.  There is a small amount of perihepatic ascites. The liver contour is mildly nodular which raises concern for cirrhosis. There is indeterminate 1.7 cm low-density structure in the right hepatic lobe on sequence 2, image 53. This structure has not significantly changed since 2011 and probably represents a benign etiology such as a cyst. Again noted is a  large calcification the upper abdomen which could represent a calcified node or vascular calcification. Questionable cyst along the right kidney upper pole. No gross abnormality to the adrenal tissue.  The trachea and mainstem bronchi are patent. There is interstitial thickening, particularly in the right upper lobe and superior segment of the right lower lobe. This could represent mild edema. There are areas of air trapping in the right upper lobe. Small punctate calcifications in the right upper lobe. Volume loss in the right lower lobe related to pleural fluid. Volume loss throughout the left lower lobe. No acute bone abnormality.  IMPRESSION: Cardiomegaly with a moderate-sized right pleural effusion.  Patchy interstitial and airspace densities in the right lung could represent a combination of asymmetric edema and atelectasis. Volume loss in the left lung.  Perihepatic ascites and concern for cirrhosis.  Mild mediastinal lymphadenopathy and possible supraclavicular lymphadenopathy. These findings are nonspecific.  Stable 1.7 cm low-density structure in the liver probably represents a benign etiology such as a cyst.   Electronically Signed   By: Markus Daft M.D.   On: 09/02/2013 13:57    PHYSICAL EXAM General: NAD  Neck: JVP 12 cm with prominent EJ, no thyromegaly or thyroid nodule.  Lungs: Diminished BS in bases with mild crackles CV: Nondisplaced PMI. Heart irregular S1/S2, no S3/S4, 1/6 SEM RUSB. No ankle edema. No carotid bruit.  Abdomen: Soft, nontender, no hepatosplenomegaly, no distention.  Skin: Petechial hemorrhages around the ankles.  Neurologic: Alert and oriented x 3.  Psych: Normal affect.  Extremities: No clubbing or cyanosis.   TELEMETRY: Reviewed telemetry pt in atrial fibrillation with controlled rate, occ V pacing  ASSESSMENT AND PLAN: 80 yo with history of hypertrophic cardiomyopathy with chronic diastolic CHF, chronic respiratory failure on home oxygen, chronic atrial  fibrillation (not on warfarin due to GI bleeding and falls), [employer] PPM, and CKD with baseline poor functional status was admitted with increased dyspnea.  1. Hypertrophic cardiomyopathy: She has not had significant outflow tract obstruction on her recent echoes, but she does have diastolic CHF. Continue beta blocker and calcium channel blocker as these may be limiting outflow tract obstruction.  2. Acute on chronic diastolic CHF: Volume actually looks better with less edema. Weight down to 144 yesterday. listed as 149 today but patient states she hasn't been weighed. Need to weigh standing. I/O negative 2.6 liters since admit.  She is subjectively breathing better. Will transition to oral diuretics today. DC IV lasix. Start Demadex 100 mg bid.  - Follow BMET - Weigh standing up please.  3. CKD stage IV: BMET is stable.  4. Atrial fibrillation: Chronic. Rate controlled with Coreg and diltiazem CD. She is not on anticoagulation due to frequent falls in the past and history of GI bleeding.  5. Patient has overall  poor prognosis and has poor function at baseline. Would avoid invasive procedures. She is appropriately DNR.   Collier Salina [facility] 09/06/2013

## 2013-09-06 NOTE — Discharge Instructions (Signed)
You were admitted because you were having difficulty breathing.  We got a lot of fluid off your lungs and rest of your body with Lasix and Metolazone while you were admitted.  Your breathing has improved. - Start taking Metolazone 5mg  daily - Resume taking Torsemide (demadex) but with INCREASED doses: 100mg  by mouth twice per day.  Your blood sugars were running low while you were in the hospital.  We stopped your Lantus insulin.  - Stop taking Lantus insulin at home.   Heart Failure Heart failure is a condition in which the heart has trouble pumping blood. This means your heart does not pump blood efficiently for your body to work well. In some cases of heart failure, fluid may back up into your lungs or you may have swelling (edema) in your lower legs. Heart failure is usually a long-term (chronic) condition. It is important for you to take good care of yourself and follow your health care provider's treatment plan. CAUSES  Some health conditions can cause heart failure. Those health conditions include:  High blood pressure (hypertension). Hypertension causes the heart muscle to work harder than normal. When pressure in the blood vessels is high, the heart needs to pump (contract) with more force in order to circulate blood throughout the body. High blood pressure eventually causes the heart to become stiff and weak.  Coronary artery disease (CAD). CAD is the buildup of cholesterol and fat (plaque) in the arteries of the heart. The blockage in the arteries deprives the heart muscle of oxygen and blood. This can cause chest pain and may lead to a heart attack. High blood pressure can also contribute to CAD.  Heart attack (myocardial infarction). A heart attack occurs when one or more arteries in the heart become blocked. The loss of oxygen damages the muscle tissue of the heart. When this happens, part of the heart muscle dies. The injured tissue does not contract as well and weakens the heart's  ability to pump blood.  Abnormal heart valves. When the heart valves do not open and close properly, it can cause heart failure. This makes the heart muscle pump harder to keep the blood flowing.  Heart muscle disease (cardiomyopathy or myocarditis). Heart muscle disease is damage to the heart muscle from a variety of causes. These can include drug or alcohol abuse, infections, or unknown reasons. These can increase the risk of heart failure.  Lung disease. Lung disease makes the heart work harder because the lungs do not work properly. This can cause a strain on the heart, leading it to fail.  Diabetes. Diabetes increases the risk of heart failure. High blood sugar contributes to high fat (lipid) levels in the blood. Diabetes can also cause slow damage to tiny blood vessels that carry important nutrients to the heart muscle. When the heart does not get enough oxygen and food, it can cause the heart to become weak and stiff. This leads to a heart that does not contract efficiently.  Other conditions can contribute to heart failure. These include abnormal heart rhythms, thyroid problems, and low blood counts (anemia). Certain unhealthy behaviors can increase the risk of heart failure, including:  Being overweight.  Smoking or chewing tobacco.  Eating foods high in fat and cholesterol.  Abusing illicit drugs or alcohol.  Lacking physical activity. SYMPTOMS  Heart failure symptoms may vary and can be hard to detect. Symptoms may include:  Shortness of breath with activity, such as climbing stairs.  Persistent cough.  Swelling of the  feet, ankles, legs, or abdomen.  Unexplained weight gain.  Difficulty breathing when lying flat (orthopnea).  Waking from sleep because of the need to sit up and get more air.  Rapid heartbeat.  Fatigue and loss of energy.  Feeling light-headed, dizzy, or close to fainting.  Loss of appetite.  Nausea.  Increased urination during the night  (nocturia). DIAGNOSIS  A diagnosis of heart failure is based on your history, symptoms, physical examination, and diagnostic tests. Diagnostic tests for heart failure may include:  Echocardiography.  Electrocardiography.  Chest X-ray.  Blood tests.  Exercise stress test.  Cardiac angiography.  Radionuclide scans. TREATMENT  Treatment is aimed at managing the symptoms of heart failure. Medicines, behavioral changes, or surgical intervention may be necessary to treat heart failure.  Medicines to help treat heart failure may include:  Angiotensin-converting enzyme (ACE) inhibitors. This type of medicine blocks the effects of a blood protein called angiotensin-converting enzyme. ACE inhibitors relax (dilate) the blood vessels and help lower blood pressure.  Angiotensin receptor blockers (ARBs). This type of medicine blocks the actions of a blood protein called angiotensin. Angiotensin receptor blockers dilate the blood vessels and help lower blood pressure.  Water pills (diuretics). Diuretics cause the kidneys to remove salt and water from the blood. The extra fluid is removed through urination. This loss of extra fluid lowers the volume of blood the heart pumps.  Beta blockers. These prevent the heart from beating too fast and improve heart muscle strength.  Digitalis. This increases the force of the heartbeat.  Healthy behavior changes include:  Obtaining and maintaining a healthy weight.  Stopping smoking or chewing tobacco.  Eating heart-healthy foods.  Limiting or avoiding alcohol.  Stopping illicit drug use.  Physical activity as directed by your health care provider.  Surgical treatment for heart failure may include:  A procedure to open blocked arteries, repair damaged heart valves, or remove damaged heart muscle tissue.  A pacemaker to improve heart muscle function and control certain abnormal heart rhythms.  An internal cardioverter defibrillator to treat  certain serious abnormal heart rhythms.  A left ventricular assist device (LVAD) to assist the pumping ability of the heart. HOME CARE INSTRUCTIONS   Take medicines only as directed by your health care provider. Medicines are important in reducing the workload of your heart, slowing the progression of heart failure, and improving your symptoms.  Do not stop taking your medicine unless directed by your health care provider.  Do not skip any dose of medicine.  Refill your prescriptions before you run out of medicine. Your medicines are needed every day.  Engage in moderate physical activity if directed by your health care provider. Moderate physical activity can benefit some people. The elderly and people with severe heart failure should consult with a health care provider for physical activity recommendations.  Eat heart-healthy foods. Food choices should be free of trans fat and low in saturated fat, cholesterol, and salt (sodium). Healthy choices include fresh or frozen fruits and vegetables, fish, lean meats, legumes, fat-free or low-fat dairy products, and whole grain or high fiber foods. Talk to a dietitian to learn more about heart-healthy foods.  Limit sodium if directed by your health care provider. Sodium restriction may reduce symptoms of heart failure in some people. Talk to a dietitian to learn more about heart-healthy seasonings.  Use healthy cooking methods. Healthy cooking methods include roasting, grilling, broiling, baking, poaching, steaming, or stir-frying. Talk to a dietitian to learn more about healthy cooking methods.  Limit fluids if directed by your health care provider. Fluid restriction may reduce symptoms of heart failure in some people.  Weigh yourself every day. Daily weights are important in the early recognition of excess fluid. You should weigh yourself every morning after you urinate and before you eat breakfast. Wear the same amount of clothing each time you  weigh yourself. Record your daily weight. Provide your health care provider with your weight record.  Monitor and record your blood pressure if directed by your health care provider.  Check your pulse if directed by your health care provider.  Lose weight if directed by your health care provider. Weight loss may reduce symptoms of heart failure in some people.  Stop smoking or chewing tobacco. Nicotine makes your heart work harder by causing your blood vessels to constrict. Do not use nicotine gum or patches before talking to your health care provider.  Keep all follow-up visits as directed by your health care provider. This is important.  Limit alcohol intake to no more than 1 drink per day for nonpregnant women and 2 drinks per day for men. One drink equals 12 ounces of beer, 5 ounces of wine, or 1 ounces of hard liquor. Drinking more than that is harmful to your heart. Tell your health care provider if you drink alcohol several times a week. Talk with your health care provider about whether alcohol is safe for you. If your heart has already been damaged by alcohol or you have severe heart failure, drinking alcohol should be stopped completely.  Stop illicit drug use.  Stay up-to-date with immunizations. It is especially important to prevent respiratory infections through current pneumococcal and influenza immunizations.  Manage other health conditions such as hypertension, diabetes, thyroid disease, or abnormal heart rhythms as directed by your health care provider.  Learn to manage stress.  Plan rest periods when fatigued.  Learn strategies to manage high temperatures. If the weather is extremely hot:  Avoid vigorous physical activity.  Use air conditioning or fans or seek a cooler location.  Avoid caffeine and alcohol.  Wear loose-fitting, lightweight, and light-colored clothing.  Learn strategies to manage cold temperatures. If the weather is extremely cold:  Avoid vigorous  physical activity.  Layer clothes.  Wear mittens or gloves, a hat, and a scarf when going outside.  Avoid alcohol.  Obtain ongoing education and support as needed.  Participate in or seek rehabilitation as needed to maintain or improve independence and quality of life. SEEK MEDICAL CARE IF:   Your weight increases by 03 lb/1.4 kg in 1 day or 05 lb/2.3 kg in a week.  You have increasing shortness of breath that is unusual for you.  You are unable to participate in your usual physical activities.  You tire easily.  You cough more than normal, especially with physical activity.  You have any or more swelling in areas such as your hands, feet, ankles, or abdomen.  You are unable to sleep because it is hard to breathe.  You feel like your heart is beating fast (palpitations).  You become dizzy or light-headed upon standing up. SEEK IMMEDIATE MEDICAL CARE IF:   You have difficulty breathing.  There is a change in mental status such as decreased alertness or difficulty with concentration.  You have a pain or discomfort in your chest.  You have an episode of fainting (syncope). MAKE SURE YOU:   Understand these instructions.  Will watch your condition.  Will get help right away if you are  not doing well or get worse. Document Released: 01/29/2005 Document Revised: 06/15/2013 Document Reviewed: 02/29/2012 [facility] Patient Information 2015 [place], [place]. This information is not intended to replace advice given to you by your health care provider. Make sure you discuss any questions you have with your health care provider.

## 2013-09-07 NOTE — Telephone Encounter (Signed)
Appointment made for Wednesday 7/29 @ 1:45pm with Dr. Andria Frames

## 2013-09-09 ENCOUNTER — Encounter: Payer: Self-pay | Admitting: Family Medicine

## 2013-09-09 ENCOUNTER — Ambulatory Visit (INDEPENDENT_AMBULATORY_CARE_PROVIDER_SITE_OTHER): Payer: PRIVATE HEALTH INSURANCE | Admitting: Family Medicine

## 2013-09-09 VITALS — BP 100/56 | HR 74 | Ht 65.0 in | Wt 133.1 lb

## 2013-09-09 DIAGNOSIS — I509 Heart failure, unspecified: Secondary | ICD-10-CM

## 2013-09-09 DIAGNOSIS — I5032 Chronic diastolic (congestive) heart failure: Secondary | ICD-10-CM

## 2013-09-09 DIAGNOSIS — M6281 Muscle weakness (generalized): Secondary | ICD-10-CM

## 2013-09-09 DIAGNOSIS — D6489 Other specified anemias: Secondary | ICD-10-CM

## 2013-09-09 DIAGNOSIS — N184 Chronic kidney disease, stage 4 (severe): Secondary | ICD-10-CM

## 2013-09-09 NOTE — Patient Instructions (Signed)
Yes, restart the insulin at 10 units each day Weigh every day.  Let me know if your weight gets to 138 or above. See me in three weeks for some more blood work. Get more active.  That is the only way you will get more energy and be less sleepy. Double check your medication list at home and make sure it matches my list.

## 2013-09-09 NOTE — Discharge Summary (Signed)
Family Medicine Teaching Service  Discharge Note : Attending Jeff Adeola Dennen MD Pager [phone] Inpatient Team Pager:  [phone]  I have reviewed this patient and the patient's chart and have discussed discharge planning with the resident at the time of discharge. I agree with the discharge plan as above.    

## 2013-09-10 DIAGNOSIS — M6281 Muscle weakness (generalized): Secondary | ICD-10-CM | POA: Insufficient documentation

## 2013-09-10 NOTE — Assessment & Plan Note (Signed)
Stable in hosp despite diuresis.

## 2013-09-10 NOTE — Assessment & Plan Note (Signed)
Stable in hospital

## 2013-09-10 NOTE — Progress Notes (Signed)
   Subjective:    Patient ID: [name], female    DOB: 1930-03-26, 80 y.o.   MRN: [id number]  HPI FU hospitalization for end stage CHF (Rt heart + diastolic dysfunction).  Still very weak.  Mildly less SOB.  Impressive 16 lb diuresis.  Reviewed labs and hosp DC summary.  Creat is stable.  Coreg decreased so I need to check rate control.    Did not bring meds in  Insulin was held at DC due to hypoglycemia in hosp.  Patient restarted because home BS was >500.  No hypoglycemic symptoms since DC     Review of Systems     Objective:   Physical Exam Lungs clear Cardiac Rate controled with almost normalization of rhythm.  Subtle irreg irreg. No peripheral edeam        Assessment & Plan:

## 2013-09-10 NOTE — Assessment & Plan Note (Signed)
At dry wt.

## 2013-09-10 NOTE — Assessment & Plan Note (Signed)
Multi factorial.  CHF and CKD.  Also due to inactivity.  Long discussion. Now that at dry weight, will push herself for improved activity.

## 2013-09-28 ENCOUNTER — Telehealth: Payer: Self-pay | Admitting: Family Medicine

## 2013-09-28 NOTE — Telephone Encounter (Signed)
Pt calls wanting to make an appt with Dr. Andria Frames since being d/c from hospital 2 weeks ago.  Advised patient that Dr. Andria Frames had nothing until 1st week in Sept. Pt would like to know if she can wait this long? She states that Dr. Andria Frames was to put her on new meds. Pls advise.

## 2013-09-28 NOTE — Telephone Encounter (Signed)
OK to double book me for either 8/19 or 8/26.

## 2013-09-29 NOTE — Telephone Encounter (Signed)
Patient booked 8/19 @ 1:30pm

## 2013-09-30 ENCOUNTER — Ambulatory Visit (INDEPENDENT_AMBULATORY_CARE_PROVIDER_SITE_OTHER): Payer: PRIVATE HEALTH INSURANCE | Admitting: Family Medicine

## 2013-09-30 VITALS — BP 115/60 | HR 53 | Temp 98.2°F | Ht 65.0 in | Wt 127.0 lb

## 2013-09-30 DIAGNOSIS — I482 Chronic atrial fibrillation, unspecified: Secondary | ICD-10-CM

## 2013-09-30 DIAGNOSIS — E876 Hypokalemia: Secondary | ICD-10-CM

## 2013-09-30 DIAGNOSIS — I4891 Unspecified atrial fibrillation: Secondary | ICD-10-CM

## 2013-09-30 DIAGNOSIS — N184 Chronic kidney disease, stage 4 (severe): Secondary | ICD-10-CM

## 2013-09-30 DIAGNOSIS — I5032 Chronic diastolic (congestive) heart failure: Secondary | ICD-10-CM

## 2013-09-30 DIAGNOSIS — I50810 Right heart failure, unspecified: Secondary | ICD-10-CM

## 2013-09-30 DIAGNOSIS — D6489 Other specified anemias: Secondary | ICD-10-CM

## 2013-09-30 DIAGNOSIS — I509 Heart failure, unspecified: Secondary | ICD-10-CM

## 2013-09-30 DIAGNOSIS — I10 Essential (primary) hypertension: Secondary | ICD-10-CM

## 2013-09-30 LAB — BASIC METABOLIC PANEL
BUN: 145 mg/dL — ABNORMAL HIGH (ref 6–23)
CO2: 35 mEq/L — ABNORMAL HIGH (ref 19–32)
CREATININE: 3.76 mg/dL — AB (ref 0.50–1.10)
Calcium: 10.1 mg/dL (ref 8.4–10.5)
Chloride: 80 mEq/L — ABNORMAL LOW (ref 96–112)
Glucose, Bld: 121 mg/dL — ABNORMAL HIGH (ref 70–99)
Potassium: 2.6 mEq/L — CL (ref 3.5–5.3)
Sodium: 135 mEq/L (ref 135–145)

## 2013-09-30 LAB — CBC
HCT: 31.4 % — ABNORMAL LOW (ref 36.0–46.0)
HEMOGLOBIN: 10.7 g/dL — AB (ref 12.0–15.0)
MCH: 28.5 pg (ref 26.0–34.0)
MCHC: 34.1 g/dL (ref 30.0–36.0)
MCV: 83.5 fL (ref 78.0–100.0)
Platelets: 177 10*3/uL (ref 150–400)
RBC: 3.76 MIL/uL — AB (ref 3.87–5.11)
RDW: 16.3 % — ABNORMAL HIGH (ref 11.5–15.5)
WBC: 9.7 10*3/uL (ref 4.0–10.5)

## 2013-09-30 MED ORDER — METOLAZONE 5 MG PO TABS: 5.0000 mg | ORAL_TABLET | Freq: Every day | ORAL | Status: DC

## 2013-09-30 MED ORDER — POTASSIUM CHLORIDE ER 10 MEQ PO TBCR: 10.0000 meq | EXTENDED_RELEASE_TABLET | Freq: Two times a day (BID) | ORAL | Status: DC

## 2013-09-30 NOTE — Patient Instructions (Addendum)
You are supposed to take carvedilol 12.5 mg twice a day.  You can save money by using up your carvedilol 25 mg and take a half tab twice a day. You are supposed to take torsemide 100mg  twice a day.  You can save money by using up your torsemide 20 mg by taking 5 pills twice a day. You look good.  I do not want to change anything just by looking at you. I will check some blood work and call you with the results.  I may need to make some changes based on the blood work. I sent in two refills.  One for the potassium and one for the metolazone.

## 2013-09-30 NOTE — Assessment & Plan Note (Signed)
Good control

## 2013-10-01 ENCOUNTER — Encounter: Payer: Self-pay | Admitting: Family Medicine

## 2013-10-01 ENCOUNTER — Telehealth: Payer: Self-pay | Admitting: *Deleted

## 2013-10-01 ENCOUNTER — Other Ambulatory Visit: Payer: Self-pay | Admitting: Family Medicine

## 2013-10-01 DIAGNOSIS — I50810 Right heart failure, unspecified: Secondary | ICD-10-CM | POA: Insufficient documentation

## 2013-10-01 DIAGNOSIS — E876 Hypokalemia: Secondary | ICD-10-CM | POA: Insufficient documentation

## 2013-10-01 NOTE — Telephone Encounter (Signed)
Received call from [facility] with a critical value of 2.6 K.  Reported to Dr. Nori Riis, as Dr. Andria Frames was not in clinic. Fleeger, [name]

## 2013-10-01 NOTE — Progress Notes (Signed)
[name] critical lab value potassium 2.6 verified. Looks like Dr Andria Frames refilled her potassium yesterday---I would recommend doubling dose next 4 days and have a repeat lab on Monday--her dose now is 10 meq bid--increase to 20 meq bid. I will put in for lab only for Monday. Have her return to origial dose(10 meq bid ) on Monday AM. THANKS! Dorcas Mcmurray

## 2013-10-01 NOTE — Assessment & Plan Note (Signed)
Rate controled

## 2013-10-01 NOTE — Progress Notes (Signed)
Yes I did mean Potassium.  Thanks Dr. Andria Frames. Rema Lievanos, Salome Spotted

## 2013-10-01 NOTE — Progress Notes (Signed)
   Subjective:    Patient ID: [name], female    DOB: 07-26-1930, 80 y.o.   MRN: [id number]  HPI Follow up of severe CHF (right sided and diastolic) plus CKD stage 4.  States she continues to do well at home.  Less dyspnea.  Still largely a bed to chair existence.  No falls.  Not light headed.  Feels very similar to her symptoms at hospital follow up one month ago.    Review of Systems     Objective:   Physical Exam Wt loss noted.  (Lean body wt loss versus continued diuresis?) VS noted.  Better pulse ox after walking back to exam room Good affect. Lungs clear Cardiac irreg but rate controled. 2/6 SEM. Ext trace edema.        Assessment & Plan:

## 2013-10-01 NOTE — Progress Notes (Signed)
Spoke with pt and her daughter and reported lab value.  Daughter states that they have not picked up the Vit D yet, advised to go pick up as soon as possible and to take as directed below.  Advised if pt starts to have any chest pain, worsening SOB or anything "doe not feel right" to go to the ED immediately.  Pt and dgt agreeable and with no additional questions.  Fleeger, Salome Spotted

## 2013-10-01 NOTE — Assessment & Plan Note (Signed)
Severe but stable, check bmp.

## 2013-10-01 NOTE — Assessment & Plan Note (Signed)
Low K treated.  Need to carefully follow since hyperkalemia can occur in CKD.

## 2013-10-01 NOTE — Progress Notes (Signed)
Called and verified that they got the message.  Of course, [name] meant potassium in her documentation below, not vit D.  Potassium is what she told the family.

## 2013-10-01 NOTE — Assessment & Plan Note (Addendum)
Asymptomatic.  Check BMP.  Bump in creat noted.  Will continue diuretics and monitor.  She does not appear dry by exam. Seems euvolemic to mildly volume overloaded.

## 2013-10-03 ENCOUNTER — Emergency Department (HOSPITAL_COMMUNITY): Payer: PRIVATE HEALTH INSURANCE

## 2013-10-03 ENCOUNTER — Inpatient Hospital Stay (HOSPITAL_COMMUNITY)
Admission: EM | Admit: 2013-10-03 | Discharge: 2013-10-05 | DRG: 918 | Disposition: A | Discharge status: Home / Self Care | Payer: PRIVATE HEALTH INSURANCE | Attending: Family Medicine | Admitting: Family Medicine

## 2013-10-03 DIAGNOSIS — Z823 Family history of stroke: Secondary | ICD-10-CM | POA: Diagnosis not present

## 2013-10-03 DIAGNOSIS — T502X1A Poisoning by carbonic-anhydrase inhibitors, benzothiadiazides and other diuretics, accidental (unintentional), initial encounter: Principal | ICD-10-CM | POA: Diagnosis present

## 2013-10-03 DIAGNOSIS — IMO0002 Reserved for concepts with insufficient information to code with codable children: Secondary | ICD-10-CM | POA: Diagnosis present

## 2013-10-03 DIAGNOSIS — D631 Anemia in chronic kidney disease: Secondary | ICD-10-CM | POA: Diagnosis present

## 2013-10-03 DIAGNOSIS — Z885 Allergy status to narcotic agent status: Secondary | ICD-10-CM

## 2013-10-03 DIAGNOSIS — R109 Unspecified abdominal pain: Secondary | ICD-10-CM | POA: Diagnosis present

## 2013-10-03 DIAGNOSIS — I129 Hypertensive chronic kidney disease with stage 1 through stage 4 chronic kidney disease, or unspecified chronic kidney disease: Secondary | ICD-10-CM | POA: Diagnosis present

## 2013-10-03 DIAGNOSIS — M1710 Unilateral primary osteoarthritis, unspecified knee: Secondary | ICD-10-CM | POA: Diagnosis present

## 2013-10-03 DIAGNOSIS — Z833 Family history of diabetes mellitus: Secondary | ICD-10-CM | POA: Diagnosis not present

## 2013-10-03 DIAGNOSIS — I10 Essential (primary) hypertension: Secondary | ICD-10-CM | POA: Diagnosis present

## 2013-10-03 DIAGNOSIS — E872 Acidosis, unspecified: Secondary | ICD-10-CM | POA: Diagnosis present

## 2013-10-03 DIAGNOSIS — M4120 Other idiopathic scoliosis, site unspecified: Secondary | ICD-10-CM | POA: Diagnosis present

## 2013-10-03 DIAGNOSIS — Z888 Allergy status to other drugs, medicaments and biological substances status: Secondary | ICD-10-CM

## 2013-10-03 DIAGNOSIS — E785 Hyperlipidemia, unspecified: Secondary | ICD-10-CM | POA: Diagnosis present

## 2013-10-03 DIAGNOSIS — F3289 Other specified depressive episodes: Secondary | ICD-10-CM | POA: Diagnosis present

## 2013-10-03 DIAGNOSIS — F329 Major depressive disorder, single episode, unspecified: Secondary | ICD-10-CM | POA: Diagnosis present

## 2013-10-03 DIAGNOSIS — I959 Hypotension, unspecified: Secondary | ICD-10-CM | POA: Diagnosis present

## 2013-10-03 DIAGNOSIS — Z950 Presence of cardiac pacemaker: Secondary | ICD-10-CM | POA: Diagnosis not present

## 2013-10-03 DIAGNOSIS — E1142 Type 2 diabetes mellitus with diabetic polyneuropathy: Secondary | ICD-10-CM | POA: Diagnosis present

## 2013-10-03 DIAGNOSIS — I428 Other cardiomyopathies: Secondary | ICD-10-CM | POA: Diagnosis present

## 2013-10-03 DIAGNOSIS — K219 Gastro-esophageal reflux disease without esophagitis: Secondary | ICD-10-CM | POA: Diagnosis present

## 2013-10-03 DIAGNOSIS — Z800 Family history of malignant neoplasm of digestive organs: Secondary | ICD-10-CM | POA: Diagnosis not present

## 2013-10-03 DIAGNOSIS — I482 Chronic atrial fibrillation, unspecified: Secondary | ICD-10-CM

## 2013-10-03 DIAGNOSIS — Z9981 Dependence on supplemental oxygen: Secondary | ICD-10-CM | POA: Diagnosis not present

## 2013-10-03 DIAGNOSIS — Z66 Do not resuscitate: Secondary | ICD-10-CM | POA: Diagnosis present

## 2013-10-03 DIAGNOSIS — R5381 Other malaise: Secondary | ICD-10-CM | POA: Diagnosis not present

## 2013-10-03 DIAGNOSIS — E1149 Type 2 diabetes mellitus with other diabetic neurological complication: Secondary | ICD-10-CM | POA: Diagnosis present

## 2013-10-03 DIAGNOSIS — Z8249 Family history of ischemic heart disease and other diseases of the circulatory system: Secondary | ICD-10-CM

## 2013-10-03 DIAGNOSIS — E876 Hypokalemia: Secondary | ICD-10-CM | POA: Diagnosis present

## 2013-10-03 DIAGNOSIS — R579 Shock, unspecified: Secondary | ICD-10-CM

## 2013-10-03 DIAGNOSIS — I5032 Chronic diastolic (congestive) heart failure: Secondary | ICD-10-CM | POA: Diagnosis present

## 2013-10-03 DIAGNOSIS — R5383 Other fatigue: Secondary | ICD-10-CM | POA: Diagnosis present

## 2013-10-03 DIAGNOSIS — N179 Acute kidney failure, unspecified: Secondary | ICD-10-CM | POA: Diagnosis present

## 2013-10-03 DIAGNOSIS — N184 Chronic kidney disease, stage 4 (severe): Secondary | ICD-10-CM | POA: Diagnosis present

## 2013-10-03 DIAGNOSIS — T503X1A Poisoning by electrolytic, caloric and water-balance agents, accidental (unintentional), initial encounter: Secondary | ICD-10-CM | POA: Diagnosis present

## 2013-10-03 DIAGNOSIS — R410 Disorientation, unspecified: Secondary | ICD-10-CM | POA: Diagnosis present

## 2013-10-03 DIAGNOSIS — T504X1A Poisoning by drugs affecting uric acid metabolism, accidental (unintentional), initial encounter: Secondary | ICD-10-CM | POA: Diagnosis present

## 2013-10-03 DIAGNOSIS — I6789 Other cerebrovascular disease: Secondary | ICD-10-CM

## 2013-10-03 DIAGNOSIS — R7989 Other specified abnormal findings of blood chemistry: Secondary | ICD-10-CM

## 2013-10-03 DIAGNOSIS — I4891 Unspecified atrial fibrillation: Secondary | ICD-10-CM | POA: Diagnosis present

## 2013-10-03 DIAGNOSIS — I2510 Atherosclerotic heart disease of native coronary artery without angina pectoris: Secondary | ICD-10-CM | POA: Diagnosis present

## 2013-10-03 DIAGNOSIS — I5030 Unspecified diastolic (congestive) heart failure: Secondary | ICD-10-CM | POA: Diagnosis present

## 2013-10-03 DIAGNOSIS — E118 Type 2 diabetes mellitus with unspecified complications: Secondary | ICD-10-CM | POA: Diagnosis present

## 2013-10-03 DIAGNOSIS — N183 Chronic kidney disease, stage 3 (moderate): Secondary | ICD-10-CM

## 2013-10-03 DIAGNOSIS — D649 Anemia, unspecified: Secondary | ICD-10-CM

## 2013-10-03 DIAGNOSIS — N039 Chronic nephritic syndrome with unspecified morphologic changes: Secondary | ICD-10-CM

## 2013-10-03 DIAGNOSIS — I509 Heart failure, unspecified: Secondary | ICD-10-CM | POA: Diagnosis present

## 2013-10-03 LAB — POC OCCULT BLOOD, ED: Fecal Occult Bld: NEGATIVE

## 2013-10-03 LAB — GLUCOSE, CAPILLARY
GLUCOSE-CAPILLARY: 136 mg/dL — AB (ref 70–99)
Glucose-Capillary: 114 mg/dL — ABNORMAL HIGH (ref 70–99)
Glucose-Capillary: 97 mg/dL (ref 70–99)
Glucose-Capillary: 106 mg/dL — ABNORMAL HIGH (ref 70–99)

## 2013-10-03 LAB — URINALYSIS, ROUTINE W REFLEX MICROSCOPIC
Bilirubin Urine: NEGATIVE
Glucose, UA: NEGATIVE mg/dL
Ketones, ur: NEGATIVE mg/dL
Nitrite: POSITIVE — AB
PROTEIN: NEGATIVE mg/dL
Specific Gravity, Urine: 1.01 (ref 1.005–1.030)
UROBILINOGEN UA: 0.2 mg/dL (ref 0.0–1.0)
pH: 6.5 (ref 5.0–8.0)

## 2013-10-03 LAB — CBC
HEMATOCRIT: 26.8 % — AB (ref 36.0–46.0)
HEMOGLOBIN: 9.1 g/dL — AB (ref 12.0–15.0)
MCH: 29 pg (ref 26.0–34.0)
MCHC: 34 g/dL (ref 30.0–36.0)
MCV: 85.4 fL (ref 78.0–100.0)
Platelets: 132 10*3/uL — ABNORMAL LOW (ref 150–400)
RBC: 3.14 MIL/uL — ABNORMAL LOW (ref 3.87–5.11)
RDW: 14.8 % (ref 11.5–15.5)
WBC: 5.9 10*3/uL (ref 4.0–10.5)

## 2013-10-03 LAB — TROPONIN I
Troponin I: <0.3 ng/mL (ref <0.30)
Troponin I: <0.3 ng/mL (ref <0.30)
Troponin I: <0.3 ng/mL (ref <0.30)

## 2013-10-03 LAB — BASIC METABOLIC PANEL
ANION GAP: 20 — AB (ref 5–15)
ANION GAP: 23 — AB (ref 5–15)
BUN: 157 mg/dL — ABNORMAL HIGH (ref 6–23)
BUN: 147 mg/dL — AB (ref 6–23)
CALCIUM: 10 mg/dL (ref 8.4–10.5)
CHLORIDE: 81 meq/L — AB (ref 96–112)
CO2: 32 meq/L (ref 19–32)
CO2: 33 mEq/L — ABNORMAL HIGH (ref 19–32)
CREATININE: 3.42 mg/dL — AB (ref 0.50–1.10)
CREATININE: 3.95 mg/dL — AB (ref 0.50–1.10)
Calcium: 9.7 mg/dL (ref 8.4–10.5)
Chloride: 86 mEq/L — ABNORMAL LOW (ref 96–112)
GFR calc Af Amer: 11 mL/min — ABNORMAL LOW (ref >90)
GFR calc non Af Amer: 10 mL/min — ABNORMAL LOW (ref >90)
GFR, EST AFRICAN AMERICAN: 13 mL/min — AB (ref >90)
GFR, EST NON AFRICAN AMERICAN: 12 mL/min — AB (ref >90)
Glucose, Bld: 244 mg/dL — ABNORMAL HIGH (ref 70–99)
Glucose, Bld: 96 mg/dL (ref 70–99)
Potassium: 3 mEq/L — ABNORMAL LOW (ref 3.7–5.3)
Potassium: 3.1 mEq/L — ABNORMAL LOW (ref 3.7–5.3)
Sodium: 136 mEq/L — ABNORMAL LOW (ref 137–147)
Sodium: 139 mEq/L (ref 137–147)

## 2013-10-03 LAB — CBG MONITORING, ED: GLUCOSE-CAPILLARY: 169 mg/dL — AB (ref 70–99)

## 2013-10-03 LAB — URINE MICROSCOPIC-ADD ON

## 2013-10-03 LAB — I-STAT CG4 LACTIC ACID, ED
Lactic Acid, Venous: 3.08 mmol/L — ABNORMAL HIGH (ref 0.5–2.2)
Lactic Acid, Venous: 1.19 mmol/L (ref 0.5–2.2)

## 2013-10-03 LAB — IRON AND TIBC
IRON: 82 ug/dL (ref 42–135)
Saturation Ratios: 28 % (ref 20–55)
TIBC: 290 ug/dL (ref 250–470)
UIBC: 208 ug/dL (ref 125–400)

## 2013-10-03 LAB — I-STAT TROPONIN, ED: TROPONIN I, POC: 0.09 ng/mL — AB (ref 0.00–0.08)

## 2013-10-03 LAB — AMMONIA: Ammonia: 23 umol/L (ref 11–60)

## 2013-10-03 LAB — FERRITIN: FERRITIN: 767 ng/mL — AB (ref 10–291)

## 2013-10-03 MED ORDER — POTASSIUM CHLORIDE CRYS ER 20 MEQ PO TBCR
40.0000 meq | EXTENDED_RELEASE_TABLET | Freq: Once | ORAL | Status: AC
  Administered 2013-10-03: 40 meq via ORAL
  Filled 2013-10-03: qty 2

## 2013-10-03 MED ORDER — POTASSIUM CHLORIDE ER 10 MEQ PO TBCR
10.0000 meq | EXTENDED_RELEASE_TABLET | Freq: Two times a day (BID) | ORAL | Status: DC
  Administered 2013-10-04 – 2013-10-05 (×3): 10 meq via ORAL
  Filled 2013-10-03 (×4): qty 1

## 2013-10-03 MED ORDER — SODIUM CHLORIDE 0.9 % IJ SOLN
3.0000 mL | Freq: Two times a day (BID) | INTRAMUSCULAR | Status: DC
  Administered 2013-10-03 – 2013-10-05 (×5): 3 mL via INTRAVENOUS

## 2013-10-03 MED ORDER — DULOXETINE HCL 20 MG PO CPEP
20.0000 mg | ORAL_CAPSULE | Freq: Every day | ORAL | Status: DC
  Administered 2013-10-03 – 2013-10-05 (×3): 20 mg via ORAL
  Filled 2013-10-03 (×3): qty 1

## 2013-10-03 MED ORDER — POLYETHYLENE GLYCOL 3350 17 G PO PACK
17.0000 g | PACK | Freq: Every day | ORAL | Status: DC | PRN
  Filled 2013-10-03: qty 1

## 2013-10-03 MED ORDER — CARVEDILOL 12.5 MG PO TABS
12.5000 mg | ORAL_TABLET | Freq: Two times a day (BID) | ORAL | Status: DC
  Administered 2013-10-03 – 2013-10-05 (×5): 12.5 mg via ORAL
  Filled 2013-10-03 (×8): qty 1

## 2013-10-03 MED ORDER — ENOXAPARIN SODIUM 30 MG/0.3ML ~~LOC~~ SOLN
30.0000 mg | Freq: Every day | SUBCUTANEOUS | Status: DC
  Administered 2013-10-03 – 2013-10-05 (×3): 30 mg via SUBCUTANEOUS
  Filled 2013-10-03 (×3): qty 0.3

## 2013-10-03 MED ORDER — SODIUM CHLORIDE 0.9 % IV BOLUS (SEPSIS)
500.0000 mL | Freq: Once | INTRAVENOUS | Status: AC
  Administered 2013-10-03: 500 mL via INTRAVENOUS

## 2013-10-03 MED ORDER — INSULIN ASPART 100 UNIT/ML ~~LOC~~ SOLN
0.0000 [IU] | Freq: Three times a day (TID) | SUBCUTANEOUS | Status: DC
Start: 2013-10-03 — End: 2013-10-05
  Administered 2013-10-04 – 2013-10-05 (×2): 2 [IU] via SUBCUTANEOUS

## 2013-10-03 MED ORDER — SODIUM CHLORIDE 0.45 % IV SOLN
INTRAVENOUS | Status: AC
  Administered 2013-10-03: 05:00:00 via INTRAVENOUS

## 2013-10-03 MED ORDER — SODIUM CHLORIDE 0.9 % IV BOLUS (SEPSIS): 1000.0000 mL | Freq: Once | INTRAVENOUS | Status: DC

## 2013-10-03 MED ORDER — FERROUS SULFATE 325 (65 FE) MG PO TABS
325.0000 mg | ORAL_TABLET | Freq: Two times a day (BID) | ORAL | Status: DC
  Administered 2013-10-03 – 2013-10-05 (×5): 325 mg via ORAL
  Filled 2013-10-03 (×8): qty 1

## 2013-10-03 MED ORDER — POTASSIUM CHLORIDE CRYS ER 20 MEQ PO TBCR
40.0000 meq | EXTENDED_RELEASE_TABLET | Freq: Once | ORAL | Status: AC
  Administered 2013-10-03: 40 meq via ORAL

## 2013-10-03 MED ORDER — ATORVASTATIN CALCIUM 10 MG PO TABS
10.0000 mg | ORAL_TABLET | Freq: Every morning | ORAL | Status: DC
  Administered 2013-10-03 – 2013-10-05 (×3): 10 mg via ORAL
  Filled 2013-10-03 (×3): qty 1

## 2013-10-03 MED ORDER — POTASSIUM CHLORIDE ER 10 MEQ PO TBCR
10.0000 meq | EXTENDED_RELEASE_TABLET | Freq: Two times a day (BID) | ORAL | Status: DC
  Administered 2013-10-03: 10 meq via ORAL
  Filled 2013-10-03 (×2): qty 1

## 2013-10-03 NOTE — ED Notes (Signed)
Pt reports taking her Turosemide 4 times yesterday, instead of just twice. Pt reports lightheadedness.

## 2013-10-03 NOTE — ED Notes (Signed)
Patient presents via EMS.  EMS reports patient was c/o weakness and per family she was lethargic and disoriented.  CBG 264 O2 sats 99% on 2l/[place]  Patient was able to answer all questions.  BP 98/70

## 2013-10-03 NOTE — ED Provider Notes (Addendum)
CSN: [id number]     Arrival date & time 10/03/13  0024 History   First MD Initiated Contact with Patient 10/03/13 0029     No chief complaint on file.    (Consider location/radiation/quality/duration/timing/severity/associated sxs/prior Treatment) HPI  This patient is an 80 yo woman with an extensive PMH who comes in with complaints of "I feel bad". She has felt generally weak and a bit confused since awakening yesterday morning.  She says she got confused while taking her medications and believes that she took twice the amount of torsemide than what she is prescribed. She says this does not usually happen. Patient lives with her son but, does her own meds.   Patient endorses abdominal pain which, she says, is mild and "all over" and "kind of cramping". NO chest pain, cough, fever, SOB, vomiting, diarrhea, dysuria.   Patient was documented to have a BP of 79/40s en route.   Past Medical History  Diagnosis Date  . Macular hole of left eye     told will lose sight  . NICM (nonischemic cardiomyopathy)   . Atrial fibrillation     off anticoagulation presumed to anemia  . Coronary atherosclerosis     and old MI and hx of CVA   . Dysphagia   . DM (diabetes mellitus)     nueropathy  . HTN (hypertension)   . HLD (hyperlipidemia)   . Incontinence   . CHF (congestive heart failure)     related to dyastolic dysfunction  . CAD (coronary artery disease)     nonobstructive  . Scoliosis   . Repeated falls     hx  . Stasis dermatitis   . Hearing loss   . Anemia   . CHF (congestive heart failure)     cardiacc ath EF 25-30%  . Permanent atrial fibrillation   . Symptomatic bradycardia     sp PPM 1997  . Pacemaker     Dr. Olevia Perches; St. Jude VVI  . Heart murmur   . Anginal pain   . Myocardial infarction     "I've had 2" (09/02/2013)  . Pneumonia ~ 2003 X 1  . Neuropathy, diabetic   . History of blood transfusion     "I've had ~ 8 in my life; related to having children"  . Stroke    "I've had 2" (09/02/2013)  . Osteoarthritis     "left foot" (09/02/2013)  . Gout   . Depression     "was taking RX; not depressed now" (09/02/2013)  . CRI (chronic renal insufficiency)     cr basleine 1.5-1.6  . Chronic renal failure, stage 4 (severe)     [name] 09/02/2013  . Melanoma     "inner right leg; left knee; left ankle"   Past Surgical History  Procedure Laterality Date  . Ankle fracture surgery Left   . Bladder suspension      "tucked up"  . Melanoma excision  X 3    "inner right leg; left knee; left ankle"  . Appendectomy    . Esophagogastroduodenoscopy  11/02/2011    Procedure: ESOPHAGOGASTRODUODENOSCOPY (EGD);  Surgeon: Irene Shipper, MD;  Location: [facility] ENDOSCOPY;  Service: Endoscopy;  Laterality: N/A;  . Insert / replace / remove pacemaker  09/27/1995, 04/27/2005    SJM pacemaker implanted by Dr Olevia Perches, generator change 04/27/05  . Tonsillectomy    . Total abdominal hysterectomy    . Tubal ligation    . Cataract extraction w/ intraocular lens  implant, bilateral Bilateral   .  Pars plana vitrectomy w/ repair of macular hole Left   . Cardiac catheterization     Family History  Problem Relation Age of Onset  . Heart attack      family hx  . Colon cancer      family hx  . Stroke      family hx  . Diabetes      DM - family hx   . COPD Mother    History  Substance Use Topics  . Smoking status: Never Smoker   . Smokeless tobacco: Never Used  . Alcohol Use: No   OB History   Grav Para Term Preterm Abortions TAB SAB Ect Mult Living                 Review of Systems    Allergies  Paroxetine; Codeine phosphate; Hydrocodone; and Tramadol hcl  Home Medications   Prior to Admission medications   Medication Sig Start Date End Date Taking? Authorizing Provider  acetaminophen (TYLENOL) 325 MG tablet Take 650 mg by mouth every 6 (six) hours as needed for moderate pain.    Historical Provider, MD  atorvastatin (LIPITOR) 10 MG tablet Take 10 mg by mouth every  morning.    Historical Provider, MD  carvedilol (COREG) 12.5 MG tablet Take 1 tablet (12.5 mg total) by mouth 2 (two) times daily with a meal. 09/05/13   Lavon Paganini, MD  diltiazem (CARTIA XT) 240 MG 24 hr capsule Take 1 capsule (240 mg total) by mouth daily. 07/09/13   Zigmund Gottron, MD  DULoxetine (CYMBALTA) 30 MG capsule Take 1 capsule (30 mg total) by mouth daily. 07/07/13   Zigmund Gottron, MD  ferrous sulfate 325 (65 FE) MG tablet Take 1 tablet (325 mg total) by mouth 3 (three) times daily with meals. 12/19/12   Zigmund Gottron, MD  LANTUS SOLOSTAR 100 UNIT/ML Solostar Pen Inject 10 Units into the skin daily at 10 pm.  07/17/13   Historical Provider, MD  metolazone (ZAROXOLYN) 5 MG tablet Take 1 tablet (5 mg total) by mouth daily. 09/30/13   Zigmund Gottron, MD  NON FORMULARY Oxygen; 2 L, Dx 428.22 86% on RA     Historical Provider, MD  pantoprazole (PROTONIX) 40 MG tablet Take 1 tablet (40 mg total) by mouth daily at 6 (six) AM. 06/22/13   Zigmund Gottron, MD  polyethylene glycol [facility] Floria Raveling) packet Take 17 g by mouth daily as needed for moderate constipation.    Historical Provider, MD  potassium chloride (K-DUR) 10 MEQ tablet Take 1 tablet (10 mEq total) by mouth 2 (two) times daily. 09/30/13   Zigmund Gottron, MD  torsemide (DEMADEX) 100 MG tablet Take 1 tablet (100 mg total) by mouth 2 (two) times daily. 09/06/13   Patrecia Pour, MD   There were no vitals taken for this visit. Physical Exam  ED Course  Procedures (including critical care time) Labs Review   Results for orders placed during the hospital encounter of 10/03/13 (from the past 24 hour(s))  CBC     Status: Abnormal   Collection Time    10/03/13 12:58 AM      Result Value Ref Range   WBC 5.9  4.0 - 10.5 K/uL   RBC 3.14 (*) 3.87 - 5.11 MIL/uL   Hemoglobin 9.1 (*) 12.0 - 15.0 g/dL   HCT 26.8 (*) 36.0 - 46.0 %   MCV 85.4  78.0 - 100.0 fL   MCH 29.0  26.0 -  34.0 pg   MCHC 34.0   30.0 - 36.0 g/dL   RDW 14.8  11.5 - 15.5 %   Platelets 132 (*) 150 - 400 K/uL  BASIC METABOLIC PANEL     Status: Abnormal   Collection Time    10/03/13 12:58 AM      Result Value Ref Range   Sodium 136 (*) 137 - 147 mEq/L   Potassium 3.0 (*) 3.7 - 5.3 mEq/L   Chloride 81 (*) 96 - 112 mEq/L   CO2 32  19 - 32 mEq/L   Glucose, Bld 244 (*) 70 - 99 mg/dL   BUN 157 (*) 6 - 23 mg/dL   Creatinine, Ser 3.95 (*) 0.50 - 1.10 mg/dL   Calcium 9.7  8.4 - 10.5 mg/dL   GFR calc non Af Amer 10 (*) >90 mL/min   GFR calc Af Amer 11 (*) >90 mL/min   Anion gap 23 (*) 5 - 15  AMMONIA     Status: None   Collection Time    10/03/13 12:58 AM      Result Value Ref Range   Ammonia 23  11 - 60 umol/L  I-STAT TROPOININ, ED     Status: Abnormal   Collection Time    10/03/13  1:06 AM      Result Value Ref Range   Troponin i, poc 0.09 (*) 0.00 - 0.08 ng/mL   Comment NOTIFIED PHYSICIAN     Comment 3           I-STAT CG4 LACTIC ACID, ED     Status: Abnormal   Collection Time    10/03/13  1:08 AM      Result Value Ref Range   Lactic Acid, Venous 3.08 (*) 0.5 - 2.2 mmol/L   EKG: nsr, no acute ischemic changes, short PR interval, left l axis, wide complex QRS  CRITICAL CARE Performed by: Elyn Peers   Total critical care time: 85m  Critical care time was exclusive of separately billable procedures and treating other patients.  Critical care was necessary to treat or prevent imminent or life-threatening deterioration.  Critical care was time spent personally by me on the following activities: development of treatment plan with patient and/or surrogate as well as nursing, discussions with consultants, evaluation of patient's response to treatment, examination of patient, obtaining history from patient or surrogate, ordering and performing treatments and interventions, ordering and review of laboratory studies, ordering and review of radiographic studies, pulse oximetry and re-evaluation of patient's  condition.   MDM   Patient has a lactic acidosis and significant azotemia - BUN 157. Cr is stable in the 3s. Hypokalemic to 3.0.   Suspect that underlying problem is overdiureses causing worsened azotemia and hypotensive. Patient has responded very well to IVF with normalization of her BP. Troponin is insignficantly elevated in the patient with CKD. However, we will trend. Hospitalist paged to request admission.     Elyn Peers, MD 10/03/13 0222  FP to admit.   Elyn Peers, MD 10/03/13 [phone]

## 2013-10-03 NOTE — H&P (Signed)
[facility] Admission History and Physical Service Pager: [phone]  Patient name: [name] Medical record number: [id number] Date of birth: 1930-06-07 Age: 80 y.o. Gender: female  Primary Care Provider: Zigmund Gottron, MD Consultants: none Code Status: DNR  Chief Complaint: dizziness  Assessment and Plan: [name] is a 80 y.o. female presenting with confusion and AKI on CKD after taking too much demadex. PMH is significant for diastolic CHF s/p ppm, CAD, HOCM, DDD, CVA, A fib, HLD, DM2, DDD, and melanoma  #Confusion, dizziness- difficult to say whether this proceeded the ingestion of medication or after (and whether double dose medication was one simple act of forgetfulness). Fortunately CT head appears without acute abnormality. Potentially related to an infectious etiology but no white count or fever and no infiltrate seen on CXR. U/A has yet to be collected. Elevated troponin/EKG with PVCs potentially suggestive of cardiac etiology -admit to tele under Dr. Mingo Amber -consider MMSE later in the morning -d/w Dr. Andria Frames her functional status at home (appears he knows pt quite well) -will also attempt to safely get pt UOB -PT/OT consults to follow for assistance at home  -cycle trops, repeat EKG in am -avoid mind altering medications -reorient as much as possible, encourage fm members presence  #AKI on CKD stage 4- likely prerenal after taking medication inappropriately. Unclear of cause for doubling up on second dose. Pt usually responsible for medications. CT head negative. -gentle fluids given CHF -trend BMETs -holding demadex and metolazone for now, consider restart with improvement of pressures and renal fxn  #Hypotension- BPs 90s-100s/40s in the ED; s/p 2 500cc boluses; would be cautious abt fluids in elderly pt with CHF but want to give enough preload to prevent hypoperfusion of kidnies.  Will hold dilt for now and cont BB -monitor  closely  #CHF NYHA class 3, CAD, HOCM, afib- Does not appear to be in an acute exacerbation. doing well for the most part, less dyspnea at home; no recent falls, no dizziness; dry weight est to be around 140lbs; on chronic O2 therapy. Difficult diuresis given her renal issues, on both metolazone and demadex. Anticoag held 2/2 fall risk -cont carvedilol, statin, hold dilt for now -consider repeat CXR after fluids -strict i/o -daily weights -avoid inotropic agents   #HLD - last LDL 51, 08/20/2013  - continue lipitor  #Diabetes- Last A1C 6.3, well controlled  - hold home lantus- 10 units  - SSI, careful with renal disease  #Hypokalemia- likely 2/2 losses; repleted in the ED with 40Kdur -Kdur -serial BMETs  #Anemia- likely chronic 2/2 underlying renal dx. No signs or sx of blood loss; will obtain iron panel and FOBT in case microscopic blood loss reason for her confusion and hypotension -FOBT -iron panels   #Depression  - at baseline, continue cymbalta (but at slightly decreased dose given above)  FEN/GI: HH/CB; IVF @50  Prophylaxis: lovenox adjusted for renal dx  Disposition: admit to tele under Dr. Mingo Amber  History of Present Illness: [name] is a 80 y.o. female presenting with general malaise and weakness. Pt reportedly confused earlier and took twice the rx for demadex than she normally does. Son also states that she was "talking out of her head" and acting very dizzy but no actual falls. Pt responsible for her own medications for the most part, although she does currently reside with her son. Additionally has cramping abdominal discomfort. But denies cp, sob, fever, chills, or dysuria or pain with defecation. EMS called, pt noted to  have BP of 70s/40s on way. Started feeling intermittent sharp pains in chest Friday morning after seeing Dr. Andria Frames on Thursday in clinic.   In the ED was noted to have AKI on CKD and found to be hypotensive. Lactate 3. CBC with hgb of 9. CT head  obtained without acute abnormality. CXR with right basilar atelectasis. EKG with multiple PVCs. Itrop 0.09.   Review Of Systems: Per HPI with the following additions: none. Otherwise 12 point review of systems was performed and was unremarkable.  Patient Active Problem List   Diagnosis Date Noted  . Right heart failure, NYHA class 3 10/01/2013  . Hypokalemia 10/01/2013  . Muscle weakness (generalized) 09/10/2013  . Unspecified constipation 05/18/2013  . Wide-complex tachycardia 02/19/2012  . Gastric ulcer 11/02/2011  . Coagulopathy 11/01/2011  . Chronic kidney disease (CKD), stage IV (severe) 07/20/2011  . GERD (gastroesophageal reflux disease) 04/24/2011  . Diastolic CHF, chronic 62/22/9798  . Encounter for end of life care 04/12/2010  . Chronic diastolic congestive heart failure 10/14/2008  . PACEMAKER-St.Jude 10/14/2008  . MACULAR DEGENERATION, SENILE 09/22/2008  . CERVICALGIA 07/29/2008  . DIVERTICULOSIS, COLON 12/18/2007  . DIABETIC PERIPHERAL NEUROPATHY 03/10/2007  . UNSPECIFIED HEARING LOSS 01/20/2007  . HX, PERSONAL, MALIGNANCY, SKIN MELANOMA 10/21/2006  . ANEMIA NEC 06/13/2006  . DEGENERATION, LUMBAR/LUMBOSACRAL DISC 05/17/2006  . SCOLIOSIS, LUMBAR SPINE 05/17/2006  . Atrial fibrillation 04/18/2006  . DIABETES MELLITUS, II, COMPLICATIONS 92/12/9415  . HYPERCHOLESTEROLEMIA 04/11/2006  . HYPERTRIGLYCERIDEMIA 04/11/2006  . DEPRESSIVE DISORDER, NOS 04/11/2006  . HYPERTENSION, BENIGN SYSTEMIC 04/11/2006  . MYOCARDIAL INFARCTION, OLD 04/11/2006  . CORONARY, ARTERIOSCLEROSIS 04/11/2006  . CVA 04/11/2006  . STASIS DERMATITIS 04/11/2006  . OSTEOARTHRITIS, MULTI SITES 04/11/2006  . INCONTINENCE/ENURESIS, NOS 04/11/2006   Past Medical History: Past Medical History  Diagnosis Date  . Macular hole of left eye     told will lose sight  . NICM (nonischemic cardiomyopathy)   . Atrial fibrillation     off anticoagulation presumed to anemia  . Coronary atherosclerosis     and  old MI and hx of CVA   . Dysphagia   . DM (diabetes mellitus)     nueropathy  . HTN (hypertension)   . HLD (hyperlipidemia)   . Incontinence   . CHF (congestive heart failure)     related to dyastolic dysfunction  . CAD (coronary artery disease)     nonobstructive  . Scoliosis   . Repeated falls     hx  . Stasis dermatitis   . Hearing loss   . Anemia   . CHF (congestive heart failure)     cardiacc ath EF 25-30%  . Permanent atrial fibrillation   . Symptomatic bradycardia     sp PPM 1997  . Pacemaker     Dr. Olevia Perches; St. Jude VVI  . Heart murmur   . Anginal pain   . Myocardial infarction     "I've had 2" (09/02/2013)  . Pneumonia ~ 2003 X 1  . Neuropathy, diabetic   . History of blood transfusion     "I've had ~ 8 in my life; related to having children"  . Stroke     "I've had 2" (09/02/2013)  . Osteoarthritis     "left foot" (09/02/2013)  . Gout   . Depression     "was taking RX; not depressed now" (09/02/2013)  . CRI (chronic renal insufficiency)     cr basleine 1.5-1.6  . Chronic renal failure, stage 4 (severe)     [name]  09/02/2013  . Melanoma     "inner right leg; left knee; left ankle"   Past Surgical History: Past Surgical History  Procedure Laterality Date  . Ankle fracture surgery Left   . Bladder suspension      "tucked up"  . Melanoma excision  X 3    "inner right leg; left knee; left ankle"  . Appendectomy    . Esophagogastroduodenoscopy  11/02/2011    Procedure: ESOPHAGOGASTRODUODENOSCOPY (EGD);  Surgeon: Irene Shipper, MD;  Location: [facility] ENDOSCOPY;  Service: Endoscopy;  Laterality: N/A;  . Insert / replace / remove pacemaker  09/27/1995, 04/27/2005    SJM pacemaker implanted by Dr Olevia Perches, generator change 04/27/05  . Tonsillectomy    . Total abdominal hysterectomy    . Tubal ligation    . Cataract extraction w/ intraocular lens  implant, bilateral Bilateral   . Pars plana vitrectomy w/ repair of macular hole Left   . Cardiac catheterization      Social History: History  Substance Use Topics  . Smoking status: Never Smoker   . Smokeless tobacco: Never Used  . Alcohol Use: No   Additional social history: none Please also refer to relevant sections of EMR.  Family History: Family History  Problem Relation Age of Onset  . Heart attack      family hx  . Colon cancer      family hx  . Stroke      family hx  . Diabetes      DM - family hx   . COPD Mother    Allergies and Medications: Allergies  Allergen Reactions  . Paroxetine Other (See Comments)    hallucinations and falls  . Codeine Phosphate Rash  . Hydrocodone Itching, Nausea Only and Rash  . Tramadol Hcl Hives and Other (See Comments)        No current facility-administered medications on file prior to encounter.   Current Outpatient Prescriptions on File Prior to Encounter  Medication Sig Dispense Refill  . atorvastatin (LIPITOR) 10 MG tablet Take 10 mg by mouth every morning.      . carvedilol (COREG) 12.5 MG tablet Take 1 tablet (12.5 mg total) by mouth 2 (two) times daily with a meal.  60 tablet  0  . diltiazem (CARTIA XT) 240 MG 24 hr capsule Take 1 capsule (240 mg total) by mouth daily.  90 capsule  3  . LANTUS SOLOSTAR 100 UNIT/ML Solostar Pen Inject 10 Units into the skin daily at 10 pm.       . metolazone (ZAROXOLYN) 5 MG tablet Take 1 tablet (5 mg total) by mouth daily.  90 tablet  3  . NON FORMULARY Oxygen; 3 L, Dx 428.22 86% on RA      . pantoprazole (PROTONIX) 40 MG tablet Take 1 tablet (40 mg total) by mouth daily at 6 (six) AM.  90 tablet  3  . polyethylene glycol (MIRALAX / GLYCOLAX) packet Take 17 g by mouth daily as needed for moderate constipation.      . potassium chloride (K-DUR) 10 MEQ tablet Take 1 tablet (10 mEq total) by mouth 2 (two) times daily.  180 tablet  3  . torsemide (DEMADEX) 100 MG tablet Take 1 tablet (100 mg total) by mouth 2 (two) times daily.  60 tablet  0  . DULoxetine (CYMBALTA) 30 MG capsule Take 1 capsule (30 mg  total) by mouth daily.  90 capsule  3    Objective: BP 101/46  Pulse 101  Temp(Src) 97.6 F (36.4 C)  Resp 15  SpO2 100% Exam: General: alert, awake, son at bedside HEENT: NCAT, EOMI, PERRL Cardiovascular: RRR, no m/r//g Respiratory: left basilar crackle but otherwise CTAB; on 3LO2 Abdomen: SNTND, BS normoactive Extremities: WWP, no edema appreciated BL; chronic venous stasis  Skin: scarring from melanomas present; no ulcers appreciated on full skin review Neuro: A&OX3; no neuro deficits, unable to safely assess gait at this time  Labs and Imaging: CBC BMET   Recent Labs Lab 10/03/13 0058  WBC 5.9  HGB 9.1*  HCT 26.8*  PLT 132*    Recent Labs Lab 10/03/13 0058  NA 136*  K 3.0*  CL 81*  CO2 32  BUN 157*  CREATININE 3.95*  GLUCOSE 244*  CALCIUM 9.7     itrop elevated Lactate 3 CXR neg EKG mult PVCs  Bernadene Bell, MD 10/03/2013, 3:33 AM PGY-2, [place] [facility] pager: [phone], text pages welcome

## 2013-10-03 NOTE — H&P (Signed)
FMTS Attending Admission Note: Annabell Sabal MD Personal pager:  [phone] FPTS Service Pager:  [phone]  I  have seen and examined this patient, reviewed their chart. I have discussed this patient with the resident. I agree with the resident's findings, assessment and care plan.  Additionally:  80 yo F well known to me who presents with some altered mental status and AKI after overdosing on diuretic, accidentally.  Has history of diastolic CHF, pacemaker, CAD, and other chronic comorbidities.  She is nearing the end of her life from cardio-pulmonary standpoint.  This AM, she is doing much better, back to her baseline.  Asking to go home.  She is awake and alert, can remember fully what happened yesterday and reason for hospitalization.  Heart RRR. Lungs actually fairly clear, better than I have heard from her in a while.  Ext without edema.  No gross neurological deficits.   Imp/Plan; 1.  AKI on CKD Stage 4:   - has unfortunately suffered another hit to her kidneys.  Secondary to accidental Demedex overuse.   - GFR now <15.  BUN elevated, revealing dehydration as well. - Believe this is cause for her acute delirium, which has now completely resolved.   - Rechecking BMET at 1 pm today to further eval K+ and creatinine/BUN  2.  Hypotension:   - also resolved.  Likely secondary to over-diuresis.   - Hold on any further IV fluids.  Replete orally as needed.  Her CHF/renal status is fairly tenuous. - Agree with strict I/Os.    3.  CHF: - nearing end-stage - She has adamantly refused hospice/palliative care in past.   - DC home as soon as stable/possible. Hoping for very short hospitalization.      Alveda Reasons, MD 10/03/2013 11:44 AM

## 2013-10-03 NOTE — Progress Notes (Signed)
UR completed 

## 2013-10-03 NOTE — Progress Notes (Signed)
PGY-3 Interim Note  S: Pt seen at bedside with attending Dr. Mingo Amber. Pt states she's feeling better and would like to go home soon. Denies current pain, SOB, abdominal issues; does report no appetite this morning, but not because of N/V or pain.  O:  BP 110/52  Pulse 63  Temp(Src) 97.7 F (36.5 C) (Oral)  Resp 17  Ht 5\' 5"  (1.651 m)  Wt 127 lb 14.4 oz (58.015 kg)  BMI 21.28 kg/m2  SpO2 95% Gen: elderly female, awake / alert Cardio: RRR, no murmur appreciaetd Pulm: CTAB, no wheezes, on RA Abd: soft, nontender, BS+ Ext: warm, well-perfused; chronic skin changes from presumed venous stasis, no true edema appreciated  A/P: 80yo female with confusion (apparently improved) and AKI with CKD, s/p accidental overuse of Demadex - BP improved, EKG unchanged, iSTAT troponin slightly elevated but serum troponin negative so far - continue to hold diuretics for now, with only gentle IVF at least until PO intake improves - otherwise f/u labs / testing as per HPI - possible D/C home 8/23, pending improvement in Cr trend and stabilization of BP's - see also attending notes / cosign of H&P  Emmaline Kluver, MD PGY-3, [facility] 10/03/2013, 10:22 AM FPTS Service pager: [phone] (text pages welcome through [facility])

## 2013-10-03 NOTE — Progress Notes (Signed)
Admitted pt from ED  Oriented x3. Oriented to call bell and room. Tele on. VS taken and recorded.)2 on at [place]. No sob noted. IVF 1/2 NS started as ordered.

## 2013-10-04 LAB — GLUCOSE, CAPILLARY
GLUCOSE-CAPILLARY: 222 mg/dL — AB (ref 70–99)
Glucose-Capillary: 105 mg/dL — ABNORMAL HIGH (ref 70–99)
Glucose-Capillary: 156 mg/dL — ABNORMAL HIGH (ref 70–99)
Glucose-Capillary: 87 mg/dL (ref 70–99)

## 2013-10-04 LAB — BASIC METABOLIC PANEL
Anion gap: 20 — ABNORMAL HIGH (ref 5–15)
BUN: 144 mg/dL — ABNORMAL HIGH (ref 6–23)
CHLORIDE: 87 meq/L — AB (ref 96–112)
CO2: 34 meq/L — AB (ref 19–32)
Calcium: 9.6 mg/dL (ref 8.4–10.5)
Creatinine, Ser: 3.42 mg/dL — ABNORMAL HIGH (ref 0.50–1.10)
GFR calc Af Amer: 13 mL/min — ABNORMAL LOW (ref >90)
GFR calc non Af Amer: 12 mL/min — ABNORMAL LOW (ref >90)
GLUCOSE: 110 mg/dL — AB (ref 70–99)
POTASSIUM: 2.6 meq/L — AB (ref 3.7–5.3)
SODIUM: 141 meq/L (ref 137–147)

## 2013-10-04 LAB — CBC
HEMATOCRIT: 28.8 % — AB (ref 36.0–46.0)
HEMOGLOBIN: 9.6 g/dL — AB (ref 12.0–15.0)
MCH: 28.9 pg (ref 26.0–34.0)
MCHC: 33.3 g/dL (ref 30.0–36.0)
MCV: 86.7 fL (ref 78.0–100.0)
Platelets: 113 10*3/uL — ABNORMAL LOW (ref 150–400)
RBC: 3.32 MIL/uL — ABNORMAL LOW (ref 3.87–5.11)
RDW: 14.5 % (ref 11.5–15.5)
WBC: 5.4 10*3/uL (ref 4.0–10.5)

## 2013-10-04 MED ORDER — POTASSIUM CHLORIDE 10 MEQ/100ML IV SOLN
10.0000 meq | INTRAVENOUS | Status: AC
  Administered 2013-10-04 (×3): 10 meq via INTRAVENOUS
  Filled 2013-10-04 (×3): qty 100

## 2013-10-04 MED ORDER — CEPHALEXIN 500 MG PO CAPS
500.0000 mg | ORAL_CAPSULE | Freq: Every day | ORAL | Status: DC
  Filled 2013-10-04: qty 1

## 2013-10-04 MED ORDER — SODIUM CHLORIDE 0.9 % IV BOLUS (SEPSIS)
500.0000 mL | Freq: Once | INTRAVENOUS | Status: AC
  Administered 2013-10-04: 500 mL via INTRAVENOUS

## 2013-10-04 MED ORDER — SULFAMETHOXAZOLE-TMP DS 800-160 MG PO TABS: 1.0000 | ORAL_TABLET | Freq: Two times a day (BID) | ORAL | Status: DC

## 2013-10-04 NOTE — Progress Notes (Signed)
Family Medicine Teaching Service Daily Progress Note Intern Pager: [phone]  Patient name: [name] Medical record number: [id number] Date of birth: Jun 22, 1930 Age: 80 y.o. Gender: female  Primary Care Provider: Zigmund Gottron, MD Consultants: None Code Status: DNR  Pt Overview and Major Events to Date:  - Admitted 8/22 for  General malaise / weakness and acute Delerium after Demadex overdose.   Assessment and Plan:  [name] is a 80 y.o. female presenting with confusion and AKI on CKD after taking too much demadex. PMH is significant for diastolic CHF s/p ppm, CAD, HOCM, DDD, CVA, A fib, HLD, DM2, DDD, and melanoma   #Confusion, dizziness- difficult to say whether this proceeded the ingestion of medication or after (and whether double dose medication was one simple act of forgetfulness). Fortunately CT head appears without acute abnormality. Potentially related to an infectious etiology given evidence of urinary tract infection, cbc pending this am (WBC initially unremarkable), no fever and no infiltrate seen on CXR. Elevated troponin with normal trend , EKG with PVCs potentially suggestive of cardiac etiology  -admitted to tele under Dr. Mingo Amber  - Acute delerium resolved at this time.  -d/w Dr. Andria Frames her functional status at home (appears he knows pt quite well)  -attempt to safely get pt UOB  -PT/OT consults to follow for assistance at home  - troponin trend negative, EKG with PVC's, and atrial pacer noted, ST changes consistent with paced rhythm.  -avoid mind altering medications  - Currently oriented.  - Urinalysis with Pyuria and Bacteriuria. Will f/u symptoms and treat as indicated.   #AKI on CKD stage 4- likely prerenal after taking medication inappropriately. Unclear of cause for doubling up on second dose. Pt usually responsible for medications. CT head negative.  -gentle fluids given CHF  -trend BMETs. Cr trending down. Not at baseline (2.7) yet.   -holding  demadex and metolazone for now, may consider restart at discharge with improvement of pressures and renal fxn. Fluid status stable  - Following urine output, GFR currently 12 will give 500cc bolus and watch UOP for improved kidney function.    #Hypotension- BPs 90s-100s/40s in the ED; s/p 2 500cc boluses; would be cautious abt fluids in elderly pt with CHF but want to give enough preload to prevent hypoperfusion of kidnies. Will hold dilt for now and cont BB  -monitor closely 117/62 this am. Down to 100's / 40's. - 500 cc fluid bolus. Will continue to monitor.    #CHF NYHA class 3, CAD, HOCM, afib- Does not appear to be in an acute exacerbation. doing well for the most part, less dyspnea at home; no recent falls, no dizziness; dry weight est to be around 140lbs; on chronic O2 therapy. Difficult diuresis given her renal issues, on both metolazone and demadex. Anticoag held 2/2 fall risk  -cont carvedilol, statin, hold dilt for now  -consider repeat CXR after fluids  -strict i/o, +1.060L -daily weights  -avoid inotropic agents  - 500cc bolus as above. Will monitor her CHF status. No edema, and improved lung exam from baseline at this time.   #HLD  - last LDL 51, 08/20/2013  - continue lipitor   #Diabetes- Last A1C 6.3, well controlled  - hold home lantus- 10 units  - SSI, careful with renal disease   #Hypokalemia- likely 2/2 losses; repleted in the ED with 40Kdur  -Kdur  -serial BMETs   #Anemia- likely chronic 2/2 underlying renal dx. No signs or sx of blood loss; will obtain  iron panel and FOBT in case microscopic blood loss reason for her confusion and hypotension  -FOBT pending -iron panels   #Depression  - at baseline, continue cymbalta (but at slightly decreased dose given above)  FEN/GI: HH/CB; IVF @50   Prophylaxis: lovenox adjusted for renal dx   Disposition: Continue to monitor for improved kidney function.   Subjective:  No complaints this am. Oriented to person, place,  time and date. She was much more talkative. She says that she slept well through the night. She would like to eat this am. She has no other complaints.   Objective: Temp:  [98 F (36.7 C)-98.4 F (36.9 C)] 98.1 F (36.7 C) (08/23 0554) Pulse Rate:  [51-77] 77 (08/23 0554) Resp:  [18] 18 (08/23 0554) BP: (104-125)/(44-62) 117/62 mmHg (08/23 0554) SpO2:  [100 %] 100 % (08/23 0554) Weight:  [125 lb (56.7 kg)] 125 lb (56.7 kg) (08/23 0617) Physical Exam: General: NAD, AAOx3, Resting on her L side Cardiovascular: Irregularly Irregular / Paced rhythm, No grossly audible murmurs, no rubs.  Respiratory: Mild crackles in bilateral bases,  No distress, appropriate rate, no increased WOB Abdomen: Nontender, nondistended, BS+, No organomegaly noted Extremities: WWP, 2+ distal pulses, no edema noted at this time.   Laboratory:  Recent Labs Lab 09/30/13 1436 10/03/13 0058  WBC 9.7 5.9  HGB 10.7* 9.1*  HCT 31.4* 26.8*  PLT 177 132*    Recent Labs Lab 10/03/13 0058 10/03/13 1158 10/04/13 0424  NA 136* 139 141  K 3.0* 3.1* 2.6*  CL 81* 86* 87*  CO2 32 33* 34*  BUN 157* 147* 144*  CREATININE 3.95* 3.42* 3.42*  CALCIUM 9.7 10.0 9.6  GLUCOSE 244* 96 110*   Troponin - 0.3, 0.3, 0.3 Lactate - 3  CXR - Negative EKG - multiple PVC's   Urinalysis - WBC's too numerous to count, Nitrite positive, Large leuks, Many bacteria.    Aquilla Hacker, MD 10/04/2013, 9:13 AM PGY-1, [facility] Intern pager: [phone], text pages welcome

## 2013-10-04 NOTE — Progress Notes (Signed)
FMTS Attending Daily Note:  Annabell Sabal MD  [phone] pager  Family Practice pager:  [phone] I have seen and examined this patient and have reviewed their chart. I have discussed this patient with the resident. I agree with the resident's findings, assessment and care plan.  Additionally:  - Overall, she feels well.   - Poor urine output yesterday -- only 300 cc's.   - No change in creatinine/GFR today.  Only minimal decrease in BUN. - Still with clear lungs.  Still appears rather dry.  Will gently bolus 500 cc then stop.  Encourage PO fluids. - Hopeful this will improve both GFR and urine output.    Alveda Reasons, MD 10/04/2013 12:30 PM

## 2013-10-05 DIAGNOSIS — R410 Disorientation, unspecified: Secondary | ICD-10-CM | POA: Diagnosis present

## 2013-10-05 LAB — URINE CULTURE: SPECIAL REQUESTS: NORMAL

## 2013-10-05 LAB — GLUCOSE, CAPILLARY
GLUCOSE-CAPILLARY: 109 mg/dL — AB (ref 70–99)
Glucose-Capillary: 180 mg/dL — ABNORMAL HIGH (ref 70–99)

## 2013-10-05 LAB — BASIC METABOLIC PANEL
ANION GAP: 16 — AB (ref 5–15)
BUN: 136 mg/dL — ABNORMAL HIGH (ref 6–23)
CHLORIDE: 91 meq/L — AB (ref 96–112)
CO2: 34 meq/L — AB (ref 19–32)
Calcium: 9.4 mg/dL (ref 8.4–10.5)
Creatinine, Ser: 3.01 mg/dL — ABNORMAL HIGH (ref 0.50–1.10)
GFR calc non Af Amer: 14 mL/min — ABNORMAL LOW (ref >90)
GFR, EST AFRICAN AMERICAN: 16 mL/min — AB (ref >90)
Glucose, Bld: 174 mg/dL — ABNORMAL HIGH (ref 70–99)
POTASSIUM: 3.3 meq/L — AB (ref 3.7–5.3)
Sodium: 141 mEq/L (ref 137–147)

## 2013-10-05 LAB — MAGNESIUM: Magnesium: 1.9 mg/dL (ref 1.5–2.5)

## 2013-10-05 NOTE — Discharge Instructions (Signed)
Ms Lindsey, [name] were seen in the hospital for dizziness and confusion after taking too much of your medicine.   We were pleased to see that your kidneys and electrolyte abnormalities got better after some fluids.   It is important to continue to take plenty of fluids to prevent dehydration.  Continue to separate your pills in a pill counter and have someone check behind you.  If you experience worsening confusion, shortness of breath or dizziness please call 911 as these may be signs of a medical emergency  Please take your pills as prescribed and follow up later this week at [facility]. Thanks for letting us take care of you! Feel better soon

## 2013-10-05 NOTE — Care Management Note (Addendum)
  Page 2 of 2   10/06/2013     2:11:26 PM CARE MANAGEMENT NOTE 10/06/2013  Patient:  [name], [name]   Account Number:  [id number]  Date Initiated:  10/05/2013  Documentation initiated by:  [place]  Subjective/Objective Assessment:   Hypotention, AKI, Confused     Action/Plan:   CM to follow for disposition needs   Anticipated DC Date:  10/05/2013   Anticipated DC Plan:  [place]  CM consult      [place]   Choice offered to / List presented to:  C-1 Patient        [place] arranged  HH-1 RN  [place].   Status of service:  Completed, signed off Medicare Important Message given?  NA - LOS <3 / Initial given by admissions (If response is "NO", the following Medicare IM given date fields will be blank) Date Medicare IM given:   Medicare IM given by:   Date Additional Medicare IM given:   Additional Medicare IM given by:    Discharge Disposition:  [place]  Per UR Regulation:  Reviewed for med. necessity/level of care/duration of stay  If discussed at [place] of Stay Meetings, dates discussed:    Comments:  Audriella Blakeley RN, BSN, MSHL, CCM  Nurse - Case Manager,  (Unit [phone]  10/06/2013 Post Discharge NOTE: TC with update from [facility] - states patient refused [facility] on initial attempt to establish services.   Kaiesha Tonner RN, BSN, MSHL, CCM  Nurse - Case Manager,  ([facility] [facility])  [phone]  10/05/2013 Initial IM 10/03/2013  provided by admissions Hx/o overdosing on diuretic, accidentally THN:  yes Home O2: active Disposition Plan:  [facility] RN Disease and MED MGMT: ([facility]/Debbie Lovena Le notified)

## 2013-10-05 NOTE — Progress Notes (Signed)
Family Medicine Teaching Service Daily Progress Note Intern Pager: [phone]  Patient name: [name] Medical record number: [id number] Date of birth: 12-25-1930 Age: 80 y.o. Gender: female  Primary Care Provider: Zigmund Gottron, MD Consultants: None Code Status: DNR  Pt Overview and Major Events to Date:  - Admitted 8/22 for  General malaise / weakness and acute Delerium after Demadex overdose.   Assessment and Plan:  [name] is a 80 y.o. female presenting with confusion and AKI on CKD after taking too much demadex. PMH is significant for diastolic CHF s/p ppm, CAD, HOCM, DDD, CVA, A fib, HLD, DM2, DDD, and melanoma   #Confusion, dizziness- difficult to say whether this proceeded the ingestion of medication or after (and whether double dose medication was one simple act of forgetfulness). Fortunately CT head appears without acute abnormality. Potentially related to an infectious etiology given evidence of urinary tract infection, cbc pending this am (WBC initially unremarkable), no fever and no infiltrate seen on CXR. Elevated troponin with normal trend , EKG with PVCs potentially suggestive of cardiac etiology. Unsure at this point of the exact etiology as pt. Has multiple potential reasons for this episode. At this time, however, her acute delirium has resolved.  -admitted to tele under Dr. Mingo Amber  - Acute delirium resolved at this time.  - Discussed with Dr. Andria Frames her functional status at home (appears he knows pt quite well)  - Pt. Has been safely UOB and visiting with family yesterday afternoon.  - Additionally, discussed with pt. Home health for medication assistance, and pt refused stating that her daughters live at home with her and will help her. She says that her daughters were not home whenever she accidentally took too much of her Demadex.  - troponin trend negative, EKG with PVC's, and atrial pacer noted, ST changes consistent with paced rhythm. -avoiding mind  altering medications at discharge.  - Urinalysis with Pyuria and Bacteriuria. However, pt. Is asymptomatic. No indication to treat at this time.    #AKI on CKD stage 4- likely prerenal after taking medication inappropriately. Unclear of cause for doubling up on second dose. Pt usually responsible for medications. CT head negative.  - Cr 3.01 from 3.42 improving today. Mg- 1.9, - UOP still not improved, and is 0.3 ml/kg/hr down from 0.4 ml/kg/hr, but stable and ok for discharge at this time. She is at her baseline level of functionality.   - holding demadex and metolazone for now, will restart demadex at discharge and hold metalozone. Plan for close follow up and restart of metalozone if necessary.  - S/P 500cc bolus yesterday.    #Hypotension- BPs 90s-100s/40s in the ED; s/p 2 500cc boluses; would be cautious abt fluids in elderly pt with CHF but want to give enough preload to prevent hypoperfusion of kidnies. Will hold dilt for now and cont BB  -monitor closely 120/70 this am and consistently since yesterday afternoon. Previously down to 100's / 40's. - 500 cc fluid bolus yesterday. Safe for discharge at this time.   - May restart diltiazem at discharge.    #CHF NYHA class 3, CAD, HOCM, afib- Does not appear to be in an acute exacerbation. doing well for the most part, less dyspnea at home; no recent falls, no dizziness; dry weight est to be around 140lbs; on chronic O2 therapy. Difficult diuresis given her renal issues, on both metolazone and demadex. Anticoag held 2/2 fall risk  -cont carvedilol, statin, hold dilt for now  -Currently stable without  exacerbation.  -strict i/o, +1.72 L -daily weights, - 3LB. Since admission.  -avoid inotropic agents  - 500cc bolus as above. Will monitor her CHF status. No edema, and improved lung exam from baseline at this time. Will follow up as an outpatient.   #HLD  - last LDL 51, 08/20/2013  - continue lipitor at discharge.   #Diabetes- Last A1C 6.3,  well controlled  - hold home lantus- 10 units  - SSI, careful with renal disease   #Hypokalemia- likely 2/2 losses; repleted in the ED with 40Kdur. Subsequently found to be hypokalemic down to 2.6 during her admission. Repleted as below prior to discharge.   -Kdur in the ED, and subsequently 81meq BID  - Repleted with 58meq IV x 3 yesterday.  - Repeat BMET today with K at 3.3. Will repeat labs as an outpatient. Continue Kdur at discharge, and can D/C at follow up if K normalized.   #Anemia- likely chronic 2/2 underlying renal dx. No signs or sx of blood loss; will obtain iron panel and FOBT in case microscopic blood loss reason for her confusion and hypotension  -FOBT still pending - Stable at this time since 02/2012. Consistent with her CKD state.  -iron panels consistent with an anemia of chronic disease pattern,  #Depression  - at baseline, continue cymbalta (but at slightly decreased dose given above)  FEN/GI: HH/CB; IVF @50   Prophylaxis: lovenox adjusted for renal dx   Disposition: Continue to monitor for improved kidney function.   Subjective:  No complaints this am. Oriented to person, place, time and date. She was much more talkative. She says that she slept well through the night. She would like to eat this am. She has no other complaints.   Objective: Temp:  [97.5 F (36.4 C)-98 F (36.7 C)] 98 F (36.7 C) (08/24 0645) Pulse Rate:  [68-76] 71 (08/24 0645) Resp:  [18-20] 18 (08/24 0645) BP: (106-122)/(60-70) 120/70 mmHg (08/24 0645) SpO2:  [96 %-100 %] 96 % (08/24 0645) Weight:  [124 lb 6.4 oz (56.427 kg)-124 lb 12.8 oz (56.609 kg)] 124 lb 6.4 oz (56.427 kg) (08/24 0659) Physical Exam: General: NAD, AAOx3, Resting on her L side Cardiovascular: Irregularly Irregular / Paced rhythm, No grossly audible murmurs, no rubs.  Respiratory: Mild crackles in bilateral bases,  No distress, appropriate rate, no increased WOB Abdomen: Nontender, nondistended, BS+, No organomegaly  noted Extremities: WWP, 2+ distal pulses, no edema noted at this time.   Laboratory:  Recent Labs Lab 09/30/13 1436 10/03/13 0058 10/04/13 1006  WBC 9.7 5.9 5.4  HGB 10.7* 9.1* 9.6*  HCT 31.4* 26.8* 28.8*  PLT 177 132* 113*    Recent Labs Lab 10/03/13 1158 10/04/13 0424 10/05/13 0949  NA 139 141 141  K 3.1* 2.6* 3.3*  CL 86* 87* 91*  CO2 33* 34* 34*  BUN 147* 144* 136*  CREATININE 3.42* 3.42* 3.01*  CALCIUM 10.0 9.6 9.4  GLUCOSE 96 110* 174*   Troponin - 0.3, 0.3, 0.3 Lactate - 3  CXR - Negative EKG - multiple PVC's   Urinalysis - WBC's too numerous to count, Nitrite positive, Large leuks, Many bacteria.    Aquilla Hacker, MD 10/05/2013, 12:33 PM PGY-1, [place] Intern pager: [phone], text pages welcome

## 2013-10-05 NOTE — Discharge Summary (Signed)
[facility] Discharge Summary  Patient name: [name] Medical record number: [id number] Date of birth: 1930/08/03 Age: 80 y.o. Gender: female Date of Admission: 10/03/2013  Date of Discharge: 10/05/2013 Admitting Physician: Alveda Reasons, MD  Primary Care Provider: Zigmund Gottron, MD Consultants: None  Indication for Hospitalization: Altered Mental Status and AKI  Discharge Diagnoses/Problem List:  Acute Delirium Acute Kidney Injury Atrial Fibrillation Chronic Diastolic CHF with Pacemaker [place] III Chronic Kidney Disease IV Diabetes Mellitus II with Neuropathy HTN GERD Anemia CVA Depression Hyperlipidemia Hypokalemia  Disposition: Home  Discharge Condition: Stable  Discharge Exam:  Temp: [97.5 F (36.4 C)-98 F (36.7 C)] 98 F (36.7 C) (08/24 0645)  Pulse Rate: [68-76] 71 (08/24 0645)  Resp: [18-20] 18 (08/24 0645)  BP: (106-122)/(60-70) 120/70 mmHg (08/24 0645)  SpO2: [96 %-100 %] 96 % (08/24 0645)  Weight: [124 lb 6.4 oz (56.427 kg)-124 lb 12.8 oz (56.609 kg)] 124 lb 6.4 oz (56.427 kg) (08/24 0659)  Physical Exam:  General: NAD, AAOx3, Resting on her L side  Cardiovascular: Irregularly Irregular / Paced rhythm, No grossly audible murmurs, no rubs.  Respiratory: Mild crackles in bilateral bases, No distress, appropriate rate, no increased WOB  Abdomen: Nontender, nondistended, BS+, No organomegaly noted  Extremities: WWP, 2+ distal pulses, no edema noted at this time.   Brief Hospital Course:   #Confusion . /AMS- At presentation to the hospital, the patient had an altered mental status after admitting that she had gotten her medications mixed up, and it was found that she had taken too much demadex. It was difficult to say whether her AMS proeceeded the ingestion of medication or came after (and whether double dose medication was one simple act of forgetfulness). Fortunately, however, her CT head did not have any acute  abnormality. She was initially found to have an elevated troponin with a subsequent normal trend. Her EKG with PVCs potentially suggestive of cardiac etiology, however repeat EKG was stable and without evidence of acute ischemia. We have remained unsure at this point of the exact etiology of her AMS as pt. Has multiple potential reasons for this episode. However, her AMS subsequently resolved and she was found to be stable for discharge. Her daughters are at home and will continue to help her with her medications as she prefers not to go to a SNF.   #AKI on CKD stage 4-  She was found to have an acute kidney injury on admission with an elevation in Cr >3.42 and subsequent resolution with her Cr trending down prior to discharge. The etiology is likely prerenal after taking demadex inappropriately. Her UOP was somewhat low during her admission at 0.60ml/kg/hr, however pt. Has been overall improving and was found to be stable for discharge with close follow up regarding her chronic medical problems.   #Hypotension- BPs 90s-100s/40s in the ED; She was given 500cc boluses x 2; However, we were cautious abt fluids in her given her CHF, but wanted to give enough preload to prevent hypoperfusion of her kidnies.We held her diltiazem initially, but continued her beta blocker. Her blood pressures subsequently normalized, and she did not require any further volume resuscitation. She has been stable throughout this hospitalization, and will plan for close follow up as an outpatient.  #CHF NYHA class 3, CAD, HOCM, afib- Did not appear to be in an acute exacerbation on admission. She has been oing well for the most part despite her AMS. She has less dyspnea at home than previously, and  is much improved from her baseline per Dr. Mingo Amber who knew her well. She has not had any recent falls, dizziness. Her dry weight est to be around 140lbs; She is on chronic O2 therapy. Given her initial overdose with demadex, and her relatively  good lung exam and overall clinical status, we did not diurese her in the hospital. Her demadex was held. She was found to be total weight down at discharge and stable without exacerbation. Her Anticoagulation was held 2/2 fall risk. We continued her carvedilol, and statin, however we held her diltiazem throughout her admission. She was subsequently found stable, euvolemic, and asymptomatic from a cardiac standpoint.    #Hypokalemia- Found to be hypokalemic down to 2.6 during this admission. This is likely 2/2 losses given her overdose of demadex; She was repleted in the ED with 40Kdur. She then required repeated repletion during her hospital stay, and she as continued on Kdur at discharge. She will require close follow up and discontinuation of kdur if she becomes hyperkalemic given her renal disease.  #Anemia-  This is a chronic problem 2/2 underlying renal dx. No signs or sx of blood loss during this admission; Her Iron panel is consistent with anemia of Chronic Disease and CKD. FOBT will obtain iron panel and FOBT negative at this admission. Her hemoglobin was approximately 9 prior to discharge, and is consistent with her baseline Hgb of 8-10.  Issues for Follow Up:  1. Confusion / AMS - Follow up mental status. Follow up plan for medications, and safeguards against overdose.  2. AKI, CKDIV, Anemia of CKD - Follow up Hemoglobin. Pt. May benefit from nephrology referral for epopoietin. Additionally, follow up Cr trend given continued use of demadex.  3. Hypokalemia - follow up K+ status, and discontinue K-Dur vs. Continuing based on K+ level.  4. CHF, Afib, HOCM, CAD - Follow up cardiac status, and lung exam. Follow up clinical volume status.   Significant Procedures: None  Significant Labs and Imaging:  Recent Labs  Lab  09/30/13 1436  10/03/13 0058  10/04/13 1006   WBC  9.7  5.9  5.4   HGB  10.7*  9.1*  9.6*   HCT  31.4*  26.8*  28.8*   PLT  177  132*  113*     Recent Labs  Lab   10/03/13 1158  10/04/13 0424  10/05/13 0949   NA  139  141  141   K  3.1*  2.6*  3.3*   CL  86*  87*  91*   CO2  33*  34*  34*   BUN  147*  144*  136*   CREATININE  3.42*  3.42*  3.01*   CALCIUM  10.0  9.6  9.4   GLUCOSE  96  110*  174*    Troponin - 0.3, 0.3, 0.3  Lactate - 3  CXR - Negative  EKG - multiple PVC's  Urinalysis - WBC's too numerous to count, Nitrite positive, Large leuks, Many bacteria.   CLINICAL DATA: 80 year old female with confusion and  lightheadedness. Initial encounter.  EXAM:  CT HEAD WITHOUT CONTRAST  TECHNIQUE:  Contiguous axial images were obtained from the base of the skull  through the vertex without intravenous contrast.  COMPARISON: 04/26/2005.  FINDINGS:  Visualized paranasal sinuses and mastoids are clear. No acute  osseous abnormality identified. No acute orbit or scalp soft tissue  findings.  Calcified atherosclerosis at the skull base. Chronic infarcts with  encephalomalacia in both MCA territories slightly  larger on the  left. Chronic infarct with encephalomalacia in the left cerebellar  hemisphere. Cerebral volume has not significantly changed. No  ventriculomegaly. No midline shift, mass effect, or evidence of  intracranial mass lesion. No acute intracranial hemorrhage  identified. No evidence of cortically based acute infarction  identified. No suspicious intracranial vascular hyperdensity.  IMPRESSION:  Chronic ischemic disease. No acute intracranial abnormality.  Electronically Signed  By: Lars Pinks M.D.  On: 10/03/2013 02:22  Results/Tests Pending at Time of Discharge: None  Discharge Medications:    Medication List    STOP taking these medications       metolazone 5 MG tablet  Commonly known as:  ZAROXOLYN     NON FORMULARY      TAKE these medications       atorvastatin 10 MG tablet  Commonly known as:  LIPITOR  Take 10 mg by mouth every morning.     carvedilol 12.5 MG tablet  Commonly known as:  COREG  Take  1 tablet (12.5 mg total) by mouth 2 (two) times daily with a meal.     ferrous sulfate 325 (65 FE) MG tablet  Take 325 mg by mouth 2 (two) times daily with a meal.     LANTUS SOLOSTAR 100 UNIT/ML Solostar Pen  Generic drug:  Insulin Glargine  Inject 10 Units into the skin daily at 10 pm.     pantoprazole 40 MG tablet  Commonly known as:  PROTONIX  Take 1 tablet (40 mg total) by mouth daily at 6 (six) AM.     polyethylene glycol packet  Commonly known as:  MIRALAX / GLYCOLAX  Take 17 g by mouth daily as needed for moderate constipation.     potassium chloride 10 MEQ tablet  Commonly known as:  K-DUR  Take 1 tablet (10 mEq total) by mouth 2 (two) times daily.     torsemide 100 MG tablet  Commonly known as:  DEMADEX  Take 1 tablet (100 mg total) by mouth 2 (two) times daily.        Discharge Instructions: Please refer to Patient Instructions section of EMR for full details.  Patient was counseled important signs and symptoms that should prompt return to medical care, changes in medications, dietary instructions, activity restrictions, and follow up appointments.   Follow-Up Appointments:     Follow-up Information   Follow up with Cordelia Poche, MD On 10/08/2013. (2:15pm for labs )    Specialty:  Family Medicine   Contact information:   [place] [address] [phone]       Follow up with [facility]. (Registered Nurse services to start within 24-48 hours of discharge)    Contact information:   [place] [address] [phone]       Caya Soberanis G Ana Woodroof, MD 10/12/2013, 7:20 PM PGY-1, [place]

## 2013-10-05 NOTE — Progress Notes (Signed)
Patient evaluated for community based chronic disease management services with [facility] as a benefit of patient's [employer]. Spoke with patient at bedside to explain [facility] services.  Services accepted with written consent.  Daughters Nihira Puello [phone]) and Londell Moh [phone]) are her authorized contacts.  Patient became confused at home with her medicines and took an overdose of diurectics resulting in low potassium levels.  She indicated that her daughters will assume the medication management responsibilities moving forward.  Her daughters alternate weeks staying with her during the day.  She is also on home oxygen.  Patient will receive a post discharge transition of care call and will be evaluated for monthly home visits for assessments and disease process education.  Left contact information and [facility] literature at bedside. Made Inpatient Case Manager aware that [facility] following. Of note, [facility] Care Management services does not replace or interfere with any services that are arranged by inpatient case management or social work.  For additional questions or referrals please contact Corliss Blacker BSN RN [facility] Liaison at [phone].

## 2013-10-05 NOTE — Progress Notes (Signed)
Patient discharged home with daughter. Medications and discharge instructions reviewed. Daughter and patient verbalizes understanding.

## 2013-10-08 ENCOUNTER — Encounter: Payer: Self-pay | Admitting: Family Medicine

## 2013-10-08 ENCOUNTER — Ambulatory Visit (INDEPENDENT_AMBULATORY_CARE_PROVIDER_SITE_OTHER): Payer: PRIVATE HEALTH INSURANCE | Admitting: Family Medicine

## 2013-10-08 VITALS — BP 103/64 | HR 72 | Temp 97.9°F | Ht 65.0 in | Wt 128.0 lb

## 2013-10-08 DIAGNOSIS — I50810 Right heart failure, unspecified: Secondary | ICD-10-CM

## 2013-10-08 DIAGNOSIS — I509 Heart failure, unspecified: Secondary | ICD-10-CM

## 2013-10-08 DIAGNOSIS — L293 Anogenital pruritus, unspecified: Secondary | ICD-10-CM

## 2013-10-08 DIAGNOSIS — N898 Other specified noninflammatory disorders of vagina: Secondary | ICD-10-CM

## 2013-10-08 DIAGNOSIS — R300 Dysuria: Secondary | ICD-10-CM

## 2013-10-08 MED ORDER — FLUCONAZOLE 150 MG PO TABS: 150.0000 mg | ORAL_TABLET | Freq: Once | ORAL | Status: DC

## 2013-10-08 NOTE — Progress Notes (Signed)
    Subjective   [name] is a 80 y.o. female that presents for a same day visit  1. Hospital follow-up: Breathing better. Patient has been doing less at home. Says she's "tired" but is at baseline. Has been taking torsemide 100mg  BID and potassium BID. No shortness of breath. Wheezing when she first wakes up in the morning but improves over the day. Uses 3L of oxygen daily. Exercise tolerance at baseline. 2. Vaginal itching: started earlier this week. No vaginal discharge or bleeding.  3. Dysuria: started this week. Has frequency and urgency but is on diuretic therapy. No fever, nausea, vomiting or flank pain.  History  Substance Use Topics  . Smoking status: Never Smoker   . Smokeless tobacco: Never Used  . Alcohol Use: No    ROS Per HPI  Objective   BP 103/64  Pulse 72  Temp(Src) 97.9 F (36.6 C) (Oral)  Ht 5\' 5"  (1.651 m)  Wt 128 lb (58.06 kg)  BMI 21.30 kg/m2  SpO2 90%  General: Elderly, looks fair with nasal canula in place. No acute distress HEENT: Nasal canula Respiratory/Chest: Clear to auscultation bilaterally with no wheezing or rales bilaterally Cardiovascular: Irregular rhythm with normal rate Gastrointestinal: no tenderness Extremities: Trace swelling bilaterally at ankles Neuro: Alert and oriented x3  Assessment and Plan   Please refer to problem based charting of assessment and plan

## 2013-10-08 NOTE — Patient Instructions (Signed)
Thank you for coming to see me today. It was a pleasure. Today we talked about:   Shortness of breath: keep taking your torsemide. If your weight gets to 125 pounds, please do not take your torsemide that day.  Pain with urination: I am getting a urine study. I will call you with the results  Vaginal itching: I am prescribing Diflucan  Please make an appointment to see Dr. Andria Frames in 2-4 weeks for follow-up.  If you have any questions or concerns, please do not hesitate to call the office at [phone].  Sincerely,  Cordelia Poche, MD

## 2013-10-11 DIAGNOSIS — N898 Other specified noninflammatory disorders of vagina: Secondary | ICD-10-CM | POA: Insufficient documentation

## 2013-10-11 DIAGNOSIS — R300 Dysuria: Secondary | ICD-10-CM | POA: Insufficient documentation

## 2013-10-11 NOTE — Assessment & Plan Note (Signed)
Will obtain urinalysis and treat pending results.

## 2013-10-11 NOTE — Assessment & Plan Note (Addendum)
Patient doing better since come out of the hospital. Currently stable and not fluid overloaded. Spoke with PCP and set new dry weight of 128 lbs. Instructed patient to hold torsemide if weight hits 125 lbs as going too low has made her extremely decreased energy in the past. Overall, patient is at baseline for weakness and shortness of breath.  Will also refer for home health RN. Spoke with CSW and patient initially refused services after discharge from hospital. Family now requesting services so will leave referral in.

## 2013-10-11 NOTE — Assessment & Plan Note (Signed)
Will empirically treat for yeast infection with Diflucan.

## 2013-10-12 ENCOUNTER — Other Ambulatory Visit: Payer: PRIVATE HEALTH INSURANCE

## 2013-10-12 ENCOUNTER — Telehealth: Payer: Self-pay | Admitting: Family Medicine

## 2013-10-12 DIAGNOSIS — I482 Chronic atrial fibrillation, unspecified: Secondary | ICD-10-CM

## 2013-10-12 MED ORDER — DILTIAZEM HCL ER COATED BEADS 240 MG PO CP24: 240.0000 mg | ORAL_CAPSULE | Freq: Every day | ORAL | Status: DC

## 2013-10-12 MED ORDER — DULOXETINE HCL 30 MG PO CPEP: 30.0000 mg | ORAL_CAPSULE | Freq: Every day | ORAL | Status: AC

## 2013-10-12 NOTE — Telephone Encounter (Signed)
Pt states she has gotten her meds mixed up and needs to speak to Dr. Andria Frames or his nurse ASAP. Pls call.

## 2013-10-13 NOTE — Discharge Summary (Signed)
I discussed with  Dr Minda Ditto.  I agree with their plans documented in their discharges note.

## 2013-10-19 IMAGING — XA IR FLUORO GUIDE CV LINE*R*
1 series · 2 of 2 positions shown · non-contrast
Comparison: none

CLINICAL DATA: GI bleed, coagulopathic, no venous access.

[Series 1: run · 2 of 2 slices shown]
[im 1/2]
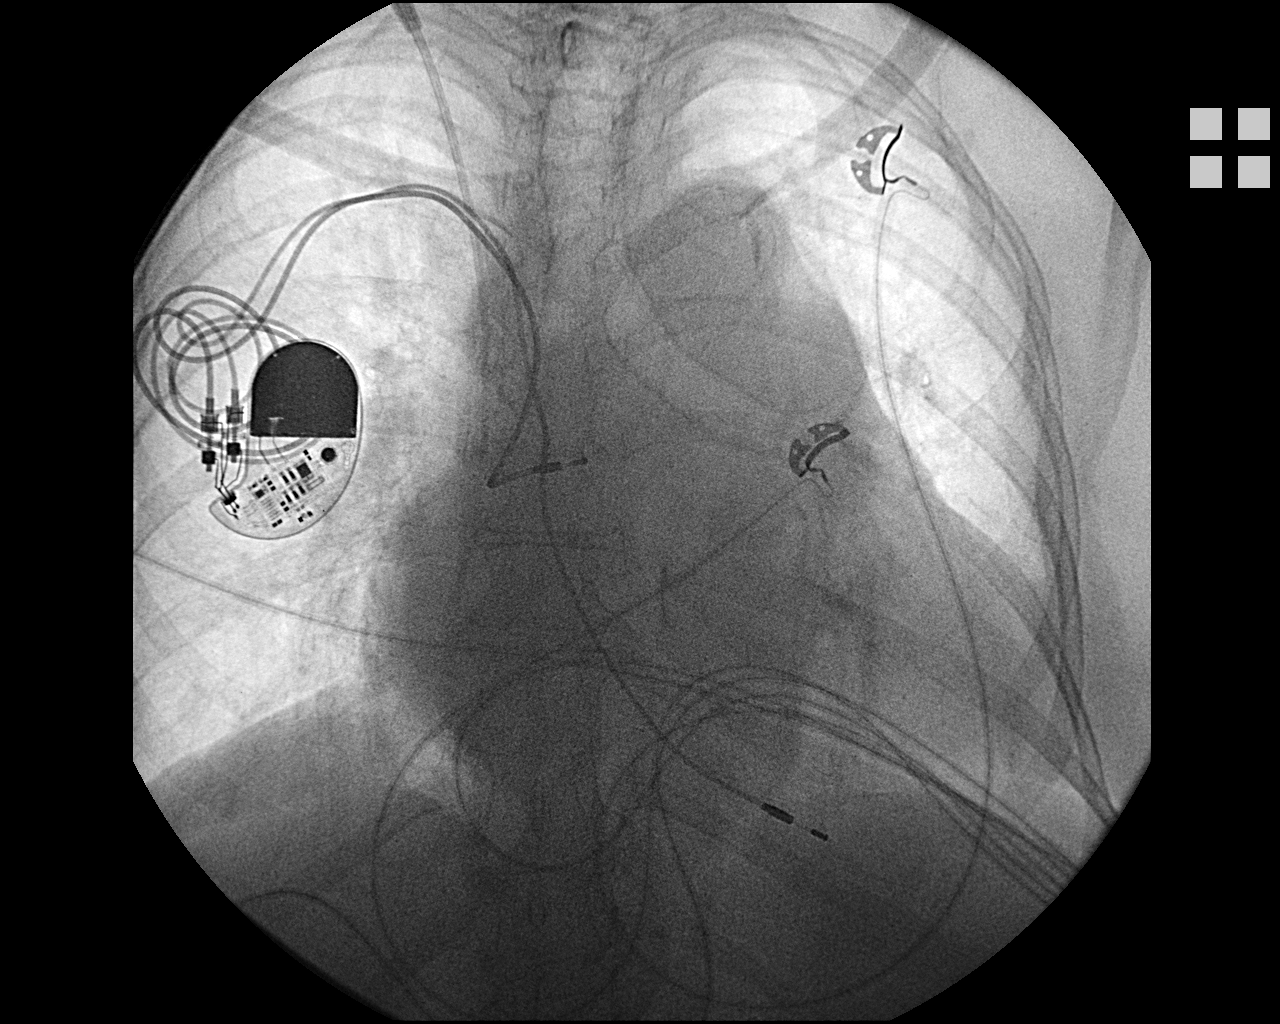
[im 2/2]
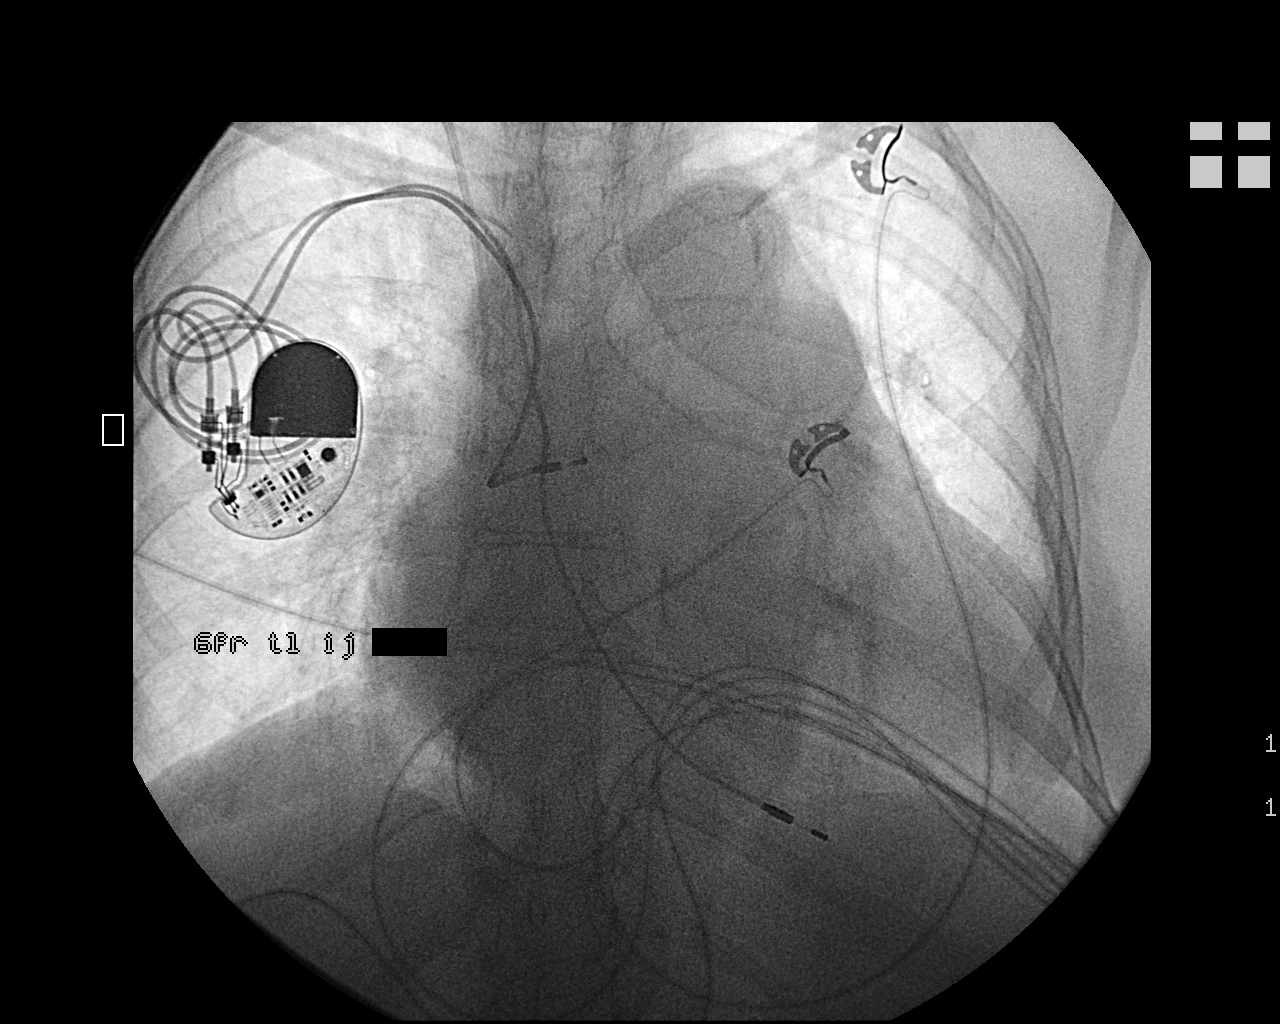

[2 of 2 positions shown; findings below may reference images not displayed]

RIGHT IJ CENTRAL VENOUS CATHETER PLACEMENT UNDER ULTRASOUND AND
FLUOROSCOPIC GUIDANCE:

Technique and findings: The procedure, risks (including but not
limited to bleeding, infection, organ damage), benefits, and
alternatives were explained to the patient.  Questions regarding
the procedure were encouraged and answered.  The patient
understands and consents to the procedure.  Patency of the right IJ
vein was confirmed with ultrasound with image documentation. An
appropriate skin site was determined. Skin site was marked. Region
was prepped using maximum barrier technique including cap and mask,
sterile gown, sterile gloves, large sterile sheet, and
Chlorhexidine   as cutaneous antisepsis.  The region was
infiltrated locally with 1% lidocaine.   Under real-time ultrasound
guidance, the right IJ vein was accessed with a 21 gauge
micropuncture needle; the needle tip within the vein was confirmed
with ultrasound image documentation.  The  needle exchanged over a
guidewire for peel-away
sheath through which an 15 cm 6-French  PowerPICC triple lumen
catheter was
advanced. This was positioned with the tip near the SVC/RA
junction.. Spot
chest radiograph shows good positioning and no pneumothorax.
Catheter was flushed
and sutured externally with 0-Prolene sutures. Patient tolerated
the procedure
well, with no immediate complication.
IMPRESSION: 1. Technically successful right IJ triple lumen power injectable
central venous catheter placement.

## 2013-10-19 IMAGING — US IR FLUORO GUIDE CV LINE*R*
1 series · 3 of 3 positions shown · non-contrast
Comparison: none

CLINICAL DATA: GI bleed, coagulopathic, no venous access.

[Series 1: ir fluoro guide cv line*right* · 3 of 3 slices shown]
[im 1/3]
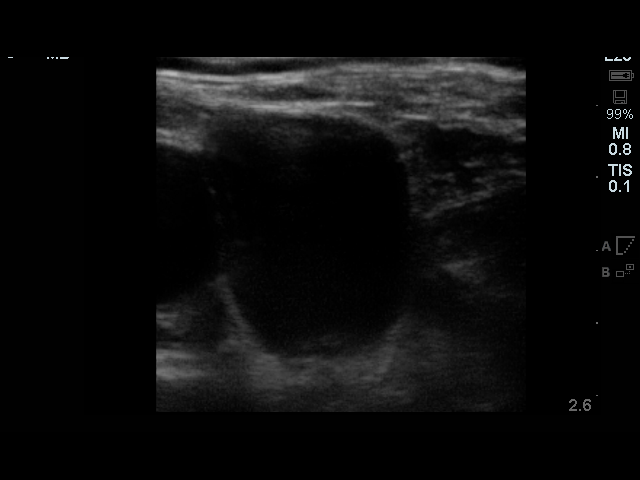
[im 2/3]
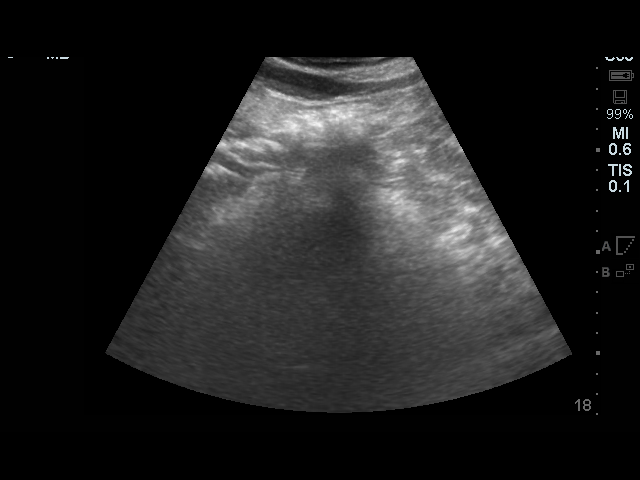
[im 3/3]
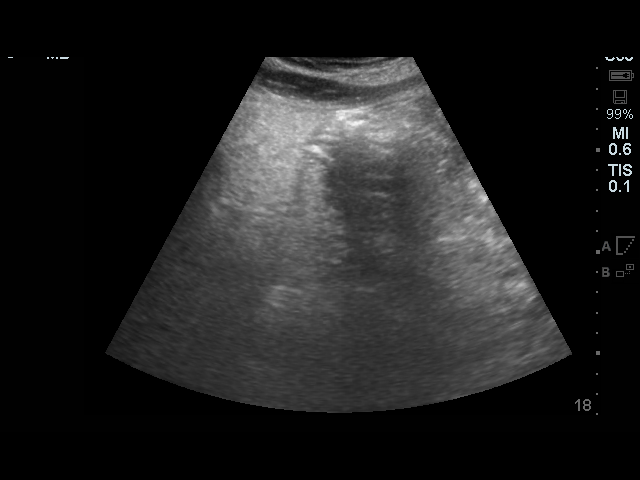

[3 of 3 positions shown; findings below may reference images not displayed]

RIGHT IJ CENTRAL VENOUS CATHETER PLACEMENT UNDER ULTRASOUND AND
FLUOROSCOPIC GUIDANCE:

Technique and findings: The procedure, risks (including but not
limited to bleeding, infection, organ damage), benefits, and
alternatives were explained to the patient.  Questions regarding
the procedure were encouraged and answered.  The patient
understands and consents to the procedure.  Patency of the right IJ
vein was confirmed with ultrasound with image documentation. An
appropriate skin site was determined. Skin site was marked. Region
was prepped using maximum barrier technique including cap and mask,
sterile gown, sterile gloves, large sterile sheet, and
Chlorhexidine   as cutaneous antisepsis.  The region was
infiltrated locally with 1% lidocaine.   Under real-time ultrasound
guidance, the right IJ vein was accessed with a 21 gauge
micropuncture needle; the needle tip within the vein was confirmed
with ultrasound image documentation.  The  needle exchanged over a
guidewire for peel-away
sheath through which an 15 cm 6-French  PowerPICC triple lumen
catheter was
advanced. This was positioned with the tip near the SVC/RA
junction.. Spot
chest radiograph shows good positioning and no pneumothorax.
Catheter was flushed
and sutured externally with 0-Prolene sutures. Patient tolerated
the procedure
well, with no immediate complication.
IMPRESSION: 1. Technically successful right IJ triple lumen power injectable
central venous catheter placement.

## 2013-10-22 ENCOUNTER — Telehealth: Payer: Self-pay | Admitting: Family Medicine

## 2013-10-22 NOTE — Telephone Encounter (Signed)
Please advise.Thank you.[name] S  

## 2013-10-22 NOTE — Telephone Encounter (Signed)
Nurse with [facility] suspects pt has UTI. Wants permission to get urine sample

## 2013-10-22 NOTE — Telephone Encounter (Signed)
Left message on GG voicemail [place].Ameria Sanjurjo, Lewie Loron

## 2013-10-22 NOTE — Telephone Encounter (Signed)
OK to give verbal order for UA

## 2013-10-24 ENCOUNTER — Other Ambulatory Visit (HOSPITAL_COMMUNITY): Payer: Self-pay | Admitting: Family Medicine

## 2013-10-27 ENCOUNTER — Other Ambulatory Visit: Payer: Self-pay | Admitting: Family Medicine

## 2013-10-27 DIAGNOSIS — N3000 Acute cystitis without hematuria: Secondary | ICD-10-CM

## 2013-10-27 DIAGNOSIS — N390 Urinary tract infection, site not specified: Secondary | ICD-10-CM | POA: Insufficient documentation

## 2013-10-27 MED ORDER — AMOXICILLIN 500 MG PO CAPS: 500.0000 mg | ORAL_CAPSULE | Freq: Three times a day (TID) | ORAL | Status: DC

## 2013-10-27 NOTE — Assessment & Plan Note (Signed)
Home nurse asked for me to order UA because of dysuria and foul smelling urine.  Results show >100,000 strep.  Will Rx with amox.  Confirmed with patient no fever and not pen allergic.

## 2013-11-04 ENCOUNTER — Ambulatory Visit: Payer: PRIVATE HEALTH INSURANCE | Admitting: Family Medicine

## 2013-11-05 ENCOUNTER — Encounter: Payer: Self-pay | Admitting: Internal Medicine

## 2013-11-05 DIAGNOSIS — I495 Sick sinus syndrome: Secondary | ICD-10-CM

## 2014-01-21 ENCOUNTER — Encounter (HOSPITAL_COMMUNITY): Payer: Self-pay | Admitting: Internal Medicine

## 2014-01-25 ENCOUNTER — Telehealth: Payer: Self-pay | Admitting: Family Medicine

## 2014-01-25 NOTE — Telephone Encounter (Signed)
It is always complicated with [name].  Called and got following history.   Transportation is more of an issue than ever.  Daughter who was bringing her now has lung cancer and is undergoing chemo. She has gained 8 lbs in the past four weeks.  Remains on torsemide 100 mg bid.  Last seen in Aug with creat =3.0  Admits to dietary indiscretion with salt.  Has appointment with me on Friday that she thinks she will be able to keep.  Encouraged to keep the appointment and work hard to eliminate sodium from diet between now and then. Fell last Thursday.  Feels pulled chest muscle.  Chest hurts when she moves arms.  Pain is quite different than previous MI.  She will discuss more with me on Friday.

## 2014-01-25 NOTE — Telephone Encounter (Signed)
[place] Presenter, broadcasting: pt told her she had gain about 8 pounds in about 4 weeks. Transportation is a problem for her right now She advised her to make an appt.

## 2014-01-29 ENCOUNTER — Encounter: Payer: Self-pay | Admitting: Family Medicine

## 2014-01-29 ENCOUNTER — Ambulatory Visit (INDEPENDENT_AMBULATORY_CARE_PROVIDER_SITE_OTHER): Payer: PRIVATE HEALTH INSURANCE | Admitting: *Deleted

## 2014-01-29 ENCOUNTER — Ambulatory Visit (INDEPENDENT_AMBULATORY_CARE_PROVIDER_SITE_OTHER): Payer: PRIVATE HEALTH INSURANCE | Admitting: Family Medicine

## 2014-01-29 VITALS — BP 104/62 | HR 61 | Temp 97.6°F | Ht 65.0 in | Wt 137.0 lb

## 2014-01-29 DIAGNOSIS — N184 Chronic kidney disease, stage 4 (severe): Secondary | ICD-10-CM

## 2014-01-29 DIAGNOSIS — I509 Heart failure, unspecified: Secondary | ICD-10-CM

## 2014-01-29 DIAGNOSIS — I50810 Right heart failure, unspecified: Secondary | ICD-10-CM

## 2014-01-29 DIAGNOSIS — Z23 Encounter for immunization: Secondary | ICD-10-CM

## 2014-01-29 DIAGNOSIS — E118 Type 2 diabetes mellitus with unspecified complications: Secondary | ICD-10-CM

## 2014-01-29 DIAGNOSIS — I5032 Chronic diastolic (congestive) heart failure: Secondary | ICD-10-CM

## 2014-01-29 LAB — BASIC METABOLIC PANEL
BUN: 44 mg/dL — ABNORMAL HIGH (ref 6–23)
CALCIUM: 8.8 mg/dL (ref 8.4–10.5)
CO2: 26 meq/L (ref 19–32)
CREATININE: 2.59 mg/dL — AB (ref 0.50–1.10)
Chloride: 101 mEq/L (ref 96–112)
Glucose, Bld: 130 mg/dL — ABNORMAL HIGH (ref 70–99)
Potassium: 4.2 mEq/L (ref 3.5–5.3)
SODIUM: 141 meq/L (ref 135–145)

## 2014-01-29 LAB — CBC
HCT: 24.9 % — ABNORMAL LOW (ref 36.0–46.0)
Hemoglobin: 8 g/dL — ABNORMAL LOW (ref 12.0–15.0)
MCH: 28.9 pg (ref 26.0–34.0)
MCHC: 32.1 g/dL (ref 30.0–36.0)
MCV: 89.9 fL (ref 78.0–100.0)
MPV: 10.3 fL (ref 9.4–12.4)
Platelets: 213 10*3/uL (ref 150–400)
RBC: 2.77 MIL/uL — AB (ref 3.87–5.11)
RDW: 15.1 % (ref 11.5–15.5)
WBC: 7.9 10*3/uL (ref 4.0–10.5)

## 2014-01-29 LAB — POCT GLYCOSYLATED HEMOGLOBIN (HGB A1C): Hemoglobin A1C: 5.2

## 2014-01-29 MED ORDER — INSULIN GLARGINE 100 UNIT/ML SOLOSTAR PEN: 5.0000 [IU] | PEN_INJECTOR | Freq: Every day | SUBCUTANEOUS | Status: AC

## 2014-01-29 MED ORDER — HYDROCHLOROTHIAZIDE 25 MG PO TABS: 25.0000 mg | ORAL_TABLET | Freq: Every day | ORAL | Status: DC

## 2014-01-29 NOTE — Assessment & Plan Note (Signed)
Add HCTZ and check labs.

## 2014-01-29 NOTE — Progress Notes (Signed)
   Subjective:    Patient ID: [name], female    DOB: 23-May-1930, 80 y.o.   MRN: [id number]  HPI Very frail medically who has done pretty well and not seen me for three months.  Comes in because home nurse documented 8 lb wt gain (9 by our scales.)  Has both bad CHF (right and left HF) and Stage 4 CKD.      Review of Systems     Objective:   Physical Exam Pulse ox noted. Lungs clear, no rales Trace peripheral edema Cardiac RRR with 2/6 SEM        Assessment & Plan:

## 2014-01-29 NOTE — Patient Instructions (Signed)
I sent in a new fluid pill to get rid of the 9 extra pounds of fluid in your body right now. I want you to be very careful about the salt/sodium in your diet. You can use [name] or No Salt I will call Monday with your lab results. Cut back your insulin to 5 units each day. Otherwise stay on all your same medications. Merry Christmas.

## 2014-01-29 NOTE — Assessment & Plan Note (Signed)
Add HCTZ??

## 2014-02-04 DIAGNOSIS — I495 Sick sinus syndrome: Secondary | ICD-10-CM

## 2014-02-06 IMAGING — CR DG CHEST 1V PORT
1 series · 1 of 1 positions shown · non-contrast
Comparison: Prior chest x-ray dated yesterday, 02/18/2012

CLINICAL DATA: Dyspnea, productive cough

PORTABLE CHEST - 1 VIEW

[AP]
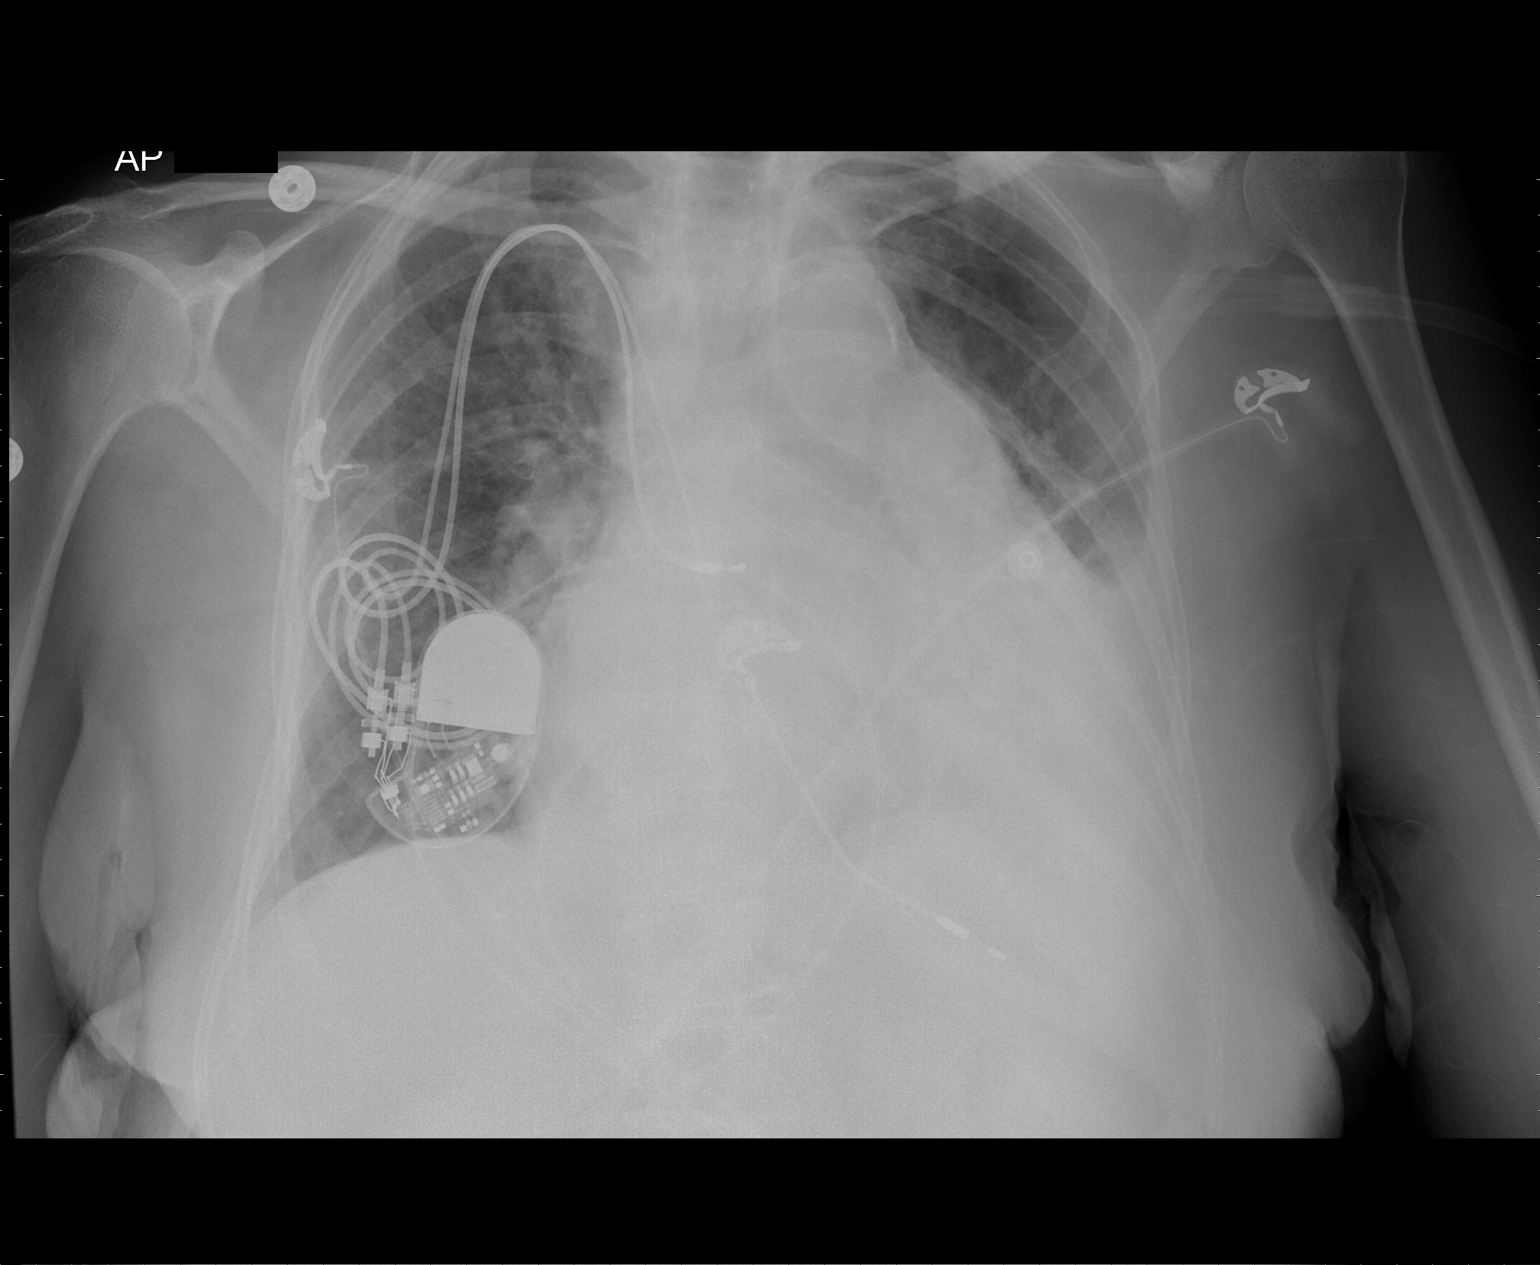

[1 of 1 positions shown; findings below may reference images not displayed]

FINDINGS: Similar appearance of the dense left basilar opacity
likely left pleural effusion.  Unchanged cardiomegaly with left
atrial enlargement.  Probable enlargement of the main pulmonary
artery.  Slightly increased pulmonary vascular congestion compared
to yesterday.  Right subclavian approach cardiac rhythm maintenance
device again noted.  No acute osseous abnormality.
IMPRESSION: 1.  Slightly increased pulmonary vascular congestion.
2.  Similar appearance of dense left basilar opacity with pleural
effusion.
3.  Stable cardiomegaly with left atrial and likely pulmonary
arterial enlargement

## 2014-02-06 IMAGING — CR DG CHEST DECUBITUS*L*
1 series · 1 of 1 positions shown · non-contrast
Comparison: The 02/19/2012

CLINICAL DATA: Left pleural effusion

CHEST - LEFT DECUBITUS

[w chest decub. *]
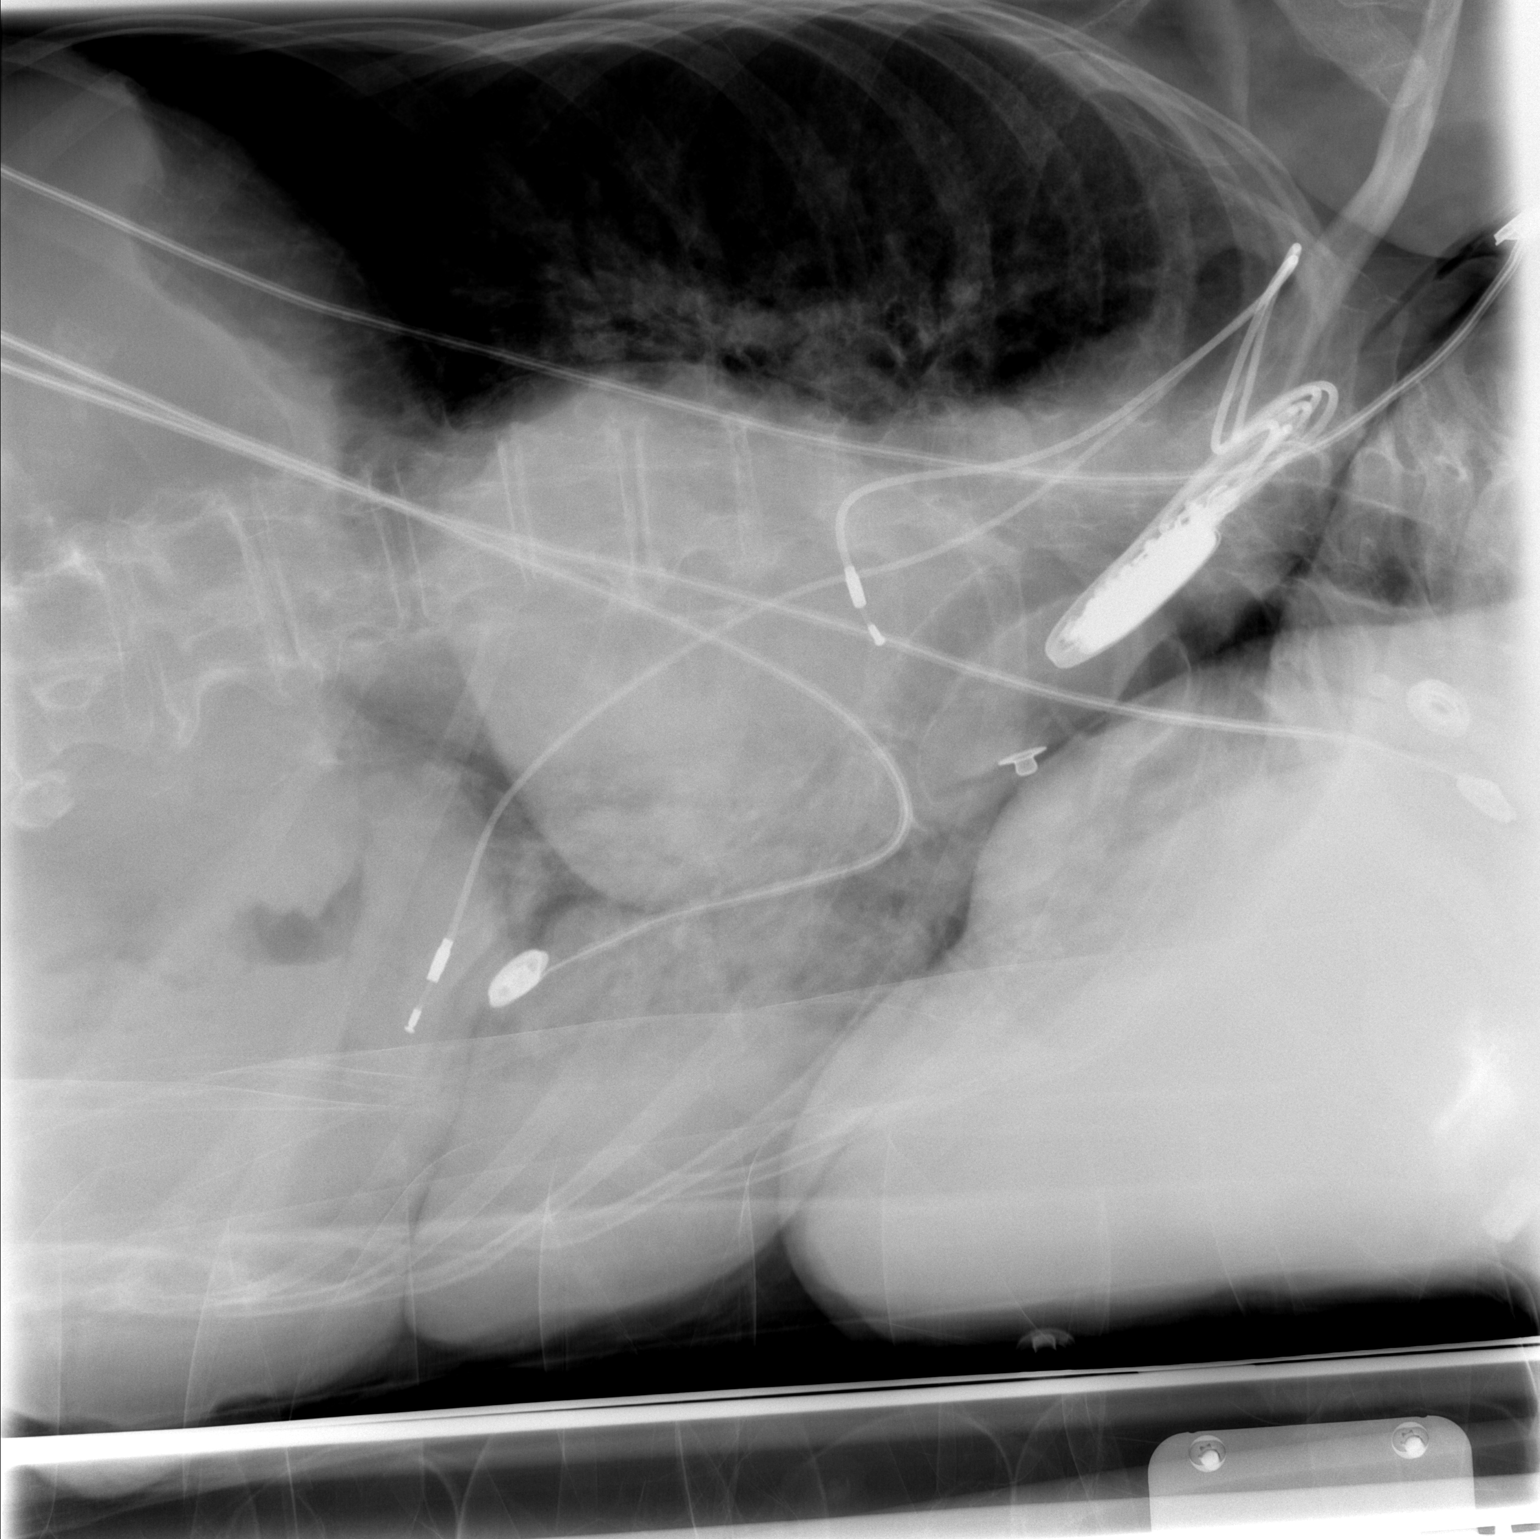

[1 of 1 positions shown; findings below may reference images not displayed]

FINDINGS: In left decubitus positioning, and there is layering
partially of pleural effusion along the dependent thorax.  With
fluid immobilization, there remains evidence of opacity in the
retrocardiac left lower lobe suggesting airspace infiltrate.
IMPRESSION: At least partially free-flowing small pleural effusion.  Left lower
lobe infiltrate also identified.

## 2014-02-09 ENCOUNTER — Telehealth: Payer: Self-pay | Admitting: Family Medicine

## 2014-02-09 IMAGING — XA IR FLUORO GUIDE CV LINE*L*
1 series · 3 of 3 positions shown · non-contrast
Comparison: none

CLINICAL HISTORY: PICC line malpositioned.

[Series 1: run · 3 of 3 slices shown]
[im 1/3]
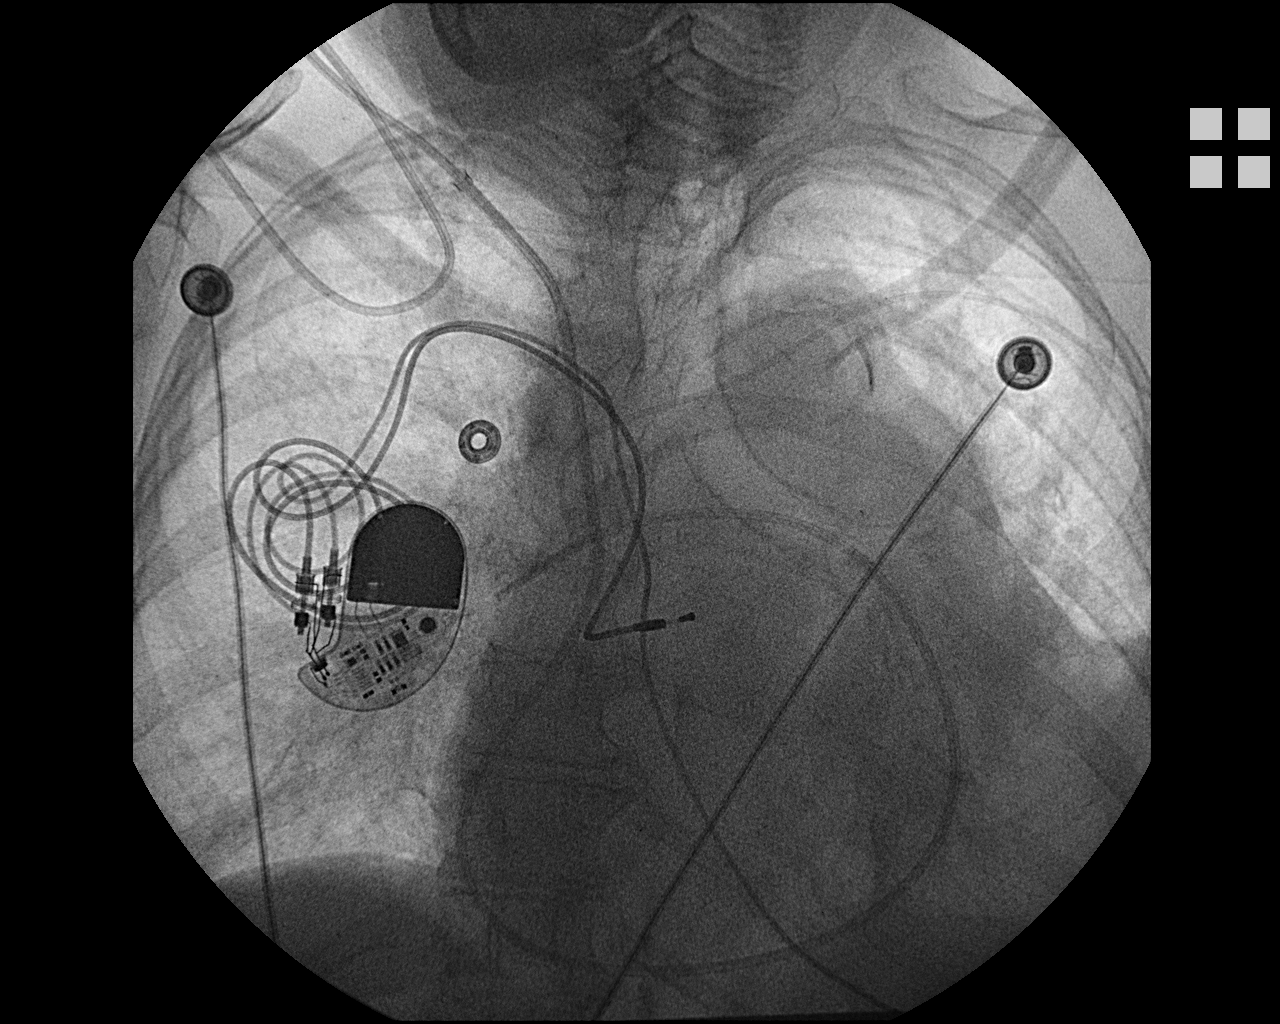
[im 2/3]
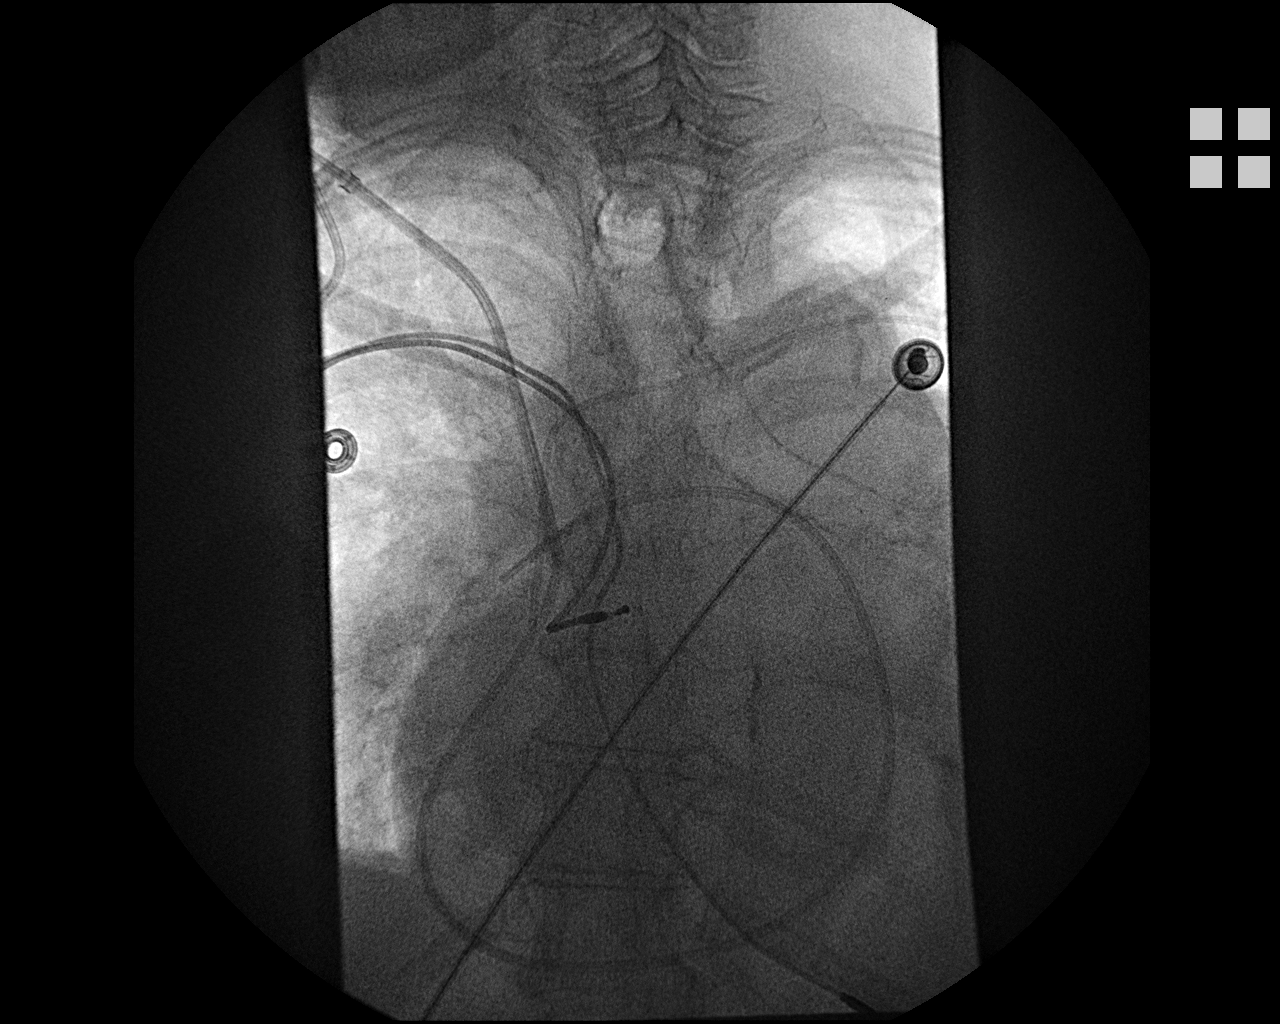
[im 3/3]
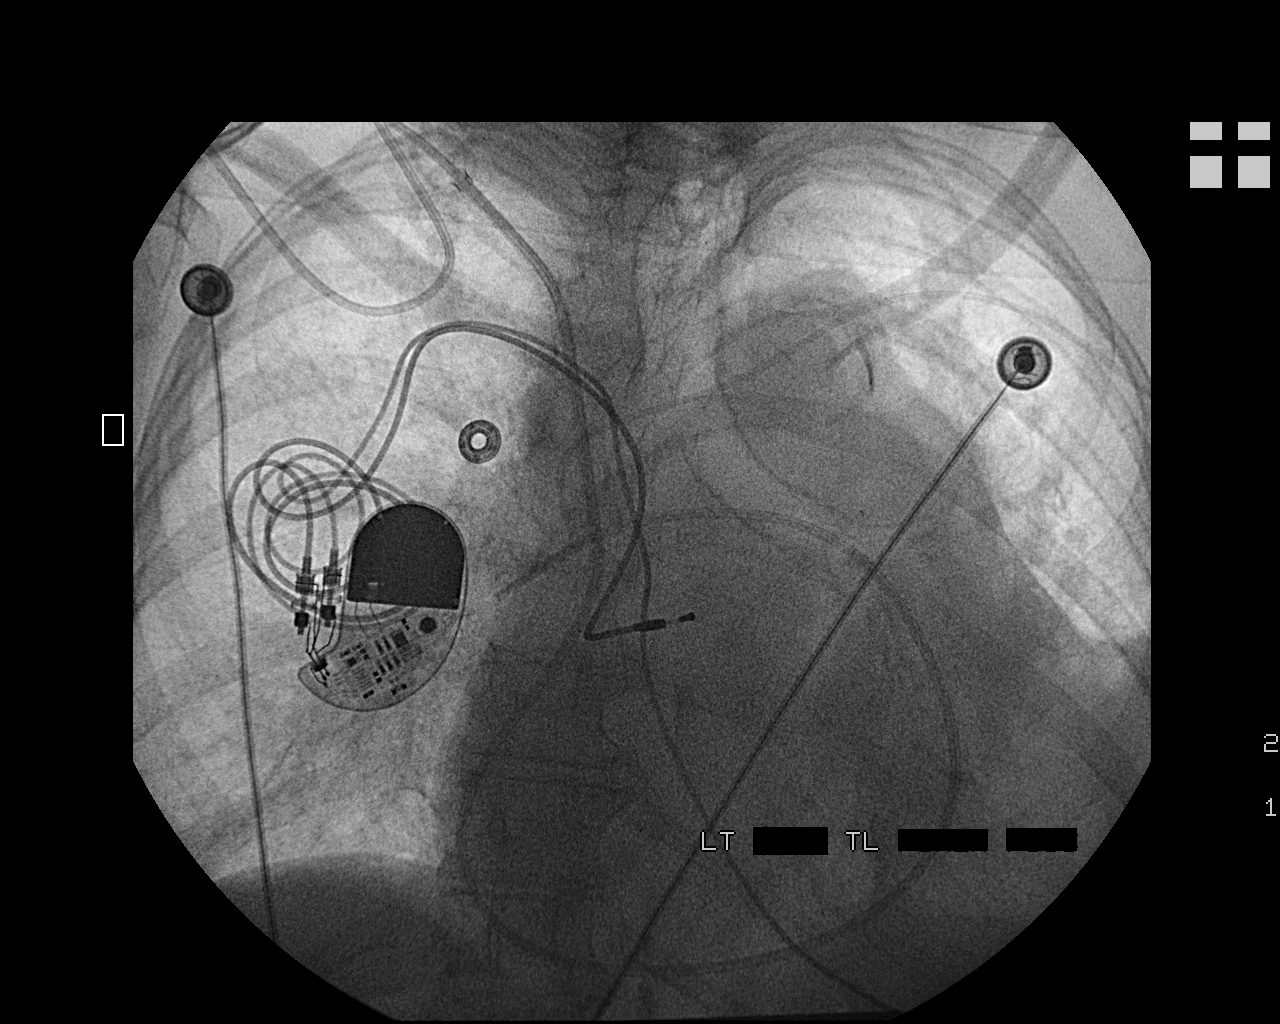

[3 of 3 positions shown; findings below may reference images not displayed]

PROCEDURE(S): EXCHANGE OF PICC LINE WITH FLUOROSCOPY

Medications:None

Moderate sedation time:None

Fluoroscopy time: 2.0 minutes

Procedure:Informed consent was obtained for a PICC line exchange.
The left arm PICC line was prepped and draped in a sterile fashion.
Maximal barrier sterile technique was utilized including caps,
mask, sterile gowns, sterile gloves, sterile drape, hand hygiene
and skin antiseptic.  Fluoroscopic image demonstrated the tip of
the catheter.  The catheter was pulled back and cut.  Catheter
removed over a 0.018 wire.  A peel-away sheath was placed.  A new
triple lumen Power PICC was cut to 37 cm and advanced into the
lower SVC.  The lumens aspirated and flushed well.  The skin was
anesthetized with lidocaine.  The PICC line was sutured to the
skin.
FINDINGS: The existing PICC line did not to cross the midline as was
thought prior to the procedure.  However, the tip of the catheter
was short and may have been within the azygos vein.  As a result,
decided to exchange the catheter.  The new catheter tip is in the
lower SVC.  The patient also a Swan-Ganz catheter in the distal
right pulmonary artery.

Complications: None
IMPRESSION: Successful exchange of the left arm PICC line.  Catheter
tip in the lower SVC.

## 2014-02-09 NOTE — Telephone Encounter (Signed)
Pt called because the pharmacy is not filling her Torsemide. She takes this twice a day and the prescription states once a day. The pharmacy will not fill this until the 51 th of January. Please call pharmacy to straighten this out. jw

## 2014-02-15 ENCOUNTER — Other Ambulatory Visit: Payer: Self-pay | Admitting: Family Medicine

## 2014-02-15 MED ORDER — TORSEMIDE 100 MG PO TABS: 100.0000 mg | ORAL_TABLET | Freq: Two times a day (BID) | ORAL | Status: DC

## 2014-02-15 NOTE — Telephone Encounter (Signed)
Pt called and would like a refill on her Torsemide. jw

## 2014-03-12 DIAGNOSIS — I2510 Atherosclerotic heart disease of native coronary artery without angina pectoris: Secondary | ICD-10-CM | POA: Diagnosis not present

## 2014-03-12 DIAGNOSIS — I5032 Chronic diastolic (congestive) heart failure: Secondary | ICD-10-CM | POA: Diagnosis not present

## 2014-04-06 ENCOUNTER — Ambulatory Visit (INDEPENDENT_AMBULATORY_CARE_PROVIDER_SITE_OTHER): Payer: Medicare Other | Admitting: Family Medicine

## 2014-04-06 ENCOUNTER — Encounter: Payer: Self-pay | Admitting: Family Medicine

## 2014-04-06 VITALS — BP 92/55 | HR 89 | Temp 98.2°F | Ht 65.0 in | Wt 128.6 lb

## 2014-04-06 DIAGNOSIS — N184 Chronic kidney disease, stage 4 (severe): Secondary | ICD-10-CM | POA: Diagnosis not present

## 2014-04-06 DIAGNOSIS — I509 Heart failure, unspecified: Secondary | ICD-10-CM | POA: Diagnosis not present

## 2014-04-06 DIAGNOSIS — I50810 Right heart failure, unspecified: Secondary | ICD-10-CM

## 2014-04-06 DIAGNOSIS — D638 Anemia in other chronic diseases classified elsewhere: Secondary | ICD-10-CM

## 2014-04-06 DIAGNOSIS — I482 Chronic atrial fibrillation, unspecified: Secondary | ICD-10-CM

## 2014-04-06 DIAGNOSIS — E118 Type 2 diabetes mellitus with unspecified complications: Secondary | ICD-10-CM

## 2014-04-06 LAB — CBC
HEMATOCRIT: 27.4 % — AB (ref 36.0–46.0)
Hemoglobin: 9.3 g/dL — ABNORMAL LOW (ref 12.0–15.0)
MCH: 30 pg (ref 26.0–34.0)
MCHC: 33.9 g/dL (ref 30.0–36.0)
MCV: 88.4 fL (ref 78.0–100.0)
MPV: 10 fL (ref 8.6–12.4)
Platelets: 179 10*3/uL (ref 150–400)
RBC: 3.1 MIL/uL — ABNORMAL LOW (ref 3.87–5.11)
RDW: 16.5 % — AB (ref 11.5–15.5)
WBC: 6.5 10*3/uL (ref 4.0–10.5)

## 2014-04-06 LAB — BASIC METABOLIC PANEL
BUN: 116 mg/dL — ABNORMAL HIGH (ref 6–23)
CO2: 31 mEq/L (ref 19–32)
Calcium: 9.3 mg/dL (ref 8.4–10.5)
Chloride: 91 mEq/L — ABNORMAL LOW (ref 96–112)
Creat: 4.05 mg/dL — ABNORMAL HIGH (ref 0.50–1.10)
Glucose, Bld: 124 mg/dL — ABNORMAL HIGH (ref 70–99)
POTASSIUM: 3.3 meq/L — AB (ref 3.5–5.3)
SODIUM: 138 meq/L (ref 135–145)

## 2014-04-06 LAB — POCT GLYCOSYLATED HEMOGLOBIN (HGB A1C): HEMOGLOBIN A1C: 6.7

## 2014-04-06 MED ORDER — DIGOXIN 125 MCG PO TABS: 0.0625 mg | ORAL_TABLET | Freq: Every day | ORAL | Status: DC

## 2014-04-06 NOTE — Progress Notes (Signed)
   Subjective:    Patient ID: [name], female    DOB: 15-May-1930, 81 y.o.   MRN: [id number]  HPI  [name] comes in for a recheck of her multiple medical problems, most serious of which is her right heart failure and pulmonary hypertension.  She is doing OK.  Has had one fall since last seen.  Appetite poor.  Issues DM: Blood sugars at home have been a bit higher.  A1C today is higher than last checked but still below 7.0 Rt. Heart failure: denies leg swelling.  SOB is at her baseline.  I am concerned with her low resting BP and her history of passing out.  A fib.  On cardiazem and coreg for rate control.  Rate is fine but I am concerned about her low BP. CKD.  Needs recheck.    Review of Systems     Objective:   Physical ExamLungs clear Cardiac irreg irreg but rate is normal Wt is near her dry weight ?~125 LBs? Trace bilateral leg edema.        Assessment & Plan:

## 2014-04-06 NOTE — Assessment & Plan Note (Signed)
Check Hgb to make sure not becoming more anemic

## 2014-04-06 NOTE — Assessment & Plan Note (Signed)
At dry weight.

## 2014-04-06 NOTE — Patient Instructions (Signed)
Things could be better, things could be worse. I am start you on one new medication that you take only a half tablet every day. After you have been on that new medicine for one week, stop your diltiazem/Cartia What I hope by making this change is that your blood pressure is a little better and that you don't get light headed or give out so easy.   I will call with blood test results.   See me in three months if things are going OK.

## 2014-04-06 NOTE — Assessment & Plan Note (Addendum)
Check labs.  Given sig rise in BUN/Creat, will decrease HCTZ to qod and recheck in one month.  At that visit, we will revisit her desires for end-of-life care and reiterate that I don't think she is a dialysis candidate. Also will need to check Mag and Phos - likely add phos low to medical regimine.   Will not increase replacement for mildly low K due to renal failure.

## 2014-04-06 NOTE — Assessment & Plan Note (Signed)
A1C at goal

## 2014-04-06 NOTE — Assessment & Plan Note (Signed)
Will switch from diltiazem to dig for rate control in an attempt to improve her blood pressure and decrease her fall risk.

## 2014-04-07 MED ORDER — HYDROCHLOROTHIAZIDE 25 MG PO TABS: 25.0000 mg | ORAL_TABLET | ORAL | Status: DC

## 2014-04-07 NOTE — Addendum Note (Signed)
Addended by: Zenia Resides on: 04/07/2014 10:54 AM   Modules accepted: Orders

## 2014-04-07 NOTE — Progress Notes (Addendum)
Patient ID: [name], female   DOB: 06-06-1930, 81 y.o.   MRN: [id number] Called with lab results.  She has good Hgb but worsening renal function.   Most recent change was to add HCTZ in Dec.  Improved her fluid status with a return to her dry weight.  But worsened renal status.   See CKD problem for changes.

## 2014-04-12 DIAGNOSIS — I2510 Atherosclerotic heart disease of native coronary artery without angina pectoris: Secondary | ICD-10-CM | POA: Diagnosis not present

## 2014-04-12 DIAGNOSIS — I5032 Chronic diastolic (congestive) heart failure: Secondary | ICD-10-CM | POA: Diagnosis not present

## 2014-04-28 ENCOUNTER — Telehealth: Payer: Self-pay | Admitting: *Deleted

## 2014-04-28 NOTE — Telephone Encounter (Signed)
Pt calls back.  She is "sick all over" and her BS is 241.  When I asked her to tell me a little more about being "sick all over" she was unable to elaborate.  Asked if she could come in for an appt in the AM.  At first she declines but then says that she will check on a ride and give [place] a call back.  She hung up before I could tell her to go to ED or call 911 if she feels she is doing worse. Maat Kafer, Salome Spotted

## 2014-04-28 NOTE — Telephone Encounter (Signed)
Pt called stating her blood sugar was 238 and she felt sick.  Pt asked if she has taken any insulin.  Pt stated she only take insulin at night.  Pt only had a crackers and 12 oz of juice today.  Pt denies any n/v; only fills weak.  Pt advised to eat something.  Tried to call pt back; but she did not answer.  Called pt's daughter to see if she could get in contact with pt.  Advised daughter that patient should come in to get seen by a provider.  She was going to call and check on patient.  Derl Barrow, RN

## 2014-04-29 NOTE — Telephone Encounter (Signed)
Spoke with pt this morning to see how she was feeling.  Pt stated she was feeling a whole lot better today. Pt has not checked glucose levels this morning. Pt advised to schedule an appt if she starts to feel bad.  Pt stated understanding.  Derl Barrow, RN

## 2014-04-29 NOTE — Telephone Encounter (Signed)
Noted and agree. 

## 2014-05-06 ENCOUNTER — Encounter: Payer: Self-pay | Admitting: Internal Medicine

## 2014-05-06 DIAGNOSIS — I495 Sick sinus syndrome: Secondary | ICD-10-CM | POA: Diagnosis not present

## 2014-05-06 DIAGNOSIS — Z950 Presence of cardiac pacemaker: Secondary | ICD-10-CM | POA: Diagnosis not present

## 2014-05-11 DIAGNOSIS — I5032 Chronic diastolic (congestive) heart failure: Secondary | ICD-10-CM | POA: Diagnosis not present

## 2014-05-11 DIAGNOSIS — I2510 Atherosclerotic heart disease of native coronary artery without angina pectoris: Secondary | ICD-10-CM | POA: Diagnosis not present

## 2014-05-24 ENCOUNTER — Ambulatory Visit (INDEPENDENT_AMBULATORY_CARE_PROVIDER_SITE_OTHER): Payer: Medicare Other | Admitting: Physician Assistant

## 2014-05-24 ENCOUNTER — Telehealth: Payer: Self-pay | Admitting: Cardiology

## 2014-05-24 ENCOUNTER — Encounter: Payer: Self-pay | Admitting: Physician Assistant

## 2014-05-24 ENCOUNTER — Other Ambulatory Visit: Payer: Self-pay | Admitting: *Deleted

## 2014-05-24 VITALS — BP 108/58 | HR 60 | Ht 65.0 in | Wt 119.0 lb

## 2014-05-24 DIAGNOSIS — N184 Chronic kidney disease, stage 4 (severe): Secondary | ICD-10-CM

## 2014-05-24 DIAGNOSIS — I482 Chronic atrial fibrillation, unspecified: Secondary | ICD-10-CM

## 2014-05-24 DIAGNOSIS — Z950 Presence of cardiac pacemaker: Secondary | ICD-10-CM

## 2014-05-24 DIAGNOSIS — E860 Dehydration: Secondary | ICD-10-CM | POA: Diagnosis not present

## 2014-05-24 DIAGNOSIS — R630 Anorexia: Secondary | ICD-10-CM | POA: Insufficient documentation

## 2014-05-24 DIAGNOSIS — I5032 Chronic diastolic (congestive) heart failure: Secondary | ICD-10-CM

## 2014-05-24 DIAGNOSIS — I10 Essential (primary) hypertension: Secondary | ICD-10-CM

## 2014-05-24 DIAGNOSIS — I5030 Unspecified diastolic (congestive) heart failure: Secondary | ICD-10-CM

## 2014-05-24 LAB — BASIC METABOLIC PANEL
BUN: 179 mg/dL (ref 6–23)
CALCIUM: 10.3 mg/dL (ref 8.4–10.5)
CO2: 28 mEq/L (ref 19–32)
Chloride: 87 mEq/L — ABNORMAL LOW (ref 96–112)
Creatinine, Ser: 5.4 mg/dL (ref 0.40–1.20)
GFR: 8.06 mL/min — AB (ref >60.00)
Glucose, Bld: 159 mg/dL — ABNORMAL HIGH (ref 70–99)
POTASSIUM: 3.9 meq/L (ref 3.5–5.1)
SODIUM: 132 meq/L — AB (ref 135–145)

## 2014-05-24 LAB — CBC WITH DIFFERENTIAL/PLATELET
Basophils Absolute: 0 10*3/uL (ref 0.0–0.1)
Basophils Relative: 0 % (ref 0.0–3.0)
Eosinophils Absolute: 0.1 10*3/uL (ref 0.0–0.7)
Eosinophils Relative: 1.1 % (ref 0.0–5.0)
HCT: 28.2 % — ABNORMAL LOW (ref 36.0–46.0)
HEMOGLOBIN: 9.7 g/dL — AB (ref 12.0–15.0)
LYMPHS PCT: 10.3 % — AB (ref 12.0–46.0)
Lymphs Abs: 1 10*3/uL (ref 0.7–4.0)
MCHC: 34.5 g/dL (ref 30.0–36.0)
MCV: 92.1 fl (ref 78.0–100.0)
MONO ABS: 0.8 10*3/uL (ref 0.1–1.0)
Monocytes Relative: 8.6 % (ref 3.0–12.0)
Neutro Abs: 7.7 10*3/uL (ref 1.4–7.7)
Neutrophils Relative %: 80 % — ABNORMAL HIGH (ref 43.0–77.0)
Platelets: 181 10*3/uL (ref 150.0–400.0)
RBC: 3.06 Mil/uL — ABNORMAL LOW (ref 3.87–5.11)
RDW: 14.1 % (ref 11.5–15.5)
WBC: 9.6 10*3/uL (ref 4.0–10.5)

## 2014-05-24 MED ORDER — TORSEMIDE 100 MG PO TABS: 50.0000 mg | ORAL_TABLET | Freq: Two times a day (BID) | ORAL | Status: DC

## 2014-05-24 NOTE — Patient Instructions (Signed)
Medication Instructions:  STOP HCTZ DECREASE TORSEMIDE ( 50 MG ) TWICE A DAY.  ONE HALF TABLET TWICE A DAY  Labwork: CBC/BMET   Follow-Up: WITH MICHELE LENZE, PA ON MARCH 18 WITH DR. Aundra Dubin CALL DR. HENSEL OFFICE TO [facility] PLACEMENT AND END OF LIFE.  Any Other Special Instructions Will Be Listed Below (If Applicable). PLEASE COME IN 15 MINTUES BEFORE OFFICE VISIT

## 2014-05-24 NOTE — Assessment & Plan Note (Signed)
Patient is not eating or drinking properly. She is on Demadex 100 mg twice a day and HCTZ every other day. She is no longer able to take care of herself during the day and is asking to be moved to a nursing home. I will decrease her Demadex to 50 mg twice a day and stop her HCTZ. We will check renal function today. I'm concerned that her kidney function will have worsened based on her current state. Recommend following up with Dr. Andria Frames to facilitate nursing home placement.

## 2014-05-24 NOTE — Assessment & Plan Note (Signed)
Blood pressure stable ? ?

## 2014-05-24 NOTE — Assessment & Plan Note (Signed)
Last creatinine in February was over 4. She is dehydrated on exam today. We will repeat blood work today.

## 2014-05-24 NOTE — Assessment & Plan Note (Signed)
Patient has lost 32 pounds since last June. She has stopped eating and drinking when she is by herself. She has no appetite and poor memory. She lives with her son but is asking to be placed in a nursing home. I asked the daughter to pursue this and look for placement as she is not taking care for self. Make appointment Dr. Andria Frames.

## 2014-05-24 NOTE — Assessment & Plan Note (Signed)
Followed by Dr. Rayann Heman. Hasn't seen since 2013. Will make an appointment.

## 2014-05-24 NOTE — Assessment & Plan Note (Signed)
No evidence of heart failure and I think she is dehydrated today. Checking renal function today. Decreasing Demadex and stopping HCTZ. I will see her back in follow-up next week.

## 2014-05-24 NOTE — Assessment & Plan Note (Signed)
Diltiazem stopped because of low blood pressure by Dr. Andria Frames. Her rate remains controlled on Coreg. Digoxin was also added by Dr. Andria Frames.

## 2014-05-24 NOTE — Progress Notes (Signed)
Cardiology Office Note   Date:  05/24/2014   ID:  [name], DOB [birth year], MRN [id number]  PCP:  Zigmund Gottron, MD  Cardiologist: [name], M.D.  Chief Complaint:    History of Present Illness: [name] is a 81 y.o. female who presents for follow-up of heart failure. She has a history of hypertrophic cardiomyopathy, diastolic heart failure, chronic atrial fibrillation not on Coumadin due to GI bleed and frequent falls, and chronic kidney disease. She was started on HCTZ back in December and her creatinine went up from 2.59-4.05 when checked by primary care. HCTZ was changed to every other day. Last 2-D echo 02/2012 EF 55-60% with focal basal septal hypertrophy, mild to moderate MR, severe biatrial enlargement, PA systolic pressure 5 4 mmHg. She had nonobstructive CAD on cath in 07/2005. She has chronic respiratory failure on home O2. She last saw Dr. Aundra Dubin in June 2015 at which time she had some fluid overload on exam and he increased her torsemide 80 mg in the morning 40 in the evening. Her diltiazem was changed to digoxin in February by Dr. Andria Frames to attempt to improve her low blood pressure.  Patient comes in today accompanied by her daughter and is feeling terrible. Her weight has dropped another 9 pounds since February and is down to 119 pounds. She weighed 151 pounds last June 2015. She lives with her son but he works all day. She has no appetite and has stopped eating and just wants to sleep all the time. She doesn't take her medications properly according to her daughter. She is very forgetful. She says she had a fall Saturday night when she tried to stand up in her wheelchair slid out from under her. She called EMS who is able to get her back in bed. Her daughter can't verify this as she hasn't talked to her brother. Patient says she wants to go live in a nursing home. She has chronic dyspnea and is wearing her oxygen all the time. She only feels good if she is sleeping.  She denies chest pain, palpitations, dizziness or presyncope.    Past Medical History  Diagnosis Date  . Macular hole of left eye     told will lose sight  . NICM (nonischemic cardiomyopathy)   . Atrial fibrillation     off anticoagulation presumed to anemia  . Coronary atherosclerosis     and old MI and hx of CVA   . Dysphagia   . DM (diabetes mellitus)     nueropathy  . HTN (hypertension)   . HLD (hyperlipidemia)   . Incontinence   . CHF (congestive heart failure)     related to dyastolic dysfunction  . CAD (coronary artery disease)     nonobstructive  . Scoliosis   . Repeated falls     hx  . Stasis dermatitis   . Hearing loss   . Anemia   . CHF (congestive heart failure)     cardiacc ath EF 25-30%  . Permanent atrial fibrillation   . Symptomatic bradycardia     sp PPM 1997  . Pacemaker     Dr. Olevia Perches; [employer] VVI  . Heart murmur   . Anginal pain   . Myocardial infarction     "I've had 2" (09/02/2013)  . Pneumonia ~ 2003 X 1  . Neuropathy, diabetic   . History of blood transfusion     "I've had ~ 8 in my life; related to having children"  .  Stroke     "I've had 2" (09/02/2013)  . Osteoarthritis     "left foot" (09/02/2013)  . Gout   . Depression     "was taking RX; not depressed now" (09/02/2013)  . CRI (chronic renal insufficiency)     cr basleine 1.5-1.6  . Chronic renal failure, stage 4 (severe)     [name] 09/02/2013  . Melanoma     "inner right leg; left knee; left ankle"    Past Surgical History  Procedure Laterality Date  . Ankle fracture surgery Left   . Bladder suspension      "tucked up"  . Melanoma excision  X 3    "inner right leg; left knee; left ankle"  . Appendectomy    . Esophagogastroduodenoscopy  11/02/2011    Procedure: ESOPHAGOGASTRODUODENOSCOPY (EGD);  Surgeon: Irene Shipper, MD;  Location: [facility] ENDOSCOPY;  Service: Endoscopy;  Laterality: N/A;  . Insert / replace / remove pacemaker  09/27/1995, 04/27/2005    SJM pacemaker implanted by  Dr Olevia Perches, generator change 04/27/05  . Tonsillectomy    . Total abdominal hysterectomy    . Tubal ligation    . Cataract extraction w/ intraocular lens  implant, bilateral Bilateral   . Pars plana vitrectomy w/ repair of macular hole Left   . Cardiac catheterization    . Right heart catheterization N/A 02/21/2012    Procedure: RIGHT HEART CATH;  Surgeon: Jolaine Artist, MD;  Location: [facility] CATH LAB;  Service: Cardiovascular;  Laterality: N/A;     Current Outpatient Prescriptions  Medication Sig Dispense Refill  . atorvastatin (LIPITOR) 10 MG tablet Take 10 mg by mouth every morning.    . carvedilol (COREG) 12.5 MG tablet TAKE ONE TABLET BY MOUTH TWICE DAILY WITH MEALS 180 tablet 3  . digoxin (LANOXIN) 0.125 MG tablet Take 0.5 tablets (0.0625 mg total) by mouth daily. 45 tablet 3  . DULoxetine (CYMBALTA) 30 MG capsule Take 1 capsule (30 mg total) by mouth daily. 90 capsule 3  . ferrous sulfate 325 (65 FE) MG tablet Take 325 mg by mouth 2 (two) times daily with a meal.    . hydrochlorothiazide (HYDRODIURIL) 25 MG tablet Take 1 tablet (25 mg total) by mouth every other day. 90 tablet 3  . Insulin Glargine (LANTUS SOLOSTAR) 100 UNIT/ML Solostar Pen Inject 5 Units into the skin daily at 10 pm. 15 mL   . pantoprazole (PROTONIX) 40 MG tablet Take 1 tablet (40 mg total) by mouth daily at 6 (six) AM. 90 tablet 3  . polyethylene glycol (MIRALAX / GLYCOLAX) packet Take 17 g by mouth daily as needed for moderate constipation.    . potassium chloride (K-DUR) 10 MEQ tablet Take 1 tablet (10 mEq total) by mouth 2 (two) times daily. 180 tablet 3  . torsemide (DEMADEX) 100 MG tablet Take 1 tablet (100 mg total) by mouth 2 (two) times daily. 60 tablet 12   No current facility-administered medications for this visit.    Allergies:   Paroxetine; Codeine phosphate; Hydrocodone; and Tramadol hcl    Social History:  The patient  reports that she has never smoked. She has never used smokeless tobacco. She  reports that she does not drink alcohol or use illicit drugs.   Family History:  The patient's family history includes COPD in her mother; Colon cancer in an other family member; Diabetes in an other family member; Heart attack in an other family member; Stroke in an other family member.    ROS:  Please see the history of present illness.   Otherwise, review of systems are positive for none.   All other systems are reviewed and negative.    PHYSICAL EXAM: BP 108/58 mmHg  Pulse 60  Ht 5\' 5"  (1.651 m)  Wt 119 lb (53.978 kg)  BMI 19.80 kg/m2 GEN: Elderly, cachectic, frail, in no acute distress Neck: no JVD, HJR, carotid bruits, or masses Cardiac: Irregular rate and rhythm with 1/6 systolic murmur at the left sternal border, no rubs, thrill or heave,  Respiratory:   decreased breath sounds throughout but clear to auscultation bilaterally,  GI: soft, nontender, nondistended, + BS MS: no deformity or atrophy Extremities: without cyanosis, clubbing, edema, good distal pulses bilaterally.  Skin:  tenting of her skin ,warm and dry, no rash Neuro:  Strength and sensation are intact    EKG:  EKG is ordered today. The ekg ordered today demonstrates paced rhythm at 60 bpm   Recent Labs: 09/02/2013: ALT 14; Pro B Natriuretic peptide (BNP) 34272.0*; TSH 3.780 10/05/2013: Magnesium 1.9 04/06/2014: BUN 116*; Creatinine 4.05*; Hemoglobin 9.3*; Platelets 179; Potassium 3.3*; Sodium 138    Lipid Panel    Component Value Date/Time   CHOL 97 08/20/2013 1140   TRIG 78 08/20/2013 1140   HDL 30* 08/20/2013 1140   CHOLHDL 3.2 08/20/2013 1140   VLDL 16 08/20/2013 1140   LDLCALC 51 08/20/2013 1140   LDLDIRECT 79 03/09/2010 1933      Wt Readings from Last 3 Encounters:  04/06/14 128 lb 9.6 oz (58.333 kg)  01/29/14 137 lb (62.143 kg)  10/08/13 128 lb (58.06 kg)      Other studies Reviewed: Additional studies/ records that were reviewed today include and review of the records demonstrates:   Procedures performed:  1) Right heart catheterization  Description of procedure:   The risks and indication of the procedure were explained. Consent was signed and placed on the chart. An appropriate timeout was taken prior to the procedure. The right neck was prepped and draped in the routine sterile fashion and anesthetized with 1% local lidocaine.   A 7 FR venous sheath was placed in the right internal jugular vein using a modified Seldinger technique. A standard Swan-Ganz catheter was used for the procedure.   Complications: None apparent.  Findings:  Done on dopamine 70mcg/kg/min  RA = 15 RV = 52/12/15 PA = 54/27 (37) PCW = 20 Fick cardiac output/index = 4.5/2.4 Thermo CO/CI =  3.2/1.7 PVR = 5.4 Woods (Thermo) PA sat = 30%, 32% FA sat = 89% on 4L O2  Assessment & Plan:  Hemodynamics most consistent with RV failure (and mild pHTN). Given low PA sat suspect Thermodilution CO more accurate than Fick calculation. Increase dopamine to 5 mcg/kg/min (run through [place]). Resume lasix gtt at 10 mg/hr. Follow renal function closely.   Daniel Bensimhon 1:40 PM         2-D echo January 2014  Procedures performed:  1) Right heart catheterization  Description of procedure:   The risks and indication of the procedure were explained. Consent was signed and placed on the chart. An appropriate timeout was taken prior to the procedure. The right neck was prepped and draped in the routine sterile fashion and anesthetized with 1% local lidocaine.   A 7 FR venous sheath was placed in the right internal jugular vein using a modified Seldinger technique. A standard Swan-Ganz catheter was used for the procedure.   Complications: None apparent.  Findings:  Done on dopamine 32mcg/kg/min  RA = 15 RV = 52/12/15 PA = 54/27 (37) PCW = 20 Fick cardiac output/index = 4.5/2.4 Thermo CO/CI =  3.2/1.7 PVR = 5.4 Woods (Thermo) PA sat = 30%, 32% FA sat = 89% on 4L O2  Assessment &  Plan:  Hemodynamics most consistent with RV failure (and mild pHTN). Given low PA sat suspect Thermodilution CO more accurate than Fick calculation. Increase dopamine to 5 mcg/kg/min (run through [place]). Resume lasix gtt at 10 mg/hr. Follow renal function closely.   Daniel Bensimhon 1:40 PM            Study Conclusions  - Left ventricle: There was focal basal hypertrophy.   Systolic function was normal. The estimated ejection   fraction was in the range of 55% to 60%. - Mitral valve: Mild to moderate regurgitation. - Left atrium: The atrium was severely dilated. - Right atrium: The atrium was severely dilated. - Tricuspid valve: Moderate regurgitation. - Pulmonary arteries: Systolic pressure was moderately   increased. PA peak pressure: 54mm Hg (S).   ASSESSMENT AND PLAN: Dehydration Patient is not eating or drinking properly. She is on Demadex 100 mg twice a day and HCTZ every other day. She is no longer able to take care of herself during the day and is asking to be moved to a nursing home. I will decrease her Demadex to 50 mg twice a day and stop her HCTZ. We will check renal function today. I'm concerned that her kidney function will have worsened based on her current state. Recommend following up with Dr. Andria Frames to facilitate nursing home placement.   Anorexia Patient has lost 32 pounds since last June. She has stopped eating and drinking when she is by herself. She has no appetite and poor memory. She lives with her son but is asking to be placed in a nursing home. I asked the daughter to pursue this and look for placement as she is not taking care for self. Make appointment Dr. Andria Frames.   Atrial fibrillation Diltiazem stopped because of low blood pressure by Dr. Andria Frames. Her rate remains controlled on Coreg. Digoxin was also added by Dr. Andria Frames.   Chronic diastolic congestive heart failure No evidence of heart failure and I think she is dehydrated today. Checking renal  function today. Decreasing Demadex and stopping HCTZ. I will see her back in follow-up next week.   HYPERTENSION, BENIGN SYSTEMIC Blood pressure stable   Chronic kidney disease (CKD), stage IV (severe) Last creatinine in February was over 4. She is dehydrated on exam today. We will repeat blood work today.   PACEMAKER-[employer] Followed by Dr. Rayann Heman. Hasn't seen since 2013. Will make an appointment.     Sumner Boast, PA-C  05/24/2014 12:18 PM    [facility] [place], [place], [place]  [address] Phone: [phone]; Fax: [phone]

## 2014-05-24 NOTE — Telephone Encounter (Signed)
Notes Recorded by Emily Filbert, RN on 05/24/2014 at 4:58 PM Critical BUN/ creatinine called from the lab. BUN- 179/ creatinine- 5.4 She has known kidney disease.  Seen by Estella Husk, PA today. Demadex was decreased to 50 mg BID HCTZ stopped. Reviewed results with Dr. Harrington Challenger (DOD). Orders received to hold torsemide until Wednesday and then repeat a BMP/ BNP at that time. I have reviewed this with the patient and her daughter. I have advised that torsemide be held until lab results are back on Wednesday.  Will forward this message to triage to confirm the patient has her labs repeated on Wednesday. She will hold torsemide until results are back.

## 2014-05-26 ENCOUNTER — Other Ambulatory Visit (INDEPENDENT_AMBULATORY_CARE_PROVIDER_SITE_OTHER): Payer: Medicare Other | Admitting: *Deleted

## 2014-05-26 DIAGNOSIS — I5030 Unspecified diastolic (congestive) heart failure: Secondary | ICD-10-CM

## 2014-05-26 DIAGNOSIS — N184 Chronic kidney disease, stage 4 (severe): Secondary | ICD-10-CM

## 2014-05-26 LAB — BASIC METABOLIC PANEL
BUN: 182 mg/dL (ref 6–23)
CO2: 25 mEq/L (ref 19–32)
CREATININE: 5.95 mg/dL — AB (ref 0.40–1.20)
Calcium: 9.8 mg/dL (ref 8.4–10.5)
Chloride: 88 mEq/L — ABNORMAL LOW (ref 96–112)
GFR: 7.21 mL/min — AB (ref >60.00)
Glucose, Bld: 279 mg/dL — ABNORMAL HIGH (ref 70–99)
Potassium: 4.1 mEq/L (ref 3.5–5.1)
Sodium: 133 mEq/L — ABNORMAL LOW (ref 135–145)

## 2014-05-26 LAB — BRAIN NATRIURETIC PEPTIDE: Pro B Natriuretic peptide (BNP): 639 pg/mL — ABNORMAL HIGH (ref 0.0–100.0)

## 2014-05-26 NOTE — Telephone Encounter (Signed)
Notes Recorded by Loren Racer, LPN on 08/10/2415 at 5:30 PM Received call from lab in regards to critical results. Took results to Dr. Meda Coffee, [place], and received orders to have pt to continue to hold her Torsemide and follow up with APP next week. Called and pt gave permission to speak to daughter. Informed daughter to have pt to continue to hold her Torsemide and to keep her appt with Estella Husk on Monday.  Daughter verbalized understanding and was in agreement with this plan.

## 2014-05-31 ENCOUNTER — Encounter (HOSPITAL_COMMUNITY): Payer: Self-pay | Admitting: Cardiovascular Disease

## 2014-05-31 ENCOUNTER — Ambulatory Visit (INDEPENDENT_AMBULATORY_CARE_PROVIDER_SITE_OTHER): Payer: Medicare Other | Admitting: Physician Assistant

## 2014-05-31 ENCOUNTER — Encounter: Payer: Self-pay | Admitting: Physician Assistant

## 2014-05-31 ENCOUNTER — Inpatient Hospital Stay (HOSPITAL_COMMUNITY)
Admission: AD | Admit: 2014-05-31 | Discharge: 2014-06-02 | DRG: 682 | Disposition: A | Discharge status: Skilled Nursing Facility | Payer: Medicare Other | Source: Ambulatory Visit | Attending: Family Medicine | Admitting: Family Medicine

## 2014-05-31 ENCOUNTER — Other Ambulatory Visit: Payer: Self-pay | Admitting: Physician Assistant

## 2014-05-31 ENCOUNTER — Ambulatory Visit (INDEPENDENT_AMBULATORY_CARE_PROVIDER_SITE_OTHER): Payer: Medicare Other | Admitting: *Deleted

## 2014-05-31 VITALS — BP 82/42 | HR 60 | Ht 65.0 in | Wt 119.0 lb

## 2014-05-31 DIAGNOSIS — I252 Old myocardial infarction: Secondary | ICD-10-CM

## 2014-05-31 DIAGNOSIS — Z9981 Dependence on supplemental oxygen: Secondary | ICD-10-CM | POA: Diagnosis not present

## 2014-05-31 DIAGNOSIS — Z886 Allergy status to analgesic agent status: Secondary | ICD-10-CM

## 2014-05-31 DIAGNOSIS — E860 Dehydration: Secondary | ICD-10-CM | POA: Diagnosis not present

## 2014-05-31 DIAGNOSIS — D638 Anemia in other chronic diseases classified elsewhere: Secondary | ICD-10-CM | POA: Diagnosis present

## 2014-05-31 DIAGNOSIS — T460X1A Poisoning by cardiac-stimulant glycosides and drugs of similar action, accidental (unintentional), initial encounter: Secondary | ICD-10-CM

## 2014-05-31 DIAGNOSIS — Z515 Encounter for palliative care: Secondary | ICD-10-CM | POA: Diagnosis not present

## 2014-05-31 DIAGNOSIS — K219 Gastro-esophageal reflux disease without esophagitis: Secondary | ICD-10-CM | POA: Diagnosis present

## 2014-05-31 DIAGNOSIS — Z66 Do not resuscitate: Secondary | ICD-10-CM | POA: Diagnosis present

## 2014-05-31 DIAGNOSIS — N179 Acute kidney failure, unspecified: Principal | ICD-10-CM | POA: Diagnosis present

## 2014-05-31 DIAGNOSIS — I129 Hypertensive chronic kidney disease with stage 1 through stage 4 chronic kidney disease, or unspecified chronic kidney disease: Secondary | ICD-10-CM | POA: Diagnosis present

## 2014-05-31 DIAGNOSIS — E861 Hypovolemia: Secondary | ICD-10-CM | POA: Diagnosis not present

## 2014-05-31 DIAGNOSIS — L299 Pruritus, unspecified: Secondary | ICD-10-CM | POA: Diagnosis not present

## 2014-05-31 DIAGNOSIS — E875 Hyperkalemia: Secondary | ICD-10-CM | POA: Diagnosis not present

## 2014-05-31 DIAGNOSIS — R630 Anorexia: Secondary | ICD-10-CM | POA: Diagnosis not present

## 2014-05-31 DIAGNOSIS — L298 Other pruritus: Secondary | ICD-10-CM | POA: Diagnosis not present

## 2014-05-31 DIAGNOSIS — R627 Adult failure to thrive: Secondary | ICD-10-CM | POA: Diagnosis present

## 2014-05-31 DIAGNOSIS — M1990 Unspecified osteoarthritis, unspecified site: Secondary | ICD-10-CM | POA: Diagnosis present

## 2014-05-31 DIAGNOSIS — T460X1D Poisoning by cardiac-stimulant glycosides and drugs of similar action, accidental (unintentional), subsequent encounter: Secondary | ICD-10-CM | POA: Diagnosis not present

## 2014-05-31 DIAGNOSIS — J9610 Chronic respiratory failure, unspecified whether with hypoxia or hypercapnia: Secondary | ICD-10-CM | POA: Diagnosis not present

## 2014-05-31 DIAGNOSIS — G9340 Encephalopathy, unspecified: Secondary | ICD-10-CM | POA: Diagnosis present

## 2014-05-31 DIAGNOSIS — Z888 Allergy status to other drugs, medicaments and biological substances status: Secondary | ICD-10-CM

## 2014-05-31 DIAGNOSIS — Z681 Body mass index (BMI) 19 or less, adult: Secondary | ICD-10-CM

## 2014-05-31 DIAGNOSIS — T460X5A Adverse effect of cardiac-stimulant glycosides and drugs of similar action, initial encounter: Secondary | ICD-10-CM | POA: Diagnosis not present

## 2014-05-31 DIAGNOSIS — R634 Abnormal weight loss: Secondary | ICD-10-CM | POA: Diagnosis not present

## 2014-05-31 DIAGNOSIS — Z9841 Cataract extraction status, right eye: Secondary | ICD-10-CM

## 2014-05-31 DIAGNOSIS — R300 Dysuria: Secondary | ICD-10-CM | POA: Diagnosis present

## 2014-05-31 DIAGNOSIS — I5032 Chronic diastolic (congestive) heart failure: Secondary | ICD-10-CM

## 2014-05-31 DIAGNOSIS — F329 Major depressive disorder, single episode, unspecified: Secondary | ICD-10-CM | POA: Diagnosis present

## 2014-05-31 DIAGNOSIS — Z961 Presence of intraocular lens: Secondary | ICD-10-CM | POA: Diagnosis present

## 2014-05-31 DIAGNOSIS — I4891 Unspecified atrial fibrillation: Secondary | ICD-10-CM

## 2014-05-31 DIAGNOSIS — H9190 Unspecified hearing loss, unspecified ear: Secondary | ICD-10-CM | POA: Diagnosis present

## 2014-05-31 DIAGNOSIS — I421 Obstructive hypertrophic cardiomyopathy: Secondary | ICD-10-CM | POA: Diagnosis not present

## 2014-05-31 DIAGNOSIS — N184 Chronic kidney disease, stage 4 (severe): Secondary | ICD-10-CM

## 2014-05-31 DIAGNOSIS — I482 Chronic atrial fibrillation: Secondary | ICD-10-CM | POA: Diagnosis not present

## 2014-05-31 DIAGNOSIS — Z9842 Cataract extraction status, left eye: Secondary | ICD-10-CM

## 2014-05-31 DIAGNOSIS — E878 Other disorders of electrolyte and fluid balance, not elsewhere classified: Secondary | ICD-10-CM | POA: Diagnosis not present

## 2014-05-31 DIAGNOSIS — Z950 Presence of cardiac pacemaker: Secondary | ICD-10-CM

## 2014-05-31 DIAGNOSIS — M109 Gout, unspecified: Secondary | ICD-10-CM | POA: Diagnosis present

## 2014-05-31 DIAGNOSIS — N19 Unspecified kidney failure: Secondary | ICD-10-CM | POA: Insufficient documentation

## 2014-05-31 DIAGNOSIS — E1142 Type 2 diabetes mellitus with diabetic polyneuropathy: Secondary | ICD-10-CM | POA: Diagnosis not present

## 2014-05-31 DIAGNOSIS — Z8673 Personal history of transient ischemic attack (TIA), and cerebral infarction without residual deficits: Secondary | ICD-10-CM

## 2014-05-31 DIAGNOSIS — Z90710 Acquired absence of both cervix and uterus: Secondary | ICD-10-CM

## 2014-05-31 DIAGNOSIS — I872 Venous insufficiency (chronic) (peripheral): Secondary | ICD-10-CM | POA: Diagnosis present

## 2014-05-31 DIAGNOSIS — E780 Pure hypercholesterolemia: Secondary | ICD-10-CM | POA: Diagnosis present

## 2014-05-31 DIAGNOSIS — E781 Pure hyperglyceridemia: Secondary | ICD-10-CM | POA: Diagnosis present

## 2014-05-31 DIAGNOSIS — I25119 Atherosclerotic heart disease of native coronary artery with unspecified angina pectoris: Secondary | ICD-10-CM | POA: Diagnosis present

## 2014-05-31 DIAGNOSIS — Z85820 Personal history of malignant melanoma of skin: Secondary | ICD-10-CM

## 2014-05-31 HISTORY — DX: Acute kidney failure, unspecified: N17.9

## 2014-05-31 LAB — CBC WITH DIFFERENTIAL/PLATELET
Basophils Absolute: 0 10*3/uL (ref 0.0–0.1)
Basophils Relative: 0 % (ref 0–1)
Eosinophils Absolute: 0.1 10*3/uL (ref 0.0–0.7)
Eosinophils Relative: 2 % (ref 0–5)
HCT: 26.8 % — ABNORMAL LOW (ref 36.0–46.0)
HEMOGLOBIN: 9 g/dL — AB (ref 12.0–15.0)
Lymphocytes Relative: 9 % — ABNORMAL LOW (ref 12–46)
Lymphs Abs: 0.6 10*3/uL — ABNORMAL LOW (ref 0.7–4.0)
MCH: 31.1 pg (ref 26.0–34.0)
MCHC: 33.6 g/dL (ref 30.0–36.0)
MCV: 92.7 fL (ref 78.0–100.0)
Monocytes Absolute: 0.7 10*3/uL (ref 0.1–1.0)
Monocytes Relative: 10 % (ref 3–12)
NEUTROS ABS: 5.7 10*3/uL (ref 1.7–7.7)
Neutrophils Relative %: 79 % — ABNORMAL HIGH (ref 43–77)
PLATELETS: 130 10*3/uL — AB (ref 150–400)
RBC: 2.89 MIL/uL — ABNORMAL LOW (ref 3.87–5.11)
RDW: 14.5 % (ref 11.5–15.5)
WBC: 7.1 10*3/uL (ref 4.0–10.5)

## 2014-05-31 LAB — APTT: APTT: 28 s (ref 24–37)

## 2014-05-31 LAB — COMPREHENSIVE METABOLIC PANEL
ALBUMIN: 3.4 g/dL — AB (ref 3.5–5.2)
ALK PHOS: 71 U/L (ref 39–117)
ALT: 16 U/L (ref 0–35)
ANION GAP: 17 — AB (ref 5–15)
AST: 20 U/L (ref 0–37)
BILIRUBIN TOTAL: 0.9 mg/dL (ref 0.3–1.2)
BUN: 174 mg/dL — ABNORMAL HIGH (ref 6–23)
CHLORIDE: 95 mmol/L — AB (ref 96–112)
CO2: 25 mmol/L (ref 19–32)
Calcium: 9.8 mg/dL (ref 8.4–10.5)
Creatinine, Ser: 5.72 mg/dL — ABNORMAL HIGH (ref 0.50–1.10)
GFR calc Af Amer: 7 mL/min — ABNORMAL LOW (ref >90)
GFR calc non Af Amer: 6 mL/min — ABNORMAL LOW (ref >90)
Glucose, Bld: 138 mg/dL — ABNORMAL HIGH (ref 70–99)
POTASSIUM: 5.7 mmol/L — AB (ref 3.5–5.1)
Sodium: 137 mmol/L (ref 135–145)
Total Protein: 7.4 g/dL (ref 6.0–8.3)

## 2014-05-31 LAB — MAGNESIUM: Magnesium: 2.1 mg/dL (ref 1.5–2.5)

## 2014-05-31 LAB — PROTIME-INR
INR: 1.18 (ref 0.00–1.49)
Prothrombin Time: 15.2 seconds (ref 11.6–15.2)

## 2014-05-31 LAB — GLUCOSE, CAPILLARY
GLUCOSE-CAPILLARY: 129 mg/dL — AB (ref 70–99)
Glucose-Capillary: 136 mg/dL — ABNORMAL HIGH (ref 70–99)
Glucose-Capillary: 169 mg/dL — ABNORMAL HIGH (ref 70–99)

## 2014-05-31 LAB — TSH: TSH: 1.406 u[IU]/mL (ref 0.350–4.500)

## 2014-05-31 LAB — BRAIN NATRIURETIC PEPTIDE: B NATRIURETIC PEPTIDE 5: 502.6 pg/mL — AB (ref 0.0–100.0)

## 2014-05-31 LAB — DIGOXIN LEVEL: Digoxin Level: 4.2 ng/mL (ref 0.8–2.0)

## 2014-05-31 MED ORDER — SODIUM CHLORIDE 0.9 % IV SOLN
2.0000 | INTRAVENOUS | Status: AC
  Administered 2014-05-31: 2 via INTRAVENOUS
  Filled 2014-05-31: qty 80

## 2014-05-31 MED ORDER — DILTIAZEM HCL ER COATED BEADS 240 MG PO CP24: 240.0000 mg | ORAL_CAPSULE | Freq: Every day | ORAL | Status: DC

## 2014-05-31 MED ORDER — DEXTROSE-NACL 5-0.9 % IV SOLN: INTRAVENOUS | Status: DC

## 2014-05-31 MED ORDER — POLYETHYLENE GLYCOL 3350 17 G PO PACK
17.0000 g | PACK | Freq: Every day | ORAL | Status: DC | PRN
  Filled 2014-05-31: qty 1

## 2014-05-31 MED ORDER — CARVEDILOL 25 MG PO TABS
25.0000 mg | ORAL_TABLET | Freq: Two times a day (BID) | ORAL | Status: DC
  Administered 2014-05-31 – 2014-06-02 (×5): 25 mg via ORAL
  Filled 2014-05-31 (×6): qty 1

## 2014-05-31 MED ORDER — ATORVASTATIN CALCIUM 10 MG PO TABS
10.0000 mg | ORAL_TABLET | Freq: Every day | ORAL | Status: DC
  Administered 2014-05-31 – 2014-06-02 (×3): 10 mg via ORAL
  Filled 2014-05-31 (×3): qty 1

## 2014-05-31 MED ORDER — SODIUM CHLORIDE 0.9 % IV SOLN: INTRAVENOUS | Status: DC

## 2014-05-31 MED ORDER — CETYLPYRIDINIUM CHLORIDE 0.05 % MT LIQD
7.0000 mL | Freq: Two times a day (BID) | OROMUCOSAL | Status: DC
  Administered 2014-05-31 – 2014-06-02 (×4): 7 mL via OROMUCOSAL

## 2014-05-31 MED ORDER — SODIUM CHLORIDE 0.9 % IV SOLN: 2.0000 | Freq: Once | INTRAVENOUS | Status: DC

## 2014-05-31 MED ORDER — INSULIN GLARGINE 100 UNIT/ML ~~LOC~~ SOLN
5.0000 [IU] | Freq: Every day | SUBCUTANEOUS | Status: DC
  Administered 2014-05-31 – 2014-06-01 (×2): 5 [IU] via SUBCUTANEOUS
  Filled 2014-05-31 (×3): qty 0.05

## 2014-05-31 MED ORDER — FERROUS SULFATE 325 (65 FE) MG PO TABS
325.0000 mg | ORAL_TABLET | Freq: Two times a day (BID) | ORAL | Status: DC
  Filled 2014-05-31 (×4): qty 1

## 2014-05-31 MED ORDER — SODIUM CHLORIDE 0.9 % IV BOLUS (SEPSIS)
1000.0000 mL | Freq: Once | INTRAVENOUS | Status: AC
  Administered 2014-05-31: 1000 mL via INTRAVENOUS

## 2014-05-31 MED ORDER — DEXTROSE-NACL 5-0.9 % IV SOLN
INTRAVENOUS | Status: DC
  Administered 2014-05-31 – 2014-06-02 (×5): via INTRAVENOUS

## 2014-05-31 MED ORDER — DULOXETINE HCL 30 MG PO CPEP
30.0000 mg | ORAL_CAPSULE | Freq: Every day | ORAL | Status: DC
  Administered 2014-05-31 – 2014-06-02 (×3): 30 mg via ORAL
  Filled 2014-05-31 (×3): qty 1

## 2014-05-31 MED ORDER — HEPARIN SODIUM (PORCINE) 5000 UNIT/ML IJ SOLN
5000.0000 [IU] | Freq: Three times a day (TID) | INTRAMUSCULAR | Status: DC
  Administered 2014-05-31 – 2014-06-02 (×6): 5000 [IU] via SUBCUTANEOUS
  Filled 2014-05-31 (×6): qty 1

## 2014-05-31 NOTE — Assessment & Plan Note (Signed)
Patient's BUN and creatinine were severely elevated last week see above dictation. We'll repeat in the hospital. Gentle IV fluids, renal consult.

## 2014-05-31 NOTE — Consult Note (Signed)
Cardiology Office Note   Date: 05/31/2014   ID: [name], DOB 14-Feb-1930, MRN [id number]  PCP: Zigmund Gottron, MD Cardiologist: [name], M.D.  Chief Complaint: Can't eat or drink   History of Present Illness: [name] is a 81 y.o. female who presents for follow-up of dehydration. She has a history of hypertrophic cardiomyopathy, diastolic heart failure, chronic atrial fibrillation not on Coumadin due to GI bleed and frequent falls, and chronic kidney disease. She was started on HCTZ back in December and her creatinine went up from 2.59-4.05 when checked by primary care. HCTZ was changed to every other day. Last 2-D echo 02/2012 EF 55-60% with focal basal septal hypertrophy, mild to moderate MR, severe biatrial enlargement, PA systolic pressure 5 4 mmHg. She had nonobstructive CAD on cath in 07/2005. She has chronic respiratory failure on home O2. She last saw Dr. Aundra Dubin in June 2015 at which time she had some fluid overload on exam and he increased her torsemide 80 mg in the morning 40 in the evening. Her diltiazem was changed to digoxin in February by Dr. Andria Frames to attempt to improve her low blood pressure.  I saw the patient last week accompanied by her daughter and was feeling terrible. Her weight had dropped another 9 pounds since February and is down to 119 pounds. She weighed 151 pounds last June 2015. She lives with her son but he works all day. She has no appetite and has stopped eating and just wants to sleep all the time. She doesn't take her medications properly according to her daughter. She is very forgetful. She says she had a fall Saturday night when she tried to stand up in her wheelchair slid out from under her. She called EMS who is able to get her back in bed. Her daughter can't verify this as she hasn't talked to her brother. Patient says she wants to go live in a nursing home. She has chronic dyspnea and is wearing her oxygen all the time. She only  feels good if she is sleeping. She denies chest pain, palpitations, dizziness or presyncope.  Last week her BUN and creatinine were critical at 179 over 5.4. Her diuretics were held and repeat 2 days later on 05/26/14 were 182 over 5.95. The patient has since refused to eat or drink. The daughter is staying with her day and night and is exhausted. She says she can't hardly help her up to go to the bathroom. The patient just lays her head down and wants to crawl up in fetal position. She says she doesn't want you to her drink. I'm admitting her for dehydration, failure to thrive, and she will probably need nursing home placement.    Past Medical History  Diagnosis Date  . Macular hole of left eye     told will lose sight  . NICM (nonischemic cardiomyopathy)   . Atrial fibrillation     off anticoagulation presumed to anemia  . Coronary atherosclerosis     and old MI and hx of CVA   . Dysphagia   . DM (diabetes mellitus)     nueropathy  . HTN (hypertension)   . HLD (hyperlipidemia)   . Incontinence   . CHF (congestive heart failure)     related to dyastolic dysfunction  . CAD (coronary artery disease)     nonobstructive  . Scoliosis   . Repeated falls     hx  . Stasis dermatitis   . Hearing loss   .  Anemia   . CHF (congestive heart failure)     cardiacc ath EF 25-30%  . Permanent atrial fibrillation   . Symptomatic bradycardia     sp PPM 1997  . Pacemaker     Dr. Olevia Perches; St. Jude VVI  . Heart murmur   . Anginal pain   . Myocardial infarction     "I've had 2" (09/02/2013)  . Pneumonia ~ 2003 X 1  . Neuropathy, diabetic   . History of blood transfusion     "I've had ~ 8 in my life; related to having children"  . Stroke     "I've had 2" (09/02/2013)  . Osteoarthritis     "left foot" (09/02/2013)  . Gout   . Depression     "was taking RX;  not depressed now" (09/02/2013)  . CRI (chronic renal insufficiency)     cr basleine 1.5-1.6  . Chronic renal failure, stage 4 (severe)     [name] 09/02/2013  . Melanoma     "inner right leg; left knee; left ankle"    Past Surgical History  Procedure Laterality Date  . Ankle fracture surgery Left   . Bladder suspension      "tucked up"  . Melanoma excision  X 3    "inner right leg; left knee; left ankle"  . Appendectomy    . Esophagogastroduodenoscopy  11/02/2011    Procedure: ESOPHAGOGASTRODUODENOSCOPY (EGD); Surgeon: Irene Shipper, MD; Location: [facility] ENDOSCOPY; Service: Endoscopy; Laterality: N/A;  . Insert / replace / remove pacemaker  09/27/1995, 04/27/2005    SJM pacemaker implanted by Dr Olevia Perches, generator change 04/27/05  . Tonsillectomy    . Total abdominal hysterectomy    . Tubal ligation    . Cataract extraction w/ intraocular lens implant, bilateral Bilateral   . Pars plana vitrectomy w/ repair of macular hole Left   . Cardiac catheterization    . Right heart catheterization N/A 02/21/2012    Procedure: RIGHT HEART CATH; Surgeon: Jolaine Artist, MD; Location: [facility] CATH LAB; Service: Cardiovascular; Laterality: N/A;     No current outpatient prescriptions on file.   No current facility-administered medications for this visit.    Allergies: Paroxetine; Codeine phosphate; Hydrocodone; and Tramadol hcl    Social History: The patient  reports that she has never smoked. She has never used smokeless tobacco. She reports that she does not drink alcohol or use illicit drugs.   Family History: The patient's family history includes COPD in her mother; Colon cancer in an other family member; Diabetes in an other family member; Heart attack in an other family member; Stroke in an other family member.    ROS: Please see the history of present illness. Otherwise, review of  systems are positive for none. All other systems are reviewed and negative.    PHYSICAL EXAM: BP 82/42 mmHg  Pulse 60  Ht 5\' 5"  (1.651 m)  Wt 119 lb (53.978 kg)  BMI 19.80 kg/m2  SpO2 99% GEN: Elderly, cachectic, frail, in no acute distress  Neck: no JVD, HJR, carotid bruits, or masses Cardiac: Irregular rate and rhythm with 1/6 systolic murmur at the left sternal border, no rubs, thrill or heave,  Respiratory: decreased breath sounds throughout but clear to auscultation bilaterally,  GI: soft, nontender, nondistended, + BS MS: no deformity or atrophy  Extremities: without cyanosis, clubbing, edema, good distal pulses bilaterally.  Skin: tenting of her skin ,warm and dry, no rash Neuro: Very weak, lays her head on the arm of her  wheelchair when trying to speak to her.  EKG: EKG is not ordered today. The ekg ordered last week demonstrates paced rhythm at 60 bpm   Recent Labs: 09/02/2013: ALT 14; TSH 3.780 10/05/2013: Magnesium 1.9 05/24/2014: Hemoglobin 9.7*; Platelets 181.0 05/26/2014: BUN 182*; Creatinine 5.95*; Potassium 4.1; Pro B Natriuretic peptide (BNP) 639.0*; Sodium 133*    Lipid Panel  Labs (Brief)       Component Value Date/Time   CHOL 97 08/20/2013 1140   TRIG 78 08/20/2013 1140   HDL 30* 08/20/2013 1140   CHOLHDL 3.2 08/20/2013 1140   VLDL 16 08/20/2013 1140   LDLCALC 51 08/20/2013 1140   LDLDIRECT 79 03/09/2010 1933       Wt Readings from Last 3 Encounters:  05/31/14 119 lb (53.978 kg)  05/24/14 119 lb (53.978 kg)  04/06/14 128 lb 9.6 oz (58.333 kg)      Other studies Reviewed: Additional studies/ records that were reviewed today include and review of the records demonstrates:  Procedures performed:  1) Right heart catheterization  Description of procedure:   The risks and indication of the procedure were explained. Consent was signed and placed on the chart. An appropriate timeout was taken prior  to the procedure. The right neck was prepped and draped in the routine sterile fashion and anesthetized with 1% local lidocaine.   A 7 FR venous sheath was placed in the right internal jugular vein using a modified Seldinger technique. A standard Swan-Ganz catheter was used for the procedure.   Complications: None apparent.  Findings:  Done on dopamine 59mcg/kg/min  RA = 15 RV = 52/12/15 PA = 54/27 (37) PCW = 20 Fick cardiac output/index = 4.5/2.4 Thermo CO/CI = 3.2/1.7 PVR = 5.4 Woods (Thermo) PA sat = 30%, 32% FA sat = 89% on 4L O2  Assessment & Plan:  Hemodynamics most consistent with RV failure (and mild pHTN). Given low PA sat suspect Thermodilution CO more accurate than Fick calculation. Increase dopamine to 5 mcg/kg/min (run through [place]). Resume lasix gtt at 10 mg/hr. Follow renal function closely.   Daniel Bensimhon 1:40 PM        2-D echo January 2014  Procedures performed:  1) Right heart catheterization  Description of procedure:   The risks and indication of the procedure were explained. Consent was signed and placed on the chart. An appropriate timeout was taken prior to the procedure. The right neck was prepped and draped in the routine sterile fashion and anesthetized with 1% local lidocaine.   A 7 FR venous sheath was placed in the right internal jugular vein using a modified Seldinger technique. A standard Swan-Ganz catheter was used for the procedure.   Complications: None apparent.  Findings:  Done on dopamine 31mcg/kg/min  RA = 15 RV = 52/12/15 PA = 54/27 (37) PCW = 20 Fick cardiac output/index = 4.5/2.4 Thermo CO/CI = 3.2/1.7 PVR = 5.4 Woods (Thermo) PA sat = 30%, 32% FA sat = 89% on 4L O2  Assessment & Plan:  Hemodynamics most consistent with RV failure (and mild pHTN). Given low PA sat suspect Thermodilution CO more accurate than Fick calculation. Increase dopamine to 5 mcg/kg/min (run through [place]). Resume lasix gtt at 10 mg/hr.  Follow renal function closely.   Daniel Bensimhon 1:40 PM          Study Conclusions  - Left ventricle: There was focal basal hypertrophy. Systolic function was normal. The estimated ejection fraction was in the range of 55% to 60%. - Mitral valve:  Mild to moderate regurgitation. - Left atrium: The atrium was severely dilated. - Right atrium: The atrium was severely dilated. - Tricuspid valve: Moderate regurgitation. - Pulmonary arteries: Systolic pressure was moderately increased. PA peak pressure: 29mm Hg (S).   ASSESSMENT AND PLAN: Dehydration Patient is severely dehydrated. She's had significant weight loss and is refusing to eat or drink. Her diuretics have been held for over a week. We could not get a weight on her today because she is too unstable. She is hypotensive and in renal failure. Admit to hospital for IV fluids, hospitalist consult, renal consult, and social workers will need to see to help with nursing home placement. And is a no CODE BLUE   Atrial fibrillation Patient's diltiazem was stopped due to low blood pressure by Dr. Andria Frames. Her rate was controlled on Coreg and dig was added by Dr. Andria Frames. Her blood pressure is too low to give her Coreg at this point. Will monitor in the hospital. Pacer was supposed to be checked today. We'll check in the hospital. Hasn't been checked since 2013.   Chronic diastolic congestive heart failure Patient is severely dehydrated. She's been off diuretics for over a week.   Anorexia As of last week patient had lost 32 pounds since last June. She has refused acute or drink. She has no appetite and poor memory. She lives with her son who works all day. Her daughter was staying with her over the weekend and is unable to care for her anymore. She will need to be placed in a nursing home.   Chronic kidney disease (CKD), stage IV (severe) Patient's BUN and creatinine were severely elevated last week see above dictation.  We'll repeat in the hospital. Gentle IV fluids, renal consult.   PACEMAKER-St.Jude Pacer hasn't been checked since 2013. To be checked in the hospital.      Signed, Ermalinda Barrios, PA-C  05/31/2014 10:58 AM  [address], [place], [place] [address] Phone: [phone]; Fax: [phone]    ADDENDUM 05/31/2014: The patient is independently interviewed and examined. She is an 81 year old woman admitted from our office with pronounced failure to thrive. She has had minimal by mouth intake over the last several days. The patient has been noted to have progressive kidney disease with renal indices last week showing a creatinine of 5.95 and a BUN of 182. The patient is admitted to the hospital for further evaluation and management. Her daughter is at the bedside. The family can no longer take care of the patient and they have requested nursing home placement.  The patient denies chest pain or shortness of breath. She feels extremely weak. She is unable to walk now for several weeks.  On physical exam, she is a pleasant, elderly woman in no acute distress. Lung fields are clear. Heart is regular rate and rhythm without murmur or gallop. The patient's skin is very dry. There is no peripheral edema.  The patient's lab work is reviewed and her CBC has resulted. Her metabolic panel is still pending. EKG shows ventricular pacing at 60 bpm.  Please see above documentation for the patient's assessment and plan. I agree that she clearly requires hospitalization with a focus on goals of care. She has progressive and now end-stage kidney disease. I am not sure that she will be a candidate for hemodialysis because of her very poor functional status and continued decline. As her cardiac problems appear stable and are not related to the reasons for  hospitalization, I have requested that the family medicine service take over her care. I have spoken to the resident who  has kindly agreed to accept her onto their service. Further plans as per the family medicine inpatient service. We would be happy to help as needed.  Sherren Mocha MD 05/31/2014 12:57 PM

## 2014-05-31 NOTE — Assessment & Plan Note (Signed)
As of last week patient had lost 32 pounds since last June. She has refused acute or drink. She has no appetite and poor memory. She lives with her son who works all day. Her daughter was staying with her over the weekend and is unable to care for her anymore. She will need to be placed in a nursing home.

## 2014-05-31 NOTE — Progress Notes (Signed)
CRITICAL VALUE ALERT  Critical value received:  Digoxin = 4.2  Date of notification:  05-31-2014  Time of notification:  13:49  Critical value read back:Yes.    Nurse who received alert:  Genelle Bal, RN  MD notified (1st page):  McDiarmid  Time of first page:  13:50  MD notified (2nd page):  Time of second page:  Responding MD:  Jerline Pain   Time MD responded:

## 2014-05-31 NOTE — Progress Notes (Signed)
PCP Social Note:  I accompanied Dr. McDiarmid, the [place] attending this week, to see [name].  Mine was mostly a social visit.  I did confirm her end of life wishes: DNR, DNI.  No ICU or stepdown care.  No dialysis.  No heroic measures.  She would like treatment of reversible measures.  I agree that she seems dehydrated/overly diuresed.  Weight in Dec 2015 was 137 and was volume overloaded.  Current weight is 117 and looks dry.  My guess is that her euvolemic or best weight is in the mid 120 range.  I think she can use some gentle hydration.    She has symptomatic uremia (pruritis and ammonia odor.)  She is also dig toxic - likely due to her worsened renal status.  Finally, she wants NHP at dispo.  She feels she cannot return to her current living situation. Domenic Schwab

## 2014-05-31 NOTE — H&P (Signed)
Cardiology Office Note   Date:  05/31/2014   ID:  [name], DOB 1930-05-24, MRN [id number]  PCP:  Zigmund Gottron, MD  Cardiologist: [name], M.D.  Chief Complaint: Can't eat or drink    History of Present Illness: [name] is a 81 y.o. female who presents for follow-up of dehydration.  She has a history of hypertrophic cardiomyopathy, diastolic heart failure, chronic atrial fibrillation not on Coumadin due to GI bleed and frequent falls, and chronic kidney disease. She was started on HCTZ back in December and her creatinine went up from 2.59-4.05 when checked by primary care. HCTZ was changed to every other day. Last 2-D echo 02/2012 EF 55-60% with focal basal septal hypertrophy, mild to moderate MR, severe biatrial enlargement, PA systolic pressure 5 4 mmHg. She had nonobstructive CAD on cath in 07/2005. She has chronic respiratory failure on home O2. She last saw Dr. Aundra Dubin in June 2015 at which time she had some fluid overload on exam and he increased her torsemide 80 mg in the morning 40 in the evening. Her diltiazem was changed to digoxin in February by Dr. Andria Frames to attempt to improve her low blood pressure.  I saw the patient last week accompanied by her daughter and was feeling terrible. Her weight had dropped another 9 pounds since February and is down to 119 pounds. She weighed 151 pounds last June 2015. She lives with her son but he works all day. She has no appetite and has stopped eating and just wants to sleep all the time. She doesn't take her medications properly according to her daughter. She is very forgetful. She says she had a fall Saturday night when she tried to stand up in her wheelchair slid out from under her. She called EMS who is able to get her back in bed. Her daughter can't verify this as she hasn't talked to her brother. Patient says she wants to go live in a nursing home. She has chronic dyspnea and is wearing her oxygen all the time. She only feels  good if she is sleeping. She denies chest pain, palpitations, dizziness or presyncope.  Last week her BUN and creatinine were critical at 179 over 5.4. Her diuretics were held and repeat 2 days later on 05/26/14 were 182 over 5.95. The patient has since refused to eat or drink. The daughter is staying with her day and night and is exhausted. She says she can't hardly help her up to go to the bathroom. The patient just lays her head down and wants to crawl up in fetal position. She says she doesn't want you to her drink. I'm admitting her for dehydration, failure to thrive, and she will probably need nursing home placement.    Past Medical History  Diagnosis Date  . Macular hole of left eye     told will lose sight  . NICM (nonischemic cardiomyopathy)   . Atrial fibrillation     off anticoagulation presumed to anemia  . Coronary atherosclerosis     and old MI and hx of CVA   . Dysphagia   . DM (diabetes mellitus)     nueropathy  . HTN (hypertension)   . HLD (hyperlipidemia)   . Incontinence   . CHF (congestive heart failure)     related to dyastolic dysfunction  . CAD (coronary artery disease)     nonobstructive  . Scoliosis   . Repeated falls     hx  . Stasis dermatitis   .  Hearing loss   . Anemia   . CHF (congestive heart failure)     cardiacc ath EF 25-30%  . Permanent atrial fibrillation   . Symptomatic bradycardia     sp PPM 1997  . Pacemaker     Dr. Olevia Perches; St. Jude VVI  . Heart murmur   . Anginal pain   . Myocardial infarction     "I've had 2" (09/02/2013)  . Pneumonia ~ 2003 X 1  . Neuropathy, diabetic   . History of blood transfusion     "I've had ~ 8 in my life; related to having children"  . Stroke     "I've had 2" (09/02/2013)  . Osteoarthritis     "left foot" (09/02/2013)  . Gout   . Depression     "was taking RX; not depressed now" (09/02/2013)  . CRI (chronic renal insufficiency)     cr basleine 1.5-1.6  . Chronic renal failure, stage 4 (severe)      [name] 09/02/2013  . Melanoma     "inner right leg; left knee; left ankle"    Past Surgical History  Procedure Laterality Date  . Ankle fracture surgery Left   . Bladder suspension      "tucked up"  . Melanoma excision  X 3    "inner right leg; left knee; left ankle"  . Appendectomy    . Esophagogastroduodenoscopy  11/02/2011    Procedure: ESOPHAGOGASTRODUODENOSCOPY (EGD);  Surgeon: Irene Shipper, MD;  Location: [facility] ENDOSCOPY;  Service: Endoscopy;  Laterality: N/A;  . Insert / replace / remove pacemaker  09/27/1995, 04/27/2005    SJM pacemaker implanted by Dr Olevia Perches, generator change 04/27/05  . Tonsillectomy    . Total abdominal hysterectomy    . Tubal ligation    . Cataract extraction w/ intraocular lens  implant, bilateral Bilateral   . Pars plana vitrectomy w/ repair of macular hole Left   . Cardiac catheterization    . Right heart catheterization N/A 02/21/2012    Procedure: RIGHT HEART CATH;  Surgeon: Jolaine Artist, MD;  Location: [facility] CATH LAB;  Service: Cardiovascular;  Laterality: N/A;     No current outpatient prescriptions on file.   No current facility-administered medications for this visit.    Allergies:   Paroxetine; Codeine phosphate; Hydrocodone; and Tramadol hcl    Social History:  The patient  reports that she has never smoked. She has never used smokeless tobacco. She reports that she does not drink alcohol or use illicit drugs.   Family History:  The patient's family history includes COPD in her mother; Colon cancer in an other family member; Diabetes in an other family member; Heart attack in an other family member; Stroke in an other family member.    ROS:  Please see the history of present illness.   Otherwise, review of systems are positive for none.   All other systems are reviewed and negative.    PHYSICAL EXAM: BP 82/42 mmHg  Pulse 60  Ht 5\' 5"  (1.651 m)  Wt 119 lb (53.978 kg)  BMI 19.80 kg/m2  SpO2 99% GEN: Elderly, cachectic, frail, in no  acute distress Neck: no JVD, HJR, carotid bruits, or masses Cardiac: Irregular rate and rhythm with 1/6 systolic murmur at the left sternal border, no rubs, thrill or heave,  Respiratory:   decreased breath sounds throughout but clear to auscultation bilaterally,  GI: soft, nontender, nondistended, + BS MS: no deformity or atrophy Extremities: without cyanosis, clubbing, edema, good distal pulses  bilaterally.  Skin:  tenting of her skin ,warm and dry, no rash Neuro: Very weak, lays her head on the arm of her wheelchair when trying to speak to her.  EKG:  EKG is not ordered today. The ekg ordered last week demonstrates paced rhythm at 60 bpm   Recent Labs: 09/02/2013: ALT 14; TSH 3.780 10/05/2013: Magnesium 1.9 05/24/2014: Hemoglobin 9.7*; Platelets 181.0 05/26/2014: BUN 182*; Creatinine 5.95*; Potassium 4.1; Pro B Natriuretic peptide (BNP) 639.0*; Sodium 133*    Lipid Panel    Component Value Date/Time   CHOL 97 08/20/2013 1140   TRIG 78 08/20/2013 1140   HDL 30* 08/20/2013 1140   CHOLHDL 3.2 08/20/2013 1140   VLDL 16 08/20/2013 1140   LDLCALC 51 08/20/2013 1140   LDLDIRECT 79 03/09/2010 1933      Wt Readings from Last 3 Encounters:  05/31/14 119 lb (53.978 kg)  05/24/14 119 lb (53.978 kg)  04/06/14 128 lb 9.6 oz (58.333 kg)      Other studies Reviewed: Additional studies/ records that were reviewed today include and review of the records demonstrates:  Procedures performed:  1) Right heart catheterization  Description of procedure:   The risks and indication of the procedure were explained. Consent was signed and placed on the chart. An appropriate timeout was taken prior to the procedure. The right neck was prepped and draped in the routine sterile fashion and anesthetized with 1% local lidocaine.   A 7 FR venous sheath was placed in the right internal jugular vein using a modified Seldinger technique. A standard Swan-Ganz catheter was used for the procedure.    Complications: None apparent.  Findings:  Done on dopamine 37mcg/kg/min  RA = 15 RV = 52/12/15 PA = 54/27 (37) PCW = 20 Fick cardiac output/index = 4.5/2.4 Thermo CO/CI =  3.2/1.7 PVR = 5.4 Woods (Thermo) PA sat = 30%, 32% FA sat = 89% on 4L O2  Assessment & Plan:  Hemodynamics most consistent with RV failure (and mild pHTN). Given low PA sat suspect Thermodilution CO more accurate than Fick calculation. Increase dopamine to 5 mcg/kg/min (run through [place]). Resume lasix gtt at 10 mg/hr. Follow renal function closely.   Daniel Bensimhon 1:40 PM         2-D echo January 2014  Procedures performed:  1) Right heart catheterization  Description of procedure:   The risks and indication of the procedure were explained. Consent was signed and placed on the chart. An appropriate timeout was taken prior to the procedure. The right neck was prepped and draped in the routine sterile fashion and anesthetized with 1% local lidocaine.   A 7 FR venous sheath was placed in the right internal jugular vein using a modified Seldinger technique. A standard Swan-Ganz catheter was used for the procedure.   Complications: None apparent.  Findings:  Done on dopamine 51mcg/kg/min  RA = 15 RV = 52/12/15 PA = 54/27 (37) PCW = 20 Fick cardiac output/index = 4.5/2.4 Thermo CO/CI =  3.2/1.7 PVR = 5.4 Woods (Thermo) PA sat = 30%, 32% FA sat = 89% on 4L O2  Assessment & Plan:  Hemodynamics most consistent with RV failure (and mild pHTN). Given low PA sat suspect Thermodilution CO more accurate than Fick calculation. Increase dopamine to 5 mcg/kg/min (run through [place]). Resume lasix gtt at 10 mg/hr. Follow renal function closely.   Daniel Bensimhon 1:40 PM            Study Conclusions  - Left ventricle: There was  focal basal hypertrophy.   Systolic function was normal. The estimated ejection   fraction was in the range of 55% to 60%. - Mitral valve: Mild to moderate  regurgitation. - Left atrium: The atrium was severely dilated. - Right atrium: The atrium was severely dilated. - Tricuspid valve: Moderate regurgitation. - Pulmonary arteries: Systolic pressure was moderately   increased. PA peak pressure: 37mm Hg (S).   ASSESSMENT AND PLAN: Dehydration Patient is severely dehydrated. She's had significant weight loss and is refusing to eat or drink. Her diuretics have been held for over a week. We could not get a weight on her today because she is too unstable. She is hypotensive and in renal failure. Admit to hospital for IV fluids, hospitalist consult, renal consult, and social workers will need to see to help with nursing home placement. And is a no CODE BLUE   Atrial fibrillation Patient's diltiazem was stopped due to low blood pressure by Dr. Andria Frames. Her rate was controlled on Coreg and dig was added by Dr. Andria Frames. Her blood pressure is too low to give her Coreg at this point. Will monitor in the hospital. Pacer was supposed to be checked today. We'll check in the hospital. Hasn't been checked since 2013.   Chronic diastolic congestive heart failure Patient is severely dehydrated. She's been off diuretics for over a week.   Anorexia As of last week patient had lost 32 pounds since last June. She has refused acute or drink. She has no appetite and poor memory. She lives with her son who works all day. Her daughter was staying with her over the weekend and is unable to care for her anymore. She will need to be placed in a nursing home.   Chronic kidney disease (CKD), stage IV (severe) Patient's BUN and creatinine were severely elevated last week see above dictation. We'll repeat in the hospital. Gentle IV fluids, renal consult.   PACEMAKER-St.Jude Pacer hasn't been checked since 2013. To be checked in the hospital.      Signed, Ermalinda Barrios, PA-C  05/31/2014 10:58 AM    [address], [place],    [address] Phone: [phone]; Fax: [phone]

## 2014-05-31 NOTE — Consult Note (Addendum)
Patient [name]      DOB: [birth year]      RJJ:[id number]     Consult Note from the Palliative Medicine Team at [facility] Requested by: Dr [name]     PCP: Lindsey Gottron, MD Reason for Consultation: FTT    Phone Number:[phone]  Assessment/Recommendations: 81 yo female with multiple medical problems as below, admitted with failure to thrive.  1.  Code Status: DNR documented  2. GOC: See documentation from PCP Dr [name] today. Will likely need to see how things go here. Current goal seems to be nursing home placement. I attempted to reach daughter today but no answer on phone and unable to leave message.    3. Symptom Management:   1. FTT: most likely driven by heart failure and CKD w/signifcant uremia.  They are seeking nursing home placement. Many appetite stimulants would carry side effect profile and not sure we would get much benefit given her functional status and underlying issues.  2. Uremic Pruritis not bothering her at time of my exam, but with markedly elevated BUN. Certainly can try anti-histamines if necessary.    4. Psychosocial/Spiritual: Was living at home with her son [name]. Has 6 children in total. Daughter [name] also involved in her care at home.     Brief HPI: 81 yo female with PMHx of HOCM, HFpEF, AFib (not on anticaog 2/2 GI Bleed history), CKD who was admitted from cardiology office today with weakness, decreased mobility, little to no PO intake over the past few weeks.  Also had recent fall at home. Family reportedly feels like they can no longer take care of her. [name] today states that things going poorly at home and she knows she has issues with her heart and kidneys. Appears upset at how things have been going at home but hard time elaborating for me today.  With her dehydration and decreased PO intake, had her diuretics held last week. AKI on CKD here.  Increased dig level.   FTT and patient/family seeking nursing home placement.   [name] has no symptomatic complaints today but did tell Dr [name] of pruritis which is unsurprising given her degree of uremia.        PMH:  Past Medical History  Diagnosis Date  . Macular hole of left eye     told will lose sight  . NICM (nonischemic cardiomyopathy)   . Atrial fibrillation     off anticoagulation presumed to anemia  . Coronary atherosclerosis     and old MI and hx of CVA   . Dysphagia   . DM (diabetes mellitus)     nueropathy  . HTN (hypertension)   . HLD (hyperlipidemia)   . Incontinence   . CHF (congestive heart failure)     related to dyastolic dysfunction  . CAD (coronary artery disease)     nonobstructive  . Scoliosis   . Repeated falls     hx  . Stasis dermatitis   . Hearing loss   . Anemia   . CHF (congestive heart failure)     cardiacc ath EF 25-30%  . Permanent atrial fibrillation   . Symptomatic bradycardia     sp PPM 1997  . Pacemaker     Dr. Olevia [name]; St. Jude VVI  . Heart murmur   . Anginal pain   . Myocardial infarction     "I've had 2" (09/02/2013)  . Pneumonia ~ 2003 X 1  . Neuropathy, diabetic   .  History of blood transfusion     "I've had ~ 8 in my life; related to having children"  . Stroke     "I've had 2" (09/02/2013)  . Osteoarthritis     "left foot" (09/02/2013)  . Gout   . Depression     "was taking RX; not depressed now" (09/02/2013)  . CRI (chronic renal insufficiency)     cr basleine 1.5-1.6  . Chronic renal failure, stage 4 (severe)     [name] 09/02/2013  . Melanoma     "inner right leg; left knee; left ankle"  . Acute renal failure 05/31/2014     PSH: Past Surgical History  Procedure Laterality Date  . Ankle fracture surgery Left   . Bladder suspension      "tucked up"  . Melanoma excision  X 3    "inner right leg; left knee; left ankle"  . Appendectomy    . Esophagogastroduodenoscopy  11/02/2011    Procedure: ESOPHAGOGASTRODUODENOSCOPY (EGD);  Surgeon: Lindsey Shipper, MD;  Location: [facility] ENDOSCOPY;  Service:  Endoscopy;  Laterality: N/A;  . Insert / replace / remove pacemaker  09/27/1995, 04/27/2005    SJM pacemaker implanted by Dr [name], generator change 04/27/05  . Tonsillectomy    . Total abdominal hysterectomy    . Tubal ligation    . Cataract extraction w/ intraocular lens  implant, bilateral Bilateral   . Pars plana vitrectomy w/ repair of macular hole Left   . Cardiac catheterization    . Right heart catheterization N/A 02/21/2012    Procedure: RIGHT HEART CATH;  Surgeon: Lindsey Artist, MD;  Location: [facility] CATH LAB;  Service: Cardiovascular;  Laterality: N/A;   I have reviewed the FH and SH and  If appropriate update it with new information. Allergies  Allergen Reactions  . Paroxetine Other (See Comments)    hallucinations and falls  . Codeine Phosphate Rash  . Hydrocodone Itching, Nausea Only and Rash  . Tramadol Hcl Hives and Other (See Comments)        Scheduled Meds: . atorvastatin  10 mg Oral q1800  . carvedilol  25 mg Oral BID WC  . digoxin immune fab (DIGIFAB) IV  2 vial Intravenous STAT  . DULoxetine  30 mg Oral Daily  . ferrous sulfate  325 mg Oral BID WC  . insulin glargine  5 Units Subcutaneous QHS  . sodium chloride  1,000 mL Intravenous Once   Continuous Infusions: . dextrose 5 % and 0.9% NaCl 150 mL/hr at 05/31/14 1539   PRN Meds:.polyethylene glycol    BP 129/85 mmHg  Pulse 98  Temp(Src) 97.5 F (36.4 C) (Oral)  Resp 18  Ht 5\' 5"  (1.651 m)  Wt 53.1 kg (117 lb 1 oz)  BMI 19.48 kg/m2  SpO2 96%   PPS: 40  No intake or output data in the 24 hours ending 05/31/14 1609   Physical Exam:  General: Alert, NAD HEENT:  [name], sclera anicteric, mmm Neck: supple Chest:   CTAB CVS: RRR Abdomen: soft, ND Ext: no edmea Skin: tenting  Labs: CBC    Component Value Date/Time   WBC 7.1 05/31/2014 1216   RBC 2.89* 05/31/2014 1216   RBC 2.78* 02/19/2012 0149   HGB 9.0* 05/31/2014 1216   HGB 8.3* 12/19/2012 0915   HCT 26.8* 05/31/2014 1216   PLT 130*  05/31/2014 1216   MCV 92.7 05/31/2014 1216   MCH 31.1 05/31/2014 1216   MCHC 33.6 05/31/2014 1216   RDW 14.5 05/31/2014 1216  LYMPHSABS 0.6* 05/31/2014 1216   MONOABS 0.7 05/31/2014 1216   EOSABS 0.1 05/31/2014 1216   BASOSABS 0.0 05/31/2014 1216    BMET    Component Value Date/Time   NA 137 05/31/2014 1216   K 5.7* 05/31/2014 1216   CL 95* 05/31/2014 1216   CO2 25 05/31/2014 1216   GLUCOSE 138* 05/31/2014 1216   BUN 174* 05/31/2014 1216   CREATININE 5.72* 05/31/2014 1216   CREATININE 4.05* 04/06/2014 1550   CALCIUM 9.8 05/31/2014 1216   GFRNONAA 6* 05/31/2014 1216   GFRAA 7* 05/31/2014 1216    CMP     Component Value Date/Time   NA 137 05/31/2014 1216   K 5.7* 05/31/2014 1216   CL 95* 05/31/2014 1216   CO2 25 05/31/2014 1216   GLUCOSE 138* 05/31/2014 1216   BUN 174* 05/31/2014 1216   CREATININE 5.72* 05/31/2014 1216   CREATININE 4.05* 04/06/2014 1550   CALCIUM 9.8 05/31/2014 1216   PROT 7.4 05/31/2014 1216   ALBUMIN 3.4* 05/31/2014 1216   AST 20 05/31/2014 1216   ALT 16 05/31/2014 1216   ALKPHOS 71 05/31/2014 1216   BILITOT 0.9 05/31/2014 1216   GFRNONAA 6* 05/31/2014 1216   GFRAA 7* 05/31/2014 1216    Total Time: 50 minutes Greater than 50%  of this time was spent counseling and coordinating care related to the above assessment and plan.  Doran Clay D.O. Palliative Medicine Team at [facility]  Pager: [phone] Team Phone: [phone]

## 2014-05-31 NOTE — Assessment & Plan Note (Signed)
Patient is severely dehydrated. She's been off diuretics for over a week.

## 2014-05-31 NOTE — Assessment & Plan Note (Signed)
Patient's diltiazem was stopped due to low blood pressure by Dr. Andria Frames. Her rate was controlled on Coreg and dig was added by Dr. Andria Frames. Her blood pressure is too low to give her Coreg at this point. Will monitor in the hospital. Pacer was supposed to be checked today. We'll check in the hospital. Hasn't been checked since 2013.

## 2014-05-31 NOTE — Progress Notes (Addendum)
Cardiology Office Note   Date:  05/31/2014   ID:  [name], DOB 1930-03-27, MRN [id number]  PCP:  Lindsey Gottron, MD  Cardiologist: [name], M.D.  Chief Complaint: Can't eat or drink    History of Present Illness: [name] is a 81 y.o. female who presents for follow-up of dehydration.  She has a history of hypertrophic cardiomyopathy, diastolic heart failure, chronic atrial fibrillation not on Coumadin due to GI bleed and frequent falls, and chronic kidney disease. She was started on HCTZ back in December and her creatinine went up from 2.59-4.05 when checked by primary care. HCTZ was changed to every other day. Last 2-D echo 02/2012 EF 55-60% with focal basal septal hypertrophy, mild to moderate MR, severe biatrial enlargement, PA systolic pressure 5 4 mmHg. She had nonobstructive CAD on cath in 07/2005. She has chronic respiratory failure on home O2. She last saw Dr. Aundra Dubin in June 2015 at which time she had some fluid overload on exam and he increased her torsemide 80 mg in the morning 40 in the evening. Her diltiazem was changed to digoxin in February by Dr. Andria Frames to attempt to improve her low blood pressure.  I saw the patient last week accompanied by her daughter and was feeling terrible. Her weight had dropped another 9 pounds since February and is down to 119 pounds. She weighed 151 pounds last June 2015. She lives with her son but he works all day. She has no appetite and has stopped eating and just wants to sleep all the time. She doesn't take her medications properly according to her daughter. She is very forgetful. She says she had a fall Saturday night when she tried to stand up in her wheelchair slid out from under her. She called EMS who is able to get her back in bed. Her daughter can't verify this as she hasn't talked to her brother. Patient says she wants to go live in a nursing home. She has chronic dyspnea and is wearing her oxygen all the time. She only feels  good if she is sleeping. She denies chest pain, palpitations, dizziness or presyncope.  Last week her BUN and creatinine were critical at 179 over 5.4. Her diuretics were held and repeat 2 days later on 05/26/14 were 182 over 5.95. The patient has since refused to eat or drink. The daughter is staying with her day and night and is exhausted. She says she can't hardly help her up to go to the bathroom. The patient just lays her head down and wants to crawl up in fetal position. She says she doesn't want you to her drink. I'm admitting her for dehydration, failure to thrive, and she will probably need nursing home placement.    Past Medical History  Diagnosis Date  . Macular hole of left eye     told will lose sight  . NICM (nonischemic cardiomyopathy)   . Atrial fibrillation     off anticoagulation presumed to anemia  . Coronary atherosclerosis     and old MI and hx of CVA   . Dysphagia   . DM (diabetes mellitus)     nueropathy  . HTN (hypertension)   . HLD (hyperlipidemia)   . Incontinence   . CHF (congestive heart failure)     related to dyastolic dysfunction  . CAD (coronary artery disease)     nonobstructive  . Scoliosis   . Repeated falls     hx  . Stasis dermatitis   .  Hearing loss   . Anemia   . CHF (congestive heart failure)     cardiacc ath EF 25-30%  . Permanent atrial fibrillation   . Symptomatic bradycardia     sp PPM 1997  . Pacemaker     Dr. Olevia Perches; St. Jude VVI  . Heart murmur   . Anginal pain   . Myocardial infarction     "I've had 2" (09/02/2013)  . Pneumonia ~ 2003 X 1  . Neuropathy, diabetic   . History of blood transfusion     "I've had ~ 8 in my life; related to having children"  . Stroke     "I've had 2" (09/02/2013)  . Osteoarthritis     "left foot" (09/02/2013)  . Gout   . Depression     "was taking RX; not depressed now" (09/02/2013)  . CRI (chronic renal insufficiency)     cr basleine 1.5-1.6  . Chronic renal failure, stage 4 (severe)      [name] 09/02/2013  . Melanoma     "inner right leg; left knee; left ankle"    Past Surgical History  Procedure Laterality Date  . Ankle fracture surgery Left   . Bladder suspension      "tucked up"  . Melanoma excision  X 3    "inner right leg; left knee; left ankle"  . Appendectomy    . Esophagogastroduodenoscopy  11/02/2011    Procedure: ESOPHAGOGASTRODUODENOSCOPY (EGD);  Surgeon: Irene Shipper, MD;  Location: [facility] ENDOSCOPY;  Service: Endoscopy;  Laterality: N/A;  . Insert / replace / remove pacemaker  09/27/1995, 04/27/2005    SJM pacemaker implanted by Dr Olevia Perches, generator change 04/27/05  . Tonsillectomy    . Total abdominal hysterectomy    . Tubal ligation    . Cataract extraction w/ intraocular lens  implant, bilateral Bilateral   . Pars plana vitrectomy w/ repair of macular hole Left   . Cardiac catheterization    . Right heart catheterization N/A 02/21/2012    Procedure: RIGHT HEART CATH;  Surgeon: Jolaine Artist, MD;  Location: [facility] CATH LAB;  Service: Cardiovascular;  Laterality: N/A;     No current outpatient prescriptions on file.   No current facility-administered medications for this visit.    Allergies:   Paroxetine; Codeine phosphate; Hydrocodone; and Tramadol hcl    Social History:  The patient  reports that she has never smoked. She has never used smokeless tobacco. She reports that she does not drink alcohol or use illicit drugs.   Family History:  The patient's family history includes COPD in her mother; Colon cancer in an other family member; Diabetes in an other family member; Heart attack in an other family member; Stroke in an other family member.    ROS:  Please see the history of present illness.   Otherwise, review of systems are positive for none.   All other systems are reviewed and negative.    PHYSICAL EXAM: BP 82/42 mmHg  Pulse 60  Ht 5\' 5"  (1.651 m)  Wt 119 lb (53.978 kg)  BMI 19.80 kg/m2  SpO2 99% GEN: Elderly, cachectic, frail, in no  acute distress Neck: no JVD, HJR, carotid bruits, or masses Cardiac: Irregular rate and rhythm with 1/6 systolic murmur at the left sternal border, no rubs, thrill or heave,  Respiratory:   decreased breath sounds throughout but clear to auscultation bilaterally,  GI: soft, nontender, nondistended, + BS MS: no deformity or atrophy Extremities: without cyanosis, clubbing, edema, good distal pulses  bilaterally.  Skin:  tenting of her skin ,warm and dry, no rash Neuro: Very weak, lays her head on the arm of her wheelchair when trying to speak to her.  EKG:  EKG is not ordered today. The ekg ordered last week demonstrates paced rhythm at 60 bpm   Recent Labs: 09/02/2013: ALT 14; TSH 3.780 10/05/2013: Magnesium 1.9 05/24/2014: Hemoglobin 9.7*; Platelets 181.0 05/26/2014: BUN 182*; Creatinine 5.95*; Potassium 4.1; Pro B Natriuretic peptide (BNP) 639.0*; Sodium 133*    Lipid Panel    Component Value Date/Time   CHOL 97 08/20/2013 1140   TRIG 78 08/20/2013 1140   HDL 30* 08/20/2013 1140   CHOLHDL 3.2 08/20/2013 1140   VLDL 16 08/20/2013 1140   LDLCALC 51 08/20/2013 1140   LDLDIRECT 79 03/09/2010 1933      Wt Readings from Last 3 Encounters:  05/31/14 119 lb (53.978 kg)  05/24/14 119 lb (53.978 kg)  04/06/14 128 lb 9.6 oz (58.333 kg)      Other studies Reviewed: Additional studies/ records that were reviewed today include and review of the records demonstrates:  Procedures performed:  1) Right heart catheterization  Description of procedure:   The risks and indication of the procedure were explained. Consent was signed and placed on the chart. An appropriate timeout was taken prior to the procedure. The right neck was prepped and draped in the routine sterile fashion and anesthetized with 1% local lidocaine.   A 7 FR venous sheath was placed in the right internal jugular vein using a modified Seldinger technique. A standard Swan-Ganz catheter was used for the procedure.    Complications: None apparent.  Findings:  Done on dopamine 89mcg/kg/min  RA = 15 RV = 52/12/15 PA = 54/27 (37) PCW = 20 Fick cardiac output/index = 4.5/2.4 Thermo CO/CI =  3.2/1.7 PVR = 5.4 Woods (Thermo) PA sat = 30%, 32% FA sat = 89% on 4L O2  Assessment & Plan:  Hemodynamics most consistent with RV failure (and mild pHTN). Given low PA sat suspect Thermodilution CO more accurate than Fick calculation. Increase dopamine to 5 mcg/kg/min (run through [place]). Resume lasix gtt at 10 mg/hr. Follow renal function closely.   Daniel Bensimhon 1:40 PM         2-D echo January 2014  Procedures performed:  1) Right heart catheterization  Description of procedure:   The risks and indication of the procedure were explained. Consent was signed and placed on the chart. An appropriate timeout was taken prior to the procedure. The right neck was prepped and draped in the routine sterile fashion and anesthetized with 1% local lidocaine.   A 7 FR venous sheath was placed in the right internal jugular vein using a modified Seldinger technique. A standard Swan-Ganz catheter was used for the procedure.   Complications: None apparent.  Findings:  Done on dopamine 94mcg/kg/min  RA = 15 RV = 52/12/15 PA = 54/27 (37) PCW = 20 Fick cardiac output/index = 4.5/2.4 Thermo CO/CI =  3.2/1.7 PVR = 5.4 Woods (Thermo) PA sat = 30%, 32% FA sat = 89% on 4L O2  Assessment & Plan:  Hemodynamics most consistent with RV failure (and mild pHTN). Given low PA sat suspect Thermodilution CO more accurate than Fick calculation. Increase dopamine to 5 mcg/kg/min (run through [place] ). Resume lasix gtt at 10 mg/hr. Follow renal function closely.   Daniel Bensimhon 1:40 PM            Study Conclusions  - Left ventricle: There was  focal basal hypertrophy.   Systolic function was normal. The estimated ejection   fraction was in the range of 55% to 60%. - Mitral valve: Mild to moderate  regurgitation. - Left atrium: The atrium was severely dilated. - Right atrium: The atrium was severely dilated. - Tricuspid valve: Moderate regurgitation. - Pulmonary arteries: Systolic pressure was moderately   increased. PA peak pressure: 80mm Hg (S).   ASSESSMENT AND PLAN: Dehydration Patient is severely dehydrated. She's had significant weight loss and is refusing to eat or drink. Her diuretics have been held for over a week. We could not get a weight on her today because she is too unstable. She is hypotensive and in renal failure. Admit to hospital for IV fluids, hospitalist consult, renal consult, and social workers will need to see to help with nursing home placement. And is a no CODE BLUE   Atrial fibrillation Patient's diltiazem was stopped due to low blood pressure by Dr. Andria Frames. Her rate was controlled on Coreg and dig was added by Dr. Andria Frames. Her blood pressure is too low to give her Coreg at this point. Will monitor in the hospital. Pacer was supposed to be checked today. We'll check in the hospital. Hasn't been checked since 2013.   Chronic diastolic congestive heart failure Patient is severely dehydrated. She's been off diuretics for over a week.   Anorexia As of last week patient had lost 32 pounds since last June. She has refused acute or drink. She has no appetite and poor memory. She lives with her son who works all day. Her daughter was staying with her over the weekend and is unable to care for her anymore. She will need to be placed in a nursing home.   Chronic kidney disease (CKD), stage IV (severe) Patient's BUN and creatinine were severely elevated last week see above dictation. We'll repeat in the hospital. Gentle IV fluids, renal consult.   PACEMAKER-St.Jude Pacer hasn't been checked since 2013. To be checked in the hospital.      Signed, Ermalinda Barrios, PA-C  05/31/2014 10:58 AM    [facility], [place], [place]   [address] Phone: [phone]; Fax: [phone]    ADDENDUM 05/31/2014: The patient is independently interviewed and examined. She is an 81 year old woman admitted from our office with pronounced failure to thrive. She has had minimal by mouth intake over the last several days. The patient has been noted to have progressive kidney disease with renal indices last week showing a creatinine of 5.95 and a BUN of 182. The patient is admitted to the hospital for further evaluation and management. Her daughter is at the bedside. The family can no longer take care of the patient and they have requested nursing home placement.  The patient denies chest pain or shortness of breath. She feels extremely weak. She is unable to walk now for several weeks.  On physical exam, she is a pleasant, elderly woman in no acute distress. Lung fields are clear. Heart is regular rate and rhythm without murmur or gallop. The patient's skin is very dry. There is no peripheral edema.  The patient's lab work is reviewed and her CBC has resulted. Her metabolic panel is still pending. EKG shows ventricular pacing at 60 bpm.  Please see above documentation for the patient's assessment and plan. I agree that she clearly requires hospitalization with a focus on goals of care. She has progressive and now end-stage kidney disease. I am not sure  that she will be a candidate for hemodialysis because of her very poor functional status and continued decline. As her cardiac problems appear stable and are not related to the reasons for hospitalization, I have requested that the family medicine service take over her care. I have spoken to the resident who has kindly agreed to accept her onto their service. Further plans as per the family medicine inpatient service. We would be happy to help as needed.  Sherren Mocha MD 05/31/2014 12:57 PM

## 2014-05-31 NOTE — H&P (Signed)
[name]: [phone]  Patient name: [name] Medical record number: [id number] Date of birth: January 09, 1931 Age: 81 y.o. Gender: female  Primary Care Provider: Zigmund Gottron, MD Consultants: Palliative Care Code Status: DNR  Chief Complaint: Anorexia, weakness  Assessment and Plan: [name] is a 81 y.o. female presenting with dehydration, decreased PO intake, and acute on chronic kidney disease. PMH is significant for HFpEF, PAH, CKD 4, CAD, HOCM, DDD, CVA, Afib, HLD, T2DM, and history of melanoma. Also with digoxin toxicity   Dehydration/FTT/Anorexia. Patient with ~30# weight loss in the past 9 months. Patient has no interest in eating or drinking. Has not taken diuretics for a week. No signs or symptoms of reversible etiologies (no dysphagia, dental pain, abdominal pain, chronic diarrhea, etc). Initially hypotensive to 82/42 however now hemodynamically stable.  - Admitted to telemetry under attending Dr McDiarmid - Gentle IVF rehydration - Palliative care consult to discuss goals of care - DNR, desires NH placement, declines aggressive measures including SDU, ICU transfer and dialysis.  - AM cortisol level - PT/OT consulted  Digoxin toxicity: Level of 4.2 on admission. Likely increased due to decreased renal function/dehydration. Not complaining any symptoms of toxicity, but has some electrolyte abnormalities consistent with toxicity. May be contributing to patient's overall weakness.   - Will give 2 vials of digifab - Dehydration contributing, and as pt is not anuric, we will give NS bolus and increase infusion rate overnight. Recheck in AM.  - recheck digoxin level tomorrow  AoCKD. Cr elevated to 5.72 on admission (baseline 2.7 to 3). Likely prerenal in the setting of dehydration - Pt very clear about not desiring to pursue RRT at this time - Gentle IVF as above - Continue to monitor - Will  order hydroxyzine prn uremic pruritus   Electrolyte Abnormalities. Hyperkalemic, hypochloremic. Likely mulitfactorial in setting of digoxin toxicity, dehydration, and AoCKD. - Continue to monitor - Hold kayexalate, no calcium as pt has PPM  Depression. Possibly contributing to weakness/malaise - Continue home cymbalta  HFpEF/PAH. EF 55-60% in 2014. Per PCP, dry weight is likely in mid 120s.  - Continue coreg - Hold diuresis in setting of severe dehydration  Afib. Recently changed from diltiazem to digoxin. Not an anticoagulation candidate due to history of GI bleed.  - Ventricular pacer in place with HR 60  HTN - Continue coreg - Add other home medications as indicated  T2DM Well controlled.  - Continue home lantus 5U daily - Will monitor glucose with BMP's in AM  HLD - Continue home atorvastatin  FEN/GI: Regular diet, D5NS @ 171ml/hr Prophylaxis: SubQ heparin  Disposition: Admitted to telemetry. Will likely need discharge to SNF.   History of Present Illness: [name] is a 81 y.o. female who presented as a direct admission from her cardiology clinic with dehydration, weakness, and acute on chronic renal failure. History is obtained via the patient and chart review.  Patient reports that she has no interest in drinking or eating for the past several months. States that she has no appetite. Denies any symptoms of dysphagia or abdominal pain. Per chart review, the patient weighed 151 pounds 9 month ago and is down to 119 pound today. Patient states that she takes all of her medications as prescribed and has only missed doses today. She had an appointment with her cardiologist last week and she was noted to have a worsening of her renal function (BUN 179. Cr 5.4).  Patient also expressed malaise and generalized weakness. States that it is difficult for her to get up to go to the bathroom. Denies any chest pain or dizziness. States that she has a mild increase in her shortness  of breath lately.   Patient states that she does not want to have dialysis and understands that dialysis could prolong her life and make her feel better. Patient also states that she would like to be placed in a nursing home and is not interested in returning to home.   Review Of Systems: Per HPI, otherwise 12 point review of systems was performed and was unremarkable.  Patient Active Problem List   Diagnosis Date Noted  . Acute renal failure 05/31/2014  . Dehydration 05/24/2014  . Anorexia 05/24/2014  . Dysuria 10/11/2013  . Right heart failure, NYHA class 3 10/01/2013  . Hypokalemia 10/01/2013  . Muscle weakness (generalized) 09/10/2013  . Unspecified constipation 05/18/2013  . Wide-complex tachycardia 02/19/2012  . Gastric ulcer 11/02/2011  . Coagulopathy 11/01/2011  . Chronic kidney disease (CKD), stage IV (severe) 07/20/2011  . GERD (gastroesophageal reflux disease) 04/24/2011  . Diastolic CHF, chronic 78/24/2353  . Encounter for end of life care 04/12/2010  . Chronic diastolic congestive heart failure 10/14/2008  . PACEMAKER-[employer]Jude 10/14/2008  . MACULAR DEGENERATION, SENILE 09/22/2008  . CERVICALGIA 07/29/2008  . DIVERTICULOSIS, COLON 12/18/2007  . DIABETIC PERIPHERAL NEUROPATHY 03/10/2007  . UNSPECIFIED HEARING LOSS 01/20/2007  . HX, PERSONAL, MALIGNANCY, SKIN MELANOMA 10/21/2006  . Anemia of chronic disease 06/13/2006  . DEGENERATION, LUMBAR/LUMBOSACRAL DISC 05/17/2006  . SCOLIOSIS, LUMBAR SPINE 05/17/2006  . Atrial fibrillation 04/18/2006  . Diabetes mellitus type 2 with complications 61/44/3154  . HYPERCHOLESTEROLEMIA 04/11/2006  . HYPERTRIGLYCERIDEMIA 04/11/2006  . DEPRESSIVE DISORDER, NOS 04/11/2006  . HYPERTENSION, BENIGN SYSTEMIC 04/11/2006  . MYOCARDIAL INFARCTION, OLD 04/11/2006  . CORONARY, ARTERIOSCLEROSIS 04/11/2006  . CVA 04/11/2006  . STASIS DERMATITIS 04/11/2006  . OSTEOARTHRITIS, MULTI SITES 04/11/2006  . INCONTINENCE/ENURESIS, NOS 04/11/2006    Past Medical History: Past Medical History  Diagnosis Date  . Macular hole of left eye     told will lose sight  . NICM (nonischemic cardiomyopathy)   . Atrial fibrillation     off anticoagulation presumed to anemia  . Coronary atherosclerosis     and old MI and hx of CVA   . Dysphagia   . DM (diabetes mellitus)     nueropathy  . HTN (hypertension)   . HLD (hyperlipidemia)   . Incontinence   . CHF (congestive heart failure)     related to dyastolic dysfunction  . CAD (coronary artery disease)     nonobstructive  . Scoliosis   . Repeated falls     hx  . Stasis dermatitis   . Hearing loss   . Anemia   . CHF (congestive heart failure)     cardiacc ath EF 25-30%  . Permanent atrial fibrillation   . Symptomatic bradycardia     sp PPM 1997  . Pacemaker     Dr. Olevia Perches; St. Jude VVI  . Heart murmur   . Anginal pain   . Myocardial infarction     "I've had 2" (09/02/2013)  . Pneumonia ~ 2003 X 1  . Neuropathy, diabetic   . History of blood transfusion     "I've had ~ 8 in my life; related to having children"  . Stroke     "I've had 2" (09/02/2013)  . Osteoarthritis     "left foot" (09/02/2013)  . Gout   .  Depression     "was taking RX; not depressed now" (09/02/2013)  . CRI (chronic renal insufficiency)     cr basleine 1.5-1.6  . Chronic renal failure, stage 4 (severe)     [name] 09/02/2013  . Melanoma     "inner right leg; left knee; left ankle"  . Acute renal failure 05/31/2014   Past Surgical History: Past Surgical History  Procedure Laterality Date  . Ankle fracture surgery Left   . Bladder suspension      "tucked up"  . Melanoma excision  X 3    "inner right leg; left knee; left ankle"  . Appendectomy    . Esophagogastroduodenoscopy  11/02/2011    Procedure: ESOPHAGOGASTRODUODENOSCOPY (EGD);  Surgeon: Irene Shipper, MD;  Location: [facility] ENDOSCOPY;  Service: Endoscopy;  Laterality: N/A;  . Insert / replace / remove pacemaker  09/27/1995, 04/27/2005    SJM  pacemaker implanted by Dr Olevia Perches, generator change 04/27/05  . Tonsillectomy    . Total abdominal hysterectomy    . Tubal ligation    . Cataract extraction w/ intraocular lens  implant, bilateral Bilateral   . Pars plana vitrectomy w/ repair of macular hole Left   . Cardiac catheterization    . Right heart catheterization N/A 02/21/2012    Procedure: RIGHT HEART CATH;  Surgeon: Jolaine Artist, MD;  Location: [facility] CATH LAB;  Service: Cardiovascular;  Laterality: N/A;   Social History: History  Substance Use Topics  . Smoking status: Never Smoker   . Smokeless tobacco: Never Used  . Alcohol Use: No   Please also refer to relevant sections of EMR.  Family History: Family History  Problem Relation Age of Onset  . Heart attack      family hx  . Colon cancer      family hx  . Stroke      family hx  . Diabetes      DM - family hx   . COPD Mother    Allergies and Medications: Allergies  Allergen Reactions  . Paroxetine Other (See Comments)    hallucinations and falls  . Codeine Phosphate Rash  . Hydrocodone Itching, Nausea Only and Rash  . Tramadol Hcl Hives and Other (See Comments)        No current facility-administered medications on file prior to encounter.   Current Outpatient Prescriptions on File Prior to Encounter  Medication Sig Dispense Refill  . atorvastatin (LIPITOR) 10 MG tablet Take 10 mg by mouth every morning.    . digoxin (LANOXIN) 0.125 MG tablet Take 0.5 tablets (0.0625 mg total) by mouth daily. 45 tablet 3  . DULoxetine (CYMBALTA) 30 MG capsule Take 1 capsule (30 mg total) by mouth daily. 90 capsule 3  . ferrous sulfate 325 (65 FE) MG tablet Take 325 mg by mouth 2 (two) times daily with a meal.    . Insulin Glargine (LANTUS SOLOSTAR) 100 UNIT/ML Solostar Pen Inject 5 Units into the skin daily at 10 pm. 15 mL   . pantoprazole (PROTONIX) 40 MG tablet Take 1 tablet (40 mg total) by mouth daily at 6 (six) AM. 90 tablet 3  . polyethylene glycol (MIRALAX /  GLYCOLAX) packet Take 17 g by mouth daily as needed for moderate constipation.    . potassium chloride (K-DUR) 10 MEQ tablet Take 1 tablet (10 mEq total) by mouth 2 (two) times daily. 180 tablet 3  . carvedilol (COREG) 12.5 MG tablet TAKE ONE TABLET BY MOUTH TWICE DAILY WITH MEALS (Patient not taking:  Reported on 05/31/2014) 180 tablet 3  . torsemide (DEMADEX) 100 MG tablet Take 0.5 tablets (50 mg total) by mouth 2 (two) times daily. (Patient not taking: Reported on 05/31/2014) 60 tablet 11    Objective: BP 129/85 mmHg  Pulse 98  Temp(Src) 97.5 F (36.4 C) (Oral)  Resp 18  Ht 5\' 5"  (1.651 m)  Wt 117 lb 1 oz (53.1 kg)  BMI 19.48 kg/m2  SpO2 96% Exam: General: 81 year old, cachetic female in NAD lying in hospital bed HEENT: EOMI, dry appearing mucus membranes Cardiovascular: RR, no murmurs appreciated Respiratory: Nasal cannula in place, NWOB on 3L by [place], CTAB with no wheezes or crackles Abdomen: +BS, soft, nondistended, nontender Extremities: Chronic venous stasis changes noted in LE, no edema Skin: No rashes or areas of breakdown Neuro: Alert and conversational, no focal neurologic deficits  Labs and Imaging: CBC BMET   Recent Labs Lab 05/31/14 1216  WBC 7.1  HGB 9.0*  HCT 26.8*  PLT 130*    Recent Labs Lab 05/31/14 1216  NA 137  K 5.7*  CL 95*  CO2 25  BUN 174*  CREATININE 5.72*  GLUCOSE 138*  CALCIUM 9.8     BNP: 502.6 TSH 1.406 Digoxin level 4.2  EKG: Ventricular paced rhythm  Vivi Barrack, MD 05/31/2014, 3:18 PM PGY-1, [place] Intern [name]: [phone], text pages welcome   I have seen and evaluated the patient with Dr. Jerline Pain. I am in agreement with the note above in its revised form. My additions are in red.  Caitlain Tweed B. Bonner Puna, MD, PGY-2 05/31/2014 4:42 PM

## 2014-05-31 NOTE — Assessment & Plan Note (Signed)
Patient is severely dehydrated. She's had significant weight loss and is refusing to eat or drink. Her diuretics have been held for over a week. We could not get a weight on her today because she is too unstable. She is hypotensive and in renal failure. Admit to hospital for IV fluids, hospitalist consult, renal consult, and social workers will need to see to help with nursing home placement. And is a no CODE BLUE

## 2014-05-31 NOTE — Progress Notes (Signed)
Patient admitted to [facility] per Ermalinda Barrios, PA. Device interrogation was not performed. Industry contacted for hospital interrogation.

## 2014-05-31 NOTE — Assessment & Plan Note (Signed)
Pacer hasn't been checked since 2013. To be checked in the hospital.

## 2014-06-01 DIAGNOSIS — M109 Gout, unspecified: Secondary | ICD-10-CM | POA: Diagnosis present

## 2014-06-01 DIAGNOSIS — E781 Pure hyperglyceridemia: Secondary | ICD-10-CM | POA: Diagnosis present

## 2014-06-01 DIAGNOSIS — K219 Gastro-esophageal reflux disease without esophagitis: Secondary | ICD-10-CM | POA: Diagnosis present

## 2014-06-01 DIAGNOSIS — Z66 Do not resuscitate: Secondary | ICD-10-CM | POA: Diagnosis present

## 2014-06-01 DIAGNOSIS — G9340 Encephalopathy, unspecified: Secondary | ICD-10-CM | POA: Diagnosis present

## 2014-06-01 DIAGNOSIS — H9190 Unspecified hearing loss, unspecified ear: Secondary | ICD-10-CM | POA: Diagnosis present

## 2014-06-01 DIAGNOSIS — T460X5A Adverse effect of cardiac-stimulant glycosides and drugs of similar action, initial encounter: Secondary | ICD-10-CM | POA: Diagnosis present

## 2014-06-01 DIAGNOSIS — I25119 Atherosclerotic heart disease of native coronary artery with unspecified angina pectoris: Secondary | ICD-10-CM | POA: Diagnosis present

## 2014-06-01 DIAGNOSIS — I252 Old myocardial infarction: Secondary | ICD-10-CM | POA: Diagnosis not present

## 2014-06-01 DIAGNOSIS — Z9842 Cataract extraction status, left eye: Secondary | ICD-10-CM | POA: Diagnosis not present

## 2014-06-01 DIAGNOSIS — T460X5D Adverse effect of cardiac-stimulant glycosides and drugs of similar action, subsequent encounter: Secondary | ICD-10-CM | POA: Diagnosis not present

## 2014-06-01 DIAGNOSIS — R630 Anorexia: Secondary | ICD-10-CM | POA: Diagnosis not present

## 2014-06-01 DIAGNOSIS — Z8673 Personal history of transient ischemic attack (TIA), and cerebral infarction without residual deficits: Secondary | ICD-10-CM | POA: Diagnosis not present

## 2014-06-01 DIAGNOSIS — Z886 Allergy status to analgesic agent status: Secondary | ICD-10-CM | POA: Diagnosis not present

## 2014-06-01 DIAGNOSIS — E878 Other disorders of electrolyte and fluid balance, not elsewhere classified: Secondary | ICD-10-CM | POA: Diagnosis present

## 2014-06-01 DIAGNOSIS — N189 Chronic kidney disease, unspecified: Secondary | ICD-10-CM | POA: Diagnosis not present

## 2014-06-01 DIAGNOSIS — Z888 Allergy status to other drugs, medicaments and biological substances status: Secondary | ICD-10-CM | POA: Diagnosis not present

## 2014-06-01 DIAGNOSIS — E861 Hypovolemia: Secondary | ICD-10-CM | POA: Diagnosis not present

## 2014-06-01 DIAGNOSIS — I872 Venous insufficiency (chronic) (peripheral): Secondary | ICD-10-CM | POA: Diagnosis present

## 2014-06-01 DIAGNOSIS — D638 Anemia in other chronic diseases classified elsewhere: Secondary | ICD-10-CM | POA: Diagnosis present

## 2014-06-01 DIAGNOSIS — Z85820 Personal history of malignant melanoma of skin: Secondary | ICD-10-CM | POA: Diagnosis not present

## 2014-06-01 DIAGNOSIS — F329 Major depressive disorder, single episode, unspecified: Secondary | ICD-10-CM | POA: Diagnosis not present

## 2014-06-01 DIAGNOSIS — R300 Dysuria: Secondary | ICD-10-CM | POA: Diagnosis not present

## 2014-06-01 DIAGNOSIS — N19 Unspecified kidney failure: Secondary | ICD-10-CM | POA: Insufficient documentation

## 2014-06-01 DIAGNOSIS — I129 Hypertensive chronic kidney disease with stage 1 through stage 4 chronic kidney disease, or unspecified chronic kidney disease: Secondary | ICD-10-CM | POA: Diagnosis not present

## 2014-06-01 DIAGNOSIS — Z515 Encounter for palliative care: Secondary | ICD-10-CM | POA: Diagnosis not present

## 2014-06-01 DIAGNOSIS — L299 Pruritus, unspecified: Secondary | ICD-10-CM | POA: Diagnosis not present

## 2014-06-01 DIAGNOSIS — E860 Dehydration: Secondary | ICD-10-CM | POA: Diagnosis present

## 2014-06-01 DIAGNOSIS — I5032 Chronic diastolic (congestive) heart failure: Secondary | ICD-10-CM | POA: Diagnosis present

## 2014-06-01 DIAGNOSIS — Z9841 Cataract extraction status, right eye: Secondary | ICD-10-CM | POA: Diagnosis not present

## 2014-06-01 DIAGNOSIS — R531 Weakness: Secondary | ICD-10-CM | POA: Diagnosis present

## 2014-06-01 DIAGNOSIS — E1140 Type 2 diabetes mellitus with diabetic neuropathy, unspecified: Secondary | ICD-10-CM | POA: Diagnosis not present

## 2014-06-01 DIAGNOSIS — T460X1D Poisoning by cardiac-stimulant glycosides and drugs of similar action, accidental (unintentional), subsequent encounter: Secondary | ICD-10-CM | POA: Diagnosis not present

## 2014-06-01 DIAGNOSIS — R627 Adult failure to thrive: Secondary | ICD-10-CM | POA: Diagnosis not present

## 2014-06-01 DIAGNOSIS — Z961 Presence of intraocular lens: Secondary | ICD-10-CM | POA: Diagnosis present

## 2014-06-01 DIAGNOSIS — Z681 Body mass index (BMI) 19 or less, adult: Secondary | ICD-10-CM | POA: Diagnosis not present

## 2014-06-01 DIAGNOSIS — R634 Abnormal weight loss: Secondary | ICD-10-CM | POA: Diagnosis present

## 2014-06-01 DIAGNOSIS — D649 Anemia, unspecified: Secondary | ICD-10-CM | POA: Diagnosis not present

## 2014-06-01 DIAGNOSIS — I421 Obstructive hypertrophic cardiomyopathy: Secondary | ICD-10-CM | POA: Diagnosis present

## 2014-06-01 DIAGNOSIS — E1142 Type 2 diabetes mellitus with diabetic polyneuropathy: Secondary | ICD-10-CM | POA: Diagnosis present

## 2014-06-01 DIAGNOSIS — I482 Chronic atrial fibrillation: Secondary | ICD-10-CM | POA: Diagnosis present

## 2014-06-01 DIAGNOSIS — N179 Acute kidney failure, unspecified: Secondary | ICD-10-CM | POA: Diagnosis present

## 2014-06-01 DIAGNOSIS — Z794 Long term (current) use of insulin: Secondary | ICD-10-CM | POA: Diagnosis not present

## 2014-06-01 DIAGNOSIS — E780 Pure hypercholesterolemia: Secondary | ICD-10-CM | POA: Diagnosis present

## 2014-06-01 DIAGNOSIS — M1990 Unspecified osteoarthritis, unspecified site: Secondary | ICD-10-CM | POA: Diagnosis present

## 2014-06-01 DIAGNOSIS — L298 Other pruritus: Secondary | ICD-10-CM | POA: Diagnosis present

## 2014-06-01 DIAGNOSIS — J9610 Chronic respiratory failure, unspecified whether with hypoxia or hypercapnia: Secondary | ICD-10-CM | POA: Diagnosis present

## 2014-06-01 DIAGNOSIS — N184 Chronic kidney disease, stage 4 (severe): Secondary | ICD-10-CM | POA: Diagnosis not present

## 2014-06-01 DIAGNOSIS — I4891 Unspecified atrial fibrillation: Secondary | ICD-10-CM | POA: Diagnosis not present

## 2014-06-01 DIAGNOSIS — Z9981 Dependence on supplemental oxygen: Secondary | ICD-10-CM | POA: Diagnosis not present

## 2014-06-01 DIAGNOSIS — E875 Hyperkalemia: Secondary | ICD-10-CM | POA: Diagnosis present

## 2014-06-01 DIAGNOSIS — Z90710 Acquired absence of both cervix and uterus: Secondary | ICD-10-CM | POA: Diagnosis not present

## 2014-06-01 DIAGNOSIS — E785 Hyperlipidemia, unspecified: Secondary | ICD-10-CM | POA: Diagnosis not present

## 2014-06-01 LAB — DIGOXIN LEVEL: DIGOXIN LVL: 7 ng/mL — AB (ref 0.8–2.0)

## 2014-06-01 LAB — BASIC METABOLIC PANEL
ANION GAP: 11 (ref 5–15)
BUN: 147 mg/dL — ABNORMAL HIGH (ref 6–23)
CALCIUM: 8.6 mg/dL (ref 8.4–10.5)
CO2: 25 mmol/L (ref 19–32)
Chloride: 105 mmol/L (ref 96–112)
Creatinine, Ser: 4.75 mg/dL — ABNORMAL HIGH (ref 0.50–1.10)
GFR calc Af Amer: 9 mL/min — ABNORMAL LOW (ref >90)
GFR, EST NON AFRICAN AMERICAN: 8 mL/min — AB (ref >90)
Glucose, Bld: 168 mg/dL — ABNORMAL HIGH (ref 70–99)
Potassium: 4 mmol/L (ref 3.5–5.1)
SODIUM: 141 mmol/L (ref 135–145)

## 2014-06-01 LAB — MAGNESIUM: Magnesium: 1.8 mg/dL (ref 1.5–2.5)

## 2014-06-01 LAB — CBC
HCT: 20.9 % — ABNORMAL LOW (ref 36.0–46.0)
Hemoglobin: 7 g/dL — ABNORMAL LOW (ref 12.0–15.0)
MCH: 31.5 pg (ref 26.0–34.0)
MCHC: 33.5 g/dL (ref 30.0–36.0)
MCV: 94.1 fL (ref 78.0–100.0)
Platelets: 82 10*3/uL — ABNORMAL LOW (ref 150–400)
RBC: 2.22 MIL/uL — AB (ref 3.87–5.11)
RDW: 14.9 % (ref 11.5–15.5)
WBC: 4.8 10*3/uL (ref 4.0–10.5)

## 2014-06-01 LAB — GLUCOSE, CAPILLARY
GLUCOSE-CAPILLARY: 226 mg/dL — AB (ref 70–99)
Glucose-Capillary: 159 mg/dL — ABNORMAL HIGH (ref 70–99)
Glucose-Capillary: 174 mg/dL — ABNORMAL HIGH (ref 70–99)
Glucose-Capillary: 130 mg/dL — ABNORMAL HIGH (ref 70–99)

## 2014-06-01 LAB — PHOSPHORUS: PHOSPHORUS: 5.7 mg/dL — AB (ref 2.3–4.6)

## 2014-06-01 MED ORDER — POLYETHYLENE GLYCOL 3350 17 G PO PACK
17.0000 g | PACK | Freq: Every day | ORAL | Status: DC
  Administered 2014-06-01 – 2014-06-02 (×2): 17 g via ORAL
  Filled 2014-06-01 (×2): qty 1

## 2014-06-01 MED ORDER — ENSURE ENLIVE PO LIQD
237.0000 mL | Freq: Two times a day (BID) | ORAL | Status: DC
  Administered 2014-06-01 – 2014-06-02 (×3): 237 mL via ORAL

## 2014-06-01 MED ORDER — CAMPHOR-MENTHOL 0.5-0.5 % EX LOTN
TOPICAL_LOTION | Freq: Two times a day (BID) | CUTANEOUS | Status: DC
  Administered 2014-06-01 – 2014-06-02 (×3): via TOPICAL
  Filled 2014-06-01: qty 222

## 2014-06-01 MED ORDER — LORATADINE 10 MG PO TABS
10.0000 mg | ORAL_TABLET | ORAL | Status: DC
  Administered 2014-06-01: 10 mg via ORAL
  Filled 2014-06-01: qty 1

## 2014-06-01 NOTE — Evaluation (Signed)
Occupational Therapy Evaluation Patient Details Name: [name] MRN: [id number] DOB: 11-04-1930 Today's Date: 06/01/2014    History of Present Illness 81 yo female with PMHx of HOCM, HFpEF, AFib (not on anticaog 2/2 GI Bleed history), CKD who was admitted from cardiology office today with weakness, decreased mobility, little to no PO intake over the past few weeks. Also had recent fall at home. Family reportedly feels like they can no longer take care of her. [name] today states that things going poorly at home and she knows she has issues with her heart and kidneys. Appears upset at how things have been going at home but hard time elaborating for me today. With her dehydration and decreased PO intake, had her diuretics held last week. AKI on CKD here. Increased dig level. FTT and patient/family seeking nursing home placement.   Clinical Impression   Pt admitted with the above diagnosis and has the deficits listed below. Pt would benefit from a trial of cont OT to attempt to increase independence with basic adl and adls transfers.  Pt states she was able to complete most adls 6 weeks ago but over last few weeks has become weak and unable to take care of herself while at home alone. In her current status, pt should not be d/c'd home alone due to weakness and increased fall risk.  If she is unable to go to SNF, she should haven strict 24 hour S and HHOT.     Follow Up Recommendations  SNF;Supervision/Assistance - 24 hour    Equipment Recommendations  3 in 1 bedside comode;Other (comment) (if she does not already have it.  She cannot recall.)    Recommendations for Other Services       Precautions / Restrictions Precautions Precautions: Fall Precaution Comments: recent fall at home. Restrictions Weight Bearing Restrictions: No      Mobility Bed Mobility Overal bed mobility: Needs Assistance Bed Mobility: Supine to Sit     Supine to sit: Mod assist     General bed mobility  comments: mod handheld assist to pull to sit; pt aboe to move bil LEs off of EOB without assist; Used bed pad to assist pt to square off hips at EOB  Transfers Overall transfer level: Needs assistance Equipment used: 1 person hand held assist Transfers: Sit to/from [facility] Sit to Stand: Min assist Stand pivot transfers: Mod assist       General transfer comment: Pt required mod assist to transfer to [facility].  Pt unable to hold her urine during transfer.    Balance Overall balance assessment: Needs assistance Sitting-balance support: Bilateral upper extremity supported;Feet supported Sitting balance-Leahy Scale: Fair     Standing balance support: During functional activity;Bilateral upper extremity supported Standing balance-Leahy Scale: Poor Standing balance comment: Pt requires outside support to stand.                            ADL Overall ADL's : Needs assistance/impaired Eating/Feeding: Set up;Sitting (often chooses not to eat.) Eating/Feeding Details (indicate cue type and reason): Pt can feed self but often chooses not to eat. Grooming: Wash/dry hands;Wash/dry face;Oral care;Set up;Sitting   Upper Body Bathing: Set up;Sitting Upper Body Bathing Details (indicate cue type and reason): encouragement to participate but would when encouraged to do so and did not need assist. Lower Body Bathing: Moderate assistance;Sit to/from stand Lower Body Bathing Details (indicate cue type and reason): Again, pt needs encouragment to do for  herself but can when encouraged. Pt unstead on her feet when standing so needs mod assist to bathe bottom and peri area.  Pt is able to cross legs up and bathe feet without assist. Upper Body Dressing : Minimal assistance;Sitting Upper Body Dressing Details (indicate cue type and reason): min assist getting a shirt over her head. Lower Body Dressing: Moderate assistance;Sit to/from stand Lower Body Dressing Details (indicate  cue type and reason): mod assist to maintain standing while pulling up pants. pt does well with adls in sitting but needs a great amount of assist in standing. pt is a fall risk if she is to d/c home w/o proper assist. Toilet Transfer: Moderate assistance;BSC;Stand-pivot Toilet Transfer Details (indicate cue type and reason): assist with pivot transfers. pt unable to hold urine long enough to get on the [facility]. Toileting- Clothing Manipulation and Hygiene: Minimal assistance;Sit to/from stand Toileting - Clothing Manipulation Details (indicate cue type and reason): Pt cannot stand and clean self without assist for balance.     Functional mobility during ADLs: Moderate assistance;Cueing for safety General ADL Comments: Pt requires mod assist and sometimes min assist depending on task to do adls. Pt requires max encouragement to participate in those adls. Feel pt has some significant depression that is affecting her ability to be safe with adls at home alone.     Vision Additional Comments: vision unchanged.   Perception     Praxis      Pertinent Vitals/Pain Pain Assessment: No/denies pain     Hand Dominance Right   Extremity/Trunk Assessment Upper Extremity Assessment Upper Extremity Assessment: Generalized weakness   Lower Extremity Assessment Lower Extremity Assessment: Defer to PT evaluation   Cervical / Trunk Assessment Cervical / Trunk Assessment: Kyphotic   Communication Communication Communication: No difficulties   Cognition Arousal/Alertness: Awake/alert Behavior During Therapy: Flat affect Overall Cognitive Status: Difficult to assess                     General Comments       Exercises       Shoulder Instructions      Home Living Family/patient expects to be discharged to:: Private residence Living Arrangements: Alone Available Help at Discharge: Available PRN/intermittently Type of Home: Mobile home Home Access: Stairs to enter Engineer, site of Steps: 3 Entrance Stairs-Rails: Right;Left Home Layout: One level     Bathroom Shower/Tub: Other (comment) (sponge bathes only.)   Bathroom Toilet: Standard     Home Equipment: Walker - 2 wheels;Cane - single point;Wheelchair - manual   Additional Comments: Pt unsure if she had a 3:1 at home but needs one if she does not and does return home.      Prior Functioning/Environment Level of Independence: Needs assistance  Gait / Transfers Assistance Needed: Pt walked in home short distances with walker.  pt had had falls in past. ADL's / Homemaking Assistance Needed: Pts family cooked for her or she would cook small things like oatmeal.  Family does all housekeeping.   Comments: Pt states she is able to do some of her adls and walk in the house but at times just does not have the energy to do so.  Pt states she is alone most of the day and worries about falling therefore will choose not to get up a good part of the day.  Pt feels unsafe there alone.    OT Diagnosis: Generalized weakness;Cognitive deficits;Disturbance of vision   OT Problem List: Decreased strength;Decreased activity tolerance;Impaired balance (  sitting and/or standing);Impaired vision/perception;Decreased cognition;Decreased safety awareness;Decreased knowledge of use of DME or AE;Decreased knowledge of precautions   OT Treatment/Interventions: Self-care/ADL training;DME and/or AE instruction;Therapeutic activities    OT Goals(Current goals can be found in the care plan section) Acute Rehab OT Goals Patient Stated Goal: "I don't want to go home." OT Goal Formulation: Patient unable to participate in goal setting Time For Goal Achievement: 06/15/14 Potential to Achieve Goals: Good ADL Goals Pt Will Perform Grooming: with supervision;standing Pt Will Perform Lower Body Bathing: with supervision;sit to/from stand Pt Will Perform Lower Body Dressing: with supervision;sit to/from stand Additional ADL Goal  #1: Pt will toilet on 3:1 over commode with S.  OT Frequency: Min 2X/week   Barriers to D/C: Decreased caregiver support  pt is alone most of the day.       Co-evaluation              End of Session Equipment Utilized During Treatment: Rolling walker;Oxygen Nurse Communication: Mobility status  Activity Tolerance: Patient tolerated treatment well Patient left: in chair;with call bell/phone within reach;with chair alarm set   Time: 1015-1043 OT Time Calculation (min): 28 min Charges:  OT General Charges $OT Visit: 1 Procedure OT Evaluation $Initial OT Evaluation Tier I: 1 Procedure OT Treatments $Self Care/Home Management : 8-22 mins G-Codes: OT G-codes **NOT FOR INPATIENT CLASS** Functional Assessment Tool Used: clinical judgement Functional Limitation: Self care Self Care Current Status (E3329): At least 20 percent but less than 40 percent impaired, limited or restricted Self Care Goal Status (J1884): At least 1 percent but less than 20 percent impaired, limited or restricted  Glenford Peers 06/01/2014, 11:13 AM  [phone]

## 2014-06-01 NOTE — Progress Notes (Signed)
UR completed 

## 2014-06-01 NOTE — Progress Notes (Signed)
°   06/01/14 0909  Clinical Encounter Type  Visited With Patient  Visit Type Initial;Spiritual support;Patient actively dying  Referral From Nurse  Spiritual Encounters  Spiritual Needs Prayer;Emotional  Stress Factors  Patient Stress Factors Exhausted;Health changes;Loss  Family Stress Factors None identified  Advance Directives (For Healthcare)  Does patient have an advance directive? Yes  Type of Paramedic of [place];Living will   Patient was alert and sitting up in the bedside chair with the blinds open so the room was filled with sunlight. Patient stated she had a tooth that was bothering her but there was nothing they could do as she was too old. Patient stated she was sick and had been for sometime and is waiting. Patient stated that she hadn't been in church in a while since she has been sick; the church does send visitors. We spoke about her relationship with the [employer] and her love of old [place]. She would like to hear some while in the hospital if possible. I told the patient I would see what I could find to accommodate her request. Patient is dying and is at peace with the process.   Drue Dun, Chaplain Intern 06/01/2014, 9:30 AM

## 2014-06-01 NOTE — Clinical Social Work Placement (Addendum)
Clinical Social Work Department CLINICAL SOCIAL WORK PLACEMENT NOTE 06/01/2014  Patient:  [name], [name]  Account Number:  [id number] Admit date:  05/31/2014  Clinical Social Worker:  Gwenevere Abbot,  Date/time:  06/01/2014 04:30 PM  Clinical Social Work is seeking post-discharge placement for this patient at the following level of care:   SKILLED NURSING   (*CSW will update this form in Epic as items are completed)   06/01/2014  Patient/family provided with [place] Department of Clinical Social Work's list of facilities offering this level of care within the geographic area requested by the patient (or if unable, by the patient's family).  06/01/2014  Patient/family informed of their freedom to choose among providers that offer the needed level of care, that participate in Medicare, Medicaid or managed care program needed by the patient, have an available bed and are willing to accept the patient.  06/01/2014  Patient/family informed of [facility]' ownership interest in [facility], as well as of the fact that they are under no obligation to receive care at this facility.  PASARR submitted to EDS on 06/02/14 PASARR number received on 06/02/14  FL2 transmitted to all facilities in geographic area requested by pt/family on  06/01/2014 FL2 transmitted to all facilities within larger geographic area on   Patient informed that his/her managed care company has contracts with or will negotiate with  certain facilities, including the following:     Patient/family informed of bed offers received:    Patient chooses bed at [facility] Physician recommends and patient chooses bed at    Patient to be transferred to [facility] on 06/02/14 Patient to be transferred to facility by ambulance Patient and family notified of transfer on 06/02/14 Name of family member notified: Daughter Quetzali Heinle [phone])   The following physician request were entered in Epic:   Additional  Comments: 06/02/14: Ms. Timoteo Expose informed CSW that she will go to facility on Thursday morning to complete admissions paperwork for her mother.      Gwenevere Abbot CSW-Intern [phone]

## 2014-06-01 NOTE — Care Management Note (Signed)
CARE MANAGEMENT NOTE 06/01/2014  Patient:  FEY, COGHILL   Account Number:  [id number]  Date Initiated:  06/01/2014  Documentation initiated by:  Dafna Romo  Subjective/Objective Assessment:   CM following for progression and d/c planning.     Action/Plan:   Noted consult for [facility] services, informed by pt RN that pt family is interested in SNF placement. CSW aware and following this pt as well.   Anticipated DC Date:  06/04/2014   Anticipated DC Plan:  SKILLED NURSING FACILITY         Choice offered to / List presented to:             Status of service:  In process, will continue to follow Medicare Important Message given?   (If response is "NO", the following Medicare IM given date fields will be blank) Date Medicare IM given:   Medicare IM given by:   Date Additional Medicare IM given:   Additional Medicare IM given by:    Discharge Disposition:    Per UR Regulation:    If discussed at Long Length of Stay Meetings, dates discussed:    Comments:

## 2014-06-01 NOTE — Discharge Summary (Signed)
[facility] Discharge Summary  Patient name: REEYA [name] Medical record number: [id number] Date of birth: [birth year] Age: 81 y.o. Gender: female Date of Admission: 05/31/2014  Date of Discharge: 06/02/14 Admitting Physician: No admitting provider for patient encounter.  Primary Care Provider: Zigmund Gottron, MD Consultants: Palliative care  Indication for Hospitalization: Dehydration, Failure to Thrive  Discharge Diagnoses/Problem List:  Failure to thrive, digoxin toxicity, CKD, depression, HFpEF, PAH, Afib, HTN, T2DM, HLD  Disposition: SNF  Discharge Condition: Stable  Discharge Exam: Please see progress note from 06/02/14  Brief Hospital Course:  [name] is an 81 year old woman who presented as a direct admission from her cardiology clinic with dehydration, decreased PO intake, acute on chronic kidney disease, and digoxin toxicity. Her PMH is significant for HFpEF, PAH, CKD 4, CAD, HOCM, DDD, CVA, Afib, HLD, T2DM, and history of melanoma. Her hospital course by problem is outlined below:  Dehydration/FTT/Anorexia. Patient was noted to have a 30 pound weight loss over the 9 months leading up to admission. Per the patient's PCP, her dry weight should be in the mid 120 range. She had no signs or symptoms of reversible etiologies (no dysphagia, abdominal pain, chronic diarrhea, etc) for decreased PO intake, she stated that she just was not interested in eating or drinking. We started gentle IV hydration and consulted our palliative care team to discuss goals of care. Patient stated that she no longer wanted to return home and wished to go to a nursing home. The patient was adequately fluid resuscitated and discharged to SNF.  Digoxin Toxicity. Patient was noted to have a level of 4.2 on admission, likely due to decreased renal function and dehydration. She did not have any symptoms of toxicity, however did have electrolyte abnormalities consistent with  toxicity. We gave her 2 vials of digifab. Repeat total digoxin level was increased to 7 (due to cross reactivity with digifab). She will have follow up testing as an outpatient.   AoCKD. Patient presented with an elevated Cr of 5.72 on admission (baseline 2.7 to 3), most likely due to dehydration. Patient stated that she was not interested in having dialysis. Her creatinine improved with gentle IV hydration and was 4.27 at the time of discharge. We started claritin and Sarna for uremic pruritis.   Afib. Patient with ventricular paced rhythm at 60bpm. We discontinued digoxin given toxicity as listed above. We also discontinued the patient's cartia to avoid negative inotropic effects in setting of CHF. Patient is not an anticoagulation candidate given her history of GI bleed. Patient was adequately controlled with coreg while here.   HTN. As mentioned above, we discontinued the patient's cartia. We also stopped her HCTZ and torsemide in the setting of severe dehydration.   Dysuria. Patient noted dysuria for approximately 3 weeks prior to admission. Patient did not have any fever or leukocytosis. Urine for urinalysis not collected prior to discharge; will need urinalysis at SNF.  The patient's other chronic medical conditions were stable during this admission and no changes were made to her medications.   Issues for Follow Up:  1. Patient discharged to SNF - would benefit from ongoing contact with outpatient palliative care to discuss goals of care. Currently does not want HD or aggressive measures. 2. Consider checking total digoxin level approximately 1 week after administration of digifab (05/31/2014). We considered obtaining free digoxin levels, however this would take over a week to result. Digoxin was discontinued at discharge. Would also consider repeat BMP to  follow up potassium levels (can be elevated with digoxin toxicity) 3. F/u BP and fluid status - We held the patient's Cartia (due to  negative inotropic effects), torsemide, and HCTZ at discharge. Start back as indicated.  4. Dysuria x3 weeks prior to admission. Will need Urinalysis at SNF. Treat if McGeer's criteria are met.   Significant Procedures: None  Significant Labs and Imaging:   Recent Labs Lab 05/31/14 1216 06/01/14 0705  WBC 7.1 4.8  HGB 9.0* 7.0*  HCT 26.8* 20.9*  PLT 130* 82*    Recent Labs Lab 05/31/14 1216 06/01/14 0705 06/02/14 0525  NA 137 141 141  K 5.7* 4.0 3.9  CL 95* 105 106  CO2 25 25 22   GLUCOSE 138* 168* 128*  BUN 174* 147* 126*  CREATININE 5.72* 4.75* 4.27*  CALCIUM 9.8 8.6 8.9  MG 2.1 1.8  --   PHOS  --  5.7*  --   ALKPHOS 71  --   --   AST 20  --   --   ALT 16  --   --   ALBUMIN 3.4*  --   --    BNP: 502.6 TSH 1.406 Digoxin level 4.2 >7.0  EKG: Ventricular paced rhythm @ 60bpm  Results/Tests Pending at Time of Discharge: None  Discharge Medications:    Medication List    STOP taking these medications        CARTIA XT 240 MG 24 hr capsule  Generic drug:  diltiazem     digoxin 0.125 MG tablet  Commonly known as:  LANOXIN     hydrochlorothiazide 25 MG tablet  Commonly known as:  HYDRODIURIL     potassium chloride 10 MEQ tablet  Commonly known as:  K-DUR     torsemide 100 MG tablet  Commonly known as:  DEMADEX      TAKE these medications        atorvastatin 10 MG tablet  Commonly known as:  LIPITOR  Take 10 mg by mouth every morning.     carvedilol 25 MG tablet  Commonly known as:  COREG  Take 25 mg by mouth 2 (two) times daily.     DULoxetine 30 MG capsule  Commonly known as:  CYMBALTA  Take 1 capsule (30 mg total) by mouth daily.     feeding supplement (ENSURE ENLIVE) Liqd  Take 237 mLs by mouth 2 (two) times daily between meals.     ferrous sulfate 325 (65 FE) MG tablet  Take 325 mg by mouth 2 (two) times daily with a meal.     Insulin Glargine 100 UNIT/ML Solostar Pen  Commonly known as:  LANTUS SOLOSTAR  Inject 5 Units into the  skin daily at 10 pm.     loratadine 10 MG tablet  Commonly known as:  CLARITIN  Take 1 tablet (10 mg total) by mouth every other day.     pantoprazole 40 MG tablet  Commonly known as:  PROTONIX  Take 1 tablet (40 mg total) by mouth daily at 6 (six) AM.     polyethylene glycol packet  Commonly known as:  MIRALAX / GLYCOLAX  Take 17 g by mouth daily as needed for moderate constipation.        Discharge Instructions: Please refer to Patient Instructions section of EMR for full details.  Patient was counseled important signs and symptoms that should prompt return to medical care, changes in medications, dietary instructions, activity restrictions, and follow up appointments.   Follow-Up Appointments: Follow-up Information  Schedule an appointment as soon as possible for a visit with Zigmund Gottron, MD.   Specialty:  Family Medicine   Contact information:   [place] [place] [address] [phone]       Schedule an appointment as soon as possible for a visit with Hospice at [facility].   Specialty:  Hospice and Palliative Medicine   Contact information:   [place] [address] [place], [place] 06/02/2014, 2:35 PM PGY-1, [place]

## 2014-06-01 NOTE — Clinical Social Work Psychosocial (Signed)
Clinical Social Work Department BRIEF PSYCHOSOCIAL ASSESSMENT 06/01/2014  Patient:  [name], [name]     Account Number:  [id number]     Admit date:  05/31/2014  Clinical Social Worker:  Gwenevere Abbot,  Date/Time:  06/01/2014 02:58 PM  Referred by:  Physician  Date Referred:   Referred for  SNF Placement   Other Referral:   Interview type:  Patient Other interview type:    PSYCHOSOCIAL DATA Living Status:  ALONE Admitted from facility:   Level of care:   Primary support name:  Lubna Stegeman Primary support relationship to patient:  CHILD, ADULT Degree of support available:   Good support.  Liberta Gimpel  Daughter  [phone]    CURRENT CONCERNS Current Concerns  Post-Acute Placement   Other Concerns:    SOCIAL WORK ASSESSMENT / PLAN CSW-Intern spoke with Ms.Ratledge at bedside regarding short-term rehab after discharge. Ms.Mahr is agreeable and her preference is [facility]. Patient reported her family is wanting her to go, however she is agreeable as well. Ms. Lariccia also reported she has no one to look after her at home and feels a skilled nursing facility would be her best option at this point.   Assessment/plan status:  No Further Intervention Required Other assessment/ plan:   Information/referral to community resources:   None needed or requested at this time.    PATIENT'S/FAMILY'S RESPONSE TO PLAN OF CARE: Ms. Hurtado  was agreeable and open with speaking to CSW-Intern. Patient is aware that she is unable to care for herself at home and understands she needs the short-term rehab.       Gwenevere Abbot CSW-Intern [phone]

## 2014-06-01 NOTE — Progress Notes (Addendum)
PHARMACY - POST DIGIFAB  K 4 SCr down to 4.75 Digoxin level 7  Total serum digoxin concentrations will rise precipitously after administration of DigiFab due to the presence of the Fab-digoxin complex; because digoxin bound to Fab fragments cannot result in toxicity, this rise has no clinical meaning. Avoid monitoring total serum digoxin concentrations until the Fab fragments have been eliminated completely; this may be several days to weeks in patient with renal impairment. Recommend waiting at least a week with current renal function.  Check digoxin level 7 days after DigiFab - this has been ordered  [facility], Pharm.D., BCPS Clinical Pharmacist Pager: [phone] 06/01/2014 9:47 AM

## 2014-06-01 NOTE — Progress Notes (Signed)
Family Medicine Teaching Service Daily Progress Note Intern Pager: [phone]  Patient name: [name] Medical record number: [id number] Date of birth: [birth year] Age: 81 y.o. Gender: female  Primary Care Provider: Zigmund Gottron, MD Consultants: Palliative  Code Status: DNR  Pt Overview and Major Events to Date:  4/18: admitted with FFT, dig toxicity  Assessment and Plan: [name] is a 81 y.o. female presenting with dehydration, decreased PO intake, and acute on chronic kidney disease. PMH is significant for HFpEF, PAH, CKD 4, CAD, HOCM, DDD, CVA, Afib, HLD, T2DM, and history of melanoma. Also with digoxin toxicity   Dehydration/FTT/Anorexia. Patient with ~301b weight loss in the past 9 months. On admission she states she has no interest in eating or drinking, however she tells me she would be interested in trying Ensure.Marland Kitchen Has not taken diuretics for a week. No signs or symptoms of reversible etiologies (no dysphagia, dental pain, abdominal pain, chronic diarrhea, etc). Initially hypotensive to 82/42 however now hemodynamically stable.  - decrease  IVF rehydration to 100cc/hr (D5-NS) - Will add Ensure to diet, pt does not wish to meet with nutrition - Palliative care consult to discuss goals of care - DNR, desires NH placement, declines aggressive measures including SDU, ICU transfer and dialysis.  - AM cortisol level pending - PT/OT consulted  Digoxin toxicity: Level of 4.2 on admission, currently elevated to 7 (per pharmacy this rise has no clinical meaning as levels can rise precipitously after DigiFab dueto the presence of the Fab-digoxin complexes which cannot result in toxicity). Likely increased due to decreased renal function/dehydration. Not complaining any symptoms of toxicity, but has some electrolyte abnormalities consistent with toxicity on admission. May be contributing to patient's overall weakness.  - s/p 2 vials of digifab - continue to monitor BMETs for  hypokalemia due to concerns for intracellular movement. - No repeat EKGs now as pt is paced on telemetry  - will check an free digoxin level  AoCKD. Cr elevated to 5.72 on admission (baseline 2.7 to 3), currently 4.75.  Likely prerenal in the setting of dehydration - Pt very clear about not desiring to pursue HD at this time - Gentle IVF as above  - Continue to monitor Claritin and Sarna for uremic pruritus; will try to avoid hydroxyzine or Benadryl given age  Electrolyte Abnormalities. Hyperkalemic, hypochloremic. Likely mulitfactorial in setting of digoxin toxicity, dehydration, and AoCKD. - Continue to monitor - Hold kayexalate, no calcium as pt has a pacemaker  Depression. Possibly contributing to weakness/malaise - Continue home cymbalta  HFpEF/PAH. EF 55-60% in 2014. Per PCP, dry weight is likely in mid 120s.  - Continue coreg - Hold diuresis in setting of severe fluid overload  Afib. Recently changed from diltiazem to digoxin. Not an anticoagulation candidate due to history of GI bleed.  - Ventricular pacer in place with HR 60-80s  HTN - Continue coreg - will ad other home medications once BPs improve  T2DM Well controlled.  - Continue home lantus 5U daily - Will monitor glucose with BMP's in AM  HLD - Continue home atorvastatin  FEN/GI: Regular diet, D5NS @ 188ml/hr, MiraLax Prophylaxis: SubQ heparin  Disposition: Pt would like to go to nursing home  Subjective:  Patient doing well. Endorses some abdominal pain and itching all over. No N/V. Able to eat breakfast this AM. Otherwise doing well. Once again voices that she doesn't want dialysis.  Objective: Temp:  [97.5 F (36.4 C)-98 F (36.7 C)] 98 F (36.7 C) (04/19 0550) Pulse  Rate:  [54-98] 62 (04/19 0550) Resp:  [15-18] 15 (04/19 0550) BP: (94-129)/(42-85) 98/47 mmHg (04/19 0550) SpO2:  [93 %-100 %] 100 % (04/19 0550) Weight:  [116 lb 13.5 oz (53 kg)-117 lb 1 oz (53.1 kg)] 116 lb 13.5 oz (53 kg) (04/19  0249) Physical Exam: General: 81 year old, cachetic female in NAD sitting up in her hospital bed, appeared to eat her all her breakfast. HEENT: EOMI, MMM, oropharynx clear. Cardiovascular: RR, no murmurs, rubs, or gallops noted Respiratory: CTAB without wheezes or crackles. No increased WOB. Comfortable on RA Abdomen: +BS, soft, nondistended, slightly tender to palpation.  Extremities: Chronic venous stasis changes noted in LE, no edema noted  Skin: No rashes or areas of breakdown. No excoriations noted Neuro: Alert and conversational, no focal neurologic deficits  Laboratory:  Recent Labs Lab 05/31/14 1216 06/01/14 0705  WBC 7.1 4.8  HGB 9.0* 7.0*  HCT 26.8* 20.9*  PLT 130* 82*    Recent Labs Lab 05/26/14 1241 05/31/14 1216 06/01/14 0705  NA 133* 137 141  K 4.1 5.7* 4.0  CL 88* 95* 105  CO2 25 25 25   BUN 182* 174* 147*  CREATININE 5.95* 5.72* 4.75*  CALCIUM 9.8 9.8 8.6  PROT  --  7.4  --   BILITOT  --  0.9  --   ALKPHOS  --  71  --   ALT  --  16  --   AST  --  20  --   GLUCOSE 279* 138* 168*    BNP: 502.6 TSH 1.406 Digoxin level 4.2 >7.0  EKG: Ventricular paced rhythm   Archie Patten, MD 06/01/2014, 9:53 AM PGY-1, [place] Intern pager: [phone], text pages welcome

## 2014-06-01 NOTE — Progress Notes (Signed)
Patient UY:EBXI F [name]      DOB: Mar 09, 1930      DHW:[id number]   Palliative Medicine Team at [facility] Progress Note    Subjective: Still has some pruritis today but better. Occasional upset stomach. Appetite remains poor.      Filed Vitals:   06/01/14 1000  BP: 116/90  Pulse: 80  Temp: 98.2 F (36.8 C)  Resp: 16   Physical exam: GEN: alert, mild confusion CV: regular rate LUNGS: CTAB ABD: soft, ND EXT: warm  CBC    Component Value Date/Time   WBC 4.8 06/01/2014 0705   RBC 2.22* 06/01/2014 0705   RBC 2.78* 02/19/2012 0149   HGB 7.0* 06/01/2014 0705   HGB 8.3* 12/19/2012 0915   HCT 20.9* 06/01/2014 0705   PLT 82* 06/01/2014 0705   MCV 94.1 06/01/2014 0705   MCH 31.5 06/01/2014 0705   MCHC 33.5 06/01/2014 0705   RDW 14.9 06/01/2014 0705   LYMPHSABS 0.6* 05/31/2014 1216   MONOABS 0.7 05/31/2014 1216   EOSABS 0.1 05/31/2014 1216   BASOSABS 0.0 05/31/2014 1216    CMP     Component Value Date/Time   NA 141 06/01/2014 0705   K 4.0 06/01/2014 0705   CL 105 06/01/2014 0705   CO2 25 06/01/2014 0705   GLUCOSE 168* 06/01/2014 0705   BUN 147* 06/01/2014 0705   CREATININE 4.75* 06/01/2014 0705   CREATININE 4.05* 04/06/2014 1550   CALCIUM 8.6 06/01/2014 0705   PROT 7.4 05/31/2014 1216   ALBUMIN 3.4* 05/31/2014 1216   AST 20 05/31/2014 1216   ALT 16 05/31/2014 1216   ALKPHOS 71 05/31/2014 1216   BILITOT 0.9 05/31/2014 1216   GFRNONAA 8* 06/01/2014 0705   GFRAA 9* 06/01/2014 0705    Assessment and plan: 81 yo female with multiple medical problems as below, admitted with failure to thrive.  1. Code Status: DNR documented  2. GOC: See documentation from PCP Dr Andria Frames. SW working on completing paperwork for SNF/Rehab.  Renal function improving with hydration here. She may have some improvement with rehab but remains high risk for setbacks and clinical decline. I spoke with daughter Charlisha about this today.  We discussed that we will really need to watch to see  if she is making any improvement or if we are continuing to see decline in a more monitored environment.  She agrees that if we are getting to that point, a shift towards comfort care seems to make the most sense.  I tried talking with [name] (patient) today, but she has some at least mild encephalopathy. She does not remember conversation with Dr Andria Frames yesterday and really has a hard time talking about issues related to her kidneys/heart.  Ultimately, I think it would be best if outpatient palliative care could follow her at SNF and would recommend upon discharge. If continued to decline, it may make sense to shift her towards hospice/comfort care rather than repeated hospitalizations. I think how she does over coming weeks will help determine this.    3. Symptom Management:  1. FTT: see previous notes. Working on SNF/Rehab 2. Uremic Pruritis improved some today. On claritin. Improving BUN should help as well.   4. Psychosocial/Spiritual: Was living at home with her son [name]. Has 6 children in total. Daughter [name] also involved in her care at home.    Total Time: 25 minutes >50% of time spent in counseling and coordination of care regarding above.    Doran Clay D.O. Palliative Medicine Team  at [facility]  Pager: [phone] Team Phone: [phone]

## 2014-06-01 NOTE — Evaluation (Signed)
Physical Therapy Evaluation Patient Details Name: [name] MRN: [id number] DOB: 1930/04/01 Today's Date: 06/01/2014   History of Present Illness  81 yo female with PMHx of HOCM, HFpEF, AFib (not on anticaog 2/2 GI Bleed history), CKD who was admitted from cardiology office today with weakness, decreased mobility, little to no PO intake over the past few weeks. Also had recent fall at home. Family reportedly feels like they can no longer take care of her. [name] today states that things going poorly at home and she knows she has issues with her heart and kidneys. Appears upset at how things have been going at home but hard time elaborating for me today. With her dehydration and decreased PO intake, had her diuretics held last week. AKI on CKD here. Increased dig level. FTT and patient/family seeking nursing home placement.  Clinical Impression   Pt admitted with above diagnosis. Pt currently with functional limitations due to the deficits listed below (see PT Problem List).  Pt may benefit from skilled PT to increase their independence and safety with mobility to allow discharge to the venue listed below.    Pt seemed happy to be OOB and in the chair at end of our session;   Will follow Palliative Care Team's lead:  If mobility is congruent with [name]'s Goals of Care (and I did not get a good read on this during my eval), will be happy to continue to work with her;   If not, we will sign off.      Follow Up Recommendations SNF;Other (comment) (noted Obs status (so I'm not sure that SNF is a possiblity, and I'm not exactly sure what services hospice can provide; if unable to pursue SNF, then recommend 24 hour assist at home, and worth considering hospital bed)    [facility] bed;Wheelchair cushion (measurements PT)    Recommendations for Other Services Other (comment) (Noted Palliative Care following)     Precautions / Restrictions  Precautions Precautions: Fall      Mobility  Bed Mobility Overal bed mobility: Needs Assistance Bed Mobility: Supine to Sit     Supine to sit: Mod assist     General bed mobility comments: mod handheld assist to pull to sit; pt aboe to move bil LEs off of EOB without assist; Used bed pad to assist pt to square off hips at EOB  Transfers Overall transfer level: Needs assistance Equipment used: 1 person hand held assist Transfers: Sit to/from Stand Sit to Stand: Mod assist;+2 safety/equipment (second person assisted with hygeine (incontinent of urine))         General transfer comment: Mod assist to power up and remain steady while being cleaned   Ambulation/Gait Ambulation/Gait assistance: Min assist Ambulation Distance (Feet):  (a few small steps to chair) Assistive device: 1 person hand held assist Gait Pattern/deviations: Shuffle     General Gait Details: bil support given at elbows (shelf-arm technique); only a few steps bed to chair  Stairs            Wheelchair Mobility    Modified Rankin (Stroke Patients Only)       Balance Overall balance assessment: Needs assistance Sitting-balance support: Feet supported;Single extremity supported Sitting balance-Leahy Scale: Fair     Standing balance support: Bilateral upper extremity supported Standing balance-Leahy Scale: Poor  Pertinent Vitals/Pain Pain Assessment: No/denies pain    Home Living Family/patient expects to be discharged to:: Private residence Living Arrangements: Alone Available Help at Discharge: Other (Comment);Available PRN/intermittently (not sure how much assist is available to her) Type of Home: Mobile home Home Access: Stairs to enter Entrance Stairs-Rails: Right;Left Entrance Stairs-Number of Steps: 3 Home Layout: One level Home Equipment: Walker - 2 wheels;Cane - single point;Wheelchair - manual (per OT eval (over a year ago); home  O2) Additional Comments: Pt not very forthcoming with information; some of the above info was taken from an OT eval over a year ago     Prior Function Level of Independence: Needs assistance         Comments: Pt did not give specific information about PLOF, except that she "doesn't do anything"; therefore it stands to reason that she needed assist (not sure how much), it seems she did walk in house with assistive device (bed to living room, likely); noted family does grocery shopping and housework from OT eval over a year ago     Hand Dominance        Extremity/Trunk Assessment   Upper Extremity Assessment: Generalized weakness           Lower Extremity Assessment: Generalized weakness         Communication   Communication: No difficulties (pt did not talk much; at times not answering questions)  Cognition Arousal/Alertness: Awake/alert Behavior During Therapy: Flat affect Overall Cognitive Status: Difficult to assess (for simple mobility)                      General Comments      Exercises        Assessment/Plan    PT Assessment Patient needs continued PT services  PT Diagnosis Difficulty walking;Generalized weakness   PT Problem List Decreased strength;Decreased activity tolerance;Decreased balance;Decreased mobility;Decreased coordination;Decreased knowledge of use of DME;Decreased knowledge of precautions  PT Treatment Interventions DME instruction;Gait training;Stair training;Functional mobility training;Therapeutic activities;Therapeutic exercise;Balance training;Patient/family education   PT Goals (Current goals can be found in the Care Plan section) Acute Rehab PT Goals Patient Stated Goal: Pt did not indicate interest in setting therapy goals PT Goal Formulation: Patient unable to participate in goal setting Time For Goal Achievement: 06/15/14    Frequency Min 3X/week   Barriers to discharge Decreased caregiver support       Co-evaluation               End of Session   Activity Tolerance: Patient limited by fatigue Patient left: in chair;with call bell/phone within reach;with chair alarm set Nurse Communication: Mobility status    Functional Assessment Tool Used: Clinical Judgement Functional Limitation: Mobility: Walking and moving around Mobility: Walking and Moving Around Current Status (R4854): At least 20 percent but less than 40 percent impaired, limited or restricted Mobility: Walking and Moving Around Goal Status [phone]): 0 percent impaired, limited or restricted    Time: 5009-3818 PT Time Calculation (min) (ACUTE ONLY): 19 min   Charges:   PT Evaluation $Initial PT Evaluation Tier I: 1 Procedure     PT G Codes:   PT G-Codes **NOT FOR INPATIENT CLASS** Functional Assessment Tool Used: Clinical Judgement Functional Limitation: Mobility: Walking and moving around Mobility: Walking and Moving Around Current Status (E9937): At least 20 percent but less than 40 percent impaired, limited or restricted Mobility: Walking and Moving Around Goal Status [phone]): 0 percent impaired, limited or restricted    Roney Marion [facility] 06/01/2014,  10:09 AM  Roney Marion, PT  Acute Rehabilitation Services Pager [phone] Office [phone]

## 2014-06-01 NOTE — Progress Notes (Signed)
CRITICAL VALUE ALERT  Critical value received:  Digoxin = 7.0  Date of notification:  06-01-2014  Time of notification:  09:29  Critical value read back:Yes.    Nurse who received alert:  Genelle Bal, RN  MD notified (1st page):  Family Medicine Teaching Service (McDiadmid = Attending)  Time of first page:  09:30  MD notified (2nd page):  Time of second page:  Responding MD:  Barbaraann Rondo  Time MD responded:  09:37

## 2014-06-02 DIAGNOSIS — I4891 Unspecified atrial fibrillation: Secondary | ICD-10-CM | POA: Diagnosis not present

## 2014-06-02 DIAGNOSIS — N184 Chronic kidney disease, stage 4 (severe): Secondary | ICD-10-CM

## 2014-06-02 DIAGNOSIS — E785 Hyperlipidemia, unspecified: Secondary | ICD-10-CM | POA: Diagnosis not present

## 2014-06-02 DIAGNOSIS — D649 Anemia, unspecified: Secondary | ICD-10-CM | POA: Diagnosis not present

## 2014-06-02 DIAGNOSIS — E1140 Type 2 diabetes mellitus with diabetic neuropathy, unspecified: Secondary | ICD-10-CM | POA: Diagnosis not present

## 2014-06-02 DIAGNOSIS — R627 Adult failure to thrive: Secondary | ICD-10-CM | POA: Diagnosis not present

## 2014-06-02 DIAGNOSIS — I129 Hypertensive chronic kidney disease with stage 1 through stage 4 chronic kidney disease, or unspecified chronic kidney disease: Secondary | ICD-10-CM | POA: Diagnosis not present

## 2014-06-02 DIAGNOSIS — R300 Dysuria: Secondary | ICD-10-CM | POA: Diagnosis not present

## 2014-06-02 DIAGNOSIS — F329 Major depressive disorder, single episode, unspecified: Secondary | ICD-10-CM | POA: Diagnosis not present

## 2014-06-02 DIAGNOSIS — Z794 Long term (current) use of insulin: Secondary | ICD-10-CM | POA: Diagnosis not present

## 2014-06-02 DIAGNOSIS — N189 Chronic kidney disease, unspecified: Secondary | ICD-10-CM | POA: Diagnosis not present

## 2014-06-02 DIAGNOSIS — R630 Anorexia: Secondary | ICD-10-CM | POA: Diagnosis not present

## 2014-06-02 DIAGNOSIS — T460X5D Adverse effect of cardiac-stimulant glycosides and drugs of similar action, subsequent encounter: Secondary | ICD-10-CM | POA: Diagnosis not present

## 2014-06-02 LAB — BASIC METABOLIC PANEL
Anion gap: 13 (ref 5–15)
BUN: 126 mg/dL — ABNORMAL HIGH (ref 6–23)
CO2: 22 mmol/L (ref 19–32)
Calcium: 8.9 mg/dL (ref 8.4–10.5)
Chloride: 106 mmol/L (ref 96–112)
Creatinine, Ser: 4.27 mg/dL — ABNORMAL HIGH (ref 0.50–1.10)
GFR calc Af Amer: 10 mL/min — ABNORMAL LOW (ref >90)
GFR calc non Af Amer: 9 mL/min — ABNORMAL LOW (ref >90)
Glucose, Bld: 128 mg/dL — ABNORMAL HIGH (ref 70–99)
Potassium: 3.9 mmol/L (ref 3.5–5.1)
SODIUM: 141 mmol/L (ref 135–145)

## 2014-06-02 LAB — CORTISOL-AM, BLOOD: Cortisol - AM: 20.1 ug/dL (ref 4.3–22.4)

## 2014-06-02 LAB — GLUCOSE, CAPILLARY: Glucose-Capillary: 141 mg/dL — ABNORMAL HIGH (ref 70–99)

## 2014-06-02 MED ORDER — LORATADINE 10 MG PO TABS: 10.0000 mg | ORAL_TABLET | ORAL | Status: AC

## 2014-06-02 MED ORDER — ENSURE ENLIVE PO LIQD: 237.0000 mL | Freq: Two times a day (BID) | ORAL | Status: AC

## 2014-06-02 NOTE — Progress Notes (Signed)
Occupational Therapy Treatment Patient Details Name: [name] MRN: [id number] DOB: [birth year] Today's Date: 06/02/2014       OT comments  Urine smells very strong  Follow Up Recommendations  SNF;Supervision/Assistance - 24 hour          Precautions / Restrictions Precautions Precautions: Fall Precaution Comments: recent fall at home.       Mobility Bed Mobility Overal bed mobility: Needs Assistance Bed Mobility: Supine to Sit     Supine to sit: Mod assist        Transfers Overall transfer level: Needs assistance Equipment used: 1 person hand held assist Transfers: Sit to/from Stand;Stand Pivot Transfers Sit to Stand: Mod assist Stand pivot transfers: Mod assist                ADL                           Toilet Transfer: Maximal assistance;Squat-pivot;BSC;+2 for safety/equipment   Toileting- Clothing Manipulation and Hygiene: Total assistance;Sit to/from stand;+2 for safety/equipment Toileting - Clothing Manipulation Details (indicate cue type and reason): pt not able to help with hygiene this day     Functional mobility during ADLs: Maximal assistance General ADL Comments: pt needed significant A this day.  Pt had BM in bed and wanted to get to [facility] but did not have an success.  CNA  Aed pt clean pt.                 Cognition   Behavior During Therapy: Flat affect Overall Cognitive Status: Difficult to assess                               General Comments  pt needed increased A this day.  Pt has not eaten all day and was in bed with BM and had not called anyone to tell then she was dirty.             Frequency Min 2X/week        Plan Discharge plan remains appropriate       End of Session Equipment Utilized During Treatment: Oxygen   Activity Tolerance Patient limited by fatigue   Patient Left in bed;with nursing/sitter in room;with call bell/phone within reach           Time: 1320-1345 OT Time  Calculation (min): 25 min  Charges: OT General Charges $OT Visit: 1 Procedure OT Treatments $Self Care/Home Management : 23-37 mins  Cierra Rothgeb, Thereasa Parkin 06/02/2014, 1:53 PM

## 2014-06-02 NOTE — Progress Notes (Signed)
Report given to charge nurse at [facility].

## 2014-06-02 NOTE — Clinical Documentation Improvement (Signed)
The patient has a history of anemia of chronic disease noted. Please clarify if anemia is present on this admission.  . Documentation of Anemia should include the type of anemia: --Nutritional --Anemia of Chronic Disease --Due to blood loss --Other (please specify)  . Link any laboratory findings to a related diagnosis (if appropriate) . Document any associated diagnoses/conditions  Supporting Information: Component      Hemoglobin HCT  Latest Ref Rng      12.0 - 15.0 g/dL 36.0 - 46.0 %  05/31/2014      9.0 (L) 26.8 (L)  06/01/2014      7.0 (L) 20.9 (L)     Thank you for your time with this!   Almon Register, RN Clinical Documentation Improvement Specialist ([phone] / [phone]

## 2014-06-02 NOTE — Discharge Instructions (Signed)
You were admitted to the hospital with dehydration. We gave you IV fluids while here. You will be going to a nursing home. While here, we found that you had too much digoxin in your blood, and we stopped that medication. You should have repeat levels drawn in 1 week. We also stopped a few of your blood pressure medications. It is important that you follow up with your primary doctor to restart these medications as needed.

## 2014-06-02 NOTE — Progress Notes (Signed)
Family Medicine Teaching Service Daily Progress Note Intern Pager: [phone]  Patient name: [name] Medical record number: [id number] Date of birth: 1930/07/18 Age: 81 y.o. Gender: female  Primary Care Provider: Zigmund Gottron, MD Consultants: Palliative  Code Status: DNR  Pt Overview and Major Events to Date:  4/18: admitted with FFT, dig toxicity  Assessment and Plan: [name] is a 81 y.o. female presenting with dehydration, decreased PO intake, and acute on chronic kidney disease. PMH is significant for HFpEF, PAH, CKD 4, CAD, HOCM, DDD, CVA, Afib, HLD, T2DM, and history of melanoma. Also with digoxin toxicity.  Dehydration/FTT/Anorexia. Patient with ~30 pound weight loss in the past 9 months. No interest in eating or drinking. No signs or symptoms of reversible etiologies (no dysphagia, dental pain, abdominal pain, chronic diarrhea, etc). Hemodynamically stable. - Gentle IV hydration - Will add Ensure to diet, pt does not wish to meet with nutrition - Palliative care consult to discuss goals of care - DNR, desires NH placement, declines aggressive measures including SDU, ICU transfer and dialysis.  - AM cortisol level pending - PT/OT consulted - Recommend SNF  Digoxin toxicity: Level of 4.2 on admission. Likely increased due to decreased renal function/dehydration.  May be contributing to patient's overall weakness, otherwise no symptoms of toxicity. - s/p 2 vials of digifab - continue to monitor BMETs - No repeat EKGs now as pt is paced on telemetry  - Consider repeating total digoxin level in 1 week (will be increased in short term due to digifab administration)  AoCKD. Cr elevated to 5.72 on admission (baseline 2.7 to 3). Likely prerenal in the setting of dehydration. Improving - Pt very clear about not desiring to pursue HD at this time - Gentle IVF as above  - Continue to monitor - Claritin and Sarna for uremic pruritus; will try to avoid hydroxyzine or  Benadryl given age  Depression. Possibly contributing to weakness/malaise - Continue home cymbalta  Dysuria. Patient reports burning sensation for past 3 weeks. - Will check UA  HFpEF/PAH. EF 55-60% in 2014. Per PCP, dry weight is likely in mid 120s.  - Continue coreg - Hold diuresis in setting of severe dehydration  Afib. Recently changed from diltiazem to digoxin. Not an anticoagulation candidate due to history of GI bleed.  - Ventricular pacer in place with HR 60-80s  HTN - Continue coreg - will ad other home medications once BPs improve  T2DM Well controlled.  - Continue home lantus 5U daily  HLD - Continue home atorvastatin  FEN/GI: Regular diet, D5NS @ 177ml/hr, MiraLax Prophylaxis: SubQ heparin  Disposition: Admitted to telemetry. Stable for discharge to SNF when bed available.   Subjective:  Doing well this morning. Complains of burning sensation "in bottom" for the past 3 weeks. No other complaints.   Objective: Temp:  [98.2 F (36.8 C)-99.4 F (37.4 C)] 98.9 F (37.2 C) (04/20 0600) Pulse Rate:  [60-80] 61 (04/20 0600) Resp:  [16-18] 17 (04/20 0600) BP: (90-116)/(43-90) 90/47 mmHg (04/20 0600) SpO2:  [95 %-100 %] 98 % (04/20 0600) Weight:  [117 lb 14.1 oz (53.47 kg)] 117 lb 14.1 oz (53.47 kg) (04/19 2140) Physical Exam: General: 81 year old, cachetic female in NAD sitting up in her hospital bed, eating breakfast HEENT: EOMI, MMM, oropharynx clear. Cardiovascular: RR, no murmurs, rubs, or gallops noted Respiratory: CTAB without wheezes or crackles. No increased WOB. Comfortable on RA Abdomen: +BS, soft, nondistended, slightly tender to palpation.  Extremities: Chronic venous stasis changes noted in  LE, no edema noted  Skin: No rashes or areas of breakdown. No excoriations noted Neuro: Alert and conversational, no focal neurologic deficits  Laboratory:  Recent Labs Lab 05/31/14 1216 06/01/14 0705  WBC 7.1 4.8  HGB 9.0* 7.0*  HCT 26.8* 20.9*  PLT  130* 82*    Recent Labs Lab 05/31/14 1216 06/01/14 0705 06/02/14 0525  NA 137 141 141  K 5.7* 4.0 3.9  CL 95* 105 106  CO2 25 25 22   BUN 174* 147* 126*  CREATININE 5.72* 4.75* 4.27*  CALCIUM 9.8 8.6 8.9  PROT 7.4  --   --   BILITOT 0.9  --   --   ALKPHOS 71  --   --   ALT 16  --   --   AST 20  --   --   GLUCOSE 138* 168* 128*    Caleb Burnetta Sabin, MD 06/02/2014, 6:59 AM PGY-1, [facility] Intern pager: [phone], text pages welcome

## 2014-06-03 ENCOUNTER — Telehealth: Payer: Self-pay | Admitting: Family Medicine

## 2014-06-03 NOTE — Telephone Encounter (Signed)
Received call from [facility] this morning around 7:45am, reporting that patient was found deceased around 7:30am. She had just been admitted to [facility] yesterday PM and our geriatrics team was planning to see her this morning.  Gave verbal order to release body to funeral home. Informed PCP Dr. Andria Frames as well as geriatrics attending Dr. Ree Kida verbally.  Leeanne Rio, MD

## 2014-06-09 ENCOUNTER — Ambulatory Visit: Payer: Medicare Other | Admitting: Family Medicine

## 2014-06-11 DIAGNOSIS — I2510 Atherosclerotic heart disease of native coronary artery without angina pectoris: Secondary | ICD-10-CM | POA: Diagnosis not present

## 2014-06-11 DIAGNOSIS — I5032 Chronic diastolic (congestive) heart failure: Secondary | ICD-10-CM | POA: Diagnosis not present

## 2014-06-13 DEATH — deceased

## 2014-06-23 ENCOUNTER — Ambulatory Visit: Payer: Medicare Other | Admitting: Internal Medicine

## 2014-07-01 ENCOUNTER — Ambulatory Visit: Payer: Medicare Other | Admitting: Cardiology
